# Patient Record
Sex: Male | Born: 1940 | Race: White | Hispanic: No | Marital: Single | Smoking: Former smoker
Health system: Southern US, Community
[De-identification: ages and names within clinical notes are randomized; demographics above are authoritative.]

## PROBLEM LIST (undated history)

## (undated) DIAGNOSIS — I6529 Occlusion and stenosis of unspecified carotid artery: Secondary | ICD-10-CM

## (undated) DIAGNOSIS — I252 Old myocardial infarction: Secondary | ICD-10-CM

## (undated) DIAGNOSIS — F32A Depression, unspecified: Secondary | ICD-10-CM

## (undated) DIAGNOSIS — I739 Peripheral vascular disease, unspecified: Secondary | ICD-10-CM

## (undated) DIAGNOSIS — I4891 Unspecified atrial fibrillation: Secondary | ICD-10-CM

## (undated) DIAGNOSIS — N183 Chronic kidney disease, stage 3 unspecified: Secondary | ICD-10-CM

## (undated) DIAGNOSIS — I2510 Atherosclerotic heart disease of native coronary artery without angina pectoris: Secondary | ICD-10-CM

## (undated) DIAGNOSIS — I82409 Acute embolism and thrombosis of unspecified deep veins of unspecified lower extremity: Secondary | ICD-10-CM

## (undated) DIAGNOSIS — G629 Polyneuropathy, unspecified: Secondary | ICD-10-CM

## (undated) DIAGNOSIS — M79673 Pain in unspecified foot: Secondary | ICD-10-CM

## (undated) DIAGNOSIS — Z720 Tobacco use: Secondary | ICD-10-CM

## (undated) DIAGNOSIS — Z862 Personal history of diseases of the blood and blood-forming organs and certain disorders involving the immune mechanism: Secondary | ICD-10-CM

## (undated) DIAGNOSIS — K635 Polyp of colon: Secondary | ICD-10-CM

## (undated) DIAGNOSIS — F329 Major depressive disorder, single episode, unspecified: Secondary | ICD-10-CM

## (undated) DIAGNOSIS — I10 Essential (primary) hypertension: Secondary | ICD-10-CM

## (undated) DIAGNOSIS — K8050 Calculus of bile duct without cholangitis or cholecystitis without obstruction: Secondary | ICD-10-CM

## (undated) DIAGNOSIS — J449 Chronic obstructive pulmonary disease, unspecified: Secondary | ICD-10-CM

## (undated) DIAGNOSIS — R001 Bradycardia, unspecified: Secondary | ICD-10-CM

## (undated) DIAGNOSIS — E785 Hyperlipidemia, unspecified: Secondary | ICD-10-CM

## (undated) DIAGNOSIS — N189 Chronic kidney disease, unspecified: Secondary | ICD-10-CM

## (undated) DIAGNOSIS — H269 Unspecified cataract: Secondary | ICD-10-CM

## (undated) DIAGNOSIS — K219 Gastro-esophageal reflux disease without esophagitis: Secondary | ICD-10-CM

## (undated) DIAGNOSIS — G709 Myoneural disorder, unspecified: Secondary | ICD-10-CM

## (undated) DIAGNOSIS — I639 Cerebral infarction, unspecified: Secondary | ICD-10-CM

## (undated) DIAGNOSIS — F419 Anxiety disorder, unspecified: Secondary | ICD-10-CM

## (undated) DIAGNOSIS — Z87438 Personal history of other diseases of male genital organs: Secondary | ICD-10-CM

## (undated) HISTORY — DX: Personal history of other diseases of male genital organs: Z87.438

## (undated) HISTORY — DX: Tobacco use: Z72.0

## (undated) HISTORY — DX: Cerebral infarction, unspecified: I63.9

## (undated) HISTORY — PX: OTHER SURGICAL HISTORY: SHX169

## (undated) HISTORY — DX: Essential (primary) hypertension: I10

## (undated) HISTORY — DX: Bradycardia, unspecified: R00.1

## (undated) HISTORY — DX: Calculus of bile duct without cholangitis or cholecystitis without obstruction: K80.50

## (undated) HISTORY — DX: Polyneuropathy, unspecified: G62.9

## (undated) HISTORY — DX: Polyp of colon: K63.5

## (undated) HISTORY — DX: Acute embolism and thrombosis of unspecified deep veins of unspecified lower extremity: I82.409

## (undated) HISTORY — DX: Major depressive disorder, single episode, unspecified: F32.9

## (undated) HISTORY — DX: Atherosclerotic heart disease of native coronary artery without angina pectoris: I25.10

## (undated) HISTORY — PX: CHOLECYSTECTOMY: SHX55

## (undated) HISTORY — DX: Unspecified atrial fibrillation: I48.91

## (undated) HISTORY — PX: COLONOSCOPY: SHX174

## (undated) HISTORY — DX: Myoneural disorder, unspecified: G70.9

## (undated) HISTORY — PX: BYPASS GRAFT: SHX909

## (undated) HISTORY — DX: Occlusion and stenosis of unspecified carotid artery: I65.29

## (undated) HISTORY — DX: Unspecified cataract: H26.9

## (undated) HISTORY — DX: Gastro-esophageal reflux disease without esophagitis: K21.9

## (undated) HISTORY — DX: Depression, unspecified: F32.A

## (undated) HISTORY — DX: Hyperlipidemia, unspecified: E78.5

## (undated) HISTORY — DX: Personal history of diseases of the blood and blood-forming organs and certain disorders involving the immune mechanism: Z86.2

## (undated) HISTORY — PX: ANKLE SURGERY: SHX546

## (undated) HISTORY — DX: Anxiety disorder, unspecified: F41.9

---

## 1997-06-05 ENCOUNTER — Encounter: Admission: RE | Admit: 1997-06-05 | Discharge: 1997-06-05 | Payer: Self-pay | Admitting: Family Medicine

## 1997-06-19 ENCOUNTER — Encounter: Admission: RE | Admit: 1997-06-19 | Discharge: 1997-06-19 | Payer: Self-pay | Admitting: Family Medicine

## 1997-07-03 ENCOUNTER — Encounter: Admission: RE | Admit: 1997-07-03 | Discharge: 1997-07-03 | Payer: Self-pay | Admitting: Sports Medicine

## 1997-07-11 ENCOUNTER — Encounter: Admission: RE | Admit: 1997-07-11 | Discharge: 1997-07-11 | Payer: Self-pay | Admitting: Family Medicine

## 1997-07-25 ENCOUNTER — Encounter: Admission: RE | Admit: 1997-07-25 | Discharge: 1997-07-25 | Payer: Self-pay | Admitting: Family Medicine

## 1997-08-08 ENCOUNTER — Encounter: Admission: RE | Admit: 1997-08-08 | Discharge: 1997-08-08 | Payer: Self-pay | Admitting: Family Medicine

## 1997-08-22 ENCOUNTER — Encounter: Admission: RE | Admit: 1997-08-22 | Discharge: 1997-08-22 | Payer: Self-pay | Admitting: Family Medicine

## 1997-08-31 ENCOUNTER — Encounter: Admission: RE | Admit: 1997-08-31 | Discharge: 1997-08-31 | Payer: Self-pay | Admitting: Family Medicine

## 1997-09-05 ENCOUNTER — Encounter: Admission: RE | Admit: 1997-09-05 | Discharge: 1997-09-05 | Payer: Self-pay | Admitting: Family Medicine

## 1997-09-12 ENCOUNTER — Encounter: Admission: RE | Admit: 1997-09-12 | Discharge: 1997-09-12 | Payer: Self-pay | Admitting: Family Medicine

## 1997-09-19 ENCOUNTER — Encounter: Admission: RE | Admit: 1997-09-19 | Discharge: 1997-09-19 | Payer: Self-pay | Admitting: Family Medicine

## 1997-10-03 ENCOUNTER — Encounter: Admission: RE | Admit: 1997-10-03 | Discharge: 1997-10-03 | Payer: Self-pay | Admitting: Family Medicine

## 1997-10-10 ENCOUNTER — Encounter: Admission: RE | Admit: 1997-10-10 | Discharge: 1997-10-10 | Payer: Self-pay | Admitting: Family Medicine

## 1997-10-17 ENCOUNTER — Encounter: Admission: RE | Admit: 1997-10-17 | Discharge: 1997-10-17 | Payer: Self-pay | Admitting: Family Medicine

## 1997-10-24 ENCOUNTER — Encounter: Admission: RE | Admit: 1997-10-24 | Discharge: 1997-10-24 | Payer: Self-pay | Admitting: Family Medicine

## 1997-10-31 ENCOUNTER — Encounter: Admission: RE | Admit: 1997-10-31 | Discharge: 1997-10-31 | Payer: Self-pay | Admitting: Family Medicine

## 1997-11-14 ENCOUNTER — Encounter: Admission: RE | Admit: 1997-11-14 | Discharge: 1997-11-14 | Payer: Self-pay | Admitting: Family Medicine

## 1997-11-28 ENCOUNTER — Encounter: Admission: RE | Admit: 1997-11-28 | Discharge: 1997-11-28 | Payer: Self-pay | Admitting: Family Medicine

## 1997-12-12 ENCOUNTER — Encounter: Admission: RE | Admit: 1997-12-12 | Discharge: 1997-12-12 | Payer: Self-pay | Admitting: Family Medicine

## 1997-12-19 ENCOUNTER — Encounter: Admission: RE | Admit: 1997-12-19 | Discharge: 1997-12-19 | Payer: Self-pay | Admitting: Family Medicine

## 1997-12-26 ENCOUNTER — Encounter: Admission: RE | Admit: 1997-12-26 | Discharge: 1997-12-26 | Payer: Self-pay | Admitting: Family Medicine

## 1998-01-01 ENCOUNTER — Encounter: Admission: RE | Admit: 1998-01-01 | Discharge: 1998-01-01 | Payer: Self-pay | Admitting: Family Medicine

## 1998-01-08 ENCOUNTER — Encounter: Admission: RE | Admit: 1998-01-08 | Discharge: 1998-01-08 | Payer: Self-pay | Admitting: Sports Medicine

## 1998-01-15 ENCOUNTER — Encounter: Admission: RE | Admit: 1998-01-15 | Discharge: 1998-01-15 | Payer: Self-pay | Admitting: Sports Medicine

## 1998-01-23 ENCOUNTER — Encounter: Admission: RE | Admit: 1998-01-23 | Discharge: 1998-01-23 | Payer: Self-pay | Admitting: Family Medicine

## 1998-01-30 ENCOUNTER — Encounter: Admission: RE | Admit: 1998-01-30 | Discharge: 1998-01-30 | Payer: Self-pay | Admitting: Family Medicine

## 1998-02-04 ENCOUNTER — Encounter: Admission: RE | Admit: 1998-02-04 | Discharge: 1998-02-04 | Payer: Self-pay | Admitting: Family Medicine

## 1998-02-11 ENCOUNTER — Encounter: Admission: RE | Admit: 1998-02-11 | Discharge: 1998-02-11 | Payer: Self-pay | Admitting: Sports Medicine

## 1998-03-04 ENCOUNTER — Encounter: Admission: RE | Admit: 1998-03-04 | Discharge: 1998-03-04 | Payer: Self-pay | Admitting: Family Medicine

## 1998-03-11 ENCOUNTER — Encounter: Admission: RE | Admit: 1998-03-11 | Discharge: 1998-03-11 | Payer: Self-pay | Admitting: Family Medicine

## 1998-03-26 ENCOUNTER — Encounter: Admission: RE | Admit: 1998-03-26 | Discharge: 1998-03-26 | Payer: Self-pay | Admitting: Family Medicine

## 1998-04-03 ENCOUNTER — Encounter: Admission: RE | Admit: 1998-04-03 | Discharge: 1998-04-03 | Payer: Self-pay | Admitting: Family Medicine

## 1998-04-17 ENCOUNTER — Encounter: Admission: RE | Admit: 1998-04-17 | Discharge: 1998-04-17 | Payer: Self-pay | Admitting: Family Medicine

## 1998-05-09 ENCOUNTER — Encounter: Admission: RE | Admit: 1998-05-09 | Discharge: 1998-05-09 | Payer: Self-pay | Admitting: Family Medicine

## 1998-05-29 ENCOUNTER — Encounter: Admission: RE | Admit: 1998-05-29 | Discharge: 1998-05-29 | Payer: Self-pay | Admitting: Family Medicine

## 1998-06-05 ENCOUNTER — Encounter: Admission: RE | Admit: 1998-06-05 | Discharge: 1998-06-05 | Payer: Self-pay | Admitting: Family Medicine

## 1998-06-24 ENCOUNTER — Encounter: Admission: RE | Admit: 1998-06-24 | Discharge: 1998-06-24 | Payer: Self-pay | Admitting: Family Medicine

## 1998-07-08 ENCOUNTER — Encounter: Admission: RE | Admit: 1998-07-08 | Discharge: 1998-07-08 | Payer: Self-pay | Admitting: Family Medicine

## 1998-07-29 ENCOUNTER — Encounter: Admission: RE | Admit: 1998-07-29 | Discharge: 1998-07-29 | Payer: Self-pay | Admitting: Family Medicine

## 1998-08-19 ENCOUNTER — Encounter: Admission: RE | Admit: 1998-08-19 | Discharge: 1998-08-19 | Payer: Self-pay | Admitting: Family Medicine

## 1998-09-02 ENCOUNTER — Encounter: Admission: RE | Admit: 1998-09-02 | Discharge: 1998-09-02 | Payer: Self-pay | Admitting: Family Medicine

## 1998-09-09 ENCOUNTER — Encounter: Admission: RE | Admit: 1998-09-09 | Discharge: 1998-09-09 | Payer: Self-pay | Admitting: Family Medicine

## 1998-09-12 ENCOUNTER — Ambulatory Visit (HOSPITAL_COMMUNITY): Admission: RE | Admit: 1998-09-12 | Discharge: 1998-09-12 | Payer: Self-pay | Admitting: *Deleted

## 1998-09-23 ENCOUNTER — Encounter: Admission: RE | Admit: 1998-09-23 | Discharge: 1998-09-23 | Payer: Self-pay | Admitting: Family Medicine

## 1998-10-08 ENCOUNTER — Encounter: Admission: RE | Admit: 1998-10-08 | Discharge: 1998-10-08 | Payer: Self-pay | Admitting: Sports Medicine

## 1998-10-14 ENCOUNTER — Encounter: Admission: RE | Admit: 1998-10-14 | Discharge: 1998-10-14 | Payer: Self-pay | Admitting: Family Medicine

## 1998-10-29 ENCOUNTER — Encounter: Admission: RE | Admit: 1998-10-29 | Discharge: 1998-10-29 | Payer: Self-pay | Admitting: Sports Medicine

## 1998-11-04 ENCOUNTER — Encounter: Admission: RE | Admit: 1998-11-04 | Discharge: 1998-11-04 | Payer: Self-pay | Admitting: Family Medicine

## 1998-11-12 ENCOUNTER — Encounter: Admission: RE | Admit: 1998-11-12 | Discharge: 1998-11-12 | Payer: Self-pay | Admitting: Sports Medicine

## 1998-11-15 ENCOUNTER — Encounter: Admission: RE | Admit: 1998-11-15 | Discharge: 1998-11-15 | Payer: Self-pay | Admitting: Family Medicine

## 1998-11-18 ENCOUNTER — Encounter: Admission: RE | Admit: 1998-11-18 | Discharge: 1998-11-18 | Payer: Self-pay | Admitting: Family Medicine

## 1998-11-22 ENCOUNTER — Encounter: Admission: RE | Admit: 1998-11-22 | Discharge: 1998-11-22 | Payer: Self-pay | Admitting: Family Medicine

## 1998-11-29 ENCOUNTER — Encounter: Admission: RE | Admit: 1998-11-29 | Discharge: 1998-11-29 | Payer: Self-pay | Admitting: Family Medicine

## 1998-12-20 ENCOUNTER — Encounter: Admission: RE | Admit: 1998-12-20 | Discharge: 1998-12-20 | Payer: Self-pay | Admitting: Sports Medicine

## 1998-12-23 ENCOUNTER — Encounter: Admission: RE | Admit: 1998-12-23 | Discharge: 1998-12-23 | Payer: Self-pay | Admitting: Family Medicine

## 1999-01-13 ENCOUNTER — Encounter: Admission: RE | Admit: 1999-01-13 | Discharge: 1999-01-13 | Payer: Self-pay | Admitting: Family Medicine

## 1999-01-20 ENCOUNTER — Encounter: Admission: RE | Admit: 1999-01-20 | Discharge: 1999-01-20 | Payer: Self-pay | Admitting: Family Medicine

## 1999-01-27 ENCOUNTER — Encounter: Admission: RE | Admit: 1999-01-27 | Discharge: 1999-01-27 | Payer: Self-pay | Admitting: Family Medicine

## 1999-02-11 ENCOUNTER — Encounter: Admission: RE | Admit: 1999-02-11 | Discharge: 1999-02-11 | Payer: Self-pay | Admitting: Sports Medicine

## 1999-03-03 ENCOUNTER — Encounter: Admission: RE | Admit: 1999-03-03 | Discharge: 1999-03-03 | Payer: Self-pay | Admitting: Family Medicine

## 1999-03-04 ENCOUNTER — Encounter: Admission: RE | Admit: 1999-03-04 | Discharge: 1999-03-04 | Payer: Self-pay | Admitting: Sports Medicine

## 1999-03-31 ENCOUNTER — Encounter: Admission: RE | Admit: 1999-03-31 | Discharge: 1999-03-31 | Payer: Self-pay | Admitting: Family Medicine

## 1999-04-28 ENCOUNTER — Encounter: Admission: RE | Admit: 1999-04-28 | Discharge: 1999-04-28 | Payer: Self-pay | Admitting: Family Medicine

## 1999-05-26 ENCOUNTER — Encounter: Admission: RE | Admit: 1999-05-26 | Discharge: 1999-05-26 | Payer: Self-pay | Admitting: Family Medicine

## 1999-06-02 ENCOUNTER — Encounter: Admission: RE | Admit: 1999-06-02 | Discharge: 1999-06-02 | Payer: Self-pay | Admitting: Family Medicine

## 1999-06-16 ENCOUNTER — Encounter: Admission: RE | Admit: 1999-06-16 | Discharge: 1999-06-16 | Payer: Self-pay | Admitting: Family Medicine

## 1999-06-18 ENCOUNTER — Encounter: Admission: RE | Admit: 1999-06-18 | Discharge: 1999-06-18 | Payer: Self-pay | Admitting: Family Medicine

## 1999-07-14 ENCOUNTER — Encounter: Admission: RE | Admit: 1999-07-14 | Discharge: 1999-07-14 | Payer: Self-pay | Admitting: Family Medicine

## 1999-07-23 ENCOUNTER — Ambulatory Visit: Admission: RE | Admit: 1999-07-23 | Discharge: 1999-07-23 | Payer: Self-pay | Admitting: Family Medicine

## 1999-07-23 ENCOUNTER — Encounter: Admission: RE | Admit: 1999-07-23 | Discharge: 1999-07-23 | Payer: Self-pay | Admitting: Family Medicine

## 1999-08-06 ENCOUNTER — Encounter: Admission: RE | Admit: 1999-08-06 | Discharge: 1999-08-06 | Payer: Self-pay | Admitting: Family Medicine

## 1999-08-22 ENCOUNTER — Encounter: Admission: RE | Admit: 1999-08-22 | Discharge: 1999-08-22 | Payer: Self-pay | Admitting: Family Medicine

## 1999-09-05 ENCOUNTER — Encounter: Admission: RE | Admit: 1999-09-05 | Discharge: 1999-09-05 | Payer: Self-pay | Admitting: Family Medicine

## 1999-09-23 ENCOUNTER — Encounter: Payer: Self-pay | Admitting: Family Medicine

## 1999-09-23 ENCOUNTER — Ambulatory Visit (HOSPITAL_COMMUNITY): Admission: RE | Admit: 1999-09-23 | Discharge: 1999-09-23 | Payer: Self-pay | Admitting: Family Medicine

## 1999-09-30 ENCOUNTER — Encounter: Admission: RE | Admit: 1999-09-30 | Discharge: 1999-09-30 | Payer: Self-pay | Admitting: Family Medicine

## 1999-10-29 ENCOUNTER — Encounter: Admission: RE | Admit: 1999-10-29 | Discharge: 1999-10-29 | Payer: Self-pay | Admitting: Family Medicine

## 1999-11-06 ENCOUNTER — Ambulatory Visit (HOSPITAL_COMMUNITY): Admission: RE | Admit: 1999-11-06 | Discharge: 1999-11-06 | Payer: Self-pay | Admitting: Orthopedic Surgery

## 1999-11-27 ENCOUNTER — Encounter: Admission: RE | Admit: 1999-11-27 | Discharge: 1999-11-27 | Payer: Self-pay | Admitting: Family Medicine

## 1999-12-04 ENCOUNTER — Encounter: Admission: RE | Admit: 1999-12-04 | Discharge: 1999-12-04 | Payer: Self-pay | Admitting: Family Medicine

## 2000-01-06 ENCOUNTER — Encounter: Admission: RE | Admit: 2000-01-06 | Discharge: 2000-01-06 | Payer: Self-pay | Admitting: Sports Medicine

## 2000-01-13 ENCOUNTER — Encounter: Admission: RE | Admit: 2000-01-13 | Discharge: 2000-01-13 | Payer: Self-pay | Admitting: Sports Medicine

## 2000-02-05 ENCOUNTER — Encounter: Admission: RE | Admit: 2000-02-05 | Discharge: 2000-02-05 | Payer: Self-pay | Admitting: Family Medicine

## 2000-02-19 ENCOUNTER — Encounter: Admission: RE | Admit: 2000-02-19 | Discharge: 2000-02-19 | Payer: Self-pay | Admitting: Family Medicine

## 2000-03-04 ENCOUNTER — Encounter: Admission: RE | Admit: 2000-03-04 | Discharge: 2000-03-04 | Payer: Self-pay | Admitting: Family Medicine

## 2000-03-18 ENCOUNTER — Encounter: Admission: RE | Admit: 2000-03-18 | Discharge: 2000-03-18 | Payer: Self-pay | Admitting: Family Medicine

## 2000-04-01 ENCOUNTER — Encounter: Admission: RE | Admit: 2000-04-01 | Discharge: 2000-04-01 | Payer: Self-pay | Admitting: Family Medicine

## 2000-04-13 ENCOUNTER — Encounter: Admission: RE | Admit: 2000-04-13 | Discharge: 2000-04-13 | Payer: Self-pay | Admitting: Family Medicine

## 2000-04-21 ENCOUNTER — Encounter: Admission: RE | Admit: 2000-04-21 | Discharge: 2000-04-21 | Payer: Self-pay | Admitting: Family Medicine

## 2000-04-26 ENCOUNTER — Encounter: Admission: RE | Admit: 2000-04-26 | Discharge: 2000-04-26 | Payer: Self-pay | Admitting: Family Medicine

## 2000-05-04 ENCOUNTER — Encounter: Admission: RE | Admit: 2000-05-04 | Discharge: 2000-05-04 | Payer: Self-pay | Admitting: Family Medicine

## 2000-05-12 ENCOUNTER — Encounter: Admission: RE | Admit: 2000-05-12 | Discharge: 2000-05-12 | Payer: Self-pay | Admitting: Family Medicine

## 2000-05-20 ENCOUNTER — Encounter: Admission: RE | Admit: 2000-05-20 | Discharge: 2000-05-20 | Payer: Self-pay | Admitting: Family Medicine

## 2000-05-27 ENCOUNTER — Encounter: Admission: RE | Admit: 2000-05-27 | Discharge: 2000-05-27 | Payer: Self-pay | Admitting: Family Medicine

## 2000-06-02 ENCOUNTER — Encounter: Admission: RE | Admit: 2000-06-02 | Discharge: 2000-06-02 | Payer: Self-pay | Admitting: Family Medicine

## 2000-06-08 ENCOUNTER — Encounter: Admission: RE | Admit: 2000-06-08 | Discharge: 2000-06-08 | Payer: Self-pay | Admitting: Sports Medicine

## 2000-06-09 ENCOUNTER — Encounter: Payer: Self-pay | Admitting: Emergency Medicine

## 2000-06-09 ENCOUNTER — Emergency Department (HOSPITAL_COMMUNITY): Admission: EM | Admit: 2000-06-09 | Discharge: 2000-06-09 | Payer: Self-pay | Admitting: Emergency Medicine

## 2000-06-14 ENCOUNTER — Encounter: Admission: RE | Admit: 2000-06-14 | Discharge: 2000-06-14 | Payer: Self-pay | Admitting: Family Medicine

## 2000-06-14 ENCOUNTER — Encounter: Payer: Self-pay | Admitting: Sports Medicine

## 2000-06-22 ENCOUNTER — Encounter: Admission: RE | Admit: 2000-06-22 | Discharge: 2000-06-22 | Payer: Self-pay | Admitting: Sports Medicine

## 2000-07-02 ENCOUNTER — Encounter: Admission: RE | Admit: 2000-07-02 | Discharge: 2000-07-02 | Payer: Self-pay | Admitting: Family Medicine

## 2000-07-14 ENCOUNTER — Encounter: Admission: RE | Admit: 2000-07-14 | Discharge: 2000-07-14 | Payer: Self-pay | Admitting: Family Medicine

## 2000-08-02 ENCOUNTER — Encounter: Admission: RE | Admit: 2000-08-02 | Discharge: 2000-08-02 | Payer: Self-pay | Admitting: Sports Medicine

## 2000-08-16 ENCOUNTER — Encounter: Admission: RE | Admit: 2000-08-16 | Discharge: 2000-08-16 | Payer: Self-pay | Admitting: Family Medicine

## 2000-08-23 ENCOUNTER — Encounter: Admission: RE | Admit: 2000-08-23 | Discharge: 2000-08-23 | Payer: Self-pay | Admitting: Family Medicine

## 2000-08-31 ENCOUNTER — Encounter: Admission: RE | Admit: 2000-08-31 | Discharge: 2000-08-31 | Payer: Self-pay | Admitting: Family Medicine

## 2000-10-04 ENCOUNTER — Encounter: Admission: RE | Admit: 2000-10-04 | Discharge: 2000-10-04 | Payer: Self-pay | Admitting: Family Medicine

## 2000-10-18 ENCOUNTER — Encounter: Admission: RE | Admit: 2000-10-18 | Discharge: 2000-10-18 | Payer: Self-pay | Admitting: Family Medicine

## 2000-11-01 ENCOUNTER — Encounter: Admission: RE | Admit: 2000-11-01 | Discharge: 2000-11-01 | Payer: Self-pay | Admitting: Family Medicine

## 2000-11-17 ENCOUNTER — Encounter: Admission: RE | Admit: 2000-11-17 | Discharge: 2000-11-17 | Payer: Self-pay | Admitting: Family Medicine

## 2000-12-01 ENCOUNTER — Encounter: Admission: RE | Admit: 2000-12-01 | Discharge: 2000-12-01 | Payer: Self-pay | Admitting: Family Medicine

## 2001-01-04 ENCOUNTER — Encounter: Admission: RE | Admit: 2001-01-04 | Discharge: 2001-01-04 | Payer: Self-pay | Admitting: Family Medicine

## 2001-03-09 ENCOUNTER — Encounter: Admission: RE | Admit: 2001-03-09 | Discharge: 2001-03-09 | Payer: Self-pay | Admitting: Family Medicine

## 2001-04-27 ENCOUNTER — Encounter: Admission: RE | Admit: 2001-04-27 | Discharge: 2001-04-27 | Payer: Self-pay | Admitting: Family Medicine

## 2001-04-28 ENCOUNTER — Encounter: Payer: Self-pay | Admitting: Sports Medicine

## 2001-04-28 ENCOUNTER — Encounter: Admission: RE | Admit: 2001-04-28 | Discharge: 2001-04-28 | Payer: Self-pay | Admitting: Sports Medicine

## 2001-05-05 ENCOUNTER — Encounter: Admission: RE | Admit: 2001-05-05 | Discharge: 2001-05-05 | Payer: Self-pay | Admitting: Family Medicine

## 2001-05-18 ENCOUNTER — Encounter: Admission: RE | Admit: 2001-05-18 | Discharge: 2001-05-18 | Payer: Self-pay | Admitting: Sports Medicine

## 2001-05-18 ENCOUNTER — Encounter: Payer: Self-pay | Admitting: Sports Medicine

## 2001-05-18 ENCOUNTER — Encounter: Admission: RE | Admit: 2001-05-18 | Discharge: 2001-05-18 | Payer: Self-pay | Admitting: Family Medicine

## 2001-06-01 ENCOUNTER — Encounter: Admission: RE | Admit: 2001-06-01 | Discharge: 2001-06-01 | Payer: Self-pay | Admitting: Family Medicine

## 2001-07-05 ENCOUNTER — Encounter: Admission: RE | Admit: 2001-07-05 | Discharge: 2001-07-05 | Payer: Self-pay | Admitting: Family Medicine

## 2001-07-13 ENCOUNTER — Encounter (INDEPENDENT_AMBULATORY_CARE_PROVIDER_SITE_OTHER): Payer: Self-pay | Admitting: *Deleted

## 2001-07-13 ENCOUNTER — Ambulatory Visit (HOSPITAL_COMMUNITY): Admission: RE | Admit: 2001-07-13 | Discharge: 2001-07-13 | Payer: Self-pay | Admitting: Gastroenterology

## 2001-07-28 ENCOUNTER — Encounter: Admission: RE | Admit: 2001-07-28 | Discharge: 2001-07-28 | Payer: Self-pay | Admitting: Family Medicine

## 2001-08-29 ENCOUNTER — Encounter: Admission: RE | Admit: 2001-08-29 | Discharge: 2001-08-29 | Payer: Self-pay | Admitting: Pediatrics

## 2001-09-28 ENCOUNTER — Encounter: Admission: RE | Admit: 2001-09-28 | Discharge: 2001-09-28 | Payer: Self-pay | Admitting: Family Medicine

## 2001-11-04 ENCOUNTER — Encounter: Admission: RE | Admit: 2001-11-04 | Discharge: 2001-11-04 | Payer: Self-pay | Admitting: Family Medicine

## 2001-12-16 ENCOUNTER — Encounter: Admission: RE | Admit: 2001-12-16 | Discharge: 2001-12-16 | Payer: Self-pay | Admitting: Family Medicine

## 2002-02-27 ENCOUNTER — Encounter: Admission: RE | Admit: 2002-02-27 | Discharge: 2002-02-27 | Payer: Self-pay | Admitting: Family Medicine

## 2002-03-08 ENCOUNTER — Encounter: Payer: Self-pay | Admitting: Emergency Medicine

## 2002-03-08 ENCOUNTER — Encounter: Payer: Self-pay | Admitting: Family Medicine

## 2002-03-08 ENCOUNTER — Inpatient Hospital Stay (HOSPITAL_COMMUNITY): Admission: EM | Admit: 2002-03-08 | Discharge: 2002-03-09 | Payer: Self-pay | Admitting: Emergency Medicine

## 2002-03-10 ENCOUNTER — Emergency Department (HOSPITAL_COMMUNITY): Admission: EM | Admit: 2002-03-10 | Discharge: 2002-03-10 | Payer: Self-pay | Admitting: Emergency Medicine

## 2002-03-17 ENCOUNTER — Encounter: Admission: RE | Admit: 2002-03-17 | Discharge: 2002-03-17 | Payer: Self-pay | Admitting: Family Medicine

## 2002-03-27 ENCOUNTER — Encounter: Payer: Self-pay | Admitting: Family Medicine

## 2002-03-27 ENCOUNTER — Ambulatory Visit (HOSPITAL_COMMUNITY): Admission: RE | Admit: 2002-03-27 | Discharge: 2002-03-27 | Payer: Self-pay | Admitting: Family Medicine

## 2002-04-03 ENCOUNTER — Encounter: Admission: RE | Admit: 2002-04-03 | Discharge: 2002-04-03 | Payer: Self-pay | Admitting: Family Medicine

## 2002-05-03 ENCOUNTER — Encounter: Admission: RE | Admit: 2002-05-03 | Discharge: 2002-05-03 | Payer: Self-pay | Admitting: Family Medicine

## 2002-05-10 ENCOUNTER — Encounter: Admission: RE | Admit: 2002-05-10 | Discharge: 2002-05-10 | Payer: Self-pay | Admitting: Family Medicine

## 2002-06-19 ENCOUNTER — Encounter: Admission: RE | Admit: 2002-06-19 | Discharge: 2002-06-19 | Payer: Self-pay | Admitting: Family Medicine

## 2002-06-19 ENCOUNTER — Ambulatory Visit (HOSPITAL_COMMUNITY): Admission: RE | Admit: 2002-06-19 | Discharge: 2002-06-19 | Payer: Self-pay | Admitting: Family Medicine

## 2002-07-20 ENCOUNTER — Encounter: Admission: RE | Admit: 2002-07-20 | Discharge: 2002-07-20 | Payer: Self-pay | Admitting: Family Medicine

## 2002-08-14 ENCOUNTER — Encounter: Admission: RE | Admit: 2002-08-14 | Discharge: 2002-08-14 | Payer: Self-pay | Admitting: Family Medicine

## 2002-09-22 ENCOUNTER — Encounter: Admission: RE | Admit: 2002-09-22 | Discharge: 2002-09-22 | Payer: Self-pay | Admitting: Family Medicine

## 2002-11-06 ENCOUNTER — Encounter: Admission: RE | Admit: 2002-11-06 | Discharge: 2002-11-06 | Payer: Self-pay | Admitting: Family Medicine

## 2002-12-28 ENCOUNTER — Encounter: Admission: RE | Admit: 2002-12-28 | Discharge: 2002-12-28 | Payer: Self-pay | Admitting: Sports Medicine

## 2003-10-25 ENCOUNTER — Ambulatory Visit (HOSPITAL_COMMUNITY): Admission: RE | Admit: 2003-10-25 | Discharge: 2003-10-25 | Payer: Self-pay | Admitting: Family Medicine

## 2003-11-01 ENCOUNTER — Ambulatory Visit (HOSPITAL_COMMUNITY): Admission: RE | Admit: 2003-11-01 | Discharge: 2003-11-01 | Payer: Self-pay | Admitting: Family Medicine

## 2004-01-04 ENCOUNTER — Ambulatory Visit (HOSPITAL_COMMUNITY): Admission: RE | Admit: 2004-01-04 | Discharge: 2004-01-04 | Payer: Self-pay | Admitting: Gastroenterology

## 2004-02-19 ENCOUNTER — Encounter: Admission: RE | Admit: 2004-02-19 | Discharge: 2004-05-19 | Payer: Self-pay | Admitting: Internal Medicine

## 2004-09-05 ENCOUNTER — Ambulatory Visit (HOSPITAL_COMMUNITY): Admission: RE | Admit: 2004-09-05 | Discharge: 2004-09-05 | Payer: Self-pay | Admitting: Internal Medicine

## 2005-08-18 ENCOUNTER — Encounter: Admission: RE | Admit: 2005-08-18 | Discharge: 2005-08-18 | Payer: Self-pay | Admitting: Otolaryngology

## 2005-11-28 ENCOUNTER — Emergency Department (HOSPITAL_COMMUNITY): Admission: EM | Admit: 2005-11-28 | Discharge: 2005-11-29 | Payer: Self-pay | Admitting: Emergency Medicine

## 2005-12-09 ENCOUNTER — Emergency Department (HOSPITAL_COMMUNITY): Admission: EM | Admit: 2005-12-09 | Discharge: 2005-12-09 | Payer: Self-pay | Admitting: *Deleted

## 2005-12-09 ENCOUNTER — Emergency Department (HOSPITAL_COMMUNITY): Admission: EM | Admit: 2005-12-09 | Discharge: 2005-12-09 | Payer: Self-pay | Admitting: Emergency Medicine

## 2005-12-29 ENCOUNTER — Ambulatory Visit (HOSPITAL_COMMUNITY): Admission: RE | Admit: 2005-12-29 | Discharge: 2005-12-29 | Payer: Self-pay | Admitting: Internal Medicine

## 2006-01-15 ENCOUNTER — Emergency Department (HOSPITAL_COMMUNITY): Admission: EM | Admit: 2006-01-15 | Discharge: 2006-01-16 | Payer: Self-pay | Admitting: Emergency Medicine

## 2006-01-17 ENCOUNTER — Emergency Department (HOSPITAL_COMMUNITY): Admission: EM | Admit: 2006-01-17 | Discharge: 2006-01-17 | Payer: Self-pay | Admitting: Emergency Medicine

## 2006-01-29 ENCOUNTER — Emergency Department (HOSPITAL_COMMUNITY): Admission: EM | Admit: 2006-01-29 | Discharge: 2006-01-30 | Payer: Self-pay | Admitting: Emergency Medicine

## 2006-02-06 ENCOUNTER — Ambulatory Visit: Payer: Self-pay | Admitting: Oncology

## 2006-03-02 ENCOUNTER — Other Ambulatory Visit: Admission: RE | Admit: 2006-03-02 | Discharge: 2006-03-02 | Payer: Self-pay | Admitting: Oncology

## 2006-03-02 ENCOUNTER — Encounter: Payer: Self-pay | Admitting: Oncology

## 2006-03-02 LAB — CBC WITH DIFFERENTIAL/PLATELET
BASO%: 0.4 % (ref 0.0–2.0)
Basophils Absolute: 0.1 10*3/uL (ref 0.0–0.1)
EOS%: 2.9 % (ref 0.0–7.0)
Eosinophils Absolute: 0.4 10*3/uL (ref 0.0–0.5)
HCT: 42.3 % (ref 38.7–49.9)
HGB: 14.1 g/dL (ref 13.0–17.1)
LYMPH%: 15.4 % (ref 14.0–48.0)
MCH: 30 pg (ref 28.0–33.4)
MCHC: 33.4 g/dL (ref 32.0–35.9)
MCV: 89.8 fL (ref 81.6–98.0)
MONO#: 0.9 10*3/uL (ref 0.1–0.9)
MONO%: 6.8 % (ref 0.0–13.0)
NEUT#: 9.4 10*3/uL — ABNORMAL HIGH (ref 1.5–6.5)
NEUT%: 74.5 % (ref 40.0–75.0)
Platelets: 279 10*3/uL (ref 145–400)
RBC: 4.71 10*6/uL (ref 4.20–5.71)
RDW: 14 % (ref 11.2–14.6)
WBC: 12.6 10*3/uL — ABNORMAL HIGH (ref 4.0–10.0)
lymph#: 1.9 10*3/uL (ref 0.9–3.3)

## 2006-03-02 LAB — CHCC SMEAR

## 2006-03-05 LAB — COMPREHENSIVE METABOLIC PANEL
ALT: 19 U/L (ref 0–53)
AST: 13 U/L (ref 0–37)
Albumin: 3.9 g/dL (ref 3.5–5.2)
Alkaline Phosphatase: 64 U/L (ref 39–117)
BUN: 25 mg/dL — ABNORMAL HIGH (ref 6–23)
CO2: 28 mEq/L (ref 19–32)
Calcium: 10 mg/dL (ref 8.4–10.5)
Chloride: 104 mEq/L (ref 96–112)
Creatinine, Ser: 1.27 mg/dL (ref 0.40–1.50)
Glucose, Bld: 93 mg/dL (ref 70–99)
Potassium: 4.1 mEq/L (ref 3.5–5.3)
Sodium: 141 mEq/L (ref 135–145)
Total Bilirubin: 0.4 mg/dL (ref 0.3–1.2)
Total Protein: 6.7 g/dL (ref 6.0–8.3)

## 2006-03-05 LAB — JAK2 GENOTYPR

## 2006-03-05 LAB — LACTATE DEHYDROGENASE: LDH: 62 U/L — ABNORMAL LOW (ref 94–250)

## 2006-03-05 LAB — BCR/ABL GENE REARRANGEMENT QNT, PCR

## 2006-03-05 LAB — BCR/ABL QUALITATIVE: BCR/ABL t(9,22): NEGATIVE

## 2006-03-10 LAB — FLOW CYTOMETRY

## 2006-03-31 ENCOUNTER — Ambulatory Visit: Payer: Self-pay | Admitting: Oncology

## 2006-04-23 ENCOUNTER — Ambulatory Visit: Payer: Self-pay | Admitting: Cardiology

## 2006-04-23 ENCOUNTER — Inpatient Hospital Stay (HOSPITAL_COMMUNITY): Admission: EM | Admit: 2006-04-23 | Discharge: 2006-04-25 | Payer: Self-pay | Admitting: Emergency Medicine

## 2006-04-23 ENCOUNTER — Encounter: Payer: Self-pay | Admitting: Cardiology

## 2006-06-14 ENCOUNTER — Ambulatory Visit (HOSPITAL_COMMUNITY): Admission: RE | Admit: 2006-06-14 | Discharge: 2006-06-15 | Payer: Self-pay | Admitting: Surgery

## 2006-06-14 ENCOUNTER — Encounter (INDEPENDENT_AMBULATORY_CARE_PROVIDER_SITE_OTHER): Payer: Self-pay | Admitting: Specialist

## 2006-08-01 ENCOUNTER — Ambulatory Visit: Payer: Self-pay | Admitting: Oncology

## 2006-08-04 LAB — COMPREHENSIVE METABOLIC PANEL
ALT: 14 U/L (ref 0–53)
AST: 15 U/L (ref 0–37)
Albumin: 4.1 g/dL (ref 3.5–5.2)
Alkaline Phosphatase: 61 U/L (ref 39–117)
BUN: 23 mg/dL (ref 6–23)
CO2: 25 mEq/L (ref 19–32)
Calcium: 9.5 mg/dL (ref 8.4–10.5)
Chloride: 105 mEq/L (ref 96–112)
Creatinine, Ser: 1.28 mg/dL (ref 0.40–1.50)
Glucose, Bld: 102 mg/dL — ABNORMAL HIGH (ref 70–99)
Potassium: 4.1 mEq/L (ref 3.5–5.3)
Sodium: 140 mEq/L (ref 135–145)
Total Bilirubin: 0.5 mg/dL (ref 0.3–1.2)
Total Protein: 7 g/dL (ref 6.0–8.3)

## 2006-08-04 LAB — CBC WITH DIFFERENTIAL/PLATELET
BASO%: 1 % (ref 0.0–2.0)
Basophils Absolute: 0.1 10*3/uL (ref 0.0–0.1)
EOS%: 3.1 % (ref 0.0–7.0)
Eosinophils Absolute: 0.3 10*3/uL (ref 0.0–0.5)
HCT: 41.4 % (ref 38.7–49.9)
HGB: 14.2 g/dL (ref 13.0–17.1)
LYMPH%: 19.7 % (ref 14.0–48.0)
MCH: 29.9 pg (ref 28.0–33.4)
MCHC: 34.2 g/dL (ref 32.0–35.9)
MCV: 87.5 fL (ref 81.6–98.0)
MONO#: 0.8 10*3/uL (ref 0.1–0.9)
MONO%: 7.2 % (ref 0.0–13.0)
NEUT#: 7.3 10*3/uL — ABNORMAL HIGH (ref 1.5–6.5)
NEUT%: 69 % (ref 40.0–75.0)
Platelets: 224 10*3/uL (ref 145–400)
RBC: 4.74 10*6/uL (ref 4.20–5.71)
RDW: 14.1 % (ref 11.2–14.6)
WBC: 10.6 10*3/uL — ABNORMAL HIGH (ref 4.0–10.0)
lymph#: 2.1 10*3/uL (ref 0.9–3.3)

## 2006-08-04 LAB — LACTATE DEHYDROGENASE: LDH: 116 U/L (ref 94–250)

## 2006-10-07 ENCOUNTER — Encounter: Admission: RE | Admit: 2006-10-07 | Discharge: 2006-10-07 | Payer: Self-pay | Admitting: Internal Medicine

## 2007-01-31 ENCOUNTER — Ambulatory Visit: Payer: Self-pay | Admitting: Oncology

## 2007-08-17 ENCOUNTER — Ambulatory Visit: Payer: Self-pay | Admitting: Vascular Surgery

## 2008-08-15 ENCOUNTER — Ambulatory Visit: Payer: Self-pay | Admitting: Cardiovascular Disease

## 2008-08-15 ENCOUNTER — Ambulatory Visit: Payer: Self-pay | Admitting: Cardiology

## 2008-08-15 ENCOUNTER — Inpatient Hospital Stay (HOSPITAL_COMMUNITY): Admission: EM | Admit: 2008-08-15 | Discharge: 2008-08-18 | Payer: Self-pay | Admitting: Emergency Medicine

## 2008-08-16 ENCOUNTER — Encounter: Payer: Self-pay | Admitting: Cardiovascular Disease

## 2008-08-17 ENCOUNTER — Encounter: Payer: Self-pay | Admitting: Cardiovascular Disease

## 2008-09-03 DIAGNOSIS — I6350 Cerebral infarction due to unspecified occlusion or stenosis of unspecified cerebral artery: Secondary | ICD-10-CM | POA: Insufficient documentation

## 2008-09-03 DIAGNOSIS — G589 Mononeuropathy, unspecified: Secondary | ICD-10-CM | POA: Insufficient documentation

## 2008-09-03 DIAGNOSIS — F17200 Nicotine dependence, unspecified, uncomplicated: Secondary | ICD-10-CM | POA: Insufficient documentation

## 2008-09-03 DIAGNOSIS — F411 Generalized anxiety disorder: Secondary | ICD-10-CM | POA: Insufficient documentation

## 2008-09-03 DIAGNOSIS — I498 Other specified cardiac arrhythmias: Secondary | ICD-10-CM | POA: Insufficient documentation

## 2008-09-03 DIAGNOSIS — K219 Gastro-esophageal reflux disease without esophagitis: Secondary | ICD-10-CM | POA: Insufficient documentation

## 2008-09-03 DIAGNOSIS — E785 Hyperlipidemia, unspecified: Secondary | ICD-10-CM | POA: Insufficient documentation

## 2008-09-03 DIAGNOSIS — N400 Enlarged prostate without lower urinary tract symptoms: Secondary | ICD-10-CM | POA: Insufficient documentation

## 2008-09-03 DIAGNOSIS — D72829 Elevated white blood cell count, unspecified: Secondary | ICD-10-CM | POA: Insufficient documentation

## 2008-09-03 DIAGNOSIS — I2510 Atherosclerotic heart disease of native coronary artery without angina pectoris: Secondary | ICD-10-CM | POA: Insufficient documentation

## 2008-09-03 DIAGNOSIS — I10 Essential (primary) hypertension: Secondary | ICD-10-CM | POA: Insufficient documentation

## 2008-09-05 ENCOUNTER — Ambulatory Visit: Payer: Self-pay | Admitting: Internal Medicine

## 2008-09-05 ENCOUNTER — Encounter: Payer: Self-pay | Admitting: Physician Assistant

## 2008-11-19 ENCOUNTER — Ambulatory Visit: Payer: Self-pay | Admitting: Cardiovascular Disease

## 2008-11-26 ENCOUNTER — Telehealth (INDEPENDENT_AMBULATORY_CARE_PROVIDER_SITE_OTHER): Payer: Self-pay

## 2008-11-27 ENCOUNTER — Ambulatory Visit: Payer: Self-pay | Admitting: Cardiology

## 2008-11-27 ENCOUNTER — Ambulatory Visit: Payer: Self-pay

## 2008-11-27 ENCOUNTER — Encounter (HOSPITAL_COMMUNITY): Admission: RE | Admit: 2008-11-27 | Discharge: 2009-01-25 | Payer: Self-pay | Admitting: Cardiology

## 2008-12-14 ENCOUNTER — Encounter: Payer: Self-pay | Admitting: Cardiovascular Disease

## 2009-01-30 ENCOUNTER — Ambulatory Visit: Payer: Self-pay | Admitting: Vascular Surgery

## 2009-02-21 ENCOUNTER — Ambulatory Visit: Payer: Self-pay | Admitting: Vascular Surgery

## 2009-02-21 ENCOUNTER — Encounter: Admission: RE | Admit: 2009-02-21 | Discharge: 2009-02-21 | Payer: Self-pay | Admitting: Vascular Surgery

## 2009-03-21 ENCOUNTER — Encounter: Payer: Self-pay | Admitting: Emergency Medicine

## 2009-03-21 ENCOUNTER — Ambulatory Visit: Payer: Self-pay | Admitting: Diagnostic Radiology

## 2009-03-22 ENCOUNTER — Inpatient Hospital Stay (HOSPITAL_COMMUNITY): Admission: EM | Admit: 2009-03-22 | Discharge: 2009-03-23 | Payer: Self-pay | Admitting: Emergency Medicine

## 2009-03-23 ENCOUNTER — Ambulatory Visit: Payer: Self-pay | Admitting: Gastroenterology

## 2009-04-01 ENCOUNTER — Telehealth: Payer: Self-pay | Admitting: Internal Medicine

## 2009-04-22 ENCOUNTER — Inpatient Hospital Stay (HOSPITAL_COMMUNITY): Admission: EM | Admit: 2009-04-22 | Discharge: 2009-04-23 | Payer: Self-pay | Admitting: Emergency Medicine

## 2009-04-22 ENCOUNTER — Ambulatory Visit: Payer: Self-pay | Admitting: Internal Medicine

## 2009-04-22 ENCOUNTER — Telehealth: Payer: Self-pay | Admitting: Cardiovascular Disease

## 2009-04-23 ENCOUNTER — Ambulatory Visit: Payer: Self-pay | Admitting: Surgery

## 2009-05-15 ENCOUNTER — Ambulatory Visit: Payer: Self-pay | Admitting: Vascular Surgery

## 2009-05-30 ENCOUNTER — Ambulatory Visit: Payer: Self-pay | Admitting: Cardiovascular Disease

## 2009-05-31 ENCOUNTER — Ambulatory Visit: Payer: Self-pay | Admitting: Vascular Surgery

## 2009-05-31 ENCOUNTER — Ambulatory Visit (HOSPITAL_COMMUNITY): Admission: RE | Admit: 2009-05-31 | Discharge: 2009-05-31 | Payer: Self-pay | Admitting: Vascular Surgery

## 2009-12-20 ENCOUNTER — Ambulatory Visit: Payer: Self-pay | Admitting: Cardiovascular Disease

## 2009-12-20 ENCOUNTER — Encounter: Payer: Self-pay | Admitting: Internal Medicine

## 2009-12-26 ENCOUNTER — Telehealth: Payer: Self-pay | Admitting: Internal Medicine

## 2009-12-27 ENCOUNTER — Ambulatory Visit: Payer: Self-pay | Admitting: Vascular Surgery

## 2010-03-25 ENCOUNTER — Inpatient Hospital Stay (HOSPITAL_COMMUNITY)
Admission: EM | Admit: 2010-03-25 | Discharge: 2010-03-26 | DRG: 313 | Disposition: A | Discharge status: Home / Self Care | Payer: Medicare Other | Attending: Cardiology | Admitting: Cardiology

## 2010-03-25 ENCOUNTER — Telehealth: Payer: Self-pay | Admitting: Cardiovascular Disease

## 2010-03-25 DIAGNOSIS — I2510 Atherosclerotic heart disease of native coronary artery without angina pectoris: Secondary | ICD-10-CM | POA: Diagnosis present

## 2010-03-25 DIAGNOSIS — I739 Peripheral vascular disease, unspecified: Secondary | ICD-10-CM | POA: Diagnosis present

## 2010-03-25 DIAGNOSIS — N400 Enlarged prostate without lower urinary tract symptoms: Secondary | ICD-10-CM | POA: Diagnosis present

## 2010-03-25 DIAGNOSIS — I129 Hypertensive chronic kidney disease with stage 1 through stage 4 chronic kidney disease, or unspecified chronic kidney disease: Secondary | ICD-10-CM | POA: Diagnosis present

## 2010-03-25 DIAGNOSIS — G609 Hereditary and idiopathic neuropathy, unspecified: Secondary | ICD-10-CM | POA: Diagnosis present

## 2010-03-25 DIAGNOSIS — E785 Hyperlipidemia, unspecified: Secondary | ICD-10-CM | POA: Diagnosis present

## 2010-03-25 DIAGNOSIS — I252 Old myocardial infarction: Secondary | ICD-10-CM

## 2010-03-25 DIAGNOSIS — R0789 Other chest pain: Principal | ICD-10-CM | POA: Diagnosis present

## 2010-03-25 DIAGNOSIS — F17200 Nicotine dependence, unspecified, uncomplicated: Secondary | ICD-10-CM | POA: Diagnosis present

## 2010-03-25 DIAGNOSIS — N183 Chronic kidney disease, stage 3 unspecified: Secondary | ICD-10-CM | POA: Diagnosis present

## 2010-03-25 DIAGNOSIS — Z7982 Long term (current) use of aspirin: Secondary | ICD-10-CM

## 2010-03-25 DIAGNOSIS — K219 Gastro-esophageal reflux disease without esophagitis: Secondary | ICD-10-CM | POA: Diagnosis present

## 2010-03-25 DIAGNOSIS — R072 Precordial pain: Secondary | ICD-10-CM

## 2010-03-25 DIAGNOSIS — Z7902 Long term (current) use of antithrombotics/antiplatelets: Secondary | ICD-10-CM

## 2010-03-25 DIAGNOSIS — F411 Generalized anxiety disorder: Secondary | ICD-10-CM | POA: Diagnosis present

## 2010-03-25 DIAGNOSIS — Z8673 Personal history of transient ischemic attack (TIA), and cerebral infarction without residual deficits: Secondary | ICD-10-CM

## 2010-03-25 LAB — MAGNESIUM: Magnesium: 2.1 mg/dL (ref 1.5–2.5)

## 2010-03-25 LAB — CBC
HCT: 41.2 % (ref 39.0–52.0)
Hemoglobin: 14.1 g/dL (ref 13.0–17.0)
MCH: 29.2 pg (ref 26.0–34.0)
MCHC: 34.2 g/dL (ref 30.0–36.0)
MCV: 85.3 fL (ref 78.0–100.0)
Platelets: 198 10*3/uL (ref 150–400)
RBC: 4.83 MIL/uL (ref 4.22–5.81)
RDW: 14.3 % (ref 11.5–15.5)
WBC: 10.9 10*3/uL — ABNORMAL HIGH (ref 4.0–10.5)

## 2010-03-25 LAB — DIFFERENTIAL
Basophils Absolute: 0.1 10*3/uL (ref 0.0–0.1)
Basophils Relative: 1 % (ref 0–1)
Eosinophils Absolute: 0.2 10*3/uL (ref 0.0–0.7)
Eosinophils Relative: 2 % (ref 0–5)
Lymphocytes Relative: 14 % (ref 12–46)
Lymphs Abs: 1.6 10*3/uL (ref 0.7–4.0)
Monocytes Absolute: 1 10*3/uL (ref 0.1–1.0)
Monocytes Relative: 9 % (ref 3–12)
Neutro Abs: 8.1 10*3/uL — ABNORMAL HIGH (ref 1.7–7.7)
Neutrophils Relative %: 74 % (ref 43–77)

## 2010-03-25 LAB — COMPREHENSIVE METABOLIC PANEL
ALT: 18 U/L (ref 0–53)
AST: 19 U/L (ref 0–37)
Albumin: 3.3 g/dL — ABNORMAL LOW (ref 3.5–5.2)
Alkaline Phosphatase: 58 U/L (ref 39–117)
BUN: 10 mg/dL (ref 6–23)
CO2: 27 mEq/L (ref 19–32)
Calcium: 9.2 mg/dL (ref 8.4–10.5)
Chloride: 103 mEq/L (ref 96–112)
Creatinine, Ser: 1.61 mg/dL — ABNORMAL HIGH (ref 0.4–1.5)
GFR calc Af Amer: 51 mL/min — ABNORMAL LOW (ref >60)
GFR calc non Af Amer: 43 mL/min — ABNORMAL LOW (ref >60)
Glucose, Bld: 148 mg/dL — ABNORMAL HIGH (ref 70–99)
Potassium: 3.9 mEq/L (ref 3.5–5.1)
Sodium: 137 mEq/L (ref 135–145)
Total Bilirubin: 0.5 mg/dL (ref 0.3–1.2)
Total Protein: 6.4 g/dL (ref 6.0–8.3)

## 2010-03-25 LAB — POCT I-STAT, CHEM 8
BUN: 16 mg/dL (ref 6–23)
Calcium, Ion: 1.12 mmol/L (ref 1.12–1.32)
Chloride: 107 mEq/L (ref 96–112)
Creatinine, Ser: 1.6 mg/dL — ABNORMAL HIGH (ref 0.4–1.5)
Glucose, Bld: 92 mg/dL (ref 70–99)
HCT: 42 % (ref 39.0–52.0)
Hemoglobin: 14.3 g/dL (ref 13.0–17.0)
Potassium: 5.2 mEq/L — ABNORMAL HIGH (ref 3.5–5.1)
Sodium: 139 mEq/L (ref 135–145)
TCO2: 25 mmol/L (ref 0–100)

## 2010-03-25 LAB — POCT CARDIAC MARKERS
CKMB, poc: <1 ng/mL — ABNORMAL LOW (ref 1.0–8.0)
Myoglobin, poc: 139 ng/mL (ref 12–200)
Troponin i, poc: <0.05 ng/mL (ref 0.00–0.09)

## 2010-03-25 LAB — CK TOTAL AND CKMB (NOT AT ARMC)
CK, MB: 1.1 ng/mL (ref 0.3–4.0)
Relative Index: INVALID (ref 0.0–2.5)
Total CK: 54 U/L (ref 7–232)

## 2010-03-25 LAB — TROPONIN I: Troponin I: 0.01 ng/mL (ref 0.00–0.06)

## 2010-03-25 LAB — PROTIME-INR
INR: 1.15 (ref 0.00–1.49)
Prothrombin Time: 14.9 seconds (ref 11.6–15.2)

## 2010-03-25 NOTE — Progress Notes (Signed)
Summary: refill  Phone Note Refill Request Message from:  Patient on April 01, 2009 12:31 PM  Refills Requested: Medication #1:  SIMVASTATIN 40 MG TABS Take one tablet at bedtime Send to [id number] [place]  Initial call taken by: Delsa Sale,  April 01, 2009 12:31 PM  Follow-up for Phone Call        just refilled on the 3rd with 7 refills...tried to call pt, number is busy. Mignon Pine, RMA  April 01, 2009 4:06 PM  Spoke with pt today he said he will call the pharmacy and if there is an issue he will let me know. Mignon Pine, [place]  April 02, 2009 1:50 PM

## 2010-03-25 NOTE — Assessment & Plan Note (Signed)
Summary: f55m   Visit Type:  6 months follow up Primary Provider:  Dr Brigitte Pulse  CC:  sob chest pain.  History of Present Illness: 70 year-old gentleman with CAD s/p inferior MI in June 2010 presents for follow-up today.  He was hospitalized in February 2011 with recurrent chest pain. He underwent cardiac catheterization at that time. The summary of his catheterization is below:  CONCLUSIONS: 1. Nonobstructive coronary artery disease with 40% mid and 30% distal stenosis in the LAD, 50% stenosis in the distal circumflex artery, 50% narrowing in the proximal right coronary at the edge of the     proximal stent and inferobasal wall hypo-to-akinesis. 2. 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  He continues to have episodic chest pains and shortness of breath. States chest pain is in left side of chest and 'it just hurts.' Not related to exertion. No edema or PND. He has chronic 3 pillow orthopnea.  The patient also complains of low back pain over the past 2-3 days...denies an injury. Attributes his elevated BP to his current pain level.    Current Medications (verified): 1)  Aspir-Trin 325 Mg Tbec (Aspirin) .... Take One Daily 2)  Lisinopril 5 Mg Tabs (Lisinopril) .... Take One Daily 3)  Plavix 75 Mg Tabs (Clopidogrel Bisulfate) .... Take One Daily 4)  Simvastatin 40 Mg Tabs (Simvastatin) .... Take One Tablet At Bedtime 5)  Diazepam 5 Mg Tabs (Diazepam) .... Take 1 and Half Daily 6)  Sertraline Hcl 100 Mg Tabs (Sertraline Hcl) .... Take One Daily 7)  Nitroglycerin 0.4 Mg Subl (Nitroglycerin) .... Take One As Needed 8)  Risperdal 0.5 Mg Tabs (Risperidone) .... Take One Daily 9)  Vitamin D 1000 Unit  Tabs (Cholecalciferol) .... Take 1 Tablet By Mouth Once A Day 10)  Calcium Carbonate-Vitamin D 600-400 Mg-Unit  Tabs (Calcium Carbonate-Vitamin D) .... Take 1 Tablet By Mouth Once A Day 11)  Omeprazole 20 Mg Tbec (Omeprazole) .Marland Kitchen.. 1x A  Week  Allergies: 1)  ! Paxil 2)  ! Neurontin  Past History:  Past medical history reviewed for relevance to current acute and chronic problems.  Past Medical History: Reviewed history from 11/19/2008 and no changes required. Current Problems:  CAD (ICD-414.00)status post acute inferoposterior  myocardial infarction secondary to right coronary artery occlusion.  HYPERTENSION (ICD-401.9) BRADYCARDIA (ICD-427.89) DYSLIPIDEMIA (ICD-272.4) TOBACCO ABUSE (ICD-305.1) CVA (ICD-434.91) BENIGN PROSTATIC HYPERTROPHY, HX OF (ICD-V13.8) GASTROESOPHAGEAL REFLUX DISEASE (ICD-530.81) ANXIETY (ICD-300.00) LEUKOCYTOSIS (ICD-288.60) HX NEUROPATHY (ICD-355.9)      Review of Systems       Positive for urinary hesitancy, otherwise negative except as per HPI  Vital Signs:  Patient profile:   70 year old male Height:      73 inches Weight:      210 pounds BMI:     27.81 Pulse rate:   71 / minute Pulse rhythm:   regular Resp:     18 per minute BP sitting:   170 / 84  (left arm) Cuff size:   large  Vitals Entered By: Sidney Ace (December 20, 2009 11:03 AM)  Physical Exam  General:  Pt is alert and oriented, in no acute distress. HEENT: normal Neck: normal carotid upstrokes with bilateral bruits, JVP normal Lungs: CTA CV: RRR without murmur or gallop Abd: soft, NT, positive BS, no bruit, no organomegaly Ext: one plus right leg edema.  Skin: warm and dry without rash    EKG  Procedure date:  12/20/2009  Findings:      NSR 71 bpm within normal limits.  Impression & Recommendations:  Problem # 1:  CAD (ICD-414.00) Pt stable. His chest pain pattern has not changed and cardiac cath showed nonobstructive disease earlier this year. Continue current medical program.  His updated medication list for this problem includes:    Aspirin 81 Mg Tbec (Aspirin) .Marland Kitchen... Take one tablet by mouth daily    Lisinopril 5 Mg Tabs (Lisinopril) .Marland Kitchen... Take one daily    Plavix 75 Mg Tabs  (Clopidogrel bisulfate) .Marland Kitchen... Take one daily    Nitroglycerin 0.4 Mg Subl (Nitroglycerin) .Marland Kitchen... Take one as needed  Orders: EKG w/ Interpretation (93000)  Problem # 2:  HYPERTENSION (ICD-401.9) BP elevated but pt didn't take lisinopril today and also is having pain. Will not adjust - I asked him to follow.  His updated medication list for this problem includes:    Aspirin 81 Mg Tbec (Aspirin) .Marland Kitchen... Take one tablet by mouth daily    Lisinopril 5 Mg Tabs (Lisinopril) .Marland Kitchen... Take one daily  Orders: EKG w/ Interpretation (93000)  Problem # 3:  DYSLIPIDEMIA (ICD-272.4) Per Dr Brigitte Pulse.  His updated medication list for this problem includes:    Simvastatin 40 Mg Tabs (Simvastatin) .Marland Kitchen... Take one tablet at bedtime  Problem # 4:  TOBACCO ABUSE (ICD-305.1) Tobacco cessation couseling but not interested in quitting.  Patient Instructions: 1)  Your physician has recommended you make the following change in your medication: DECREASE Aspirin to 81mg  once a day 2)  Your physician wants you to follow-up in: 6 MONTHS.  You will receive a reminder letter in the mail two months in advance. If you don't receive a letter, please call our office to schedule the follow-up appointment.

## 2010-03-25 NOTE — Assessment & Plan Note (Signed)
Summary: 2 MONTH ROV    Visit Type:  2 months follow up Primary Provider:  Dr Brigitte Pulse  CC:  Chest pains- Left arm pain.  History of Present Illness: 70 year-old gentleman with CAD s/p inferior MI in June 2010 presents for follow-up today. He complains of episodic left-sided chest pain, feels like an 'ache,' nonradiating. C/o shortness of breath associated with the chest pain. Took sublingual NTG for pain 2 days ago but it did not alleviate his symptoms. He is sedentary because of limitation related to foot problems.   States 'I feel fine' for the past few days. Reports chest pains ever since his infarct several months ago.  No other complaints at present.  Denies orthopnea, PND, or syncope.  Problems Prior to Update: 1)  Chest Pain Unspecified  (ICD-786.50) 2)  Cad  (ICD-414.00) 3)  Hypertension  (ICD-401.9) 4)  Bradycardia  (ICD-427.89) 5)  Dyslipidemia  (ICD-272.4) 6)  Tobacco Abuse  (ICD-305.1) 7)  Cva  (ICD-434.91) 8)  Benign Prostatic Hypertrophy, Hx of  (ICD-V13.8) 9)  Gastroesophageal Reflux Disease  (ICD-530.81) 10)  Anxiety  (ICD-300.00) 11)  Leukocytosis  (ICD-288.60) 12)  Neuropathy  (ICD-355.9)  Current Medications (verified): 1)  Aspir-Trin 325 Mg Tbec (Aspirin) .... Take One Daily 2)  Lisinopril 5 Mg Tabs (Lisinopril) .... Take One Daily 3)  Plavix 75 Mg Tabs (Clopidogrel Bisulfate) .... Take One Daily 4)  Simvastatin 40 Mg Tabs (Simvastatin) .... Take One Tablet At Bedtime 5)  Diazepam 5 Mg Tabs (Diazepam) .... Take 1 and Half Daily 6)  Sertraline Hcl 100 Mg Tabs (Sertraline Hcl) .... Take One Daily 7)  Nitroglycerin 0.4 Mg Subl (Nitroglycerin) .... Take One As Needed 8)  Hydrocodone-Acetaminophen 7.5-500 Mg Tabs (Hydrocodone-Acetaminophen) .... Take One 1/2 Daily 9)  Risperdal 0.5 Mg Tabs (Risperidone) .... Take One Daily 10)  Vitamin D 1000 Unit  Tabs (Cholecalciferol) .... Take 1 Tablet By Mouth Once A Day 11)  Calcium Carbonate-Vitamin D 600-400 Mg-Unit  Tabs  (Calcium Carbonate-Vitamin D) .... Take 1 Tablet By Mouth Once A Day  Allergies: 1)  ! Paxil 2)  ! Neurontin  Past History:  Past medical history reviewed for relevance to current acute and chronic problems.  Past Medical History: Current Problems:  CAD (ICD-414.00)status post acute inferoposterior  myocardial infarction secondary to right coronary artery occlusion.  HYPERTENSION (ICD-401.9) BRADYCARDIA (ICD-427.89) DYSLIPIDEMIA (ICD-272.4) TOBACCO ABUSE (ICD-305.1) CVA (ICD-434.91) BENIGN PROSTATIC HYPERTROPHY, HX OF (ICD-V13.8) GASTROESOPHAGEAL REFLUX DISEASE (ICD-530.81) ANXIETY (ICD-300.00) LEUKOCYTOSIS (ICD-288.60) HX NEUROPATHY (ICD-355.9)      Review of Systems       left-sided weakness - residual from stroke, feet swelling. Otherwise negative except per HPI.  Vital Signs:  Patient profile:   70 year old male Height:      72 inches Weight:      186 pounds BMI:     25.32 Pulse rate:   58 / minute Pulse rhythm:   regular Resp:     18 per minute BP sitting:   138 / 70  (left arm)  Vitals Entered By: Sidney Ace (November 19, 2008 10:23 AM)  Physical Exam  General:  Pt is alert and oriented, in no acute distress. HEENT: normal Neck: normal carotid upstrokes with a left carotid bruit, JVP normal Lungs: CTA CV: RRR without murmur or gallop Abd: soft, NT, positive BS, no bruit, no organomegaly Ext: 1+ bilateral pedal edema. Skin: warm and dry without rash Neuro - CN II-XII intact, left leg 4/5 stregth right leg 5/5 strength  EKG  Procedure date:  11/19/2008  Findings:      Sinus bradycardia with age-indeterminate inferior MI.  Impression & Recommendations:  Problem # 1:  CAD (ICD-414.00) The patient has developed recurrent chest discomfort. This has both typical and atypical features. Recommend an adenosine Myoview stress scan to rule out inducible ischemia. Otherwise we'll continue current medical therapy, which includes aspirin Plavix, as well  as lisinopril.  His updated medication list for this problem includes:    Aspir-trin 325 Mg Tbec (Aspirin) .Marland Kitchen... Take one daily    Lisinopril 5 Mg Tabs (Lisinopril) .Marland Kitchen... Take one daily    Plavix 75 Mg Tabs (Clopidogrel bisulfate) .Marland Kitchen... Take one daily    Nitroglycerin 0.4 Mg Subl (Nitroglycerin) .Marland Kitchen... Take one as needed  Orders: EKG w/ Interpretation (93000) Nuclear Stress Test (Nuc Stress Test)  Problem # 2:  HYPERTENSION (ICD-401.9) Blood pressure well controlled at present. His updated medication list for this problem includes:    Aspir-trin 325 Mg Tbec (Aspirin) .Marland Kitchen... Take one daily    Lisinopril 5 Mg Tabs (Lisinopril) .Marland Kitchen... Take one daily  Orders: EKG w/ Interpretation (93000) Nuclear Stress Test (Nuc Stress Test)  BP today: 138/70 Prior BP: 124/60 (09/05/2008)  Problem # 3:  DYSLIPIDEMIA (ICD-272.4) Lipids are followed by Dr. Brigitte Pulse.  His updated medication list for this problem includes:    Simvastatin 40 Mg Tabs (Simvastatin) .Marland Kitchen... Take one tablet at bedtime  Patient Instructions: 1)  Your physician recommends that you continue on your current medications as directed. Please refer to the Current Medication list given to you today. 2)  Your physician wants you to follow-up in:   6 MONTHS. You will receive a reminder letter in the mail two months in advance. If you don't receive a letter, please call our office to schedule the follow-up appointment. 3)  Your physician has requested that you have an adenosine myoview.  For further information please visit [email or web address].  Please follow instruction sheet, as given.

## 2010-03-25 NOTE — Progress Notes (Signed)
Summary: HAVING CHEST PAINS  SINCE FRIDAY HAVE TAKEN NITRO THIS MORNING.  Phone Note Call from Patient Call back at [facility] Phone [phone]   Caller: Patient Summary of Call: PT [place]. Initial call taken by: Delsa Sale,  April 22, 2009 8:16 AM  Follow-up for Phone Call        Has had CP since Friday with SOB, radiation to the jaw, diaphoresis and relief with Nitro. I ask him to call 911 and go to the emergency room for further evaluation. I advised him I would let them know he is coming. He understands and agree with plan.  Follow-up by: Barnett Abu, RN, BSN,  April 22, 2009 8:24 AM     Appended Document: HAVING CHEST PAINS  SINCE FRIDAY HAVE TAKEN NITRO THIS MORNING. agree with plan

## 2010-03-25 NOTE — Progress Notes (Signed)
Summary: question on meds  Phone Note Call from Patient Call back at Home Phone [phone]   Caller: Patient Reason for Call: Talk to Nurse Summary of Call: pt has question on meds Initial call taken by: Regan Lemming,  December 26, 2009 1:56 PM  Follow-up for Phone Call        pt concerned about taking Naproxen prescribed by his PCP. I explained to him that this should be okay as long  he takes as directed. Whitney Jannett Celestine RN  December 26, 2009 2:39 PM  Follow-up by: Whitney Jannett Celestine RN,  December 26, 2009 2:39 PM

## 2010-03-25 NOTE — Assessment & Plan Note (Signed)
Summary: eph/post cath/lg   Visit Type:  Follow-up  CC:  no complaints.  History of Present Illness: This is a 70 year old white male patient, who was recently hospitalized with an acute inferior posterior MI treated with bare-metal stent to the RCA. He also has severe 80% mid circumflex artery that is a small vessel, nonobstructive LAD stenosis, mild LV dysfunction, with overall preserved LV function, EF 55% with severe hypokinesis of the basal and mid portions of the inferior wall. Plavix was recommended ideally for 12 months if possible. 2-D echo estimated his EF to be 45% with a posterior wall hypokinetic basal to midinferior wall atraumaticwith grade 1 diastolic dysfunction.  Since the patient's been home he has had some soreness in his chest but he denies any chest tightness, heaviness, pressure, or dyspnea, dyspnea on exertion, dizziness, or presyncope. He continues to smoke cigarettes.  Preventive Screening-Counseling & Management  Alcohol-Tobacco     Smoking Status: current     Smoking Cessation Counseling: yes     Smoke Cessation Stage: contemplative     Packs/Day: 0.75  Current Medications (verified): 1)  Aspir-Trin 325 Mg Tbec (Aspirin) .... Take One Daily 2)  Lisinopril 5 Mg Tabs (Lisinopril) .... Take One Daily 3)  Plavix 75 Mg Tabs (Clopidogrel Bisulfate) .... Take One Daily 4)  Simvastatin 40 Mg Tabs (Simvastatin) .... Take One Tablet At Bedtime 5)  Diazepam 5 Mg Tabs (Diazepam) .... Take 1 and Half Daily 6)  Sertraline Hcl 100 Mg Tabs (Sertraline Hcl) .... Take One Daily 7)  Nitroglycerin 0.4 Mg Subl (Nitroglycerin) .... Take One As Needed 8)  Hydrocodone-Acetaminophen 7.5-500 Mg Tabs (Hydrocodone-Acetaminophen) .... Take One 1/2 Daily 9)  Vitamin D 10)  Calcium and Vit D .... Take One Daily 11)  Risperdal 0.5 Mg Tabs (Risperidone) .... Take One Daily  Allergies (verified): 1)  ! Paxil 2)  ! Neurontin  Past History:  Past Medical History: Last updated:  09/03/2008 Current Problems:  CAD (ICD-414.00)s post acute inferoposterio  myocardial infarction secondary to right coronary artery occlusion.  HYPERTENSION (ICD-401.9) BRADYCARDIA (ICD-427.89) DYSLIPIDEMIA (ICD-272.4) TOBACCO ABUSE (ICD-305.1) CVA (ICD-434.91) BENIGN PROSTATIC HYPERTROPHY, HX OF (ICD-V13.8) GASTROESOPHAGEAL REFLUX DISEASE (ICD-530.81) ANXIETY (ICD-300.00) LEUKOCYTOSIS (ICD-288.60) HX NEUROPATHY (ICD-355.9)      Social History: Reviewed history from 09/03/2008 and no changes required.  He lives in [place] alone.  His daughter lives   nearby.  He is retired from [employer].  He has a greater   than 50-pack year history of ongoing tobacco use but denies alcohol or   drug abuse.  Smoking Status:  current Packs/Day:  0.75  Review of Systems       see history of present illness. He has multiple neurological deficit secondary to his 2 prior strokes. He has constant elevation, where he has to spit into a cup on a regular basis. He also has problems difficulty walking  Physical Exam  General:  Elderly,well-nournished, in no acute distress. Neck: No JVD, HJR, Bruit, or thyroid enlargement Lungs: No tachypnea, clear without wheezing, rales, or rhonchi Cardiovascular: RRR, PMI not displaced, heart sounds normal, no murmurs, gallops, bruit, thrill, or heave. Abdomen: BS normal. Soft without organomegaly, masses, lesions or tenderness. Extremities: right groin without hematoma or hemorrhage, no cyanosis, clubbing or edema. Good distal pulses bilateral SKin: Warm, no lesions or rashes  Musculoskeletal: No deformities Neuro: no focal signs    Vital Signs:  Patient profile:   70 year old male Height:      72 inches Weight:  186 pounds BMI:     25.32 Pulse rate:   68 / minute Pulse rhythm:   irregular BP sitting:   124 / 60  (left arm)  Vitals Entered By: Talbert Nan, CMA (September 05, 2008 10:29 AM)  EKG  Procedure date:   09/05/2008  Findings:      normal sinus rhythm with inferior Q waves and T-wave inversion inferolaterally  Impression & Recommendations:  Problem # 1:  CAD (ICD-414.00) patient suffered an inferior posterior MI, August 15, 2008, treated with bare-metal stent to the RCA. He has residual severe 80% mid circumflex stenosis, nonobstructive LAD stenosis a mid segmental LV dysfunction. His updated medication list for this problem includes:    Aspir-trin 325 Mg Tbec (Aspirin) .Marland Kitchen... Take one daily    Lisinopril 5 Mg Tabs (Lisinopril) .Marland Kitchen... Take one daily    Plavix 75 Mg Tabs (Clopidogrel bisulfate) .Marland Kitchen... Take one daily    Nitroglycerin 0.4 Mg Subl (Nitroglycerin) .Marland Kitchen... Take one as needed  Orders: EKG w/ Interpretation (93000)  Problem # 2:  BRADYCARDIA (ICD-427.89) the patient had bradycardia in the hospital was not placed on a beta blocker. His heart rate still only in the 60s. I will not start a beta blocker today. His updated medication list for this problem includes:    Aspir-trin 325 Mg Tbec (Aspirin) .Marland Kitchen... Take one daily    Lisinopril 5 Mg Tabs (Lisinopril) .Marland Kitchen... Take one daily    Plavix 75 Mg Tabs (Clopidogrel bisulfate) .Marland Kitchen... Take one daily    Nitroglycerin 0.4 Mg Subl (Nitroglycerin) .Marland Kitchen... Take one as needed  Problem # 3:  HYPERTENSION (ICD-401.9) blood pressure is stable today. I will not make any changes His updated medication list for this problem includes:    Aspir-trin 325 Mg Tbec (Aspirin) .Marland Kitchen... Take one daily    Lisinopril 5 Mg Tabs (Lisinopril) .Marland Kitchen... Take one daily  Problem # 4:  TOBACCO ABUSE (ICD-305.1) I discussed in detail the importance of smoking cessation.  Patient Instructions: 1)  followup with Dr. Burt Knack in 2 months 2)  Your physician discussed the hazards of tobacco use.  Tobacco use cessation is recommended and techniques and options to help you quit were discussed. Prescriptions: SIMVASTATIN 40 MG TABS (SIMVASTATIN) Take one tablet at bedtime  #90 x 3    Entered by:   Talbert Nan, CMA   Authorized by:   Marland Kitchen LB [facility]URCHDEFAULT   Signed by:   Talbert Nan, CMA on 09/05/2008   Method used:   Historical   [id number]

## 2010-03-25 NOTE — Assessment & Plan Note (Signed)
Summary: Cardiology Nuclear Study  Nuclear Med Background Indications for Stress Test: Evaluation for Ischemia, Stent Patency   History: Echo, Heart Catheterization, Myocardial Infarction, Stents  History Comments: 6/10 IPWMI>Stent- RCA, EF=55%; 6/10 Echo: EF=45%.  Symptoms: Chest Tightness, Diaphoresis, DOE, SOB  Symptoms Comments: (L) arm pain   Nuclear Pre-Procedure Cardiac Risk Factors: CVA, Family History - CAD, Hypertension, Lipids, Smoker Caffeine/Decaff Intake: None NPO After: 9:30 PM Lungs: Clear IV 0.9% NS with Angio Cath: 20g     IV Site: (R) Hand IV Started by: Eliezer Lofts EMT-P Chest Size (in) 44-46     Height (in): 73 Weight (lb): 193 BMI: 25.56  Nuclear Med Study 1 or 2 day study:  1 day     Stress Test Type:  Adenosine Reading MD:  Kirk Ruths, MD     Referring MD:  Sherren Mocha, MD Resting Radionuclide:  Technetium 10m Tetrofosmin     Resting Radionuclide Dose:  10 mCi  Stress Radionuclide:  Technetium 24m Tetrofosmin     Stress Radionuclide Dose:  33 mCi   Stress Protocol      Max HR:  72 bpm Max Systolic BP: Q000111Q mm HgRate Pressure Product:  10800 Dose of Adenosine:  49.1 mg    Stress Test Technologist:  Valetta Fuller CMA-N     Nuclear Technologist:  Charlton Amor CNMT  Rest Procedure  Myocardial perfusion imaging was performed at rest 45 minutes following the intraveneous administration of Myoview Technetium 41m Tetrofosmin.  Stress Procedure  The patient received IV adenosine at 140 mcg/kg/min for 4 minutes. There were episodes of 2nd degree AVB and occasional PVC's with infusion.  He did c/o chest tightness with infusion.  Myoview was injected at the 2 minute mark and quantitative spect images were obtained after a 45 minute delay.  QPS Raw Data Images:  There is no interference from other nuclear activity. Stress Images:  There is decreased uptake in the inferior wall. Rest Images:  There is decreased uptake in the inferior  wall. Subtraction (SDS):  There is a fixed defect that is most consistent with a previous infarction. Transient Ischemic Dilatation:  1.17  (Normal <1.22)  Lung/Heart Ratio:  .39  (Normal <0.45)  Quantitative Gated Spect Images QGS EDV:  95 ml QGS ESV:  51 ml QGS EF:  46 % QGS cine images:  Inferior akinesis.   Overall Impression  Exercise Capacity: Adenosine study with no exercise. ECG Impression: No significant ST segment change suggestive of ischemia. Overall Impression: Abnormal adenosine nuclear study with prior inferior infarct but no ischemia.  Appended Document: Cardiology Nuclear Study no ischemia. cont med Rx for CAD.  Appended Document: Cardiology Nuclear Study Pt aware of results by phone.

## 2010-03-25 NOTE — Assessment & Plan Note (Signed)
Summary: eph   Visit Type:  Post-hospital Primary Provider:  Dr Brigitte Pulse  CC:  Sob.  History of Present Illness: 70 year-old gentleman with CAD s/p inferior MI in June 2010 presents for follow-up today.  He was hospitalized in February 2011 with recurrent chest pain. He underwent cardiac catheterization at that time. The summary of his catheterization is below:  CONCLUSIONS: 1. Nonobstructive coronary artery disease with 40% mid and 30% distal stenosis in the LAD, 50% stenosis in the distal circumflex artery, 50% narrowing in the proximal right coronary at the edge of the     proximal stent and inferobasal wall hypo-to-akinesis. 2. 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  He complains of episodic dyspnea, unrelated to exertion. He denies chest pains. He has seen Dr. Oneida Alar in followup and is going to undergo a lower extremity angiogram tomorrow for further evaluation of his iliac disease.  His activity level is limited secondary to residual deficit from a prior stroke.  He continues to smoke one half pack of cigarettes daily.   Current Medications (verified): 1)  Aspir-Trin 325 Mg Tbec (Aspirin) .... Take One Daily 2)  Lisinopril 5 Mg Tabs (Lisinopril) .... Take One Daily 3)  Plavix 75 Mg Tabs (Clopidogrel Bisulfate) .... Take One Daily 4)  Simvastatin 40 Mg Tabs (Simvastatin) .... Take One Tablet At Bedtime 5)  Diazepam 5 Mg Tabs (Diazepam) .... Take 1 and Half Daily 6)  Sertraline Hcl 100 Mg Tabs (Sertraline Hcl) .... Take One Daily 7)  Nitroglycerin 0.4 Mg Subl (Nitroglycerin) .... Take One As Needed 8)  Hydrocodone-Acetaminophen 7.5-500 Mg Tabs (Hydrocodone-Acetaminophen) .... Take One 1/2 Daily 9)  Risperdal 0.5 Mg Tabs (Risperidone) .... Take One Daily 10)  Vitamin D 1000 Unit  Tabs (Cholecalciferol) .... Take 1 Tablet By Mouth Once A Day 11)  Calcium Carbonate-Vitamin D 600-400 Mg-Unit  Tabs (Calcium Carbonate-Vitamin D) ....  Take 1 Tablet By Mouth Once A Day 12)  Omeprazole 20 Mg Tbec (Omeprazole) .... 40mg  Daily  Allergies: 1)  ! Paxil 2)  ! Neurontin  Past History:  Past medical history reviewed for relevance to current acute and chronic problems.  Past Medical History: Reviewed history from 11/19/2008 and no changes required. Current Problems:  CAD (ICD-414.00)status post acute inferoposterior  myocardial infarction secondary to right coronary artery occlusion.  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 HEENT: normal Neck: normal carotid upstrokes with bilateral bruits, JVP normal Lungs: CTA CV: RRR without murmur or gallop Abd: soft, NT, positive BS, no bruit, no organomegaly Ext: one plus right leg edema.  Skin: warm and dry without rash    EKG  Procedure date:  05/30/2009  Findings:      Normal sinus rhythm, heart rate 64 beats per minute, within normal limits.  Impression & Recommendations:  Problem # 1:  CAD (ICD-414.00) Stable coronary anatomy by recent catheterization. Catheter results were reviewed in the office today. Recommend continue current medical therapy as outlined below with aspirin, Plavix, and lisinopril.  He is not on a beta blocker because of bradycardia.  His updated medication  list for this problem includes:    Aspir-trin 325 Mg Tbec (Aspirin) .Marland Kitchen... Take one daily    Lisinopril 5 Mg Tabs (Lisinopril) .Marland Kitchen... Take one daily    Plavix 75 Mg Tabs (Clopidogrel bisulfate) .Marland Kitchen... Take one daily    Nitroglycerin 0.4 Mg Subl (Nitroglycerin) .Marland Kitchen... Take one as needed  Problem # 2:  HYPERTENSION (ICD-401.9) Currently well-controlled.  His updated medication list for this problem includes:    Aspir-trin 325 Mg Tbec (Aspirin) .Marland Kitchen... Take one daily    Lisinopril 5 Mg Tabs (Lisinopril) .Marland Kitchen... Take one daily  BP today: 128/74 Prior BP: 138/70 (11/19/2008)  Problem # 3:  TOBACCO ABUSE (ICD-305.1) Reviewed importance of cessation, patient not inclined to stop.  Patient Instructions: 1)  Your physician recommends that you schedule a follow-up appointment in: 6 MONTHS

## 2010-03-25 NOTE — Progress Notes (Signed)
Summary: Nuc. Pre-Procedure  Phone Note Outgoing Call Call back at [facility] Phone [phone]   Call placed by: Irven Baltimore, RN,  November 26, 2008 1:53 PM Summary of Call: Reviewed information on Myoview Information Sheet (see scanned document for further details).  Spoke with patient.     Nuclear Med Background Indications for Stress Test: Evaluation for Ischemia, Stent Patency   History: Echo, Heart Catheterization, Myocardial Infarction, Stents  History Comments: 6/10 Inferior-posterior MI>Cath:occlusion RCA> stent RCA, severe CFX stenosis (small vessel), N/O LAD>,EF=55% 6/10 Echo: EF=45%.  Symptoms: Chest Pain, SOB  Symptoms Comments: (L) arm pain   Nuclear Pre-Procedure Cardiac Risk Factors: CVA, Family History - CAD, Hypertension, Lipids, Smoker Height (in): 83 Tech Comments: Strong Family hx CAD; Hx. CVA with left sided residual.

## 2010-03-26 DIAGNOSIS — R079 Chest pain, unspecified: Secondary | ICD-10-CM

## 2010-03-26 LAB — COMPREHENSIVE METABOLIC PANEL
ALT: 20 U/L (ref 0–53)
AST: 21 U/L (ref 0–37)
Albumin: 4 g/dL (ref 3.5–5.2)
Alkaline Phosphatase: 66 U/L (ref 39–117)
BUN: 11 mg/dL (ref 6–23)
CO2: 25 mEq/L (ref 19–32)
Calcium: 9.6 mg/dL (ref 8.4–10.5)
Chloride: 104 mEq/L (ref 96–112)
Creatinine, Ser: 1.68 mg/dL — ABNORMAL HIGH (ref 0.4–1.5)
GFR calc Af Amer: 49 mL/min — ABNORMAL LOW (ref >60)
GFR calc non Af Amer: 40 mL/min — ABNORMAL LOW (ref >60)
Glucose, Bld: 105 mg/dL — ABNORMAL HIGH (ref 70–99)
Potassium: 4.3 mEq/L (ref 3.5–5.1)
Sodium: 140 mEq/L (ref 135–145)
Total Bilirubin: 0.5 mg/dL (ref 0.3–1.2)
Total Protein: 7.6 g/dL (ref 6.0–8.3)

## 2010-03-26 LAB — CBC
HCT: 39.9 % (ref 39.0–52.0)
Hemoglobin: 12.3 g/dL — ABNORMAL LOW (ref 13.0–17.0)
MCH: 27 pg (ref 26.0–34.0)
MCHC: 30.8 g/dL (ref 30.0–36.0)
MCV: 87.7 fL (ref 78.0–100.0)
Platelets: 170 10*3/uL (ref 150–400)
RBC: 4.55 MIL/uL (ref 4.22–5.81)
RDW: 14.4 % (ref 11.5–15.5)
WBC: 7.3 10*3/uL (ref 4.0–10.5)

## 2010-03-26 LAB — CARDIAC PANEL(CRET KIN+CKTOT+MB+TROPI)
CK, MB: 1.1 ng/mL (ref 0.3–4.0)
CK, MB: 1.1 ng/mL (ref 0.3–4.0)
Relative Index: INVALID (ref 0.0–2.5)
Relative Index: INVALID (ref 0.0–2.5)
Total CK: 70 U/L (ref 7–232)
Total CK: 81 U/L (ref 7–232)
Troponin I: 0.01 ng/mL (ref 0.00–0.06)
Troponin I: <0.01 ng/mL (ref 0.00–0.06)

## 2010-03-26 LAB — LIPID PANEL
Cholesterol: 96 mg/dL (ref 0–200)
HDL: 27 mg/dL — ABNORMAL LOW (ref >39)
LDL Cholesterol: 45 mg/dL (ref 0–99)
Total CHOL/HDL Ratio: 3.6 RATIO
Triglycerides: 121 mg/dL (ref <150)
VLDL: 24 mg/dL (ref 0–40)

## 2010-03-31 NOTE — H&P (Signed)
[name], [name] NO.:  [id number]  MEDICAL RECORD NO.:  [id number]          PATIENT TYPE:  INP  LOCATION:  6527                         FACILITY:  [place]  PHYSICIAN:  James Bors. Stanford Breed, MD, FACCDATE OF BIRTH:  25-Apr-1940  DATE OF ADMISSION:  03/25/2010 DATE OF DISCHARGE:                             HISTORY & PHYSICAL   PRIMARY CARDIOLOGIST:  James Bond. Burt Knack, MD  PRIMARY CARE PROVIDER:  Janalyn Rouse, MD  PATIENT PROFILE:  This is a 70 year old male with history of CVA and inferior MI status post RCA bare-metal stenting in June 2010 presents with a 3-day history of chest pain.  PROBLEMS: 1. Chest pain/coronary artery disease.     a.     Status post inferior ST-segment elevation myocardial      infarction in June 2010 with catheterization revealing a totally      occluded right coronary artery and otherwise nonobstructive      disease and ejection fraction of 55%.  The right coronary artery      was stented with a 3.0 x 20-mm Liberte bare-metal stent.     b.     Cardiac catheterization on April 23, 2009, left main normal,      left anterior descending 40% mid, 30% distal.  Left circumflex 50%      distal.  Right coronary artery 50% proximal.  Stent was patent.      Ejection fraction was 50-55%.  The patient was noted to have an      80% left common iliac artery stenosis and a functionally occluded      right common iliac artery. 2. Peripheral vascular disease.     a.     Status post right external iliac artery stenting and left      common iliac artery stenting in April 2011.     b.     Normal ankle-brachial index in November 2011. 3. Hypertension. 4. Hyperlipidemia. 5. Ongoing tobacco abuse with greater than 50-pack-year history,     currently smoking less than pack a day. 6. History of cerebrovascular accident x2 in [place]. 7. History bradycardia. 8. Benign prostatic hypertrophy 9. Gastroesophageal reflux  disease. 10.Anxiety. 11.Peripheral neuropathy. 12.Stage III chronic kidney disease, baseline creatinine of     approximately 1.5. 13.History of food impaction with mid esophageal tear in January 2011,     seen by Dr. Carol [name]. 14.History of colon polyps. 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 16.History of right foot surgery. 17.Status post cholecystectomy. 18.Heavy caffeine use, drinking 1-2 two cups of coffee a day as well     as 2 liters of [place] per day.  ALLERGIES:  PAXIL and NEURONTIN.  HISTORY OF PRESENT ILLNESS:  This is a 70 year old male with history of CVA x2 in the 1990s and subsequently coronary artery disease status post inferior ST-elevation MI in June 2010 with bare-metal stenting of the right coronary artery.  Secondary to recurrent chest pain, the patient underwent cardiac catheterization in March 2011 showing nonobstructive CAD though he did have significant bilateral iliac disease and was subsequently stented by Vascular Surgery as above.  The patient has  had occasional chest pain about once a month, almost exclusively at rest lasting a few hours and resolving spontaneously. Over the past three mornings while drinking coffee, he has had substernal chest pain with mild dyspnea.  Symptoms usually last 2-3 hours and resolve spontaneously.  Today, he had recurrent symptoms and he took 2 sublingual nitroglycerin over a period of 3 hours without significant relief.  He then called his neighbor who took him into the [facility] ED.  Here, he is pain free.  ECG shows T-wave inversion in lead III with subtle inferior ST elevation which is not new.  We have been asked to admit.  HOME MEDICATIONS: 1. Plavix 75 mg daily. 2. Simvastatin 40 mg nightly. 3. Zoloft 100 mg daily. 4. Lisinopril 5 mg daily. 5. Risperdal 0.5 mg daily. 6. Prilosec 20 mg daily. 7. Aspirin 81 mg daily. 8. Valium 7.5 mg daily. 9. Nitroglycerin p.r.n. 10.Vitamin D 1000 units  daily. 11.Calcium plus D 600/400 mg daily.  FAMILY HISTORY:  Mother died of stroke.  Father died of stroke at 59. He has brothers with history of MI and coronary disease.  SOCIAL HISTORY:  The patient lives in [place] by himself.  He has a neighbor who helps him out and also a daughter that lives in [place]. He had a greater than 50-pack-year history of tobacco abuse, now smoking less than a pack a day.  He denies alcohol or drug use.  He is retired from the [employer].  He drinks 1-2 cups of coffee a day and 2 liters of [employer] daily.  REVIEW OF SYSTEMS:  Positive for chest pain, urinary straining, and bilateral foot pain which limits mobility.  He has chronic constipation. He is a full code.  Otherwise, all systems reviewed and negative.  PHYSICAL EXAMINATION:  VITAL SIGNS:  Temperature 97.2, heart rate 78, respirations 20, blood pressure 134/65, pulse ox 98% on 2 liters. GENERAL:  A pleasant white male, in no acute distress, awake and alert x3.  He has a normal affect. HEENT:  Normal. NEUROLOGIC:  Grossly intact.  Nonfocal. SKIN:  Warm and dry without lesions or masses. NECK:  Supple without bruits or JVD. LUNGS:  Respirations are unlabored with diminished breath sounds bilaterally. CARDIAC:  Regular S1 and S2.  No S3, S4, murmurs. ABDOMEN:  Round, soft, nontender, nondistended.  Bowel sounds present x4.  No abdominal bruits noted. EXTREMITIES:  Warm, dry, pink.  No clubbing, cyanosis, or edema. Dorsalis pedis pulses are diminished with 1+ posterior tibial pulses. No femoral bruits noted.  ECG shows sinus rhythm, rate of 75, normal axis.  He has a small inferior Qs with less than 1-mm inferior elevation which is old.  T-wave inversion in lead III.  Hemoglobin 14.3, hematocrit 42.0.  Sodium 139, potassium 5.2, chloride 107, CO2 is 25, BUN 16, creatinine 1.6, glucose 92.  CK-MB less than 1.0, troponin I less than 0.05.  ASSESSMENT AND PLAN: 1. Chest  pain.  Over the past 3 days, the patient has had chest pain     while drinking coffee, lasting hours at a time and resolving     spontaneously.  Nitro did not help this morning.  Atypical for     angina.  Plan to admit and cycle enzymes.  Continue home meds.  If     enzymes are negative, plan discharge in the a.m. with outpatient     Myoview.  If enzymes are positive, plan cath.  Of note, the patient  does have history of esophageal tear and food impaction.  Continue     proton pump inhibitor. 2. Hypertension.  Continue home meds.  Follow. 3. Hyperlipidemia.  Continue statin therapy. 4. Tobacco abuse.  Smoking cessation advised.     Murray Hodgkins, ANP   ______________________________ James Bors. Stanford Breed, MD, [facility]    CB/MEDQ  D:  03/25/2010  T:  03/26/2010  Job:  [id number]  Electronically Signed by Murray Hodgkins ANP on 03/28/2010 04:23:15 PM Electronically Signed by Kirk Ruths MD [place] on 03/31/2010 08:10:49 AM

## 2010-04-02 NOTE — Progress Notes (Signed)
Summary: chest pains with sob  Phone Note Call from Patient Call back at Home Phone [phone]   Caller: [name] Summary of Call: Chest pains with sob pt have taken Nitro Initial call taken by: Delsa Sale,  March 25, 2010 11:28 AM  Follow-up for Phone Call        03/25/10--1130am--pt's wife calling stating pt having CP and SOB--has had CP for three mornings in a row--pain is 5 on scale 1--10--also c/o SOB--advised to go to nearest ED [facility]) --pt's wife agrees--nt Follow-up by: Leodis Sias, RN,  March 25, 2010 12:32 PM

## 2010-04-07 ENCOUNTER — Telehealth (INDEPENDENT_AMBULATORY_CARE_PROVIDER_SITE_OTHER): Payer: Self-pay | Admitting: *Deleted

## 2010-04-08 ENCOUNTER — Encounter: Payer: Self-pay | Admitting: Cardiology

## 2010-04-08 ENCOUNTER — Ambulatory Visit (HOSPITAL_COMMUNITY): Payer: Medicare Other | Attending: Cardiovascular Disease

## 2010-04-08 DIAGNOSIS — R0602 Shortness of breath: Secondary | ICD-10-CM

## 2010-04-08 DIAGNOSIS — I2510 Atherosclerotic heart disease of native coronary artery without angina pectoris: Secondary | ICD-10-CM

## 2010-04-08 DIAGNOSIS — R079 Chest pain, unspecified: Secondary | ICD-10-CM

## 2010-04-15 ENCOUNTER — Ambulatory Visit (INDEPENDENT_AMBULATORY_CARE_PROVIDER_SITE_OTHER): Payer: Medicare Other | Admitting: Cardiovascular Disease

## 2010-04-15 ENCOUNTER — Encounter: Payer: Self-pay | Admitting: Cardiovascular Disease

## 2010-04-15 DIAGNOSIS — I2510 Atherosclerotic heart disease of native coronary artery without angina pectoris: Secondary | ICD-10-CM

## 2010-04-16 NOTE — Progress Notes (Signed)
Summary: nuc pre procedure  Phone Note Outgoing Call Call back at Home Phone [phone]   Call placed by: Matilde Haymaker RN,  April 07, 2010 11:06 AM Call placed to: Patient Reason for Call: Confirm/change Appt Summary of Call: Reviewed information on Myoview Information Sheet (see scanned document for further details).  Spoke with patient.      Nuclear Med Background Indications for Stress Test: Evaluation for Ischemia, Stent Patency, Post Hospital  Indications Comments: 03/26/10 [facility] at [facility] with CP and neg enzymes.  History: Echo, Heart Catheterization, Myocardial Infarction, Stents  History Comments: 6/10 IPWMI>Stent- RCA, EF=55%; 6/10 Echo: EF=45%. 2/11 Cath-nonobstructive CAD and EF=50-55% Hx of nonsustained VT  Symptoms: Chest Pain, SOB  Symptoms Comments: (L) arm pain   Nuclear Pre-Procedure Cardiac Risk Factors: CVA, Family History - CAD, Hypertension, Lipids, PVD, Smoker Height (in): 76  Nuclear Med Study Referring MD:  Sherren Mocha, MD

## 2010-04-16 NOTE — Assessment & Plan Note (Addendum)
Summary: Cardiology Nuclear Testing  Nuclear Med Background Indications for Stress Test: Evaluation for Ischemia, Stent Patency, Post Hospital  Indications Comments: 03/26/10 CP, (-) enzymes.  History: Echo, Heart Catheterization, Myocardial Infarction, Myocardial Perfusion Study, Stents  History Comments: '10 IWMI>Stent-RCA, EF=55%; '10 Echo: EF=45%; 2/11 Cath:n/o CAD, EF=50-55%; h/o NSVT  Symptoms: Chest Pain, Palpitations, SOB  Symptoms Comments: Last episode of [id number] since d/c.   Nuclear Pre-Procedure Cardiac Risk Factors: CVA, Family History - CAD, Hypertension, Lipids, PVD, Smoker Caffeine/Decaff Intake: none NPO After: 7:00 PM Lungs: Clear.  O2 Sat 98% on RA. IV 0.9% NS with Angio Cath: 18g     IV Site: R Antecubital IV Started by: Eliezer Lofts, EMT-P Chest Size (in) 44     Height (in): 72 Weight (lb): 213 BMI: 28.99  Nuclear Med Study 1 or 2 day study:  1 day     Stress Test Type:  Carlton Adam Reading MD:  Kirk Ruths, MD     Referring MD:  Sherren Mocha, MD Resting Radionuclide:  Technetium 65m Tetrofosmin     Resting Radionuclide Dose:  11 mCi  Stress Radionuclide:  Technetium 56m Tetrofosmin     Stress Radionuclide Dose:  33 mCi   Stress Protocol   Lexiscan: 0.4 mg   Stress Test Technologist:  Valetta Fuller, CMA-N     Nuclear Technologist:  Charlton Amor, CNMT  Rest Procedure  Myocardial perfusion imaging was performed at rest 45 minutes following the intravenous administration of Technetium 78m Tetrofosmin.  Stress Procedure  The patient received IV Lexiscan 0.4 mg over 15-seconds.  Technetium 54m Tetrofosmin injected at 30-seconds.  There were no significant changes with infusion.  Quantitative spect images were obtained after a 45 minute delay.  QPS Raw Data Images:  Acquisition technically good; normal left ventricular size. Stress Images:  There is decreased uptake in the inferolateral wall Rest Images:  There is decreased uptake in the inferolateral  wall, less prominent compared to the stress images. Subtraction (SDS):  These findings are consistent with prior inferolateral infarct and mild peri-infarct ischemia. Transient Ischemic Dilatation:  1.01  (Normal <1.22)  Lung/Heart Ratio:  .28  (Normal <0.45)  Quantitative Gated Spect Images QGS EDV:  123 ml QGS ESV:  42 ml QGS EF:  65 % QGS cine images:  Akinesis of the inferolateral wall.   Overall Impression  Exercise Capacity: Lexiscan with no exercise. BP Response: Normal blood pressure response. Clinical Symptoms: No chest pain ECG Impression: No significant ST segment change suggestive of ischemia. Overall Impression: Abnormal lexiscan nuclear study with prior inferolateral infarct and mild peri-infarct ischemia.  Appended Document: Cardiology Nuclear Testing consistent with prior MI. Mild peri-infarct ischemia noted - overall low-risk and would recommend continued medical therapy.  Appended Document: Cardiology Nuclear Testing PT AWARE./CY

## 2010-04-22 NOTE — Assessment & Plan Note (Signed)
Summary: eph   Visit Type:  Follow-up Primary Provider:  Dr Brigitte Pulse  CC:  Pt. quit taking Metoprolol tartrate due to side effects- tiredness.  History of Present Illness: 70 year-old gentleman with CAD s/p inferior MI in June 2010 presents for follow-up today.  He was hospitalized in February 2011 with recurrent chest pain and underwent cardiac catheterization showing a patent stent and diffuse nonobstructive disease. He was noted to have 50% stenosis at the proximal edge of his RCA stent.  He was hospitalized earlier this year with chest pain, but ruled out for MI and had an atypical pain pattern. He was discharged home the following day with an outpatient stress test arranged (see results below).  The patient reports no further chest pain. He is inactive and limited by residual deficit from an old stroke. He denies edema, orthopnea, PND, or palpitations.   Current Medications (verified): 1)  Aspirin 81 Mg Tbec (Aspirin) .... Take One Tablet By Mouth Daily 2)  Lisinopril 5 Mg Tabs (Lisinopril) .... Take One Daily 3)  Plavix 75 Mg Tabs (Clopidogrel Bisulfate) .... Take One Daily 4)  Simvastatin 40 Mg Tabs (Simvastatin) .... Take One Tablet At Bedtime 5)  Diazepam 5 Mg Tabs (Diazepam) .... Take 1 and Half Daily 6)  Sertraline Hcl 100 Mg Tabs (Sertraline Hcl) .... Take One Daily 7)  Nitroglycerin 0.4 Mg Subl (Nitroglycerin) .... Take One As Needed 8)  Risperdal 0.5 Mg Tabs (Risperidone) .... Take One Daily 9)  Vitamin D 1000 Unit  Tabs (Cholecalciferol) .... Take 1 Tablet By Mouth Once A Day 10)  Calcium Carbonate-Vitamin D 600-400 Mg-Unit  Tabs (Calcium Carbonate-Vitamin D) .... Take 1 Tablet By Mouth Once A Day 11)  Omeprazole 20 Mg Tbec (Omeprazole) .Marland Kitchen.. 1x A Week  Allergies: 1)  ! Paxil 2)  ! Neurontin  Past History:  Past medical history reviewed for relevance to current acute and chronic problems.  Past Medical History: Reviewed history from 11/19/2008 and no changes  required. Current Problems:  CAD (ICD-414.00)status post acute inferoposterior  myocardial infarction secondary to right coronary artery occlusion.  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 HEENT: normal Neck: normal carotid upstrokes with bilateral bruits, JVP normal Lungs: CTA CV: RRR without murmur or gallop Abd: soft, NT, positive BS, no bruit, no organomegaly Ext: one plus right leg edema.  Skin: warm and dry without rash    Nuclear Study  Procedure date:  04/08/2010  Findings:      QPS  Raw Data Images:  Acquisition technically good; normal left ventricular size. Stress Images:  There is decreased uptake in the inferolateral wall Rest Images:  There is decreased uptake in the inferolateral wall, less prominent compared to the  stress images. Subtraction (SDS):  These findings are consistent with prior inferolateral infarct and mild peri-infarct ischemia. Transient Ischemic Dilatation:  1.01  (Normal <1.22)  Lung/Heart Ratio:  .28  (Normal <0.45)  Quantitative Gated Spect Images  QGS EDV:  123 ml QGS ESV:  42 ml QGS EF:  65 % QGS cine images:  Akinesis of the inferolateral wall.   Overall Impression   Exercise Capacity: Lexiscan with no exercise. BP Response: Normal blood  pressure response. Clinical Symptoms: No chest pain ECG Impression: No significant ST segment change suggestive of ischemia. Overall Impression: Abnormal lexiscan nuclear study with prior inferolateral infarct and mild peri-infarct ischemia.   Impression & Recommendations:  Problem # 1:  CAD (ICD-414.00) Pt with prior MI and repeat catheterization showing stent patency. Myoview results reviewed and showed inferior infarction with mild peri-infarction ischemia. Overall this appears low-risk and I would favor ongoing medical therapy, especially since he is having no more chest pain.   His updated medication list for this problem includes:    Aspirin 81 Mg Tbec (Aspirin) .Marland Kitchen... Take one tablet by mouth daily    Lisinopril 10 Mg Tabs (Lisinopril) .Marland Kitchen... Take one tablet by mouth daily    Plavix 75 Mg Tabs (Clopidogrel bisulfate) .Marland Kitchen... Take one daily    Nitroglycerin 0.4 Mg Subl (Nitroglycerin) .Marland Kitchen... Take one as needed  Problem # 2:  HYPERTENSION (ICD-401.9) BP control suboptimal - increase lisinopril to 10 mg.  His updated medication list for this problem includes:    Aspirin 81 Mg Tbec (Aspirin) .Marland Kitchen... Take one tablet by mouth daily    Lisinopril 10 Mg Tabs (Lisinopril) .Marland Kitchen... Take one tablet by mouth daily  BP today: 150/84 Prior BP: 170/84 (12/20/2009)  Problem # 3:  DYSLIPIDEMIA (ICD-272.4) Lipids followed by Dr Brigitte Pulse. Goal LDL less than 70 mg/dL with his history of MI.  His updated medication list for this problem includes:    Simvastatin 40 Mg Tabs (Simvastatin) .Marland Kitchen... Take one tablet at bedtime  Patient Instructions: 1)  Your physician recommends that you schedule a follow-up appointment in: 6 months 2)  Your physician has recommended you make the following change in your medication: INCREASE LISINOPRIL to 10mg  daily.

## 2010-05-01 NOTE — Discharge Summary (Signed)
NAME:  [name], [name] NO.:  [id number]  MEDICAL RECORD NO.:  [id number]           PATIENT TYPE:  I  LOCATION:  6527                         FACILITY:  [facility]  PHYSICIAN:  Juanda Bond. Burt Knack, MD  DATE OF BIRTH:  1940/06/04  DATE OF ADMISSION:  03/25/2010 DATE OF DISCHARGE:  03/26/2010                              DISCHARGE SUMMARY   PRIMARY CARDIOLOGIST:  Juanda Bond. Burt Knack, MD  PRIMARY CARE PROVIDER:  Janalyn Rouse, MD  DISCHARGE DIAGNOSES: 1. Chest pain ruled out for myocardial infarction. 2. Hypertension. 3. Nonsustained ventricular tachycardia. 4. Continued tobacco abuse.  SECONDARY DIAGNOSES: 1. Coronary artery disease.     a.     Status post inferior ST-segment elevation myocardial      infarction in 2010 status post right coronary artery bare-metal      stent.     b.     Cardiac catheterization on April 23, 2009, demonstrating      nonobstructive coronary artery disease with a patent stent in the      right coronary artery. 2. Peripheral vascular disease.     a.     Status post right external iliac artery stenting and left      common iliac artery stenting in April 2011. 3. Hyperlipidemia. 4. History of cerebrovascular accident x2. 5. Gastroesophageal reflux disease. 6. Anxiety. 7. Peripheral neuropathy. 8. Chronic kidney disease, stage III.  PROCEDURE/DIAGNOSTICS PERFORMED DURING HOSPITALIZATION:  Chest x-ray showing stable chest with no acute cardiopulmonary disease.  ALLERGIES:  PAXIL and NEURONTIN.  REASON FOR ADMISSION:  This is a 70 year old gentleman who presented to the emergency department with complaints of substernal chest pain with mild dyspnea over the last three mornings while drinking his coffee. Pain lasted 2-3 hours and resolved spontaneously.  The patient had tried taking nitroglycerin sublingual without relief.  The patient's pain did resolve on its own by the time he went to the emergency department.  His EKG showed  T-wave inversions in lead III with septal inferior ST elevations that are unchanged from previous tracing.  The patient was admitted for chest pain.  HOSPITAL COURSE:  The patient ruled out for myocardial infarction.  He had no further complaints of chest pain.  He was able to ambulate without difficulty.  With the patient's cardiac catheterizations within the last year and ruling out for myocardial infarction, Dr. Burt Knack decided further ischemic workup with outpatient Myoview.  Throughout the evening, the patient did have a run of nonsustained ventricular tachycardia.  Low-dose Lopressor was added to the patient's medical regimen.  The patient was also noted to be mildly hypertensive and hopefully addition of the Lopressor will aid in this.  The patient's tobacco cessation was again encouraged.  He also has increased caffeine intake and this was also discussed with the patient to decrease if possible.  On day of discharge, Dr. Burt Knack evaluated the patient and noted him stable for home with close outpatient follow up.  Addition of metoprolol was discussed with the patient and followup plans were also discussed and he voiced understanding.  DISCHARGE LABORATORY DATA:  Sodium 137, potassium 3.9, chloride 103, bicarb  27, BUN 10, creatinine 1.61.  WBC 7.3, hemoglobin 12.3, hematocrit 39.9, platelet 170,000.  Cardiac enzymes negative x2.  Point- of-care negative x1.  Cholesterol 96, triglycerides 121, HDL 27, LDL 45.  FOLLOWUP PLANS AND INSTRUCTIONS: 1. The patient will follow up with an outpatient stress test on     April 08, 2010, at 11:30.  He is not to eat or 12 hours prior to     the exam and no caffeine 24 hours before the test. 2. The patient will follow up with Dr. Burt Knack on April 15, 2010, at     2:15. 3. The patient is to follow up with Dr. Brigitte Pulse as previously scheduled. 4. The patient is to increase activity slowly and avoid any activity     that causes chest pain or  shortness of breath. 5. The patient is to continue a low-sodium, heart-healthy diet.  DISCHARGE MEDICATIONS: 1. Metoprolol tartrate 25 mg half tablet twice daily. 2. Aspirin 325 mg 1 tablet daily. 3. Diazepam 5 mg 1 to 1-1/2 tablets daily at bedtime as needed for     pain. 4. Lisinopril 5 mg 1 tablet daily. 5. Nitroglycerin sublingual 0.4 mg 1 tablet under tongue every 5     minutes as needed up to 3 doses for chest pain. 6. Plavix 75 mg daily. 7. Risperdal 0.5 mg 1 tablet daily. 8. Zoloft 100 mg 1 tablet daily. 9. Zocor 40 mg 1 tablet daily.  DURATION OF DISCHARGE:  Greater than 30 minutes with physician and physician extender time.     Cecille Amsterdam, PA-C   ______________________________ Juanda Bond. Burt Knack, MD   NB/MEDQ  D:  03/26/2010  T:  03/26/2010  Job:  [id number]  cc:   Janalyn Rouse, M.D.  Electronically Signed by Pennie Rushing P.A. on 04/01/2010 06:27:03 AM Electronically Signed by Sherren Mocha MD on 04/29/2010 08:53:57 PM

## 2010-05-11 LAB — CBC
HCT: 30 % — ABNORMAL LOW (ref 39.0–52.0)
HCT: 38.3 % — ABNORMAL LOW (ref 39.0–52.0)
Hemoglobin: 10.1 g/dL — ABNORMAL LOW (ref 13.0–17.0)
Hemoglobin: 12.8 g/dL — ABNORMAL LOW (ref 13.0–17.0)
MCHC: 33.4 g/dL (ref 30.0–36.0)
MCHC: 33.6 g/dL (ref 30.0–36.0)
MCV: 89.1 fL (ref 78.0–100.0)
MCV: 89.1 fL (ref 78.0–100.0)
Platelets: 167 10*3/uL (ref 150–400)
Platelets: 210 10*3/uL (ref 150–400)
RBC: 3.37 MIL/uL — ABNORMAL LOW (ref 4.22–5.81)
RBC: 4.3 MIL/uL (ref 4.22–5.81)
RDW: 14 % (ref 11.5–15.5)
RDW: 14.3 % (ref 11.5–15.5)
WBC: 17 10*3/uL — ABNORMAL HIGH (ref 4.0–10.5)
WBC: 8.1 10*3/uL (ref 4.0–10.5)

## 2010-05-11 LAB — BASIC METABOLIC PANEL
BUN: 18 mg/dL (ref 6–23)
BUN: 21 mg/dL (ref 6–23)
CO2: 24 mEq/L (ref 19–32)
CO2: 27 mEq/L (ref 19–32)
Calcium: 8.4 mg/dL (ref 8.4–10.5)
Calcium: 9 mg/dL (ref 8.4–10.5)
Chloride: 106 mEq/L (ref 96–112)
Chloride: 112 mEq/L (ref 96–112)
Creatinine, Ser: 1.23 mg/dL (ref 0.4–1.5)
Creatinine, Ser: 1.54 mg/dL — ABNORMAL HIGH (ref 0.4–1.5)
GFR calc Af Amer: 54 mL/min — ABNORMAL LOW (ref >60)
GFR calc Af Amer: >60 mL/min (ref >60)
GFR calc non Af Amer: 45 mL/min — ABNORMAL LOW (ref >60)
GFR calc non Af Amer: 58 mL/min — ABNORMAL LOW (ref >60)
Glucose, Bld: 101 mg/dL — ABNORMAL HIGH (ref 70–99)
Glucose, Bld: 127 mg/dL — ABNORMAL HIGH (ref 70–99)
Potassium: 4.1 mEq/L (ref 3.5–5.1)
Potassium: 6.1 mEq/L — ABNORMAL HIGH (ref 3.5–5.1)
Sodium: 138 mEq/L (ref 135–145)
Sodium: 140 mEq/L (ref 135–145)

## 2010-05-12 LAB — CBC
HCT: 44.3 % (ref 39.0–52.0)
Hemoglobin: 15.3 g/dL (ref 13.0–17.0)
MCHC: 34.5 g/dL (ref 30.0–36.0)
MCV: 88.6 fL (ref 78.0–100.0)
Platelets: 239 10*3/uL (ref 150–400)
RBC: 5 MIL/uL (ref 4.22–5.81)
RDW: 13.6 % (ref 11.5–15.5)
WBC: 16.5 10*3/uL — ABNORMAL HIGH (ref 4.0–10.5)

## 2010-05-12 LAB — DIFFERENTIAL
Basophils Absolute: 0.4 10*3/uL — ABNORMAL HIGH (ref 0.0–0.1)
Basophils Relative: 3 % — ABNORMAL HIGH (ref 0–1)
Eosinophils Absolute: 0.3 10*3/uL (ref 0.0–0.7)
Eosinophils Relative: 2 % (ref 0–5)
Lymphocytes Relative: 8 % — ABNORMAL LOW (ref 12–46)
Lymphs Abs: 1.3 10*3/uL (ref 0.7–4.0)
Monocytes Absolute: 1.2 10*3/uL — ABNORMAL HIGH (ref 0.1–1.0)
Monocytes Relative: 7 % (ref 3–12)
Neutro Abs: 13.3 10*3/uL — ABNORMAL HIGH (ref 1.7–7.7)
Neutrophils Relative %: 81 % — ABNORMAL HIGH (ref 43–77)

## 2010-05-12 LAB — BASIC METABOLIC PANEL
BUN: 17 mg/dL (ref 6–23)
CO2: 27 mEq/L (ref 19–32)
Calcium: 9.6 mg/dL (ref 8.4–10.5)
Chloride: 106 mEq/L (ref 96–112)
Creatinine, Ser: 1.6 mg/dL — ABNORMAL HIGH (ref 0.4–1.5)
GFR calc Af Amer: 52 mL/min — ABNORMAL LOW (ref >60)
GFR calc non Af Amer: 43 mL/min — ABNORMAL LOW (ref >60)
Glucose, Bld: 123 mg/dL — ABNORMAL HIGH (ref 70–99)
Potassium: 4.4 mEq/L (ref 3.5–5.1)
Sodium: 143 mEq/L (ref 135–145)

## 2010-05-14 ENCOUNTER — Ambulatory Visit (HOSPITAL_COMMUNITY)
Admission: RE | Admit: 2010-05-14 | Discharge: 2010-05-14 | Disposition: A | Discharge status: Home / Self Care | Payer: Medicare Other | Source: Ambulatory Visit | Attending: Internal Medicine | Admitting: Internal Medicine

## 2010-05-14 DIAGNOSIS — R0609 Other forms of dyspnea: Secondary | ICD-10-CM | POA: Insufficient documentation

## 2010-05-14 DIAGNOSIS — R0989 Other specified symptoms and signs involving the circulatory and respiratory systems: Secondary | ICD-10-CM | POA: Insufficient documentation

## 2010-05-14 LAB — POCT I-STAT, CHEM 8
BUN: 18 mg/dL (ref 6–23)
Calcium, Ion: 1.24 mmol/L (ref 1.12–1.32)
Chloride: 106 mEq/L (ref 96–112)
Creatinine, Ser: 1.5 mg/dL (ref 0.4–1.5)
Glucose, Bld: 101 mg/dL — ABNORMAL HIGH (ref 70–99)
HCT: 40 % (ref 39.0–52.0)
Hemoglobin: 13.6 g/dL (ref 13.0–17.0)
Potassium: 4.3 mEq/L (ref 3.5–5.1)
Sodium: 141 mEq/L (ref 135–145)
TCO2: 27 mmol/L (ref 0–100)

## 2010-05-14 LAB — CBC
HCT: 35.4 % — ABNORMAL LOW (ref 39.0–52.0)
Hemoglobin: 12.1 g/dL — ABNORMAL LOW (ref 13.0–17.0)
MCHC: 34.3 g/dL (ref 30.0–36.0)
MCV: 89.5 fL (ref 78.0–100.0)
Platelets: 205 10*3/uL (ref 150–400)
RBC: 3.96 MIL/uL — ABNORMAL LOW (ref 4.22–5.81)
RDW: 14.1 % (ref 11.5–15.5)
WBC: 10.2 10*3/uL (ref 4.0–10.5)

## 2010-05-14 LAB — COMPREHENSIVE METABOLIC PANEL
ALT: 11 U/L (ref 0–53)
AST: 18 U/L (ref 0–37)
Albumin: 3.4 g/dL — ABNORMAL LOW (ref 3.5–5.2)
Alkaline Phosphatase: 56 U/L (ref 39–117)
BUN: 14 mg/dL (ref 6–23)
CO2: 24 mEq/L (ref 19–32)
Calcium: 9.1 mg/dL (ref 8.4–10.5)
Chloride: 109 mEq/L (ref 96–112)
Creatinine, Ser: 1.45 mg/dL (ref 0.4–1.5)
GFR calc Af Amer: 58 mL/min — ABNORMAL LOW (ref >60)
GFR calc non Af Amer: 48 mL/min — ABNORMAL LOW (ref >60)
Glucose, Bld: 93 mg/dL (ref 70–99)
Potassium: 4.3 mEq/L (ref 3.5–5.1)
Sodium: 138 mEq/L (ref 135–145)
Total Bilirubin: 0.4 mg/dL (ref 0.3–1.2)
Total Protein: 6.7 g/dL (ref 6.0–8.3)

## 2010-05-14 LAB — CK TOTAL AND CKMB (NOT AT ARMC)
CK, MB: 0.8 ng/mL (ref 0.3–4.0)
Relative Index: INVALID (ref 0.0–2.5)
Total CK: 86 U/L (ref 7–232)

## 2010-05-14 LAB — TROPONIN I: Troponin I: 0.02 ng/mL (ref 0.00–0.06)

## 2010-05-14 LAB — PROTIME-INR
INR: 1.17 (ref 0.00–1.49)
Prothrombin Time: 14.8 seconds (ref 11.6–15.2)

## 2010-05-14 LAB — TSH: TSH: 3.394 u[IU]/mL (ref 0.350–4.500)

## 2010-05-14 LAB — APTT: aPTT: 37 seconds (ref 24–37)

## 2010-05-16 LAB — BASIC METABOLIC PANEL
BUN: 13 mg/dL (ref 6–23)
CO2: 24 mEq/L (ref 19–32)
Calcium: 8.8 mg/dL (ref 8.4–10.5)
Chloride: 108 mEq/L (ref 96–112)
Creatinine, Ser: 1.53 mg/dL — ABNORMAL HIGH (ref 0.4–1.5)
GFR calc Af Amer: 55 mL/min — ABNORMAL LOW (ref >60)
GFR calc non Af Amer: 45 mL/min — ABNORMAL LOW (ref >60)
Glucose, Bld: 91 mg/dL (ref 70–99)
Potassium: 4.3 mEq/L (ref 3.5–5.1)
Sodium: 137 mEq/L (ref 135–145)

## 2010-05-16 LAB — CBC
HCT: 31.9 % — ABNORMAL LOW (ref 39.0–52.0)
Hemoglobin: 10.9 g/dL — ABNORMAL LOW (ref 13.0–17.0)
MCHC: 34.3 g/dL (ref 30.0–36.0)
MCV: 89.1 fL (ref 78.0–100.0)
Platelets: 175 10*3/uL (ref 150–400)
RBC: 3.58 MIL/uL — ABNORMAL LOW (ref 4.22–5.81)
RDW: 13.8 % (ref 11.5–15.5)
WBC: 9.3 10*3/uL (ref 4.0–10.5)

## 2010-05-16 LAB — CARDIAC PANEL(CRET KIN+CKTOT+MB+TROPI)
CK, MB: 0.8 ng/mL (ref 0.3–4.0)
CK, MB: 0.9 ng/mL (ref 0.3–4.0)
Relative Index: INVALID (ref 0.0–2.5)
Relative Index: INVALID (ref 0.0–2.5)
Total CK: 73 U/L (ref 7–232)
Total CK: 83 U/L (ref 7–232)
Troponin I: 0.03 ng/mL (ref 0.00–0.06)
Troponin I: 0.04 ng/mL (ref 0.00–0.06)

## 2010-05-16 LAB — LIPID PANEL
Cholesterol: 80 mg/dL (ref 0–200)
HDL: 27 mg/dL — ABNORMAL LOW (ref >39)
LDL Cholesterol: 35 mg/dL (ref 0–99)
Total CHOL/HDL Ratio: 3 RATIO
Triglycerides: 89 mg/dL (ref <150)
VLDL: 18 mg/dL (ref 0–40)

## 2010-06-02 LAB — CARDIAC PANEL(CRET KIN+CKTOT+MB+TROPI)
CK, MB: 134.9 ng/mL — ABNORMAL HIGH (ref 0.3–4.0)
CK, MB: 268.2 ng/mL — ABNORMAL HIGH (ref 0.3–4.0)
CK, MB: 66.7 ng/mL — ABNORMAL HIGH (ref 0.3–4.0)
Relative Index: 7.5 — ABNORMAL HIGH (ref 0.0–2.5)
Relative Index: 9 — ABNORMAL HIGH (ref 0.0–2.5)
Relative Index: 9.7 — ABNORMAL HIGH (ref 0.0–2.5)
Total CK: 1491 U/L — ABNORMAL HIGH (ref 7–232)
Total CK: 2769 U/L — ABNORMAL HIGH (ref 7–232)
Total CK: 888 U/L — ABNORMAL HIGH (ref 7–232)
Troponin I: 85.51 ng/mL (ref 0.00–0.06)
Troponin I: >100 ng/mL (ref 0.00–0.06)
Troponin I: >96 ng/mL (ref 0.00–0.06)

## 2010-06-02 LAB — BASIC METABOLIC PANEL
BUN: 11 mg/dL (ref 6–23)
BUN: 12 mg/dL (ref 6–23)
CO2: 24 mEq/L (ref 19–32)
CO2: 26 mEq/L (ref 19–32)
Calcium: 8.8 mg/dL (ref 8.4–10.5)
Calcium: 9.1 mg/dL (ref 8.4–10.5)
Chloride: 109 mEq/L (ref 96–112)
Chloride: 110 mEq/L (ref 96–112)
Creatinine, Ser: 1.15 mg/dL (ref 0.4–1.5)
Creatinine, Ser: 1.23 mg/dL (ref 0.4–1.5)
GFR calc Af Amer: >60 mL/min (ref >60)
GFR calc Af Amer: >60 mL/min (ref >60)
GFR calc non Af Amer: 58 mL/min — ABNORMAL LOW (ref >60)
GFR calc non Af Amer: >60 mL/min (ref >60)
Glucose, Bld: 103 mg/dL — ABNORMAL HIGH (ref 70–99)
Glucose, Bld: 95 mg/dL (ref 70–99)
Potassium: 3.5 mEq/L (ref 3.5–5.1)
Potassium: 4 mEq/L (ref 3.5–5.1)
Sodium: 137 mEq/L (ref 135–145)
Sodium: 141 mEq/L (ref 135–145)

## 2010-06-02 LAB — CBC
HCT: 33 % — ABNORMAL LOW (ref 39.0–52.0)
HCT: 33.6 % — ABNORMAL LOW (ref 39.0–52.0)
Hemoglobin: 11.2 g/dL — ABNORMAL LOW (ref 13.0–17.0)
Hemoglobin: 11.4 g/dL — ABNORMAL LOW (ref 13.0–17.0)
MCHC: 33.9 g/dL (ref 30.0–36.0)
MCHC: 34 g/dL (ref 30.0–36.0)
MCV: 88.2 fL (ref 78.0–100.0)
MCV: 88.6 fL (ref 78.0–100.0)
Platelets: 168 10*3/uL (ref 150–400)
Platelets: 179 10*3/uL (ref 150–400)
RBC: 3.73 MIL/uL — ABNORMAL LOW (ref 4.22–5.81)
RBC: 3.81 MIL/uL — ABNORMAL LOW (ref 4.22–5.81)
RDW: 14.2 % (ref 11.5–15.5)
RDW: 14.7 % (ref 11.5–15.5)
WBC: 11.6 10*3/uL — ABNORMAL HIGH (ref 4.0–10.5)
WBC: 9.8 10*3/uL (ref 4.0–10.5)

## 2010-06-02 LAB — POCT I-STAT, CHEM 8
BUN: 15 mg/dL (ref 6–23)
Calcium, Ion: 1.23 mmol/L (ref 1.12–1.32)
Chloride: 108 mEq/L (ref 96–112)
Creatinine, Ser: 1.4 mg/dL (ref 0.4–1.5)
Glucose, Bld: 147 mg/dL — ABNORMAL HIGH (ref 70–99)
HCT: 41 % (ref 39.0–52.0)
Hemoglobin: 13.9 g/dL (ref 13.0–17.0)
Potassium: 3.6 mEq/L (ref 3.5–5.1)
Sodium: 141 mEq/L (ref 135–145)
TCO2: 21 mmol/L (ref 0–100)

## 2010-06-02 LAB — TSH: TSH: 0.774 u[IU]/mL (ref 0.350–4.500)

## 2010-06-02 LAB — LIPID PANEL
Cholesterol: 118 mg/dL (ref 0–200)
HDL: 21 mg/dL — ABNORMAL LOW (ref >39)
LDL Cholesterol: 73 mg/dL (ref 0–99)
Total CHOL/HDL Ratio: 5.6 RATIO
Triglycerides: 119 mg/dL (ref <150)
VLDL: 24 mg/dL (ref 0–40)

## 2010-06-02 LAB — HEMOGLOBIN A1C
Hgb A1c MFr Bld: 5.2 % (ref 4.6–6.1)
Mean Plasma Glucose: 103 mg/dL

## 2010-06-02 LAB — BRAIN NATRIURETIC PEPTIDE: Pro B Natriuretic peptide (BNP): 180 pg/mL — ABNORMAL HIGH (ref 0.0–100.0)

## 2010-07-08 NOTE — Discharge Summary (Signed)
[name], [name] NO.:  [id number]   MEDICAL RECORD NO.:  [id number]          PATIENT TYPE:  INP   LOCATION:  2029                         FACILITY:  [facility]   PHYSICIAN:  Thompson Grayer, MD       DATE OF BIRTH:  May 11, 1940   DATE OF ADMISSION:  08/15/2008  DATE OF DISCHARGE:  08/18/2008                               DISCHARGE SUMMARY   PRIMARY CARDIOLOGIST:  Juanda Bond. Burt Knack, MD   PRIMARY CARE DOCTOR:  Monico Blitz, MD   DISCHARGE DIAGNOSES:  1. Coronary artery disease, status post acute inferoposterior      myocardial infarction secondary to right coronary artery occlusion.      This admission, the patient underwent cardiac catheterization and      successful percutaneous intervention of the right coronary artery      using a single bare metal stent.  Otherwise, he was found to have      severe mid left circumflex stenosis, nonobstructive LAD stenosis,      mid segmental left ventricular dysfunction with overall preserved      left ventricular function.  The patient will need dual antiplatelet      therapy with aspirin and Plavix ideally for 12 months if possible.  2. Bradycardia.  No beta-blocker advised initially.  The patient to      follow up outpatient for further recommendations.  3. Soft blood pressure.  Lisinopril resumed at a lower dose.  4. Tobacco abuse.  Smoking cessation education provided.   PAST MEDICAL HISTORY:  1. Cerebrovascular accident x2 in [place].  2. Hypertension.  3. Anxiety disorder.  4. Neuropathy.  5. Gastroesophageal reflux disease.  6. History of colon polyps.  7. Cholelithiasis with elevated LFTs.  8. Renal cyst diagnosed by ultrasound.  9. History of benign prostatic hypertrophy.  10.History of leukocytosis, status post hematology evaluation felt to      be reactive  11.Dyslipidemia.  12.Status post colonoscopy.  13.Status post cholecystectomy.  14.Ongoing tobacco abuse.   Mr. Schlieper is a 70 year old male with  no previous history of coronary  artery disease.  Day of admission, he woke with complaints of chest  discomfort associated with shortness of breath, nausea.  Symptoms did  not resolve.  EMS was notified.  The patient was treated with  nitroglycerin, aspirin.  EKG consistent with acute inferior MI.  He was  transported directly to the [facility] Cardiac C[facility].  Cardiac catheterization and results as stated above.  The patient  tolerated procedure without complications, noted to have marked sinus  brady; however, beta-blocker not initiated.  Soft blood pressure  requiring adjustments in ACE inhibitor.  A 2-D echocardiogram obtained  post intervention, EF 45%.  Posterior wall appeared hypokinetic, basal  to mid inferior wall akinetic, grade 1 diastolic dysfunction.  Care  coordinator noted the patient lives at home alone with very attentive  daughter.  Recommend the patient ambulate with Weissberg versus cane.  The  patient also was seen by Cardiac Rehab and Physical Therapy with same  recommendations as stated above.  The patient being discharged home  after evaluated by Dr. Rayann Heman on day of discharge.  Cath site stable.   Medications at time of discharge include Plavix 75 daily, aspirin 325,  Zoloft 200, Risperdal 0.5 b.i.d., Valium 5 mg, he takes one-half tablets  at bedtime, Zocor 40 mg at bedtime.  Lisinopril has been decreased to 5  mg daily.  Hydrocodone as previously prescribed, nitroglycerin as  previously prescribed.  The patient has been given the post cardiac  catheterization.   DISCHARGE INSTRUCTIONS:  We will follow up with Dr. Burt Knack.  Our office  will call him with appointment date and time.  The patient to call Dr.  Manuella Ghazi for followup appointment.  Duration of discharge encountered well  over 30 minutes.      Rosanne Sack, ACNP      Thompson Grayer, MD  Electronically Signed    MB/MEDQ  D:  08/18/2008  T:  08/18/2008  Job:  [id number]   cc:   Monico Blitz, MD

## 2010-07-08 NOTE — Assessment & Plan Note (Signed)
OFFICE VISIT   [name], [name]  DOB:  Aug 19, 1940                                       01/30/2009  [id number]   The patient is a 70 year old male that I have been following for carotid  stenosis as well as lower extremity pain.  He was last seen in June of  2009.  At that time he complained of bilateral foot pain but no  claudication symptoms.  This had been present for 7-8 years.  Unfortunately he continues to smoke about a half a pack of cigarettes  per day.  He has smoked for greater than 50 years.  We have also been  following him for moderate carotid stenosis.  He denies any new symptoms  of TIA, amaurosis or stroke.  His primary complaint is of his lower  extremities.  He states that both feet hurt all of the time.  He had an  operation on his right foot 5 years ago by [name].  He stated it  did not really improve his pain.  He states the right foot is slightly  worse than the left.  He has pain in both feet after walking  approximately 20 yards.  He is minimally ambulatory because of this.  He  also has some mild depression and does not really have much activity  around the house.  He lives alone.  He denies any numbness in the feet.  He has no ulcerations on the feet.   CHRONIC MEDICAL PROBLEMS:  Include hypertension, coronary artery disease  both of which are followed by Dr. Brigitte [name] and are stable in character.   FAMILY HISTORY:  Unremarkable.   SOCIAL HISTORY:  He is single.  Smoking history as listed above.  He  does not consume alcohol regularly.   REVIEW OF SYSTEMS:  Full 12 point review of systems was performed with  the patient.  Please see intake referral form for details regarding  this.  Of note, the patient does have chronic dysphagia from a previous  stroke which requires him to spit in a cup frequently to control his  secretions.   MEDICATIONS:  Were reviewed with the patient.  He continues to take  aspirin and Plavix  for antiplatelet therapy.   ALLERGIES:  He has no known drug allergies.   PHYSICAL EXAM:  Vital signs:  Blood pressure is 145/76 in the right arm,  temperature is 98.2, heart rate 67 and regular.  HEENT:  Unremarkable.  Neck:  Has 2+ carotid pulses without bruit.  Chest:  Clear to  auscultation.  Cardiac:  Exam is regular rate and rhythm without murmur.  Abdomen:  Is soft, nontender, nondistended.  No masses.  Extremities:  He has 2+ radial pulses bilaterally.  He has absent femoral, popliteal  and pedal pulses bilaterally.  He has no significant edema in the lower  extremities.  Skin:  Has no ulcers or rashes.  Neurologic:  Exam shows  slightly decreased strength in the left leg and left upper extremity  compared to the right.   He had bilateral ABIs performed today which were 0.45 on the right, 1.20  on the left.  He had monophasic waveforms in the lower extremities.  He  did have some evidence of vessel calcification.   He had a duplex ultrasound of his carotid arteries performed today which  showed a less than 60% left internal carotid artery stenosis and a less  than 40% right internal carotid artery stenosis.   Records from Dr. Raul [name] office were reviewed as well as his previous  medical records from office visits here.   As far as his carotid disease is concerned, the patient continues to  remain asymptomatic and has a mild to moderate carotid stenosis.  I  believe the best option for this is continued carotid surveillance with  every 6 month ultrasound.  As far as the lower extremities are  concerned, I still believe that his foot pain is multifactorial in  nature probably some neuropathy and some arthritis.  Although I did have  difficulty palpating his femoral pulses today I imagine this is more  likely secondary to calcification than actual aortoiliac occlusive  disease.  I believe the best option would be to obtain a CT angiogram so  we can identify his entire vascular  tree and see the extent of his  underlying atherosclerotic occlusive disease.  The patient is not very  active overall and I would not be inclined to do a revascularization  procedure unless he has an aortoiliac segment that would be easily  approachable with a percutaneous option or if he develops limb  threatening ischemia which he currently does not have.  He will follow  up with me after his CT angiogram in a few weeks.     James Oto. Fields, MD  Electronically Signed   CEF/MEDQ  D:  01/30/2009  T:  01/31/2009  Job:  [id number]   cc:   [name], M.D.

## 2010-07-08 NOTE — H&P (Signed)
NAME:  [name], [name]              ACCOUNT NO.:  [id number]   MEDICAL RECORD NO.:  [id number]          PATIENT TYPE:  AMB   LOCATION:  CATH                         FACILITY:  [facility]   PHYSICIAN:  Juanda Bond. Burt Knack, Hilda OF BIRTH:  1940/04/28   DATE OF ADMISSION:  08/15/2008  DATE OF DISCHARGE:                              HISTORY & PHYSICAL   PRIMARY CARE PHYSICIAN:  Janalyn Rouse, MD   PRIMARY CARDIOLOGIST:  Juanda Bond. Burt Knack, MD   CHIEF COMPLAINT:  Chest pain.   HISTORY OF PRESENT ILLNESS:  [name] is a 70 year old male with no  previous history of coronary artery disease.  He woke this a.m. with  chest pain at about 6:30.  It reached to 10/10.  It was associated with  shortness of breath, diaphoresis, a little nausea but no vomiting.  When  his symptoms did not resolve, EMS was called.  EMS gave him sublingual  nitroglycerin x3 and aspirin 81 mg x4 with no resolution to his pain.  His EKG was consistent with an acute inferior MI.  He was transported  directly to [facility] cath lab.  On arrival to cath lab, his chest pain  was 5/10.  He has never had chest pain like this before.  Up until this  morning he was in his usual state of health.   PAST MEDICAL HISTORY:  1. Status post CVA x2, 1994 and 1995.  2. Hypertension.  3. History of anxiety disorder.  4. History of neuropathy.  5. Gastroesophageal reflux disease.  6. History of colon polyps.  7. Cholelithiasis with elevated liver function tests.  8. Renal cysts, seen on ultrasound.  9. History of BPH.  10.History of leukocytosis status post hematology evaluation and felt      reactive.  11.Hyperlipidemia.   PAST SURGICAL HISTORY:  He is status post colonoscopy and  cholecystectomy.   ALLERGIES:  None.   CURRENT MEDICATIONS:  1. Hydrocodone 2 tablets b.i.d. p.r.n.  2. Valium 5 mg q.i.d.  3. Risperdal 0.5 mg b.i.d.  4. Sertraline 100 mg daily.  5. Zocor 20 mg a day.  6. Lisinopril 20 mg daily, not clear if  he is taking this.   SOCIAL HISTORY:  He lives in [place] alone.  His daughter lives  nearby.  He is retired from [employer].  He has a greater  than 50-pack year history of ongoing tobacco use but denies alcohol or  drug abuse.   FAMILY HISTORY:  His mother has a history of CVA and is in a nursing  home.  His father died of a CVA at age 24.  He has got one brother that  has had an MI and other brothers with heart disease.   REVIEW OF SYSTEMS:  He has chronic dyspnea on exertion and cough but  denies wheezing.  He has some chronic oral drainage which he spits out  from his CVA and may have some problems with dysphagia as well.  He does  not have chronic reflux symptoms and denies melena.  He has had some  weakness and gait problems  as well as some dysarthria since the CVA.  Review of systems is otherwise negative.   PHYSICAL EXAMINATION:  VITAL SIGNS:  He is afebrile.  Blood pressure  157/84, pulse 69, respiratory rate 14, and O2 saturation 94% on 2 L.  GENERAL:  He is a well-developed elderly white male in acute distress.  HEENT:  Essentially normal.  NECK:  There is no lymphadenopathy, thyromegaly, or JVD noted.  CV:  His heart is regular in rate and rhythm with an S1-S2 and no  significant murmur, rub, or gallop is noted.  Distal pulses are slightly  decreased in the lower extremities but palpable and he has a right  femoral bruit noted.  LUNGS:  He has some rales in the bases but they are generally clear.  SKIN:  No rashes or lesions are noted.  ABDOMEN:  Soft and nontender with active bowel sounds.  EXTREMITIES:  There is no cyanosis, clubbing, or edema noted.  MUSCULOSKELETAL:  There is no joint deformity or effusions noted.  NEUROLOGIC:  He is alert and oriented.  Cranial nerves II-XII grossly  intact.   EKG is sinus rhythm, rate 75 with inferior ST elevation and reciprocal  lateral changes.  Chest x-ray and labs are pending.   IMPRESSION:  Acute inferior  myocardial infarction:  The patient was seen  and evaluated by Dr. Burt Knack.  He is in the cath lab and currently  undergoing cardiac catheterization.  Further evaluation and treatment  will depend on the results and we will evaluate his cardiac risk factor  profile as well.  He will be continued on his home medications.      Rosaria Ferries, PA-C      Juanda Bond. Burt Knack, MD  Electronically Signed    RB/MEDQ  D:  08/15/2008  T:  08/16/2008  Job:  [id number]   cc:   Janalyn Rouse, M.D.

## 2010-07-08 NOTE — Procedures (Signed)
CAROTID DUPLEX EXAM   INDICATION:  Followup carotid artery disease.   HISTORY:  Diabetes:  No.  Cardiac:  No.  Hypertension:  No.  Smoking:  Yes.  Previous Surgery:  No.  CV History:  History of CVA in 1994 and 1995 with multiple TIAs.  Amaurosis Fugax No, Paresthesias No, Hemiparesis No                                       RIGHT             LEFT  Brachial systolic pressure:         138               139  Brachial Doppler waveforms:         Normal            Normal  Vertebral direction of flow:        Not visualized    Antegrade  DUPLEX VELOCITIES (cm/sec)  CCA peak systolic                   58                58  ECA peak systolic                   80                91  ICA peak systolic                   55                [id number]  ICA end diastolic                   18                46  PLAQUE MORPHOLOGY:                  Heterogenous      Heterogenous  PLAQUE AMOUNT:                      Mild              Moderate  PLAQUE LOCATION:                    ICA/ECA/CCA       ICA/ECA/CCA   IMPRESSION:  1. 1-39% stenosis of the right internal carotid artery.  2. 40-59% stenosis of the left internal carotid artery.  3. The right vertebral artery was not visualized.  4. Significant increase in Doppler velocities of the left ICA noted      since previous exam on 06/05/2006 with no change noted on the right      side.   ___________________________________________  Jessy Oto Fields, MD   CH/MEDQ  D:  08/17/2007  T:  08/17/2007  Job:  [id number]

## 2010-07-08 NOTE — Assessment & Plan Note (Signed)
OFFICE VISIT   FINNLY, BRIETZKE  DOB:  October 13, 1940                                       02/21/2009  [id number]   The patient returns today for further followup.  He was last seen on  January 30, 2009.  He has had chronic complaints of bilateral foot pain  with no claudication symptoms.  These have been present and constant for  approximately 7-8 years.  He continues to smoke half a pack of  cigarettes per day.  He returns today for further evaluation and review  of his CT angiogram.   On physical exam today, blood pressure is 140/84 in the left arm, heart  rate is 71 and regular, respiration 16.  He has absent femoral pulses  bilaterally.  Feet are pink, warm and reasonably well-perfused.  Abdomen  is nontender, nondistended.  No masses.   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  Below this he has patent  superficial femoral, popliteal and tibial vessels.  I discussed with the  patient today the possibility of an arteriogram for potential  angioplasty and stenting of his iliac arteries.  However, I do not  believe that these stenoses are the cause of his foot pain.  However, I  told him that I would be willing to treat these lesions as it would be a  fairly small procedure with fairly low risk.  However, the patient has  decided he does not want to undergo the arteriogram right now and wishes  for continued observation at this point.  He will follow up for repeat  ABIs in 3 months' time.     Jessy Oto. Fields, MD  Electronically Signed   CEF/MEDQ  D:  02/22/2009  T:  02/26/2009  Job:  [id number]   cc:   Janalyn Rouse, M.D.

## 2010-07-08 NOTE — Procedures (Signed)
LOWER EXTREMITY ARTERIAL DUPLEX   INDICATION:  Followup stent placement.   HISTORY:  Diabetes:  No.  Cardiac:  No.  Hypertension:  Yes.  Smoking:  Yes.  Previous Surgery:  Right external iliac artery stent, left common iliac  artery stent placed 05/31/2009.   SINGLE LEVEL ARTERIAL EXAM                          RIGHT                LEFT  Brachial:               157                  151  Anterior tibial:        90                   >255  Posterior tibial:       95                   >255  Peroneal:  Ankle/Brachial Index:   0.61                 Noncompressible   LOWER EXTREMITY ARTERIAL DUPLEX EXAM   DUPLEX:  Bilateral lower extremity stents appear patent with velocities  described on the following worksheet.   IMPRESSION:  1. Patent right external iliac artery stent and patent left common      iliac artery stent, with biphasic waveforms.  2. Dampened toe brachial waveforms bilaterally.   ___________________________________________  Jessy Oto Fields, MD   EM/MEDQ  D:  12/27/2009  T:  12/27/2009  Job:  [id number]

## 2010-07-08 NOTE — Assessment & Plan Note (Signed)
OFFICE VISIT   TROY, SOLDO  DOB:  13-Dec-1940                                       08/17/2007  CHART#:[id number]   A 70 year old male with complaints of bilateral foot pain.  He denies  any calf pain with ambulation.  The foot pain is constant in nature.  It  has been present for 7 to 8 years.  He denies any history of diabetes or  neuropathy.  The pain has not significantly changed any over the last 7  to 8 years and he states it has been fairly chronic and constant.  He  states the pain is usually relieved with elevation and pain medicine.  He has had several injections of his feet, which did not help with pain  relief.  His atherosclerotic risk factors include hypertension and  smoking.  He currently smokes 1 pack of cigarettes per day.  He has  smoked for greater than 50 years.   PAST SURGICAL HISTORY:  He had a right ankle fracture and a left ankle  fracture, both of which required pins.  He has also had a  cholecystectomy.   MEDICATIONS:  Include sertraline 100 mg 1 tablet nightly.  Diazepam 5 mg  1 to 2 daily.  Risperidone 0.5 mg nightly.  Hydrocodone 5/500 two  tablets every day.  Aspirin 81 mg once a day.   He has no known drug allergies.   PAST MEDICAL HISTORY:  Significant for 2 prior strokes in 1993 and 1994.  This left him with residual balance issues and he staggers with  ambulation.   FAMILY HISTORY:  Unremarkable.   SOCIAL HISTORY:  He is single.  Has 2 children.  Smoking history is as  above.  He does not consume alcohol regularly.   REVIEW OF SYSTEMS:  He is 6 feet tall, 170 pounds.  He has had some recent weight loss and loss of appetite.  CARDIAC:  He has some shortness of breath with exertion.  PULMONARY:  Negative.  GI:  He has a history of reflux, some mild dysphagia since his stroke,  and constipation.  RENAL:  He has some urinary frequency.  VASCULAR:  As mentioned above.  NEUROLOGIC:  He has some dizziness and  balance issues from his stroke.  ORTHOPEDIC:  He has multiple joint arthritis and muscle pain.  PSYCHIATRIC:  He has some anxiety and depression.  ENT and hematologic review of systems are negative.   PHYSICAL EXAM:  Blood pressure is 152/74 in the left arm, heart rate is  66 and regular.  HEENT is unremarkable.  He has bilateral soft carotid  bruits.  Chest is clear to auscultation.  Cardiac exam is regular rate  and rhythm with no murmur.  Abdomen is soft, nontender, and nondistended  with no masses.  Extremities, he has a 1+ right femoral and a 2+ left  femoral pulse.  He has absent 2+ popliteal and pedal pulses bilaterally.  He has trace edema in the lower extremities bilaterally.  This is  primarily in the feet.  He has no ulcerations in the feet.  The feet are  pink, warm, and well-perfused.   He had bilateral ABIs performed today, which are 0.58 on the right and  0.87 on the left.  He also had evidence of non-compressibility and  calcification of these vessels.  In addition,  he had a carotid duplex  exam, since he had not had one of these since 2006.  This showed no  significant stenosis in the right internal carotid artery and less than  60% stenosis of the left internal carotid artery.  Left vertebral artery  flow is antegrade.  Right vertebral artery was not visualized.   In summary, the patient has chronic bilateral foot pain.  He has ABIs  which would be consistent with claudication.  However, he does not  really have claudication symptoms.  I do not believe that his foot pain  can be explained totally by arterial occlusive disease alone.  Although  he does have evidence on physical exam of probably some iliac and  superficial femoral artery occlusive disease, I do not believe repairing  these is going to relieve his foot pain.  He does not describe classic  claudication symptoms, so I do not believe he would benefit from an  arterial intervention at this point.  He has  mild bilateral carotid  occlusive disease.  I believe the best management for him is risk factor  modification, including smoking cessation, continued management of his  hypertension.  He will follow up with me in 6 months' time for repeat  ABIs to further evaluate his lower extremities.  He will also follow up  in 1 year's time for repeat carotid duplex exam.  He will continue to  take his aspirin 81 mg once a day.   Jessy Oto. Fields, MD  Electronically Signed   CEF/MEDQ  D:  08/17/2007  T:  08/18/2007  Job:  [id number]   cc:   Weber Cooks, M.D.

## 2010-07-08 NOTE — Cardiovascular Report (Signed)
[name], APO NO.:  [id number]   MEDICAL RECORD NO.:  [id number]          PATIENT TYPE:  INP   LOCATION:  2912                         FACILITY:  [facility]   PHYSICIAN:  Juanda Bond. Burt Knack, MD  DATE OF BIRTH:  09/01/1940   DATE OF PROCEDURE:  08/15/2008  DATE OF DISCHARGE:                            CARDIAC CATHETERIZATION   PROCEDURE:  Left heart catheterization, selective coronary angiography,  percutaneous transluminal coronary angioplasty and stenting of the right  coronary artery, and left ventriculography.   INDICATION:  The patient is a 70 year old gentleman presenting with an  acute inferior wall MI.  He developed chest pain this morning and called  EMS.  On their arrival, an EKG showed acute inferoposterior myocardial  infarction.  The patient was brought directly to the cardiac cath lab  after a code STEMI was called.  On arrival, he was having ongoing chest  pain and persistent ST elevation on telemetry.  Therefore, emergency  cardiac catheterization was performed.  The patient was given 600 mg of  Plavix just as he was getting onto the cath table.  He received 325 mg  of aspirin en route.   Risks and indications of the procedure were reviewed with the patient.  Both groins were prepped and draped in a normal sterile conditions.  I  accessed the left femoral artery because the pulse was better on the  left side.  Using a modified Seldinger technique, a 6-French sheath was  placed in the left femoral artery.  A 6-French JL4 catheter were used  for diagnostic angiography of the left coronary artery.  A 6-French JR4  guide catheter was inserted to the right coronary artery.  This  demonstrated total occlusion of the proximal RCA.  Angiomax was started  for anticoagulation.  A Cougar guidewire was passed across the lesion  with a moderate amount of difficulty.  There was heavy calcification at  the area of occlusion.  After crossing the lesion and  advancing the wire  into the distal RCA, I initially attempted to cross the lesion with a  2.5 x 15-mm Apex balloon.  The balloon would not cross.  A 1.5 x 15-mm  balloon was then advanced and with a good bit of difficulty, the balloon  finally crossed the lesion.  Multiple dilatations were performed in an  area of heavy calcification.  Reperfusion was achieved after balloon  dilatation.  The 2.5 x 15-mm balloon was then advanced back into the  vessel and a single inflation was done up to 8 atmospheres.  Of note,  the 1.5 balloon was inflated up to 14 atmospheres on multiple  inflations.  When reperfusion occurred, the patient developed severe  hypotension and bradycardia.  He was treated with 1 mg of atropine and  aggressive IV fluid resuscitation.  Once he stabilized, a 3.0 x 24-mm  Liberty bare-metal stent was advanced.  The stent would not cross the  lesion in the proximal RCA.  A [place] wire was then used as a Nature conservation officer.  The stent was then passed over the [place] and again  with  a good bit of difficulty, the stent finally was able to be advanced  across the lesion to cover the entire area of severe stenosis.  The  Cougar wire was removed and the stent was deployed at 14 atmospheres.  It appeared well expanded.  Angiography demonstrated mild residual  stenosis at the proximal edge of the stent, but this did not appear flow  obstructive.  The stent was then postdilated with a 3.25 x 20-mm Quantum  Apex balloon, which was inflated to 16 atmospheres.  After completion of  the procedure, there was TIMI III flow in the vessel and there was good  lesion coverage, and the stent appeared well expanded.  There was 0%  residual stenosis.  The patient tolerated the procedure well.  A femoral  angiogram was performed and this demonstrated calcification and plaque  throughout the femoral artery.  Therefore, a closure device was not  used.  The patient was transferred to the holding  area in guarded  condition where his sheath will be removed in 2 hours following  discontinuation of Angiomax.   FINDINGS:  There was no pressure gradient across the aortic valve.   Right Coronary Artery:  The right coronary artery is calcified.  The  vessel was totally occluded in the proximal segment just beyond the  first RV marginal branch.   Left Coronary Artery:  The left main stem is calcified.  There is no  significant stenosis present in the left main.  The left main bifurcates  into the LAD and left circumflex.   LAD:  The LAD has diffuse plaque throughout.  There are no areas of high-  grade stenosis.  There is moderate calcification throughout the proximal  and mid LAD.  There are 2 diagonal branches, the first of which is  fairly large.  The diagonals have no significant stenoses.  The LAD just  beyond the first septal perforator has a 30-40% stenosis.   Left Circumflex:  The left circumflex is diffusely diseased.  There is a  small and a medium branch present.  Circumflex courses down and supplies  a large first OM that has diffuse plaque with 50% scattered stenoses.  The AV groove circumflex that arises at the bifurcation of first OM has  a severe 80% proximal stenosis right at the bifurcation point.  The  vessel was relatively small and courses down to supply a second and  third OM branch.   Left Ventriculography:  There is severe hypokinesis at the basal and mid  portions of the inferior wall.  The remainder of LV segments are  vigorous.  The LVEF is estimated at 55%.   ASSESSMENT:  1. 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  2. Severe mid left circumflex stenosis.  3. Nonobstructive left anterior descending stenosis.  4. Successful percutaneous intervention of the right coronary artery      using a single bare-metal stent.  5. Mild segmental left ventricular dysfunction with overall preserved      left  ventricular function.   RECOMMENDATIONS:  I recommend dual antiplatelet therapy with aspirin and  Plavix, ideally for 12 months if possible.  The patient will be treated  with routine post myocardial infarction care and will be transferred to  the CCU.  His residual coronary disease can be treated medically.      Juanda Bond. Burt Knack, MD  Electronically Signed     MDC/MEDQ  D:  08/15/2008  T:  08/16/2008  Job:  [id number]   cc:   Janalyn Rouse, M.D.

## 2010-07-08 NOTE — Assessment & Plan Note (Signed)
OFFICE VISIT   [name], [name]  DOB:  Jan 26, 1941                                       05/15/2009  [id number]   CHIEF COMPLAINT:  Leg pain.   HISTORY OF PRESENT ILLNESS:  The patient is a 70 year old male that I  had previously seen for bilateral foot pain.  He was last seen in  December 2010.  At that time he had complaints of chronic bilateral foot  pain but did not describe really any claudication symptoms.  Today he  returns for further followup stating that he develops lower extremity  weakness after walking approximately 25-30 yards.  He states that this  begins in the thigh and works down into the lower legs.  He previously  had a CT angiogram which showed a moderate to high-grade bilateral iliac  stenoses.  He was previously offered an arteriogram to further evaluate  this but refused at that time.  He returns today to reconsider whether  or not an arteriogram might be possible.   His primary atherosclerotic risk factor includes smoking.  He currently  smokes a half-pack of cigarettes per day.  Greater than 3 minutes were  spent in smoking cessation counseling today.  Other chronic medical  problems include hypertension and coronary artery disease.  He had a  myocardial infarction in June 2010.  He has also had 2 prior strokes.  All these medical problems are currently controlled.   PAST SURGICAL HISTORY:  He had a cholecystectomy.   FAMILY HISTORY:  Remarkable for his mother, sisters and brothers having  vascular disease at age less than 86.   SOCIAL HISTORY:  He is single, lives alone.  He has 2 children.  Smoking  history is as listed above.  Alcohol history:  He does not drink.   A full 12-point review of systems was performed with the patient today.  Please see intake referral form for details regarding that.   PHYSICAL EXAMINATION:  Blood pressure is 157/88 in the left arm, heart  rate 66 and regular, respirations 16.  HEENT:   Negative.  Neck:  No  carotid bruit.  No JVD.  Chest:  Clear to auscultation bilaterally.  Cardiac exam is regular rate and rhythm without murmur.  Abdomen:  Soft,  nontender, nondistended.  No masses.  Extremities:  He has absent  femoral, popliteal and pedal pulses bilaterally.  Musculoskeletal exam  shows no major joint deformities.  Neurologic exam shows symmetric upper  extremity and lower extremity motor strength which is 5/5.  He does have  some difficulty swallowing and carries a cup with him to handle his oral  secretions.  He denies any difficulty, however, eating solid foods.  Skin has no ulcers or rashes.   He had bilateral ABIs performed today, which were 0.56 on the right,  1.38 but calcified.  I reviewed his medical records and discharge  summary from his recent admission in late February.  He had a cardiac  cath at that time which showed nonobstructive disease between 30% and  50% in the LAD, circumflex and RCA with no critical in-stent restenosis.  His ejection fraction was 50%-55%.  This also showed an 80% left iliac  stenosis and subtotal occlusion of the right iliac.   In summary, the patient has developed progressive symptoms over time  related to lower extremity arterial  occlusive disease.  At this point he  has decided he wishes to undergo arteriogram with possible angioplasty  and stenting of his iliac arteries.  Risks, benefits, possible  complications and procedure details were explained to him today.  These  included but were not limited to bleeding, infection, vessel injury,  contrast reaction.  This is scheduled for May 31, 2009.   Of note, his current medications include Risperdal, Vicodin,  nitroglycerin, sertraline, Valium, simvastatin, Plavix, lisinopril,  aspirin.   He is allergic or intolerant to Neurontin and Paxil.     James Oto. Fields, MD  Electronically Signed   CEF/MEDQ  [name]:  05/16/2009  T:  05/16/2009  Job:  [id number]   cc:   Juanda Bond.  Burt Knack, [place], M.[name].

## 2010-07-08 NOTE — Procedures (Signed)
CAROTID DUPLEX EXAM   INDICATION:  Follow-up evaluation of carotid disease.   HISTORY:  Diabetes:  No.  Cardiac:  MI on 08/15/08.  Hypertension:  Yes.  Smoking:  Yes.  Previous Surgery:  No.  CV History:  CVA in 1994 and 1995; multiple TIAs.  Amaurosis Fugax No, Paresthesias No, Hemiparesis No.                                       RIGHT             LEFT  Brachial systolic pressure:         130               135  Brachial Doppler waveforms:         WNL               WNL  Vertebral direction of flow:        Not visualized    Antegrade  DUPLEX VELOCITIES (cm/sec)  CCA peak systolic                   84                78  ECA peak systolic                   99                [id number]  ICA peak systolic                   94                [id number]  ICA end diastolic                   35                56  PLAQUE MORPHOLOGY:                  Heterogenous      Heterogenous  PLAQUE AMOUNT:                      Mild              Mild  PLAQUE LOCATION:                    ICA               ICA, ECA   IMPRESSION:  1. Right internal carotid artery suggests 0-39% stenosis.  2. Left internal carotid artery suggests 40-59% stenosis.  3. Right vertebral not visualized, however, left vertebral appears      antegrade.   ___________________________________________  [name]. Fields, MD   CB/MEDQ  D:  01/30/2009  T:  01/30/2009  Job:  [id number]

## 2010-07-11 NOTE — H&P (Signed)
NAME:  [name], [name] NO.:  [id number]   MEDICAL RECORD NO.:  [id number]          PATIENT TYPE:  INP   LOCATION:  0104                         FACILITY:  [facility]   PHYSICIAN:  Precious Reel, MD       DATE OF BIRTH:  [birth year]   DATE OF ADMISSION:  04/22/2006  DATE OF DISCHARGE:                              HISTORY & PHYSICAL   CHIEF COMPLAINT:  Abdominal and chest pain.   HISTORY OF PRESENT ILLNESS:  A 69 year old male with a history of stroke  with a right facial droop and slight dysarthria who presents today with  nondescript symptoms of either chest or abdominal pain depending on who  is talking to him.  The work up was otherwise negative except for  increased white blood cell count and positive D-dimer.  Enzymes were all  negative.  The pain was described as left chest, epigastrium, substernal  and periumbilical.  It started in the morning, was intermittent and woke  him up from sleep at around 3:00 a.m.  It radiated to the chest.  He  finally got to sleep about 5:00 a.m. after taking some Alka-Seltzer.  He  woke up again at 9:00 a.m. had symptoms on and off throughout day.  He  says it was so severe that he called EMS and was taken to the emergency  department.  Pain medications here in the emergency room have been  helpful.  His pain in the emergency room has been doing a lot better.  His blood pressures were very high at home when he was in home.  His  blood pressure currently is low after the morphine.  He reports that he  had a colonoscopy 1-2 years ago and it was fine and was told to follow  up in five years.  He denies any melena, hematochezia, bowel changes.  His last bowel movement was about 1-2 days ago.  He denies any nausea or  vomiting or diarrhea.  He denies any shortness of breath.  He denies any  other symptoms besides the current pain that he has been having  intermittently.  There has been some weakness and fatigue and he  describes one  episode of a fever.  He has been eating, although his  daughter is in the room with him and says that he only eats Boost and  Ensure and has lost over 99 pounds in the last few years and describes  him as relatively anorexic.  He was given Zofran, morphine, IV fluids  and aspirin in the emergency department.  When I took a more detailed  history, again he reports that the pain was periumbilical and severe and  denies any dyspepsia or back or shoulder pain.  Will admit for further  evaluation and treatment.  Of note, the patient has had a swallowing  study in November of 2007, results are unavailable to me.  He has had an  abdominal and pelvic CT scan in October of 2007 which showed bilateral  lower lobe pneumonia, left renal lesions most likely cysts, probable  gallbladder sludge and constipation was noted.  It also appears that he  had an upper GI series in June of 2007 revealing oropharyngeal motility  disorder, pooling, penetration and minimal solid aspiration.  There was  a small hiatal hernia and GE reflux noted.  After my evaluation and  treatment, I agree with admission at this point.   PAST MEDICAL HISTORY:  1. Anxiety.  2. Hypertension.  3. Stroke x2.  4. Smoker.  5. CVI.  6. Peripheral neuropathy.  7. GERD.  8. BPH.  9. History of alcohol use and history of broken ankle in the past.   MEDICATIONS:  1. Zoloft.  2. Valium.  3. Restoril.  4. Lotrel.  5. Aspirin.  6. Vicodin.   ALLERGIES:  NEURONTIN AND PAXIL.   SOCIAL HISTORY:  He smokes, does not drink at the current time.  He has  2 children, lives by himself.   FAMILY HISTORY:  Father died at the age of 54 of a brain aneurysm.  Mother died at an age of 64 of a stroke and hypertension.   REVIEW OF SYSTEMS:  Detailed review of systems obtained, please see HPI  for most details, all other organ systems reviewed and negative.   PHYSICAL EXAMINATION:  VITAL SIGNS:  Temperature 98.2, blood pressure  99/54, down to  88/53, heart rate 68, respiratory rate 98%.  Weight is  162 and is 5 foot 6.5 inches tall and he is down nearly 90-100 pounds  per his report.  GENERAL:  He is alert and oriented x3.  CARDIAC:  Regular.  PULMONARY:  Clear to auscultation bilaterally.  ABDOMEN:  Soft, nontender, nondistended.  Bowel sounds positive and  increased and no rebound or guarding.  EXTREMITIES:  No clubbing, no cyanosis, no edema.  Moving all fours.  HEENT:  Oropharynx is dry.  NECK:  No JVD, no bruit.  NEURO:  There is slight dysarthria and facial droop noted.   ANCILLARY DATA:  White count 11.8, hemoglobin 13.1, platelet count 213.  CK/MB negative and troponin I negative, all these were done x3.  Lipase  43, INR 1.2, D-dimer 1.09.  Chest x-ray no acute cardiopulmonary  disease.  Sodium 136, potassium 3.6, chloride 102, bicarb 28, BUN 22,  creatinine 1.3, glucose 170, albumin 3.0.  AST is 505, ALT 262.  Alkaline phosphatase is 154 and total bilirubin is 2.0.   ASSESSMENT/PLAN:  This is an elderly male with severe periumbilical  pain, abdominal pain and chest pain, all of this was intermittent and  nondescript.  He is already ruled out for myocardial infarction and his  liver tests just came back with a pretty significant transaminitis as  described above.   PLAN:  1. Admit.  2. Telemetry.  3. Check spiral chest CT to rule out pulmonary embolism due to the      nature of the chest pain and due to the elevated D-dimer.  Will      also check abdominal and pelvic CT especially with the history of      gallbladder sludge and not eating at this current time as well as      the weight loss.  He may need a HIDA scan if felt to be      gallbladder.  4. History of swallowing evaluation in November of 2007, I do not have      the results.  5. Differential diagnosis includes pancreatitis, cholecystitis,      diverticulitis, ischemia versus some sort of underlying cancer. 6. Check a CA-19-9 and CEA to rule  out  some of these cancers.  7. Will put him on empiric fluids.  8. Will check blood and urine cultures.  9. Will check blood sugars and an A1c to try rule out hyperglycemia.  10.Will check a lactic acid to rule out ischemia.  11.Will consider assisted living, he certainly is not thriving.  He      may need a PEG tube.  12.Please see orders for further orders at this current time.  We will      only feed him clear fluids at this point.      Precious Reel, MD  Electronically Signed     JMR/MEDQ  D:  04/23/2006  T:  04/23/2006  Job:  [id number]

## 2010-07-11 NOTE — Discharge Summary (Signed)
NAMEKEJUAN, GEISLER NO.:  [id number]   MEDICAL RECORD NO.:  [id number]          PATIENT TYPE:  INP   LOCATION:  [place]                         FACILITY:  [facility]   PHYSICIAN:  Janalyn Rouse, M.D.  DATE OF BIRTH:  1940-06-16   DATE OF ADMISSION:  04/22/2006  DATE OF DISCHARGE:  04/25/2006                               DISCHARGE SUMMARY   DISCHARGE DIAGNOSES:  1. Abdominal pain secondary to cholelithiasis/passed stone.  2. Elevated liver function tests/transaminitis secondary to passed      stone.  3. Hypotension secondary to #1, resolved.  4. Weight loss.  5. Anxiety  6. Hypertension.  7. History of cerebrovascular accident.  8. Peripheral neuropathy.  9. Gastroesophageal reflux disease.  10.Benign prostatic hypertrophy.  11.History of alcohol use.   DISCHARGE MEDICATIONS:  1. Aspirin 81 mg daily.  2. Zoloft 50 mg daily.  3. Lotrel 1/2 prior dose.  4. Valium as needed.   HOSPITAL PROCEDURES:  1. CT of the chest, abdomen and pelvis (2/29) no pulmonary embolism.      Endobronchial filling defects within the right lower lobe bronchi      could represent mucous plugging.  Cannot exclude endobronchial      lesions.  Small amount of atelectasis in the right lower lobe.      Enhancement of the gallbladder wall with small amount of straining      near the extrahepatic bile duct without biliary ductal dilatation.      Left kidney cyst which appears to have enlarged in the interim and      hyperdense.  Ultrasound evaluation may be useful.  Few dilated      loops of small bowel without high-grade obstructive process.  CT of      the pelvis normal.  2. Abdominal ultrasound (02/29) gallbladder sludge with tiny stones in      the gallbladder neck region with gallbladder wall thickening.      Sonographic Murphy's sign negative but cholecystitis cannot be      excluded.  Left renal lesions which appear to be simple cysts,      however, the inferior pole cystic lesion  cannot be confirmed as a      simple cyst.  Follow up sonogram recommended to confirm stability.      Atherosclerotic changes in the aorta.  3. MRCP (02/29) pericholecystic fluid and mild perihepatic ascites.      Normal caliber without visible filling defect.  Pancreatic duct      appears normal.  Left frontal renal cysts some of which appear      complex and hemorrhagic.   HISTORY OF PRESENT ILLNESS:  For full details please see dictated  history and physical.  Briefly, Mr. Duty is a 70 year old white male  with a history of stroke, dysarthria and severe anxiety who presented to  the emergency department on 02/29 with complaint of periumbilical  abdominal pain.  The patient states that he had epigastric, substernal  and periumbilical abdominal pain that awoke him on the morning of  presentation around 3:00 a.m.  He took some Alka-Seltzer with resolution  but the pain presented intermittently throughout the rest of the day.  He did not have any fevers, chills or sweats.  No nausea or vomiting.  He noticed that his blood pressure was elevated.  Ultimately, his pain  worsened and he presented to the emergency department where he was found  to have elevated D-dimer and elevated liver function tests.  Due to the  elevated D-dimer the emergency department ordered a CT of the chest.  CT  of the abdomen and pelvis was added by Dr. Virgina Jock after reviewing his  liver functions.   HOSPITAL COURSE:  The patient was admitted to a medical bed.  To further  evaluate his significant elevation in bilirubin which was 2.0 and  transaminitis with an AST of 505 and ALT 262 with an alkaline  phosphatase of 154, a CT was performed.  In addition, extensive  laboratory evaluation with lactic acid, amylase, lipase, CA19-9, CEA,  urine drug screen, serum alcohol, and acetaminophen levels were  obtained.  These were all normal.  Acute hepatitis panel was also  obtained but is pending at that time of discharge.   CT was performed  with results as above concerning for acute cholecystitis.  The patient  was empirically started on Zosyn on admission due to his hypotension and  elevated LFTs in order to cover gallbladder source.  After reviewing the  CT and ultrasound that was performed and showed tiny stones in the  gallbladder neck with gallbladder wall thickening.  There was no  dilation of the common bile duct.  Given these findings, I discussed the  case with Dr. Hedy Camara with general surgery.  She recommended  gastroenterology consult.  The patient was seen by Dr. Lizbeth Bark who  recommended an MRCP.  This was performed and showed pericholecystic  fluid without any definite signs of acute cholecystitis.  With time and  hydration the patient's transaminitis resolved rapidly.  His hypotension  resolved.  He remained afebrile.  Dr. Watt Climes felt that his abdominal pain  does not correlate with cholecystitis and was most likely related to  passing a stone with a possible mild ileus, his abdominal pain did  resolve.  Given resolution of his LFTs and evaluation consistent with a  passed common bile duct stone, gastroenterology recommended outpatient  follow-up.  He will need repeat liver function tests as an outpatient  and will need to arrange outpatient follow-up with general surgery to  consider cholecystectomy.  At the time of discharge the patient was  evaluated by Dr. Reynaldo Minium on rounds and felt to be stable for discharge.  He will be discharged with close outpatient follow-up.   DISCHARGE LABS:  CBC shows a white count of 7.1, hemoglobin 11.7,  platelets 195.  CMP shows sodium 142, potassium 3.3, chloride 110,  bicarb 26, BUN 12, creatinine 1.5, glucose 101.  Total bilirubin is down  to 1.1, alkaline phosphatase 169, AST down to 65, and ALT down to 159.  Coag's remained normal and 1.1 on the day of discharge.  Blood cultures were negative.  Cardiac enzymes negative x3.  TSH 0.516.  Random  cortisol  6.8, which should be sufficient.  Acetaminophen level less than  10.  Amylase and normal.  Serum alcohol level 5.  CEA 1.4.  CA19-9 20.1  which is normal.  Lactic acid 1.1.  Blood ammonia level 15.  Urinalysis  0 to 2 white cells, otherwise normal.   DISCHARGE INSTRUCTIONS:  The patient was instructed to call back if he  has increased abdominal pain, nausea, vomiting or fever.   HOSPITAL FOLLOW-UP:  He will follow up with Dr. Brigitte Pulse in less than 1 week  for repeat liver function tests.  Outpatient follow-up will need to be  arranged with general surgery and/or GI.   DISPOSITION:  Home.      Janalyn Rouse, M.D.  Electronically Signed     WS/MEDQ  D:  04/30/2006  T:  04/30/2006  Job:  [id number]

## 2010-07-11 NOTE — Discharge Summary (Signed)
NAME:  [name], [name] NO.:  [id number]   MEDICAL RECORD NO.:  [id number]                   PATIENT TYPE:  INP   LOCATION:  5705                                 FACILITY:  [facility]   PHYSICIAN:  Lina Sar, M.D.                 DATE OF BIRTH:  1940/11/19   DATE OF ADMISSION:  03/08/2002  DATE OF DISCHARGE:  03/09/2002                                 DISCHARGE SUMMARY   DISCHARGE DIAGNOSES:  1. Headache.  2. Dizziness not otherwise specified.  3. History of cerebrovascular accident.  4. Chronic venous insufficiency.  5. Peripheral neuropathy.  6. Gastroesophageal reflux disease.  7. Benign prostatic hypertrophy.   DISCHARGE MEDICATIONS:  1. Aspirin 325 mg daily.  2. Lasix 40 mg daily.  3. Lotrel 10/20 mg daily.  4. Prevacid 30 mg daily.  5. Xanax 1 mg p.o. t.i.d. p.r.n.  6. Wellbutrin 150 mg p.o. b.i.d.   DISPOSITION:  Patient was discharged home in stable condition.   FOLLOW UP:  [facility] will call [name] with his appointment  time within the next 24 hours.   HISTORY AND PHYSICAL:  [name] is a 70 year old male with history of  alcoholism, multiple CVA's, hypertension and peripheral neuropathy who  presented to the emergency department complaining of feeling off balance x1  day.  Denied room spinning, felt more dizzy when he put his head down.  He  developed a headache that was frontal in location which resolved  spontaneously at its peak.  The discomfort was a 7/10 and pressure.  The  headache resolved spontaneously prior to admission and was then located at  the back at the nape of his neck and was more sore than headache pain.  Denied changes in vision, no dysphagia, no nausea/vomiting, no recent  illness.  Has been compliant with his warfarin anticoagulation until six  months ago.  Patient reports that he is once again drinking alcohol since  the death of his son six months ago.   Vital signs on examination  included temperature 97.1, blood pressure 156/79,  decreased breath sounds throughout, no crackle, wheeze or rhonchi.  There  were ecchymoses scattered on patient's arms bilaterally.  Neurologic  examination revealed a right facial droop that is spared the forehead.  Left  upper and lower extremities had 4/5 strength.  Right upper and lower  extremities had 5/5 strength which is consistent with sequelae from previous  CVA.  No nystagmus, negative Romberg, negative Hallpike, no Babinski.  Finger to nose test was normal.  Heel to shin test was normal.  Patient had  a broad based gait.   LABORATORY DATA:  Pertinent laboratory findings included a white count 11.8,  hemoglobin 13.9, platelets 334, sodium 135, potassium 3.7, chloride 104,  bicarbonate 28, BUN 8, creatinine 1.2, glucose 92, total bilirubin 0.9,  alkaline phosphatase 73, AST  24, ALT 21, total protein 6.4, albumin  3.2.  Urinalysis was negative.  Head CT showed no acute intracranial abnormality.  EKG was normal sinus rhythm at 66 beats per minute.  No ischemic changes  noted.   HOSPITAL COURSE:  1. Headache.  Patient's frontal headache resolved spontaneously and was not     associated with any new neurologic signs or symptoms aside from     dizziness.  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  Family     discounted this story however, patient describes soreness there and     tenderness to palpation.  There is no ecchymosis or abrasion or other     sign of injury to the area.  Head CT was negative.  It was felt that the     headache was benign in nature.  2. Dizziness.  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  This sensation had climaxed     the day of admission to the point where he even felt dizzy when he  bent     over.  Hallpike maneuver on hospital day #1 did recreate some of the     dizziness but not any nystagmus.  Patient's gait had normalized although     stance remained widened.  At the time of discharge cerebellar function     remained intact.  Patient is to continue antithrombotic therapy with full     dose aspirin daily.  Review of the literature showed very little benefit     of combined Plavix/aspirin therapy.  Patient was further risk stratified     with fasting lipid profiles which were re-assuring.  Total cholesterol     was 98, triglycerides 61, HDL 43, slightly low, LDL 43.  TSH was also     obtained, was 0.985, within normal limits.  At the time of discharge B12,     RPR and folate were pending.  3. Alcoholism.  Patient began drinking a bottle of wine very night after his     son's death six months ago.  He says that he has felt more instability     since resuming drinking.  He feels that he will be able to abstain from     alcohol on his own as he did when he quit previously.  Social work was     consulted to provide the patient with resources for AA and other support     groups.  Primary care physician was also alerted to this fact and will     provide support on an outpatient basis.  It is felt the patient likely     has some alcohol induced neuropathy as he does have an essential tremor,     intention tremor and a wide based gait.  Family discounted much of his     history that was given leaving health care team to question confabulation     or patient having difficulty at home which he wished to hide.  4. Hypertension.  Patient's blood pressure was inadequately controlled     during this hospitalization.  Patient seemed to be compliant with his     medication and should be addressed as an outpatient.  5. Gastroesophageal reflux disease.  Protonix was provided during this  hospitalization.  Prevacid to be continued as an outpatient. 6. Anxiety.  Patient reported  being on Bumex 1 mg p.o. t.i.d. p.r.n.     Suggest that patient either be weaned from anxiolytics or switched to a     new medication less associated with rebound and falls.  7. Depression.  Wellbutrin was continued during the hospitalization.                                               Lina Sar, M.D.    SS/MEDQ  D:  03/09/2002  T:  03/09/2002  Job:  [id number]

## 2010-07-11 NOTE — Consult Note (Signed)
NAME:  [name], [name] NO.:  [id number]   MEDICAL RECORD NO.:  [id number]          PATIENT TYPE:  INP   LOCATION:  1410                         FACILITY:  [facility]   PHYSICIAN:  Jeryl Columbia, M.D.    DATE OF BIRTH:  12-11-1940   DATE OF CONSULTATION:  04/23/2006  DATE OF DISCHARGE:                                 CONSULTATION   REFERRING PHYSICIAN:  Janalyn Rouse, M.D.   REASON FOR CONSULTATION:  The patient seen at the request of Dr. Brigitte Pulse  for abdominal pain and elevated liver test.  He does have gallstones on  ultrasound and CT, but no dilated ducts.  He has pain in left lower  quadrant and seems to come and go.  He has lost 90 pounds in the last 2  years.  He says it is due to a saliva problem and decreased taste, but  he can swallow.  He has occasional constipation.  He has had  colonoscopies and endoscopies in the past by Dr. Amedeo Plenty.  He has not had  any previous liver or gallbladder problems, but it sounds like he has  been a heavy drinker in the past, although he has not been drinking  recently.   PAST MEDICAL HISTORY:  Pertinent for anxiety, hypertension, two strokes,  peripheral neuropathy, history of reflux, BPH, colon polyps and a broken  ankle.   CURRENT MEDICATIONS:  Zoloft, Valium, Restoril, Lotrel, aspirin,  hydrocodone and Vicodin.   ALLERGIES:  NEURONTIN AND PAXIL.   FAMILY HISTORY:  Negative for any obvious GI problem or liver problem.   SOCIAL HISTORY:  Does smoke and does not drink.   REVIEW OF SYSTEMS:  Negative except for as above.  He does spit  frequently.   PHYSICAL EXAMINATION:  GENERAL:  In no acute distress, lying comfortable  in the bed.  VITAL SIGNS:  Stable and febrile.  LUNGS:  Clear.  HEART:  Regular rate and rhythm.  ABDOMEN:  Soft, nontender, good bowel sounds.   LABORATORY DATA AND X-RAY FINDINGS:  Ultrasound and CT reports reviewed.  See computer for details.   Labs pertinent for a white count that was 11.8,  dropped to 16.8,  hemoglobin dropped from 13 to 12, MCV normal, platelet count 195, BUN  21, creatinine 1.4.  Bilirubin 2.6 increased from 2, Alk phos increased  as well from 154 to 195, SGOT 530, SGPT 375, albumin 2.9.  Hepatitis  panel pending.  Amylase and lipase normal.  TSH normal.  CA-19.9 and CEA  normal.   ASSESSMENT:  1. Multiple medical problems.  2. Increased liver test, questionable causes.  3. Gallstones without dilated common bile duct.  4. Weight loss questionably due to decreased oral intake with his      saliva gland problem, per the patient.  5. Left lower quadrant, periodic abdominal pain mild, comes and goes,      doubt gallstones are biliary tract in origin.   RECOMMENDATIONS:  Since the pain does not sound like the gallbladder  bile ducts and since his ducts are not dilated, will go ahead and get an  MRCP first and hold off on an ERCP.  Will follow with you and await  acute hepatitis panel, although I doubt that is going to be positive.  Agree with following liver test as you are doing.  Consider any new  medicines that were started that could be playing a role with further  workup and plans pending findings on MRCP and following liver tests.  Consult surgery if signs of acute cholecystitis.           ______________________________  Jeryl Columbia, M.D.     MEM/MEDQ  D:  04/23/2006  T:  04/23/2006  Job:  [id number]   cc:   Janalyn Rouse, M.D.  Fax: [place] Amedeo Plenty, M.D.  Fax: [phone]

## 2010-07-11 NOTE — Op Note (Signed)
NAME:  [name], [name]              ACCOUNT NO.:  [id number]   MEDICAL RECORD NO.:  [id number]          PATIENT TYPE:  AMB   LOCATION:  ENDO                         FACILITY:  [facility]   PHYSICIAN:  John C. Amedeo Plenty, M.D.    DATE OF BIRTH:  [birth year]   DATE OF PROCEDURE:  01/04/2004  DATE OF DISCHARGE:                                 OPERATIVE REPORT   PROCEDURE:  Esophagogastroduodenoscopy.   INDICATION FOR PROCEDURE:  The patient with difficult to sort out complaints  of sticking feeling in his throat, need to expectorate, and feeling of  drainage in his throat with __________.  This could possibly represent  gastroesophageal reflux.  He is on a proton pump inhibitor which has not  prevented or really improved his symptoms.   DESCRIPTION OF PROCEDURE:  The patient was placed in the left lateral  decubitus position and placed on the pulse monitor with continuous low-flow  oxygen delivered by nasal cannula.  He was sedated with 62.5 mcg IV fentanyl  and 6 mg IV Versed.  The Olympus video endoscope was advanced under direct  vision into the oropharynx and esophagus.  Inspection in the oropharynx  revealed no obvious abnormalities.  The vocal cords, arytenoid cartilages,  and folliculi showed no obvious abnormality.  The esophagus was intubated  and basically appeared normal throughout its length.  The squamocolumnar  line was at 40 cm.  There was no visible hiatal hernia, ring, stricture, or  other abnormality of the GE junction.  The stomach was entered, and a small  amount of liquid secretions were suctioned from the fundus.  Retroflexed  view of the cardia was unremarkable.  The fundus, body, antrum, and pylorus  all appeared normal.  The duodenum was entered, and both the bulb and second  portion were well-inspected and appeared to be within normal limits.  The  scope was then withdrawn, and the patient returned to the recovery room in  stable condition.  He tolerated the procedure  well, and there were no  immediate complications.   IMPRESSION:  Normal endoscopy.   PLAN:  I do not think he likely has gastroesophageal reflux as the cause of  his symptoms.  Refer back to Dr. Anitra Lauth for further consideration of  possible sinusitis, symptoms related to prior cerebrovascular accident, etc.  Would continue proton pump inhibitor pending further work-up.      JCH/MEDQ  D:  01/04/2004  T:  01/04/2004  Job:  [id number]   cc:   Tammi Sou, MD  [place]  [place] [address]  Fax: [phone]

## 2010-07-11 NOTE — Procedures (Signed)
[facility]  Patient:    [name], [name] Visit Number: [id number] MRN: [id number]          Service Type: END Location: ENDO Attending Physician:  Rafael Bihari Dictated by:   Elyse Jarvis Amedeo Plenty, M.D. Proc. Date: 07/13/01 Admit Date:  07/13/2001   CC:         Kelle Darting, M.D.   Procedure Report  PROCEDURE:  Colonoscopy with polypectomy.  INDICATION FOR PROCEDURE:  Heme positive stool.  DESCRIPTION OF PROCEDURE:  The patient was placed in the left lateral decubitus position then placed on the pulse monitor with continuous low flow oxygen delivered by nasal cannula. He was sedated with 60 mg IV Demerol and 5 mg IV Versed. The Olympus video colonoscope was inserted into the rectum and advanced to the cecum, confirmed by transillumination at McBurneys point and visualization of the ileocecal valve and appendiceal orifice. The prep was good. The cecum, ascending, and proximal transverse colon appeared normal with no masses, polyps, diverticula or other mucosal abnormalities. In the mid transverse colon, there was seen a 1 cm polyp which was removed by a combination of snare and hot biopsy. The remainder of the transverse, descending, and sigmoid colon appeared normal with no further polyps, massed, diverticula or other mucosal abnormalities. The rectum likewise appeared normal and a retroflexed view of the anus revealed small internal hemorrhoids. The colonoscope was then withdrawn and the patient returned to the recovery room in stable condition. The patient tolerated the procedure well and there were no immediate complications.  IMPRESSION:  1. Transverse colon polyp.  2. Internal hemorrhoids.  PLAN:  Await histology for determination of interval for next colonoscopy. Dictated by:   Elyse Jarvis Amedeo Plenty, M.D. Attending Physician:  Rafael Bihari DD:  07/13/01 TD:  07/14/01 Job: [id number]

## 2010-07-11 NOTE — Op Note (Signed)
NAME:  [name], MALEKI NO.:  [id number]   MEDICAL RECORD NO.:  [id number]          PATIENT TYPE:  OIB   LOCATION:  79                         FACILITY:  [facility]   PHYSICIAN:  Fenton Malling. Lucia Gaskins, M.D.  DATE OF BIRTH:  Jan 30, 1941   DATE OF PROCEDURE:  06/14/2006  DATE OF DISCHARGE:                               OPERATIVE REPORT   PREOPERATIVE DIAGNOSIS:  Chronic cholecystitis and cholelithiasis.   POSTOPERATIVE DIAGNOSIS:  Chronic cholecystitis and cholelithiasis.   OPERATION PERFORMED:  Laparoscopic cholecystectomy with intraoperative  cholangiogram.   SURGEON:  Fenton Malling. Lucia Gaskins, M.D.   ASSISTANT:  Jonne Ply, MD   ANESTHESIA:  General endotracheal.   ESTIMATED BLOOD LOSS:  Minimal.   INDICATIONS FOR PROCEDURE:  Mr. Reffner is a pleasant 70 year old white  male patient of Dr. Lang Snow who was hospitalized at [facility] from February 20 to April 25, 2006 for abdominal pain  felt to be secondary to possible common bile duct stone.   He was seen by Dr. Watt Climes in consultation.  MRCP showed a small common  bile duct with no stones.  He was discharged home and then returned for  evaluation of possible cholecystectomy.   The indications and potential complications of gallbladder were  explained to the patient, potential complications include but not  limited to bleeding, infection, bile duct injury and possibility of open  surgery.   DESCRIPTION OF PROCEDURE:  Patient placed in supine position, given  general endotracheal anesthetic.  We called a time out identifying the  patient and procedure.  He was given 1 g Ancef at initiation of  procedure.   His abdomen was prepped with Betadine solution and sterilely draped.  An  infraumbilical incision was made with sharp dissection and carried down  into the abdominal cavity.  A 0 degree 10 mm laparoscope was inserted  through a 12 mm Hasson trocar  and the Hasson trocar secured with 0  Vicryl suture.  Abdominal exploration revealed the right and left lobes  were normal.  However, the liver was sort of rotated a little to the  right side.  The stomach was normal.  The bowel that I could see was  otherwise unremarkable.  Three additional trocars were placed.   The three trocars placed.  There was a 10 mm subxiphoid trocar, a 5 mm  right midsubcostal and a 5 mm lateral subcostal trocar.  The gallbladder  was then grasped.  It was noted to be encased sort of in omentum  consistent with probably chronic gallbladder disease.  This was teased  down taken with both sharp scissors and blunt dissection.   I got down to the cystic duct gallbladder junction.  The patient  actually had a fairly long cystic duct, probably 3 to 4 cm in length, I  did shoot an intraoperative cholangiogram.   Intraoperative cholangiogram was shot using a cut off taut catheter  inserted through a 14 gauge Jelco into the side of the cystic duct.  This was then secured with an Endoclip, half strength Hypaque solution  was used approximately 20 mL  which showed free flow of contrast down the  cystic duct into the common bile duct up the hepatic radicals and down  into the duodenum.  There was no filling defect, no mass.  This was felt  to be a normal intraoperative cholangiogram.   I felt that I had adequate length of cystic duct.  The taut catheter was  removed.  The cystic duct triply endoclipped an divided.  The  gallbladder then sharply and bluntly dissected from the gallbladder bed.  Prior to complete division of the gallbladder from the gallbladder bed,  I revisualized the triangle of Calot.  I revisualized the gallbladder  bed.  There was no bleeding, no bile leak.  The gallbladder was then  divided, placed in an Endocatch bag and delivered through the umbilicus.   I irrigated the abdomen with about a liter of saline.  I reinspected  both again the liver bed and the omentum that I teased off the   gallbladder.  I thought I was well away from the duodenum during the  dissection.   The trocars were then removed in turn.  The umbilical port was closed  with 0 Vicryl suture.  The skin each port was closed with 5-0 Vicryl  suture, painted with tincture of benzoin and steri-stripped.  The  patient tolerated the procedure well, was transported to recovery room  in good condition.  Sponge and needle counts were correct at the end of  this case.      Fenton Malling. Lucia Gaskins, M.D.  Electronically Signed     DHN/MEDQ  D:  06/14/2006  T:  06/14/2006  Job:  [id number]   cc:   Janalyn Rouse, M.D.  Fax: [phone]   Jeryl Columbia, M.D.  Fax: [phone]

## 2010-08-25 ENCOUNTER — Other Ambulatory Visit: Payer: Self-pay | Admitting: Cardiovascular Disease

## 2010-08-26 ENCOUNTER — Other Ambulatory Visit: Payer: Self-pay | Admitting: *Deleted

## 2010-08-26 MED ORDER — CLOPIDOGREL BISULFATE 75 MG PO TABS: 75.0000 mg | ORAL_TABLET | Freq: Every day | ORAL | Status: DC

## 2010-09-05 ENCOUNTER — Encounter: Payer: Self-pay | Admitting: Cardiovascular Disease

## 2010-10-03 ENCOUNTER — Other Ambulatory Visit: Payer: Self-pay | Admitting: Gastroenterology

## 2010-10-07 ENCOUNTER — Encounter: Payer: Self-pay | Admitting: Cardiovascular Disease

## 2010-10-07 ENCOUNTER — Ambulatory Visit (INDEPENDENT_AMBULATORY_CARE_PROVIDER_SITE_OTHER): Payer: Medicare Other | Admitting: Cardiovascular Disease

## 2010-10-07 DIAGNOSIS — I10 Essential (primary) hypertension: Secondary | ICD-10-CM

## 2010-10-07 DIAGNOSIS — I2510 Atherosclerotic heart disease of native coronary artery without angina pectoris: Secondary | ICD-10-CM

## 2010-10-07 MED ORDER — ISOSORBIDE MONONITRATE ER 30 MG PO TB24: 30.0000 mg | ORAL_TABLET | Freq: Every day | ORAL | Status: DC

## 2010-10-07 NOTE — Progress Notes (Signed)
HPI:  This is a 70 year old gentleman presenting for followup evaluation. The patient has a history of coronary artery disease and previous stenting of the right coronary artery.  He initially presented in 2010 with an inferior infarct. The patient underwent relook catheterization in 2011 which demonstrated mild to moderate in stenosis near the stent in the right coronary artery there was no high-grade obstructive disease. Medical therapy was recommended. He had a followup nuclear stress test in February 2012 showing prior inferior infarct with mild. Infarct ischemia. The patient has continued with medical treatment.  He reports an episode of palpitations when he awoke approximately one week ago. The patient had associated chest pain with this. He took 2 nitroglycerin and his pain resolved. The pain was left-sided and felt like a pressure. It was nonradiating. He is taking one sublingual nitroglycerin about once per week on average. He denies exertional chest pain or tightness. However, the patient is extremely sedentary. He tells me that he stays in his house almost all day every day. He is a heavy smoker and really does not have interest in quitting. He has chronic dyspnea. He denies edema, lightheadedness, or syncope.  Outpatient Encounter Prescriptions as of 10/07/2010  Medication Sig Dispense Refill  . aspirin 81 MG tablet Take 81 mg by mouth daily.        . clopidogrel (PLAVIX) 75 MG tablet Take 1 tablet (75 mg total) by mouth daily.  30 tablet  7  . diazepam (VALIUM) 5 MG tablet Take 7.5 mg by mouth daily.        Marland Kitchen HYDROcodone-acetaminophen (NORCO) 5-325 MG per tablet Take 1 tablet by mouth every 6 (six) hours as needed.        Marland Kitchen lisinopril (PRINIVIL,ZESTRIL) 20 MG tablet Take 20 mg by mouth daily.        . nitroGLYCERIN (NITROSTAT) 0.4 MG SL tablet Place 0.4 mg under the tongue every 5 (five) minutes as needed.        Marland Kitchen omeprazole (PRILOSEC) 20 MG capsule Take 20 mg by mouth once a week.        .  risperiDONE (RISPERDAL) 0.5 MG tablet Take 0.5 mg by mouth daily.        . sertraline (ZOLOFT) 100 MG tablet Take 100 mg by mouth daily.        . simvastatin (ZOCOR) 40 MG tablet TAKE 1 TABLET BY MOUTH AT BEDTIME  30 tablet  5  . DISCONTD: Calcium Carbonate-Vitamin D 600-400 MG-UNIT per tablet Take 1 tablet by mouth daily.        Marland Kitchen DISCONTD: cholecalciferol (VITAMIN D) 1000 UNITS tablet Take 1,000 Units by mouth daily.        Marland Kitchen DISCONTD: lisinopril (PRINIVIL,ZESTRIL) 10 MG tablet Take 10 mg by mouth daily.          Allergies  Allergen Reactions  . Gabapentin   . Paroxetine     Past Medical History  Diagnosis Date  . 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 Hypertension   . Bradycardia   . Dyslipidemia   . Tobacco abuse   . CVA (cerebral vascular accident)   . History of benign prostatic hypertrophy   . GERD (gastroesophageal reflux disease)   . Anxiety   . History of leukocytosis   . Neuropathy     ROS: Negative except as per HPI  BP 132/68  Pulse 63  Ht 5' 10.5" (1.791 m)  Wt 214 lb  6.4 oz (97.251 kg)  BMI 30.33 kg/m2  PHYSICAL EXAM: Pt is alert and oriented, NAD. The patient smells strongly of cigarette smoke. HEENT: normal Neck: JVP - normal, carotids 2+= with soft bruits Lungs: CTA bilaterally CV: RRR without murmur or gallop Abd: soft, NT, Positive BS, no hepatomegaly Ext: no C/C/E, distal pulses intact and equal Skin: warm/dry no rash  EKG:  Normal sinus rhythm 63 beats per minute, within normal limits.  ASSESSMENT AND PLAN:

## 2010-10-07 NOTE — Assessment & Plan Note (Signed)
Blood pressure is controlled on lisinopril. Isosorbide is being added.

## 2010-10-07 NOTE — Patient Instructions (Signed)
Your physician wants you to follow-up in: 6 months. You will receive a reminder letter in the mail two months in advance. If you don't receive a letter, please call our office to schedule the follow-up appointment.  Your physician has recommended you make the following change in your medication: Start Imdur 30 mg by mouth daily.

## 2010-10-07 NOTE — Assessment & Plan Note (Signed)
The patient is stable with mild episodic angina. I think he would benefit from the addition of a long-acting nitrate. I have added isosorbide mononitrate 30 mg daily. I talked to him about starting a beta blocker but he has not been able to tolerate this in the past due to profound fatigue with even low doses of metoprolol. He will continue on dual antiplatelet therapy with aspirin and Plavix. If he has increasing severity or frequency of angina will consider repeat catheterization. How long discussion with the patient regarding tobacco cessation but he tells me that "I would go crazy sitting in my house without being able to smoke." I tried to encourage him to get out and do more but I don't think this is going to happen.

## 2010-12-06 ENCOUNTER — Emergency Department (HOSPITAL_BASED_OUTPATIENT_CLINIC_OR_DEPARTMENT_OTHER)
Admission: EM | Admit: 2010-12-06 | Discharge: 2010-12-06 | Disposition: A | Discharge status: Home / Self Care | Payer: Medicare Other | Attending: Emergency Medicine | Admitting: Emergency Medicine

## 2010-12-06 ENCOUNTER — Encounter (HOSPITAL_BASED_OUTPATIENT_CLINIC_OR_DEPARTMENT_OTHER): Payer: Self-pay | Admitting: *Deleted

## 2010-12-06 ENCOUNTER — Other Ambulatory Visit: Payer: Self-pay

## 2010-12-06 ENCOUNTER — Emergency Department (INDEPENDENT_AMBULATORY_CARE_PROVIDER_SITE_OTHER): Payer: Medicare Other

## 2010-12-06 DIAGNOSIS — F17200 Nicotine dependence, unspecified, uncomplicated: Secondary | ICD-10-CM

## 2010-12-06 DIAGNOSIS — I2510 Atherosclerotic heart disease of native coronary artery without angina pectoris: Secondary | ICD-10-CM | POA: Insufficient documentation

## 2010-12-06 DIAGNOSIS — I129 Hypertensive chronic kidney disease with stage 1 through stage 4 chronic kidney disease, or unspecified chronic kidney disease: Secondary | ICD-10-CM | POA: Insufficient documentation

## 2010-12-06 DIAGNOSIS — Z79899 Other long term (current) drug therapy: Secondary | ICD-10-CM | POA: Insufficient documentation

## 2010-12-06 DIAGNOSIS — N183 Chronic kidney disease, stage 3 unspecified: Secondary | ICD-10-CM | POA: Insufficient documentation

## 2010-12-06 DIAGNOSIS — I252 Old myocardial infarction: Secondary | ICD-10-CM | POA: Insufficient documentation

## 2010-12-06 DIAGNOSIS — R079 Chest pain, unspecified: Secondary | ICD-10-CM | POA: Insufficient documentation

## 2010-12-06 DIAGNOSIS — J449 Chronic obstructive pulmonary disease, unspecified: Secondary | ICD-10-CM

## 2010-12-06 DIAGNOSIS — E7800 Pure hypercholesterolemia, unspecified: Secondary | ICD-10-CM | POA: Insufficient documentation

## 2010-12-06 DIAGNOSIS — I10 Essential (primary) hypertension: Secondary | ICD-10-CM

## 2010-12-06 DIAGNOSIS — Z8679 Personal history of other diseases of the circulatory system: Secondary | ICD-10-CM | POA: Insufficient documentation

## 2010-12-06 HISTORY — DX: Pain in unspecified foot: M79.673

## 2010-12-06 HISTORY — DX: Chronic kidney disease, unspecified: N18.9

## 2010-12-06 HISTORY — DX: Peripheral vascular disease, unspecified: I73.9

## 2010-12-06 HISTORY — DX: Chronic obstructive pulmonary disease, unspecified: J44.9

## 2010-12-06 HISTORY — DX: Old myocardial infarction: I25.2

## 2010-12-06 LAB — CBC
HCT: 41.6 % (ref 39.0–52.0)
Hemoglobin: 14.2 g/dL (ref 13.0–17.0)
MCH: 29 pg (ref 26.0–34.0)
MCHC: 34.1 g/dL (ref 30.0–36.0)
MCV: 85.1 fL (ref 78.0–100.0)
Platelets: 182 10*3/uL (ref 150–400)
RBC: 4.89 MIL/uL (ref 4.22–5.81)
RDW: 14.8 % (ref 11.5–15.5)
WBC: 8.8 10*3/uL (ref 4.0–10.5)

## 2010-12-06 LAB — BASIC METABOLIC PANEL
BUN: 11 mg/dL (ref 6–23)
CO2: 26 mEq/L (ref 19–32)
Calcium: 9.6 mg/dL (ref 8.4–10.5)
Chloride: 105 mEq/L (ref 96–112)
Creatinine, Ser: 1.5 mg/dL — ABNORMAL HIGH (ref 0.50–1.35)
GFR calc Af Amer: 52 mL/min — ABNORMAL LOW (ref >90)
GFR calc non Af Amer: 45 mL/min — ABNORMAL LOW (ref >90)
Glucose, Bld: 79 mg/dL (ref 70–99)
Potassium: 4 mEq/L (ref 3.5–5.1)
Sodium: 140 mEq/L (ref 135–145)

## 2010-12-06 LAB — TROPONIN I: Troponin I: <0.3 ng/mL (ref <0.30)

## 2010-12-06 MED ORDER — ASPIRIN 81 MG PO TABS
243.0000 mg | ORAL_TABLET | Freq: Once | ORAL | Status: AC
  Administered 2010-12-06: 243 mg via ORAL

## 2010-12-06 MED ORDER — ASPIRIN 81 MG PO CHEW
CHEWABLE_TABLET | ORAL | Status: AC
  Filled 2010-12-06: qty 4

## 2010-12-06 NOTE — ED Notes (Signed)
Patient states he has had chest pain since his last heart attack in 2110.  States today he had chest pain and had to take  2 NTG.  States his neighbor called and he told her he had chest pain, and she insisted he come to the hospital.

## 2010-12-06 NOTE — ED Provider Notes (Signed)
History     CSN: [id number] Arrival date & time: 12/06/2010  3:23 PM  Chief Complaint  Patient presents with  . Chest Pain    (Consider location/radiation/quality/duration/timing/severity/associated sxs/prior treatment) HPI  Past Medical History  Diagnosis Date  . 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 Hypertension   . Bradycardia   . Dyslipidemia   . Tobacco abuse   . CVA (cerebral vascular accident)   . History of benign prostatic hypertrophy   . GERD (gastroesophageal reflux disease)   . Anxiety   . History of leukocytosis   . Neuropathy   . COPD (chronic obstructive pulmonary disease)   . Peripheral vascular disease   . 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 Neuropathy   . MI, old   . Foot pain   . Anxiety     Past Surgical History  Procedure Date  . Cholecystectomy   . Intervention of the rca     using a single BMS  . Ankle surgery     Family History  Problem Relation Age of Onset  . Stroke Mother   . Heart attack Brother   . Heart disease Brother     History  Substance Use Topics  . Smoking status: Current Everyday Smoker -- 1.0 packs/day    Types: Cigarettes  . Smokeless tobacco: Former Systems developer  . Alcohol Use: No      Review of Systems  Allergies  Amitriptyline; Gabapentin; and Paroxetine  Home Medications   Current Outpatient Rx  Name Route Sig Dispense Refill  . ASPIRIN 81 MG PO TABS Oral Take 81 mg by mouth daily.      Marland Kitchen CLOPIDOGREL BISULFATE 75 MG PO TABS Oral Take 1 tablet (75 mg total) by mouth daily. 30 tablet 7  . DIAZEPAM 5 MG PO TABS Oral Take 7.5 mg by mouth daily.      Marland Kitchen HYDROCODONE-ACETAMINOPHEN 5-325 MG PO TABS Oral Take 1 tablet by mouth every 6 (six) hours as needed.      . ISOSORBIDE MONONITRATE ER 30 MG PO TB24 Oral Take 1 tablet (30 mg total) by mouth daily. 30 tablet 11  . LISINOPRIL 20 MG PO TABS Oral Take 20 mg by mouth daily.      Marland Kitchen NITROGLYCERIN 0.4 MG SL SUBL  Sublingual Place 0.4 mg under the tongue every 5 (five) minutes as needed.      Marland Kitchen OMEPRAZOLE 20 MG PO CPDR Oral Take 20 mg by mouth once a week.      Marland Kitchen RISPERIDONE 0.5 MG PO TABS Oral Take 0.5 mg by mouth daily.      . SERTRALINE HCL 100 MG PO TABS Oral Take 100 mg by mouth daily.      Marland Kitchen SIMVASTATIN 40 MG PO TABS  TAKE 1 TABLET BY MOUTH AT BEDTIME 30 tablet 5    BP 145/59  Pulse 76  Temp(Src) 97.8 F (36.6 C) (Oral)  Resp 16  Ht 5\' 11"  (1.803 m)  Wt 208 lb (94.348 kg)  BMI 29.01 kg/m2  SpO2 95%  Physical Exam  ED Course  Procedures (including critical care time)   Labs Reviewed  CBC  BASIC METABOLIC PANEL  TROPONIN I   No results found.   No diagnosis found.   Date: 12/06/2010  Rate: 71  Rhythm: normal sinus rhythm  QRS Axis: normal  Intervals: normal  ST/T Wave abnormalities: normal  Conduction Disutrbances:none  Narrative Interpretation: One  PAC on ECG  Old EKG Reviewed: unchanged from 03/26/10    MDM  Please see completed note for today's visit.          Lezlie Octave, MD 12/06/10 [phone]

## 2010-12-06 NOTE — ED Provider Notes (Signed)
History    Scribed for Lezlie Octave, MD, the patient was seen in room MHT14/MHT14. This chart was scribed by Lyndee Hensen. This patient's care was started at 3:26 PM.    CSN: [id number] Arrival date & time: 12/06/2010  3:23 PM  Chief Complaint  Patient presents with  . Chest Pain    (Consider location/radiation/quality/duration/timing/severity/associated sxs/prior treatment) HPI  [name] is a 70 y.o. male who presents to the Emergency Department complaining of sudden onset of chest pain this AM.  Chest pain has resolved.  Denies current chest pain and SOB. Patient states he was sitting at the table drinking coffee and sudden onset of chest pain, SOB, and diaphoresis.  Patient took 2 NTG and pain went away.  Last NTG use was a month ago. Patient took baby ASA today.   Patient reports similar sx previously.  Per daughter patient has been coughing and complains of back pain.  Patient has hx of HTN, hypercholesterolemia, CAD, CVA, MI (2009 and 2010).  Patient last had stress test a couple years ago.  Denies daily chest pain.  Patient is a smoker and is not compliant with inhaler use.     Cardiologist Dr. Burt Knack and PCP Dr. Brigitte Pulse at [place]:  Past Medical History  Diagnosis Date  . 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 Hypertension   . Bradycardia   . Dyslipidemia   . Tobacco abuse   . CVA (cerebral vascular accident)   . History of benign prostatic hypertrophy   . GERD (gastroesophageal reflux disease)   . Anxiety   . History of leukocytosis   . Neuropathy   . COPD (chronic obstructive pulmonary disease)   . Peripheral vascular disease   . 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 Neuropathy   . MI, old   . Foot pain   . Anxiety     PAST SURGICAL HISTORY:  Past Surgical History  Procedure Date  . Cholecystectomy   . Intervention of the rca     using a single BMS  . Ankle surgery      FAMILY HISTORY:  Family History  Problem Relation Age of Onset  . Stroke Mother   . Heart attack Brother   . Heart disease Brother      SOCIAL HISTORY: History   Social History  . Marital Status: Single    Spouse Name: N/A    Number of Children: N/A  . Years of Education: N/A   Social History Main Topics  . Smoking status: Current Everyday Smoker -- 1.0 packs/day    Types: Cigarettes  . Smokeless tobacco: Former Systems developer  . Alcohol Use: No  . Drug Use: No  . Sexually Active: None   Other Topics Concern  . None   Social History Narrative  . None    Review of Systems 10 Systems reviewed and are negative for acute change except as noted in the HPI.  Allergies  Amitriptyline; Gabapentin; and Paroxetine  Home Medications   Current Outpatient Rx  Name Route Sig Dispense Refill  . ASPIRIN 81 MG PO TABS Oral Take 81 mg by mouth daily.      Marland Kitchen CLOPIDOGREL BISULFATE 75 MG PO TABS Oral Take 1 tablet (75 mg total) by mouth daily. 30 tablet 7  . DIAZEPAM 5 MG PO TABS Oral Take 7.5 mg by mouth daily.      Marland Kitchen  HYDROCODONE-ACETAMINOPHEN 5-325 MG PO TABS Oral Take 1 tablet by mouth every 6 (six) hours as needed. For pain    . ISOSORBIDE MONONITRATE ER 30 MG PO TB24 Oral Take 1 tablet (30 mg total) by mouth daily. 30 tablet 11  . LISINOPRIL 20 MG PO TABS Oral Take 20 mg by mouth daily.      Marland Kitchen NITROGLYCERIN 0.4 MG SL SUBL Sublingual Place 0.4 mg under the tongue every 5 (five) minutes as needed. For chest pain    . OMEPRAZOLE 20 MG PO CPDR Oral Take 20 mg by mouth once a week.      Marland Kitchen RISPERIDONE 0.5 MG PO TABS Oral Take 0.5 mg by mouth daily.      . SERTRALINE HCL 100 MG PO TABS Oral Take 100 mg by mouth daily.      Marland Kitchen SIMVASTATIN 40 MG PO TABS  TAKE 1 TABLET BY MOUTH AT BEDTIME 30 tablet 5    BP 136/60  Pulse 72  Temp(Src) 97.8 F (36.6 C) (Oral)  Resp 18  Ht 5\' 11"  (1.803 m)  Wt 208 lb (94.348 kg)  BMI 29.01 kg/m2  SpO2 96%  Physical Exam  Constitutional: He is  oriented to person, place, and time. He appears well-developed and well-nourished.  Non-toxic appearance. He does not have a sickly appearance. No distress.  HENT:  Head: Normocephalic and atraumatic.  Eyes: Conjunctivae, EOM and lids are normal. Pupils are equal, round, and reactive to light.  Neck: Trachea normal, normal range of motion and full passive range of motion without pain. Neck supple.  Cardiovascular: Normal rate, regular rhythm and normal heart sounds.  Exam reveals no gallop.   No murmur heard. Pulmonary/Chest: Effort normal and breath sounds normal. No respiratory distress. He has no wheezes.       Scattered rhonchi   Abdominal: Soft. Normal appearance. He exhibits no distension. There is no tenderness. There is no rebound and no CVA tenderness.  Musculoskeletal: Normal range of motion. He exhibits edema. He exhibits no tenderness.       1+ bilateral ankle edema   Neurological: He is alert and oriented to person, place, and time. He has normal strength.  Skin: Skin is warm, dry and intact. No rash noted.  Psychiatric: He has a normal mood and affect. His behavior is normal.    ED Course  Procedures (including critical care time)   OTHER DATA REVIEWED: Nursing notes, vital signs, and past medical records reviewed.    DIAGNOSTIC STUDIES: Oxygen Saturation is 95% on room air, normal by my interpretation.    EKG:  Date: 12/06/2010  Rate: 71  Rhythm: normal sinus rhythm  QRS Axis: normal  Intervals: normal  ST/T Wave abnormalities: normal  Conduction Disutrbances:none  Narrative Interpretation: One PAC on ECG  Old EKG Reviewed: unchanged from 03/26/10   LABS / RADIOLOGY:   Labs Reviewed  BASIC METABOLIC PANEL - Abnormal; Notable for the following:    Creatinine, Ser 1.50 (*)    GFR calc non Af Amer 45 (*)    GFR calc Af Amer 52 (*)    All other components within normal limits  CBC  TROPONIN I   Results for orders placed during the hospital encounter of  12/06/10  CBC      Component Value Range   WBC 8.8  4.0 - 10.5 (K/uL)   RBC 4.89  4.22 - 5.81 (MIL/uL)   Hemoglobin 14.2  13.0 - 17.0 (g/dL)   HCT 41.6  39.0 - 52.0 (%)  MCV 85.1  78.0 - 100.0 (fL)   MCH 29.0  26.0 - 34.0 (pg)   MCHC 34.1  30.0 - 36.0 (g/dL)   RDW 14.8  11.5 - 15.5 (%)   Platelets 182  150 - 400 (K/uL)  BASIC METABOLIC PANEL      Component Value Range   Sodium 140  135 - 145 (mEq/L)   Potassium 4.0  3.5 - 5.1 (mEq/L)   Chloride 105  96 - 112 (mEq/L)   CO2 26  19 - 32 (mEq/L)   Glucose, Bld 79  70 - 99 (mg/dL)   BUN 11  6 - 23 (mg/dL)   Creatinine, Ser 1.50 (*) 0.50 - 1.35 (mg/dL)   Calcium 9.6  8.4 - 10.5 (mg/dL)   GFR calc non Af Amer 45 (*) >90 (mL/min)   GFR calc Af Amer 52 (*) >90 (mL/min)  TROPONIN I      Component Value Range   Troponin I <0.30  <0.30 (ng/mL)        ED COURSE / COORDINATION OF CARE: 3:32 PM  Physical exam complete.  Will order EKG and ASA. Consult cardiologist on call for Dr. Burt Knack and review past medical records.   5:41 PM  Consult with cardiologist regarding patient course of treatment.  If patient is ambulatory  without chest pain will discharge patient home.   Patient will likely follow up with cardiologist for stress test in 2 days.     Orders Placed This Encounter  Procedures  . DG Chest 2 View  . CBC  . Basic metabolic panel  . Troponin I  . Consult to cardiology  . EKG test  . Saline lock IV      MEDICATIONS GIVEN IN THE E.D. Scheduled Meds:    . aspirin      . aspirin  243 mg Oral Once   Continuous Infusions:     Date: 12/06/2010  Rate: 71  Rhythm: normal sinus rhythm  QRS Axis: normal  Intervals: normal  ST/T Wave abnormalities: normal  Conduction Disutrbances:none  Narrative Interpretation: PAC noted  Old EKG Reviewed: unchanged from 03/26/2010    MDM  MDM: Patient was discussed with Dr. Wonda Cerise from cardiology is covering for [facility] cardiology today and we both agree this is unlikely to  be unstable angina. Patient had chest pain at rest and not with exertion. He feels well at this time and has not had any chest pain since earlier when he took his nitroglycerin at home. Had it not been for a neighbor's prompting the patient would not have even come into the ED at all for his pain. Patient has been able to ambulate around the emergency department here briskly and without any shortness of breath or chest pain. Patient has negative cardiac markers after his pain started over 6 hours ago this morning. He also has no changes on his EKG. At this point in time the plan is for patient to follow up on Monday with cardiology for likely stress test early this week. Patient is to return for any increasing or worsening chest pain or difficulty breathing and he understands this at time of discharge.      IMPRESSION: No diagnosis found.     I personally performed the services described in this documentation, which was scribed in my presence. The recorded information has been reviewed and considered.          Lezlie Octave, MD 12/06/10 [phone]

## 2010-12-06 NOTE — ED Notes (Signed)
Care plan and follow up reviewed will return for any chest pain

## 2010-12-08 ENCOUNTER — Telehealth: Payer: Self-pay | Admitting: Cardiovascular Disease

## 2010-12-08 NOTE — Telephone Encounter (Signed)
Pt needs office visit this week to follow-up on ER visit 12/06/10. I spoke with the pt and scheduled him to see Dr Burt Knack on 12/09/10 at 10:00.

## 2010-12-08 NOTE — Telephone Encounter (Signed)
Pt returning call to someone from our office. Please call pt back.

## 2010-12-08 NOTE — Telephone Encounter (Signed)
Pt was seen as out patient in the new center on [facility].  They were told he needed to have stress test.  There is no order in or no info on night call list.  Please check and advise.

## 2010-12-09 ENCOUNTER — Encounter: Payer: Self-pay | Admitting: Cardiovascular Disease

## 2010-12-09 ENCOUNTER — Ambulatory Visit (INDEPENDENT_AMBULATORY_CARE_PROVIDER_SITE_OTHER): Payer: Medicare Other | Admitting: Cardiovascular Disease

## 2010-12-09 DIAGNOSIS — I2510 Atherosclerotic heart disease of native coronary artery without angina pectoris: Secondary | ICD-10-CM

## 2010-12-09 DIAGNOSIS — R079 Chest pain, unspecified: Secondary | ICD-10-CM

## 2010-12-09 DIAGNOSIS — F17200 Nicotine dependence, unspecified, uncomplicated: Secondary | ICD-10-CM

## 2010-12-09 DIAGNOSIS — I10 Essential (primary) hypertension: Secondary | ICD-10-CM

## 2010-12-09 NOTE — Progress Notes (Signed)
HPI:  [name] is a 70 year old gentleman presenting for emergency department followup. He was evaluated a few days ago for chest pain. The patient has known coronary artery disease and has undergone stenting of the right coronary artery. His last Myoview stress scan was earlier this year and it demonstrated inferior infarct with mild peri-infarct ischemia. It was felt to be low risk and he was managed medically.  He recently developed chest pain after drinking coffee and [facility] in the morning. He took Prilosec and a nitroglycerin and his symptoms resolved. He felt like his symptoms were probably due to heartburn, but he tells me that his friend overreacted and called 911. He was taken emergency room for evaluation and he became quite frustrated with the whole thing. He ruled out for myocardial infarction and his EKG was normal. He was discharged home and close followup was recommended. He's had no chest pain with exertion, but he is very sedentary. He's had no other problems. He denies dyspnea, edema, or PND. At the time of his last visit he was prescribed isosorbide but he felt like he did not need it after taking it for a few days. He has discontinued this on his own. He reports compliance with his other medications.  Outpatient Encounter Prescriptions as of 12/09/2010  Medication Sig Dispense Refill  . aspirin 81 MG tablet Take 81 mg by mouth daily.        . clopidogrel (PLAVIX) 75 MG tablet Take 1 tablet (75 mg total) by mouth daily.  30 tablet  7  . diazepam (VALIUM) 5 MG tablet Take 7.5 mg by mouth daily.        Marland Kitchen HYDROcodone-acetaminophen (NORCO) 5-325 MG per tablet Take 1 tablet by mouth every 6 (six) hours as needed. For pain      . lisinopril (PRINIVIL,ZESTRIL) 20 MG tablet Take 20 mg by mouth daily.        . nitroGLYCERIN (NITROSTAT) 0.4 MG SL tablet Place 0.4 mg under the tongue every 5 (five) minutes as needed. For chest pain      . omeprazole (PRILOSEC) 20 MG capsule Take 20 mg by  mouth once a week.        . risperiDONE (RISPERDAL) 0.5 MG tablet Take 0.5 mg by mouth daily.        . sertraline (ZOLOFT) 100 MG tablet Take 100 mg by mouth daily.        . simvastatin (ZOCOR) 40 MG tablet TAKE 1 TABLET BY MOUTH AT BEDTIME  30 tablet  5  . DISCONTD: isosorbide mononitrate (IMDUR) 30 MG 24 hr tablet Take 1 tablet (30 mg total) by mouth daily.  30 tablet  11    Allergies  Allergen Reactions  . Amitriptyline     Made me go crazy  . Gabapentin     Made me go crazy  . Paroxetine     Made me go crazy    Past Medical History  Diagnosis Date  . 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 Hypertension   . Bradycardia   . Dyslipidemia   . Tobacco abuse   . CVA (cerebral vascular accident)   . History of benign prostatic hypertrophy   . GERD (gastroesophageal reflux disease)   . Anxiety   . History of leukocytosis   . Neuropathy   . COPD (chronic obstructive pulmonary disease)   . Peripheral vascular disease   . Renal failure, chronic  stage 111  . Neuropathy   . MI, old   . Foot pain   . Anxiety     ROS: Negative except as per HPI  BP 114/60  Pulse 66  Resp 18  Ht 5\' 10"  (1.778 m)  Wt 210 lb 1.9 oz (95.31 kg)  BMI 30.15 kg/m2  PHYSICAL EXAM: Pt is alert and oriented, NAD HEENT: normal Neck: JVP - normal, carotids 2+= with a right carotid bruits Lungs: CTA bilaterally CV: RRR without murmur or gallop Abd: soft, NT, Positive BS, no hepatomegaly Ext: no C/C/E, distal pulses intact and equal Skin: warm/dry no rash  EKG: sinus rhythm 66 beats per minute, within normal limits.  ASSESSMENT AND PLAN:

## 2010-12-09 NOTE — Assessment & Plan Note (Signed)
Continues to smoke despite counseling.

## 2010-12-09 NOTE — Assessment & Plan Note (Signed)
The patient appears stable. He does not have typical angina. Nuclear stress test earlier this year with low risk. Continue current medical management and consider cardiac catheterization only for progressive symptoms. Recent episode sounds noncardiac.

## 2010-12-09 NOTE — Assessment & Plan Note (Signed)
Blood pressure is controlled on lisinopril.

## 2010-12-09 NOTE — Patient Instructions (Signed)
Your physician recommends that you continue on your current medications as directed. Please refer to the Current Medication list given to you today.  Your physician wants you to follow-up in: 6 MONTHS. You will receive a reminder letter in the mail two months in advance. If you don't receive a letter, please call our office to schedule the follow-up appointment.  

## 2011-02-03 ENCOUNTER — Other Ambulatory Visit: Payer: Self-pay | Admitting: Internal Medicine

## 2011-02-19 ENCOUNTER — Other Ambulatory Visit: Payer: Self-pay | Admitting: Cardiovascular Disease

## 2011-05-21 ENCOUNTER — Other Ambulatory Visit: Payer: Self-pay | Admitting: *Deleted

## 2011-05-21 MED ORDER — CLOPIDOGREL BISULFATE 75 MG PO TABS: 75.0000 mg | ORAL_TABLET | Freq: Every day | ORAL | Status: DC

## 2011-06-05 ENCOUNTER — Ambulatory Visit: Payer: Medicare Other | Admitting: Cardiovascular Disease

## 2011-07-06 DIAGNOSIS — R3911 Hesitancy of micturition: Secondary | ICD-10-CM | POA: Diagnosis not present

## 2011-07-06 DIAGNOSIS — N138 Other obstructive and reflux uropathy: Secondary | ICD-10-CM | POA: Diagnosis not present

## 2011-07-06 DIAGNOSIS — I2510 Atherosclerotic heart disease of native coronary artery without angina pectoris: Secondary | ICD-10-CM | POA: Diagnosis not present

## 2011-07-06 DIAGNOSIS — N183 Chronic kidney disease, stage 3 unspecified: Secondary | ICD-10-CM | POA: Diagnosis not present

## 2011-07-06 DIAGNOSIS — N401 Enlarged prostate with lower urinary tract symptoms: Secondary | ICD-10-CM | POA: Diagnosis not present

## 2011-07-22 ENCOUNTER — Ambulatory Visit (INDEPENDENT_AMBULATORY_CARE_PROVIDER_SITE_OTHER): Payer: Medicare Other | Admitting: Cardiovascular Disease

## 2011-07-22 ENCOUNTER — Encounter: Payer: Self-pay | Admitting: Cardiovascular Disease

## 2011-07-22 VITALS — BP 136/76 | HR 66 | Ht 70.0 in | Wt 211.0 lb

## 2011-07-22 DIAGNOSIS — I2510 Atherosclerotic heart disease of native coronary artery without angina pectoris: Secondary | ICD-10-CM

## 2011-07-22 NOTE — Patient Instructions (Signed)
Your physician wants you to follow-up in:  12 months.  You will receive a reminder letter in the mail two months in advance. If you don't receive a letter, please call our office to schedule the follow-up appointment.   

## 2011-07-22 NOTE — Progress Notes (Signed)
HPI:  [name] is a 71 year old gentleman presenting for followup evaluation. He has coronary artery disease and had an inferior MI in 2010. He was treated with a bare-metal stent. Cardiac catheterization was performed in 2011 showed no high-grade obstructive disease. His most significant lesion was a 50% proximal edge stenosis in the right coronary artery. He has been managed medically with no recurrent ischemic events. His left ventricular ejection fraction has been preserved with an LVEF of 50-55% by ventriculography. He does have hypokinesis of the inferior wall.  The patient has had no recent chest pain. He had a stress test one year ago that showed no large areas of ischemia. He has chronic dyspnea, unchanged over time. He continues to smoke one pack of cigarettes daily and is not interested in quitting. He denies leg swelling, palpitations, lightheadedness, or syncope. The patient is very inactive. He states "I stay home 24/7 except to get haircuts and go to the doctor. "  He's been having worsening problems with urinary retention and hesitancy. He is going for urologic evaluation tomorrow.  Outpatient Encounter Prescriptions as of 07/22/2011  Medication Sig Dispense Refill  . aspirin 81 MG tablet Take 81 mg by mouth daily.        . clopidogrel (PLAVIX) 75 MG tablet Take 1 tablet (75 mg total) by mouth daily.  30 tablet  7  . diazepam (VALIUM) 5 MG tablet Take 7.5 mg by mouth daily.        Marland Kitchen HYDROcodone-acetaminophen (NORCO) 5-325 MG per tablet Take 1 tablet by mouth every 6 (six) hours as needed. For pain      . isosorbide mononitrate (IMDUR) 30 MG 24 hr tablet Take 30 mg by mouth daily.      Marland Kitchen lisinopril (PRINIVIL,ZESTRIL) 20 MG tablet Take 20 mg by mouth daily.        Marland Kitchen NITROSTAT 0.4 MG SL tablet PLACE ONE TABLET UNDER TONGUE AS NEEDED  25 tablet  3  . omeprazole (PRILOSEC) 20 MG capsule Take 20 mg by mouth once a week.        . risperiDONE (RISPERDAL) 0.5 MG tablet Take 0.5 mg by  mouth daily.        . sertraline (ZOLOFT) 100 MG tablet Take 100 mg by mouth daily.        . simvastatin (ZOCOR) 40 MG tablet TAKE 1 TABLET BY MOUTH AT BEDTIME  30 tablet  5    Allergies  Allergen Reactions  . Amitriptyline     Made me go crazy  . Gabapentin     Made me go crazy  . Paroxetine     Made me go crazy    Past Medical History  Diagnosis Date  . 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 Hypertension   . Bradycardia   . Dyslipidemia   . Tobacco abuse   . CVA (cerebral vascular accident)   . History of benign prostatic hypertrophy   . GERD (gastroesophageal reflux disease)   . Anxiety   . History of leukocytosis   . Neuropathy   . COPD (chronic obstructive pulmonary disease)   . Peripheral vascular disease   . 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 Neuropathy   . MI, old   . Foot pain   . Anxiety     ROS: Negative except as per HPI  BP 136/76  Pulse 66  Ht 5\' 10"  (1.778 m)  Wt 95.709 kg (211 lb)  BMI 30.28 kg/m2  PHYSICAL EXAM: Pt is alert and oriented, NAD HEENT: normal Neck: JVP - normal, carotids 2+= without bruits Lungs: CTA bilaterally CV: RRR without murmur or gallop Abd: soft, NT, Positive BS Ext: no C/C/E Skin: warm/dry no rash  EKG:  Normal sinus rhythm 64 beats per minute, cannot exclude age indeterminate inferior infarct, otherwise within normal limits there  ASSESSMENT AND PLAN: 1. Coronary artery disease status post inferior MI. Continue current medical management. He is on a combination of aspirin, Plavix, Imdur, and lisinopril. If he requires urologic surgery, he can temporarily hold Plavix as he is at low risk from a cardiac perspective now a few years out from his MI.  2. Hypertension. Well controlled on a combination of lisinopril and isosorbide  3. Chronic tobacco use. He has received extensive cessation counseling and is not interested in quitting.  4. Hyperlipidemia. Patient is  followed by Dr. Brigitte Pulse. He is on simvastatin 40 mg daily.  5. Followup. I will see the patient back in 12 months unless problems arise in the interim.  Sherren Mocha 07/22/2011 11:08 AM

## 2011-07-23 DIAGNOSIS — R3911 Hesitancy of micturition: Secondary | ICD-10-CM | POA: Diagnosis not present

## 2011-07-23 DIAGNOSIS — R3915 Urgency of urination: Secondary | ICD-10-CM | POA: Diagnosis not present

## 2011-07-27 DIAGNOSIS — F331 Major depressive disorder, recurrent, moderate: Secondary | ICD-10-CM | POA: Diagnosis not present

## 2011-08-25 ENCOUNTER — Other Ambulatory Visit: Payer: Self-pay | Admitting: Cardiovascular Disease

## 2011-08-25 NOTE — Telephone Encounter (Signed)
Fax Received. Refill Completed. [name] (R.M.A)   

## 2011-09-01 DIAGNOSIS — R3911 Hesitancy of micturition: Secondary | ICD-10-CM | POA: Diagnosis not present

## 2011-09-01 DIAGNOSIS — R3915 Urgency of urination: Secondary | ICD-10-CM | POA: Diagnosis not present

## 2011-09-30 DIAGNOSIS — N401 Enlarged prostate with lower urinary tract symptoms: Secondary | ICD-10-CM | POA: Diagnosis not present

## 2011-09-30 DIAGNOSIS — R3915 Urgency of urination: Secondary | ICD-10-CM | POA: Diagnosis not present

## 2011-09-30 DIAGNOSIS — N138 Other obstructive and reflux uropathy: Secondary | ICD-10-CM | POA: Diagnosis not present

## 2011-09-30 DIAGNOSIS — R3911 Hesitancy of micturition: Secondary | ICD-10-CM | POA: Diagnosis not present

## 2011-10-09 ENCOUNTER — Encounter: Payer: Self-pay | Admitting: Cardiovascular Disease

## 2011-10-09 DIAGNOSIS — R7301 Impaired fasting glucose: Secondary | ICD-10-CM | POA: Diagnosis not present

## 2011-10-09 DIAGNOSIS — Z125 Encounter for screening for malignant neoplasm of prostate: Secondary | ICD-10-CM | POA: Diagnosis not present

## 2011-10-09 DIAGNOSIS — E785 Hyperlipidemia, unspecified: Secondary | ICD-10-CM | POA: Diagnosis not present

## 2011-10-09 DIAGNOSIS — I10 Essential (primary) hypertension: Secondary | ICD-10-CM | POA: Diagnosis not present

## 2011-10-16 ENCOUNTER — Encounter: Payer: Self-pay | Admitting: Cardiovascular Disease

## 2011-10-16 DIAGNOSIS — R82998 Other abnormal findings in urine: Secondary | ICD-10-CM | POA: Diagnosis not present

## 2011-10-16 DIAGNOSIS — I10 Essential (primary) hypertension: Secondary | ICD-10-CM | POA: Diagnosis not present

## 2011-10-16 DIAGNOSIS — I739 Peripheral vascular disease, unspecified: Secondary | ICD-10-CM | POA: Diagnosis not present

## 2011-10-16 DIAGNOSIS — Z23 Encounter for immunization: Secondary | ICD-10-CM | POA: Diagnosis not present

## 2011-10-16 DIAGNOSIS — Z0000 Encounter for general adult medical examination without abnormal findings: Secondary | ICD-10-CM | POA: Diagnosis not present

## 2011-10-16 DIAGNOSIS — J449 Chronic obstructive pulmonary disease, unspecified: Secondary | ICD-10-CM | POA: Diagnosis not present

## 2011-10-16 DIAGNOSIS — N183 Chronic kidney disease, stage 3 unspecified: Secondary | ICD-10-CM | POA: Diagnosis not present

## 2011-10-16 DIAGNOSIS — F17200 Nicotine dependence, unspecified, uncomplicated: Secondary | ICD-10-CM | POA: Diagnosis not present

## 2011-10-20 DIAGNOSIS — Z1212 Encounter for screening for malignant neoplasm of rectum: Secondary | ICD-10-CM | POA: Diagnosis not present

## 2011-12-08 DIAGNOSIS — R3915 Urgency of urination: Secondary | ICD-10-CM | POA: Diagnosis not present

## 2011-12-08 DIAGNOSIS — R3911 Hesitancy of micturition: Secondary | ICD-10-CM | POA: Diagnosis not present

## 2011-12-17 DIAGNOSIS — N138 Other obstructive and reflux uropathy: Secondary | ICD-10-CM | POA: Diagnosis not present

## 2011-12-17 DIAGNOSIS — R3911 Hesitancy of micturition: Secondary | ICD-10-CM | POA: Diagnosis not present

## 2011-12-17 DIAGNOSIS — N401 Enlarged prostate with lower urinary tract symptoms: Secondary | ICD-10-CM | POA: Diagnosis not present

## 2012-01-25 DIAGNOSIS — F331 Major depressive disorder, recurrent, moderate: Secondary | ICD-10-CM | POA: Diagnosis not present

## 2012-02-19 ENCOUNTER — Other Ambulatory Visit: Payer: Self-pay | Admitting: Cardiovascular Disease

## 2012-02-29 ENCOUNTER — Encounter: Payer: Self-pay | Admitting: Vascular Surgery

## 2012-03-02 ENCOUNTER — Other Ambulatory Visit: Payer: Self-pay | Admitting: Cardiovascular Disease

## 2012-03-07 DIAGNOSIS — Z23 Encounter for immunization: Secondary | ICD-10-CM | POA: Diagnosis not present

## 2012-03-21 ENCOUNTER — Other Ambulatory Visit: Payer: Self-pay | Admitting: Cardiovascular Disease

## 2012-04-12 DIAGNOSIS — I10 Essential (primary) hypertension: Secondary | ICD-10-CM | POA: Diagnosis not present

## 2012-04-12 DIAGNOSIS — E785 Hyperlipidemia, unspecified: Secondary | ICD-10-CM | POA: Diagnosis not present

## 2012-04-12 DIAGNOSIS — J449 Chronic obstructive pulmonary disease, unspecified: Secondary | ICD-10-CM | POA: Diagnosis not present

## 2012-04-12 DIAGNOSIS — R7301 Impaired fasting glucose: Secondary | ICD-10-CM | POA: Diagnosis not present

## 2012-04-20 ENCOUNTER — Other Ambulatory Visit: Payer: Self-pay | Admitting: Cardiovascular Disease

## 2012-05-18 ENCOUNTER — Other Ambulatory Visit: Payer: Self-pay | Admitting: Cardiovascular Disease

## 2012-07-15 ENCOUNTER — Encounter: Payer: Self-pay | Admitting: Cardiovascular Disease

## 2012-07-15 ENCOUNTER — Ambulatory Visit (INDEPENDENT_AMBULATORY_CARE_PROVIDER_SITE_OTHER): Payer: Medicare Other | Admitting: Cardiovascular Disease

## 2012-07-15 VITALS — BP 139/70 | HR 77 | Ht 70.0 in | Wt 187.0 lb

## 2012-07-15 DIAGNOSIS — I2510 Atherosclerotic heart disease of native coronary artery without angina pectoris: Secondary | ICD-10-CM

## 2012-07-15 NOTE — Patient Instructions (Addendum)
Your physician wants you to follow-up in: 1 YEAR with Dr Cooper.  You will receive a reminder letter in the mail two months in advance. If you don't receive a letter, please call our office to schedule the follow-up appointment.  Your physician recommends that you continue on your current medications as directed. Please refer to the Current Medication list given to you today.  

## 2012-07-15 NOTE — Progress Notes (Signed)
HPI:  72 year old gentleman presenting for followup evaluation. He was last seen one year ago. The patient is followed for coronary artery disease. He initially presented in 2010 with an inferior wall MI. He was treated with a bare-metal stent platform. He underwent repeat cardiac catheterization in 2011 that showed a 50% stenosis at the proximal edge of his right coronary artery stent. He otherwise had no obstructive disease. His left ventricular ejection fraction has been in the range of 50-55%.  The patient has no cardiac related complaints. He specifically denies chest pain, chest pressure, shortness of breath, or leg swelling. He is very sedentary. He is limited from neuropathy and residual weakness related to stroke. He smokes over one pack of cigarettes daily. He doesn't weight is calcified.  Outpatient Encounter Prescriptions as of 07/15/2012  Medication Sig Dispense Refill  . aspirin 81 MG tablet Take 81 mg by mouth daily.        . clopidogrel (PLAVIX) 75 MG tablet TAKE 1 TABLET BY MOUTH DAILY  30 tablet  7  . diazepam (VALIUM) 5 MG tablet Take 7.5 mg by mouth daily.        Marland Kitchen HYDROcodone-acetaminophen (NORCO) 5-325 MG per tablet Take 1 tablet by mouth every 6 (six) hours as needed. For pain      . isosorbide mononitrate (IMDUR) 30 MG 24 hr tablet Take 30 mg by mouth daily.      Marland Kitchen NITROSTAT 0.4 MG SL tablet PLACE ONE TABLET UNDER TONGUE AS NEEDED  25 tablet  3  . omeprazole (PRILOSEC) 20 MG capsule Take 20 mg by mouth once a week.        . risperiDONE (RISPERDAL) 0.5 MG tablet Take 0.5 mg by mouth daily.        . sertraline (ZOLOFT) 100 MG tablet Take 100 mg by mouth daily.        . simvastatin (ZOCOR) 40 MG tablet TAKE 1 TABLET BY MOUTH AT BEDTIME  30 tablet  5  . [DISCONTINUED] isosorbide mononitrate (IMDUR) 30 MG 24 hr tablet TAKE 1 TABLET EVERY DAY  30 tablet  4  . [DISCONTINUED] lisinopril (PRINIVIL,ZESTRIL) 20 MG tablet Take 20 mg by mouth daily.        . [DISCONTINUED] simvastatin  (ZOCOR) 40 MG tablet TAKE 1 TABLET BY MOUTH AT BEDTIME  30 tablet  5   No facility-administered encounter medications on file as of 07/15/2012.    Allergies  Allergen Reactions  . Amitriptyline     Made me go crazy  . Gabapentin     Made me go crazy  . Paroxetine     Made me go crazy    Past Medical History  Diagnosis Date  . 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 Hypertension   . Bradycardia   . Dyslipidemia   . Tobacco abuse   . CVA (cerebral vascular accident)   . History of benign prostatic hypertrophy   . GERD (gastroesophageal reflux disease)   . Anxiety   . History of leukocytosis   . Neuropathy   . COPD (chronic obstructive pulmonary disease)   . Peripheral vascular disease   . 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 Neuropathy   . MI, old   . Foot pain   . Anxiety     ROS: Negative except as per HPI  BP 139/70  Pulse 77  Ht 5\' 10"  (1.778 m)  Wt 84.823 kg (  187 lb)  BMI 26.83 kg/m2  PHYSICAL EXAM: Pt is alert and oriented, NAD HEENT: normal Neck: JVP - normal, carotids 2+= with a right carotid bruit Lungs: CTA bilaterally CV: RRR without murmur or gallop Abd: soft, NT, Positive BS, no hepatomegaly Ext: no C/C/E, distal pulses intact and equal Skin: warm/dry no rash  EKG:  Normal sinus rhythm, possible old inferior-lateral infarct, otherwise within normal limits  ASSESSMENT AND PLAN: 1. Coronary artery disease, native vessel. The patient has a history of myocardial infarction. He's having no recurrence of angina.hould remain on a combination of aspirin, Plavix, isosorbide, and simvastatin. I believe he is also on Plavix for peripheral arterial disease. He understands that tobacco cessation is the primary way that he could reduce his risk of recurrent myocardial infarction.  2. Hyperlipidemia. The patient is on simvastatin 40 mg. His lipids are followed by Dr. Brigitte Pulse.  3. Hypertension. Blood pressure is  within limits on isosorbide. He is no longer on lisinopril.  4. Right carotid bruit. This needs followup. He has been followed by [facility] in the past and will call them for an ultrasound.  For followup I will see him back in one year.  Sherren Mocha 07/15/2012 9:27 AM

## 2012-07-19 ENCOUNTER — Other Ambulatory Visit: Payer: Self-pay | Admitting: *Deleted

## 2012-07-25 DIAGNOSIS — F331 Major depressive disorder, recurrent, moderate: Secondary | ICD-10-CM | POA: Diagnosis not present

## 2012-08-29 ENCOUNTER — Other Ambulatory Visit: Payer: Self-pay | Admitting: Cardiovascular Disease

## 2012-09-07 ENCOUNTER — Encounter: Payer: Self-pay | Admitting: Vascular Surgery

## 2012-09-08 ENCOUNTER — Ambulatory Visit (INDEPENDENT_AMBULATORY_CARE_PROVIDER_SITE_OTHER): Payer: Medicare Other | Admitting: Vascular Surgery

## 2012-09-08 ENCOUNTER — Other Ambulatory Visit: Payer: Self-pay | Admitting: *Deleted

## 2012-09-08 ENCOUNTER — Other Ambulatory Visit (INDEPENDENT_AMBULATORY_CARE_PROVIDER_SITE_OTHER): Payer: Medicare Other | Admitting: Vascular Surgery

## 2012-09-08 ENCOUNTER — Encounter: Payer: Self-pay | Admitting: Vascular Surgery

## 2012-09-08 DIAGNOSIS — I6529 Occlusion and stenosis of unspecified carotid artery: Secondary | ICD-10-CM

## 2012-09-08 DIAGNOSIS — R0989 Other specified symptoms and signs involving the circulatory and respiratory systems: Secondary | ICD-10-CM | POA: Diagnosis not present

## 2012-09-08 NOTE — Progress Notes (Signed)
[facility] [place] HISTORY AND PHYSICAL   History of Present Illness:  Patient is a 72 y.o. year old male who presents for follow-up evaluation for carotid stenosis.  He is on Aspirin for antiplatelet therapy.  His atherosclerotic risk factors remain elevated cholesterol, hypertension, smoking, and coronary artery disease.  These are all currently stable and followed by his primary care physician.  He is trying to quit smoking and is currently on electronic cigarettes which he has weaned down to 12 mcg. He denies any new neurologic events including amaurosis, numbness, or weakness. He has also previously had a right external iliac stent and left common iliac stent in 2011. He denies claudication symptoms. 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  Past Medical History  Diagnosis Date  . 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 Hypertension   . Bradycardia   . Dyslipidemia   . Tobacco abuse   . CVA (cerebral vascular accident)   . History of benign prostatic hypertrophy   . GERD (gastroesophageal reflux disease)   . Anxiety   . History of leukocytosis   . Neuropathy   . COPD (chronic obstructive pulmonary disease)   . Peripheral vascular disease   . 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 Neuropathy   . MI, old   . Foot pain   . Anxiety   . DVT (deep venous thrombosis)     Past Surgical History  Procedure Laterality Date  . Intervention of the rca      using a single BMS  . Ankle surgery    . Bypass graft  2009 or  2010    stent  . Cholecystectomy      Gall Bladder    Review of Systems:  Neurologic: as above Cardiac:denies shortness of breath or chest pain Pulmonary: denies cough or wheeze  Social History History  Substance Use Topics  . Smoking status: Current Every Day Smoker -- 1.00 packs/day    Types: Cigarettes  . Smokeless tobacco: Former Systems developer  . Alcohol Use: No     Allergies  Allergies  Allergen Reactions  . Amitriptyline     Made me go crazy  . Gabapentin     Made me go crazy  . Paroxetine     Made me go crazy     Current Outpatient Prescriptions  Medication Sig Dispense Refill  . aspirin 81 MG tablet Take 81 mg by mouth daily.        . clopidogrel (PLAVIX) 75 MG tablet TAKE 1 TABLET BY MOUTH DAILY  30 tablet  7  . diazepam (VALIUM) 5 MG tablet Take 7.5 mg by mouth daily.        Marland Kitchen HYDROcodone-acetaminophen (NORCO) 5-325 MG per tablet Take 1 tablet by mouth every 6 (six) hours as needed. For pain      . isosorbide mononitrate (IMDUR) 30 MG 24 hr tablet Take 30 mg by mouth daily.      Marland Kitchen lisinopril (PRINIVIL,ZESTRIL) 10 MG tablet Take 10 mg by mouth daily.      Marland Kitchen NITROSTAT 0.4 MG SL tablet PLACE ONE TABLET UNDER TONGUE AS NEEDED  25 tablet  3  . omeprazole (PRILOSEC) 20 MG capsule Take 20 mg by mouth once a week.        . risperiDONE (RISPERDAL) 0.5 MG tablet Take 0.5 mg by mouth daily.        Marland Kitchen  sertraline (ZOLOFT) 100 MG tablet Take 100 mg by mouth daily.        . Silodosin (RAPAFLO PO) Take 8 mg by mouth daily.      . simvastatin (ZOCOR) 40 MG tablet TAKE 1 TABLET BY MOUTH AT BEDTIME  30 tablet  5  . finasteride (PROSCAR) 5 MG tablet       . simvastatin (ZOCOR) 40 MG tablet TAKE 1 TABLET BY MOUTH AT BEDTIME  30 tablet  5   No current facility-administered medications for this visit.    Physical Examination  Filed Vitals:   09/08/12 1215 09/08/12 1218  BP: 159/81 163/68  Pulse: 55 54  Temp:  94.5 F (34.7 C)  TempSrc:  Oral  Resp: 16   Height: 5\' 10"  (1.778 m)   Weight: 190 lb (86.183 kg)   SpO2: 99%     Body mass index is 27.26 kg/(m^2).  General:  Alert and oriented, no acute distress HEENT: Normal Neck: No bruit or JVD Pulmonary: Clear to auscultation bilaterally Cardiac: Regular Rate and Rhythm without murmur Neurologic: Upper and lower extremity motor 5/5 and symmetric Vascular: 2+ femoral pulses  bilaterally  DATA: Patient had bilateral carotid duplex exam today which I reviewed and interpreted. This showed a right internal carotid artery less than 40% left 40-60% right vertebral flow is blunted but antegrade left vertebral artery is patent and antegrade   ASSESSMENT: Stable bilateral moderate carotid stenosis patent bilateral iliac stents clinically   PLAN:  Followup carotid duplex scan in 6 months. The patient will try to continue to quit smoking. Greater than 3 minutes they spent regarding smoking cessation counseling. Continue aspirin.  Ruta Hinds, MD [facility] Office: [phone] Pager: [phone]

## 2012-10-11 ENCOUNTER — Encounter: Payer: Self-pay | Admitting: Cardiovascular Disease

## 2012-10-11 DIAGNOSIS — E785 Hyperlipidemia, unspecified: Secondary | ICD-10-CM | POA: Diagnosis not present

## 2012-10-11 DIAGNOSIS — Z125 Encounter for screening for malignant neoplasm of prostate: Secondary | ICD-10-CM | POA: Diagnosis not present

## 2012-10-11 DIAGNOSIS — I10 Essential (primary) hypertension: Secondary | ICD-10-CM | POA: Diagnosis not present

## 2012-10-11 DIAGNOSIS — R7301 Impaired fasting glucose: Secondary | ICD-10-CM | POA: Diagnosis not present

## 2012-10-17 ENCOUNTER — Other Ambulatory Visit: Payer: Self-pay | Admitting: Cardiovascular Disease

## 2012-10-18 ENCOUNTER — Encounter: Payer: Self-pay | Admitting: Cardiovascular Disease

## 2012-10-18 DIAGNOSIS — Z6827 Body mass index (BMI) 27.0-27.9, adult: Secondary | ICD-10-CM | POA: Diagnosis not present

## 2012-10-18 DIAGNOSIS — Z23 Encounter for immunization: Secondary | ICD-10-CM | POA: Diagnosis not present

## 2012-10-18 DIAGNOSIS — Z0000 Encounter for general adult medical examination without abnormal findings: Secondary | ICD-10-CM | POA: Diagnosis not present

## 2012-10-18 DIAGNOSIS — J449 Chronic obstructive pulmonary disease, unspecified: Secondary | ICD-10-CM | POA: Diagnosis not present

## 2012-10-18 DIAGNOSIS — I2510 Atherosclerotic heart disease of native coronary artery without angina pectoris: Secondary | ICD-10-CM | POA: Diagnosis not present

## 2012-10-18 DIAGNOSIS — E785 Hyperlipidemia, unspecified: Secondary | ICD-10-CM | POA: Diagnosis not present

## 2012-10-18 DIAGNOSIS — I10 Essential (primary) hypertension: Secondary | ICD-10-CM | POA: Diagnosis not present

## 2012-10-18 DIAGNOSIS — R7301 Impaired fasting glucose: Secondary | ICD-10-CM | POA: Diagnosis not present

## 2012-10-18 DIAGNOSIS — F17200 Nicotine dependence, unspecified, uncomplicated: Secondary | ICD-10-CM | POA: Diagnosis not present

## 2012-10-18 DIAGNOSIS — I739 Peripheral vascular disease, unspecified: Secondary | ICD-10-CM | POA: Diagnosis not present

## 2012-10-25 DIAGNOSIS — Z1212 Encounter for screening for malignant neoplasm of rectum: Secondary | ICD-10-CM | POA: Diagnosis not present

## 2012-12-13 ENCOUNTER — Other Ambulatory Visit: Payer: Self-pay | Admitting: Cardiovascular Disease

## 2012-12-21 DIAGNOSIS — N400 Enlarged prostate without lower urinary tract symptoms: Secondary | ICD-10-CM | POA: Diagnosis not present

## 2013-01-23 DIAGNOSIS — F331 Major depressive disorder, recurrent, moderate: Secondary | ICD-10-CM | POA: Diagnosis not present

## 2013-03-01 ENCOUNTER — Other Ambulatory Visit: Payer: Self-pay | Admitting: Cardiovascular Disease

## 2013-03-13 ENCOUNTER — Other Ambulatory Visit: Payer: Self-pay | Admitting: Cardiovascular Disease

## 2013-03-15 ENCOUNTER — Encounter: Payer: Self-pay | Admitting: Vascular Surgery

## 2013-03-16 ENCOUNTER — Ambulatory Visit (INDEPENDENT_AMBULATORY_CARE_PROVIDER_SITE_OTHER): Payer: Medicare Other | Admitting: Vascular Surgery

## 2013-03-16 ENCOUNTER — Ambulatory Visit (HOSPITAL_COMMUNITY)
Admission: RE | Admit: 2013-03-16 | Discharge: 2013-03-16 | Disposition: A | Discharge status: Home / Self Care | Payer: Medicare Other | Source: Ambulatory Visit | Attending: Vascular Surgery | Admitting: Vascular Surgery

## 2013-03-16 ENCOUNTER — Encounter: Payer: Self-pay | Admitting: Vascular Surgery

## 2013-03-16 VITALS — BP 133/75 | HR 88 | Ht 70.0 in | Wt 190.6 lb

## 2013-03-16 DIAGNOSIS — I6529 Occlusion and stenosis of unspecified carotid artery: Secondary | ICD-10-CM

## 2013-03-16 DIAGNOSIS — Z48812 Encounter for surgical aftercare following surgery on the circulatory system: Secondary | ICD-10-CM | POA: Diagnosis not present

## 2013-03-16 DIAGNOSIS — I658 Occlusion and stenosis of other precerebral arteries: Secondary | ICD-10-CM | POA: Diagnosis not present

## 2013-03-16 NOTE — Progress Notes (Signed)
[facility] [place] HISTORY AND PHYSICAL   History of Present Illness:  Patient is a 73 y.o. year old male who presents for follow-up evaluation for carotid stenosis.  He is on Aspirin and Plavix for antiplatelet therapy.  His atherosclerotic risk factors remain elevated cholesterol, hypertension, smoking, and coronary artery disease.  These are all currently stable and followed by his primary care physician.  He is trying to quit smoking and is currently on electronic cigarettes which he has weaned down to 12 mcg. He denies any new neurologic events including amaurosis, numbness, or weakness. He has also previously had a right external iliac stent and left common iliac stent in 2011. He denies claudication symptoms. 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    Past Medical History   Diagnosis  Date   .  Coronary artery disease         s/p acute inferoposterior MI secondary to RCA occlusion   .  Hypertension     .  Bradycardia     .  Dyslipidemia     .  Tobacco abuse     .  CVA (cerebral vascular accident)     .  History of benign prostatic hypertrophy     .  GERD (gastroesophageal reflux disease)     .  Anxiety     .  History of leukocytosis     .  Neuropathy     .  COPD (chronic obstructive pulmonary disease)     .  Peripheral vascular disease     .  Renal failure, chronic         stage 111   .  Neuropathy     .  MI, old     .  Foot pain     .  Anxiety     .  DVT (deep venous thrombosis)         Past Surgical History   Procedure  Laterality  Date   .  Intervention of the rca           using a single BMS   .  Ankle surgery       .  Bypass graft    2009 or  2010       stent   .  Cholecystectomy           Gall Bladder     Review of Systems:  Neurologic: as above Cardiac:denies shortness of breath or chest pain Pulmonary: denies cough or wheeze  Social History History   Substance Use Topics   .  Smoking status:  Current Every Day Smoker  -- 1.00 packs/day       Types:  Cigarettes   .  Smokeless tobacco:  Former Systems developer   .  Alcohol Use:  No     Allergies    Allergies   Allergen  Reactions   .  Amitriptyline         Made me go crazy   .  Gabapentin         Made me go crazy   .  Paroxetine         Made me go crazy    Current Outpatient Prescriptions on File Prior to Visit  Medication Sig Dispense Refill  . aspirin 81 MG tablet Take 81 mg by mouth daily.        . clopidogrel (PLAVIX) 75 MG tablet TAKE 1 TABLET BY MOUTH DAILY  30 tablet  5  .  diazepam (VALIUM) 5 MG tablet Take 7.5 mg by mouth daily.        . finasteride (PROSCAR) 5 MG tablet       . HYDROcodone-acetaminophen (NORCO) 5-325 MG per tablet Take 1 tablet by mouth every 6 (six) hours as needed. For pain      . isosorbide mononitrate (IMDUR) 30 MG 24 hr tablet Take 30 mg by mouth daily.      . isosorbide mononitrate (IMDUR) 30 MG 24 hr tablet TAKE 1 TABLET EVERY DAY  30 tablet  2  . lisinopril (PRINIVIL,ZESTRIL) 10 MG tablet Take 10 mg by mouth daily.      Marland Kitchen NITROSTAT 0.4 MG SL tablet PLACE ONE TABLET UNDER TONGUE AS NEEDED  25 tablet  1  . omeprazole (PRILOSEC) 20 MG capsule Take 20 mg by mouth once a week.        . risperiDONE (RISPERDAL) 0.5 MG tablet Take 0.5 mg by mouth daily.        . sertraline (ZOLOFT) 100 MG tablet Take 100 mg by mouth daily.        . Silodosin (RAPAFLO PO) Take 8 mg by mouth daily.      . simvastatin (ZOCOR) 40 MG tablet TAKE 1 TABLET BY MOUTH AT BEDTIME  30 tablet  5  . simvastatin (ZOCOR) 40 MG tablet TAKE 1 TABLET BY MOUTH AT BEDTIME  30 tablet  5   No current facility-administered medications on file prior to visit.    Physical Examination    Filed Vitals:   03/16/13 1117 03/16/13 1120  BP: 153/78 133/75  Pulse: 88   Height: 5\' 10"  (1.778 m)   Weight: 190 lb 9.6 oz (86.456 kg)   SpO2: 100%     General:  Alert and oriented, no acute distress HEENT: Normal Neck: No bruit or JVD Pulmonary: Clear to auscultation  bilaterally Cardiac: Regular Rate and Rhythm without murmur Neurologic: Upper and lower extremity motor 5/5 and symmetric Vascular: 2+ femoral pulses bilaterally  DATA: Patient had bilateral carotid duplex exam today which I reviewed and interpreted. This showed a right internal carotid artery less than 40% left 60-80% antegrade vertebral flow  ASSESSMENT: Stable bilateral moderate carotid stenosis patent bilateral iliac stents clinically   PLAN:  Followup carotid duplex scan in 6 months.  Continue aspirin. The patient will try to continue to wean the nicotine out of his electronic cigarettes.  Ruta Hinds, MD [facility] [place] Office: [phone] Pager: [phone]

## 2013-04-17 DIAGNOSIS — E785 Hyperlipidemia, unspecified: Secondary | ICD-10-CM | POA: Diagnosis not present

## 2013-04-17 DIAGNOSIS — J449 Chronic obstructive pulmonary disease, unspecified: Secondary | ICD-10-CM | POA: Diagnosis not present

## 2013-04-17 DIAGNOSIS — I2510 Atherosclerotic heart disease of native coronary artery without angina pectoris: Secondary | ICD-10-CM | POA: Diagnosis not present

## 2013-04-17 DIAGNOSIS — I10 Essential (primary) hypertension: Secondary | ICD-10-CM | POA: Diagnosis not present

## 2013-04-17 DIAGNOSIS — Z1331 Encounter for screening for depression: Secondary | ICD-10-CM | POA: Diagnosis not present

## 2013-04-17 DIAGNOSIS — I739 Peripheral vascular disease, unspecified: Secondary | ICD-10-CM | POA: Diagnosis not present

## 2013-04-17 DIAGNOSIS — I252 Old myocardial infarction: Secondary | ICD-10-CM | POA: Diagnosis not present

## 2013-04-17 DIAGNOSIS — Z23 Encounter for immunization: Secondary | ICD-10-CM | POA: Diagnosis not present

## 2013-04-17 DIAGNOSIS — R7301 Impaired fasting glucose: Secondary | ICD-10-CM | POA: Diagnosis not present

## 2013-04-17 DIAGNOSIS — N183 Chronic kidney disease, stage 3 unspecified: Secondary | ICD-10-CM | POA: Diagnosis not present

## 2013-06-02 DIAGNOSIS — I10 Essential (primary) hypertension: Secondary | ICD-10-CM | POA: Diagnosis not present

## 2013-06-02 DIAGNOSIS — R609 Edema, unspecified: Secondary | ICD-10-CM | POA: Diagnosis not present

## 2013-06-02 DIAGNOSIS — I739 Peripheral vascular disease, unspecified: Secondary | ICD-10-CM | POA: Diagnosis not present

## 2013-06-02 DIAGNOSIS — I2510 Atherosclerotic heart disease of native coronary artery without angina pectoris: Secondary | ICD-10-CM | POA: Diagnosis not present

## 2013-06-07 DIAGNOSIS — I10 Essential (primary) hypertension: Secondary | ICD-10-CM | POA: Diagnosis not present

## 2013-06-07 DIAGNOSIS — R609 Edema, unspecified: Secondary | ICD-10-CM | POA: Diagnosis not present

## 2013-06-07 DIAGNOSIS — Z6827 Body mass index (BMI) 27.0-27.9, adult: Secondary | ICD-10-CM | POA: Diagnosis not present

## 2013-06-14 ENCOUNTER — Other Ambulatory Visit: Payer: Self-pay | Admitting: Cardiovascular Disease

## 2013-07-11 DIAGNOSIS — N401 Enlarged prostate with lower urinary tract symptoms: Secondary | ICD-10-CM | POA: Diagnosis not present

## 2013-07-11 DIAGNOSIS — N138 Other obstructive and reflux uropathy: Secondary | ICD-10-CM | POA: Diagnosis not present

## 2013-07-11 DIAGNOSIS — J449 Chronic obstructive pulmonary disease, unspecified: Secondary | ICD-10-CM | POA: Diagnosis not present

## 2013-07-11 DIAGNOSIS — R609 Edema, unspecified: Secondary | ICD-10-CM | POA: Diagnosis not present

## 2013-07-11 DIAGNOSIS — I10 Essential (primary) hypertension: Secondary | ICD-10-CM | POA: Diagnosis not present

## 2013-07-11 DIAGNOSIS — N183 Chronic kidney disease, stage 3 unspecified: Secondary | ICD-10-CM | POA: Diagnosis not present

## 2013-07-11 DIAGNOSIS — F17200 Nicotine dependence, unspecified, uncomplicated: Secondary | ICD-10-CM | POA: Diagnosis not present

## 2013-07-11 DIAGNOSIS — Z6829 Body mass index (BMI) 29.0-29.9, adult: Secondary | ICD-10-CM | POA: Diagnosis not present

## 2013-07-12 DIAGNOSIS — R39198 Other difficulties with micturition: Secondary | ICD-10-CM | POA: Diagnosis not present

## 2013-07-12 DIAGNOSIS — R3911 Hesitancy of micturition: Secondary | ICD-10-CM | POA: Diagnosis not present

## 2013-07-14 ENCOUNTER — Other Ambulatory Visit: Payer: Self-pay | Admitting: Cardiovascular Disease

## 2013-07-18 ENCOUNTER — Other Ambulatory Visit: Payer: Self-pay | Admitting: Cardiovascular Disease

## 2013-07-19 ENCOUNTER — Encounter: Payer: Self-pay | Admitting: Cardiovascular Disease

## 2013-07-19 ENCOUNTER — Ambulatory Visit (INDEPENDENT_AMBULATORY_CARE_PROVIDER_SITE_OTHER): Payer: Medicare Other | Admitting: Cardiovascular Disease

## 2013-07-19 VITALS — BP 130/78 | HR 86 | Ht 70.0 in | Wt 201.0 lb

## 2013-07-19 DIAGNOSIS — R609 Edema, unspecified: Secondary | ICD-10-CM | POA: Diagnosis not present

## 2013-07-19 DIAGNOSIS — M79606 Pain in leg, unspecified: Secondary | ICD-10-CM

## 2013-07-19 DIAGNOSIS — M79609 Pain in unspecified limb: Secondary | ICD-10-CM | POA: Diagnosis not present

## 2013-07-19 DIAGNOSIS — I2510 Atherosclerotic heart disease of native coronary artery without angina pectoris: Secondary | ICD-10-CM | POA: Diagnosis not present

## 2013-07-19 DIAGNOSIS — I4891 Unspecified atrial fibrillation: Secondary | ICD-10-CM | POA: Diagnosis not present

## 2013-07-19 LAB — BASIC METABOLIC PANEL
BUN: 10 mg/dL (ref 6–23)
CO2: 30 mEq/L (ref 19–32)
Calcium: 9.1 mg/dL (ref 8.4–10.5)
Chloride: 102 mEq/L (ref 96–112)
Creatinine, Ser: 1.6 mg/dL — ABNORMAL HIGH (ref 0.4–1.5)
GFR: 45.7 mL/min — ABNORMAL LOW (ref >60.00)
Glucose, Bld: 89 mg/dL (ref 70–99)
Potassium: 3.6 mEq/L (ref 3.5–5.1)
Sodium: 140 mEq/L (ref 135–145)

## 2013-07-19 LAB — CBC
HCT: 37.1 % — ABNORMAL LOW (ref 39.0–52.0)
Hemoglobin: 12.5 g/dL — ABNORMAL LOW (ref 13.0–17.0)
MCHC: 33.7 g/dL (ref 30.0–36.0)
MCV: 91.5 fl (ref 78.0–100.0)
Platelets: 153 10*3/uL (ref 150.0–400.0)
RBC: 4.05 Mil/uL — ABNORMAL LOW (ref 4.22–5.81)
RDW: 13.6 % (ref 11.5–15.5)
WBC: 6.7 10*3/uL (ref 4.0–10.5)

## 2013-07-19 MED ORDER — APIXABAN 5 MG PO TABS: 5.0000 mg | ORAL_TABLET | Freq: Two times a day (BID) | ORAL | Status: DC

## 2013-07-19 NOTE — Progress Notes (Signed)
HPI:  73 year old gentleman presenting for followup evaluation. He was last seen one year ago. The patient is followed for coronary artery disease. He initially presented in 2010 with an inferior wall MI  treated with a bare-metal stent platform. He underwent repeat cardiac catheterization in 2011 that showed a 50% stenosis at the proximal edge of his right coronary artery stent. He otherwise had no obstructive disease. His left ventricular ejection fraction has been in the range of 50-55%.  The patient remains very sedentary. He does not leave his house and loss of his for a doctor's appointment or haircut. He denies bleeding problems. He denies orthopnea or PND. He does complain of bilateral leg swelling over the past several months. He is started taking Lasix but it hasn't helped much. He also complains of pain in his feet that is constant. He has some left-sided chest pain that he describes as an "ache." This is unchanged in frequency or intensity over time. The patient complains of chronic exertional dyspnea, also unchanged. He denies heart palpitations. He has cut way back on smoking. He is using electronic cigarettes.  Outpatient Encounter Prescriptions as of 07/19/2013  Medication Sig  . aspirin 81 MG tablet Take 81 mg by mouth daily.    . clopidogrel (PLAVIX) 75 MG tablet TAKE 1 TABLET BY MOUTH DAILY  . diazepam (VALIUM) 5 MG tablet Take 7.5 mg by mouth daily.    . finasteride (PROSCAR) 5 MG tablet Take 5 mg by mouth daily.   . furosemide (LASIX) 20 MG tablet Take 20 mg by mouth 2 (two) times daily.   Marland Kitchen HYDROcodone-acetaminophen (NORCO) 5-325 MG per tablet Take 1 tablet by mouth every 6 (six) hours as needed. For pain  . isosorbide mononitrate (IMDUR) 30 MG 24 hr tablet TAKE 1 TABLET BY MOUTH EVERY DAY  . lisinopril (PRINIVIL,ZESTRIL) 10 MG tablet Take 10 mg by mouth daily.  Marland Kitchen NITROSTAT 0.4 MG SL tablet PLACE ONE TABLET UNDER TONGUE AS NEEDED  . omeprazole (PRILOSEC) 20 MG capsule Take 20  mg by mouth once a week.    . risperiDONE (RISPERDAL) 0.5 MG tablet Take 0.5 mg by mouth daily.    . sertraline (ZOLOFT) 100 MG tablet Take 100 mg by mouth daily.    . Silodosin (RAPAFLO PO) Take 8 mg by mouth daily.  . simvastatin (ZOCOR) 40 MG tablet TAKE 1 TABLET BY MOUTH AT BEDTIME  . [DISCONTINUED] simvastatin (ZOCOR) 40 MG tablet TAKE 1 TABLET BY MOUTH AT BEDTIME    Allergies  Allergen Reactions  . Amitriptyline     Made me go crazy  . Gabapentin     Made me go crazy  . Paroxetine     Made me go crazy    Past Medical History  Diagnosis Date  . 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 Hypertension   . Bradycardia   . Dyslipidemia   . Tobacco abuse   . CVA (cerebral vascular accident)   . History of benign prostatic hypertrophy   . GERD (gastroesophageal reflux disease)   . Anxiety   . History of leukocytosis   . Neuropathy   . COPD (chronic obstructive pulmonary disease)   . Peripheral vascular disease   . 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 Neuropathy   . MI, old   . Foot pain   . Anxiety   . DVT (deep venous thrombosis)   . Carotid artery  occlusion     ROS: Negative except as per HPI  BP 130/78  Pulse 86  Ht 5\' 10"  (1.778 m)  Wt 91.173 kg (201 lb)  BMI 28.84 kg/m2  PHYSICAL EXAM: Pt is alert and oriented, NAD HEENT: normal Neck: JVP - normal, carotids 2+= with a right carotid bruit Lungs: CTA bilaterally CV: Irregularly irregular without murmur or gallop Abd: soft, NT, Positive BS, no hepatomegaly Ext: 2+ ankle edema and pedal edema bilaterally, pedal pulses diminished bilaterally Skin: warm/dry no rash  EKG:  Atrial fibrillation 86 beats per minute, aberrantly conducted complexes noted.  ASSESSMENT AND PLAN: 1. Atrial fibrillation, newly diagnosed. CHADS-Vasc = 5 (CAD/vascular disease, Age 27-75, HTN, and stroke). I have reviewed the pros and cons of anticoagulation. I think it is clear that his  stroke risk by far outweighs his bleeding risk and I have recommended that he start oral anticoagulation. We reviewed treatment options, including warfarin versus novel anticoagulant drugs. I have elected to start him on Eliquis 5 mg twice daily. He has very limited transportation and frequent blood monitoring with warfarin is not feasible for this gentleman. Will check a metabolic panel and CBC for baseline lab evaluation since we're starting him on an anticoagulant drug. Will also check a 2-D echocardiogram. I will arrange followup in 3 months.  2. Coronary artery disease, native vessel. The patient has a history of myocardial infarction. No anginal symptoms at present. Will discontinue antiplatelet therapy since he is starting oral anticoagulation.  3. Hyperlipidemia. The patient is on simvastatin 40 mg. His lipids are followed by Dr. Brigitte Pulse.  3. Hypertension. Blood pressure is within limits on isosorbide and lisinopril.  4. Right carotid bruit. History of remote CVA. His carotid disease is followed by vascular surgery and he has a duplex an office visit arranged for July.  5. Leg swelling/pain. Abnormal pulse exam noted. Recommend ABIs and arterial Doppler studies. Also we'll check an echocardiogram to evaluate for congestive heart failure.  Sherren Mocha 07/19/2013 9:41 AM

## 2013-07-19 NOTE — Patient Instructions (Signed)
Your physician has recommended you make the following change in your medication:  1. STOP ASPIRIN 2. STOP PLAVIX 3. START Eliquis 5mg  take one by mouth twice a day  Your physician recommends that you have lab work today: BMP and CBC  Your physician has requested that you have an echocardiogram. Echocardiography is a painless test that uses sound waves to create images of your heart. It provides your doctor with information about the size and shape of your heart and how well your heart's chambers and valves are working. This procedure takes approximately one hour. There are no restrictions for this procedure.  Your physician has requested that you have a lower extremity arterial duplex. This test is an ultrasound of the arteries in the legs. It looks at arterial blood flow in the legs. Allow one hour for Lower Arterial scans. There are no restrictions or special instructions  Your physician has requested that you have an ankle brachial index (ABI). During this test an ultrasound and blood pressure cuff are used to evaluate the arteries that supply the arms and legs with blood. Allow thirty minutes for this exam. There are no restrictions or special instructions.  Your physician recommends that you schedule a follow-up appointment in: 3 MONTHS with PA/NP  Atrial Fibrillation Atrial fibrillation is a type of irregular heart rhythm (arrhythmia). During atrial fibrillation, the upper chambers of the heart (atria) quiver continuously in a chaotic pattern. This causes an irregular and often rapid heart rate.  Atrial fibrillation is the result of the heart becoming overloaded with disorganized signals that tell it to beat. These signals are normally released one at a time by a part of the right atrium called the sinoatrial node. They then travel from the atria to the lower chambers of the heart (ventricles), causing the atria and ventricles to contract and pump blood as they pass. In atrial fibrillation,  parts of the atria outside of the sinoatrial node also release these signals. This results in two problems. First, the atria receive so many signals that they do not have time to fully contract. Second, the ventricles, which can only receive one signal at a time, beat irregularly and out of rhythm with the atria.  There are three types of atrial fibrillation:   Paroxysmal Paroxysmal atrial fibrillation starts suddenly and stops on its own within a week.   Persistent Persistent atrial fibrillation lasts for more than a week. It may stop on its own or with treatment.   Permanent Permanent atrial fibrillation does not go away. Episodes of atrial fibrillation may lead to permanent atrial fibrillation.  Atrial fibrillation can prevent your heart from pumping blood normally. It increases your risk of stroke and can lead to heart failure.  CAUSES   Heart conditions, including a heart attack, heart failure, coronary artery disease, and heart valve conditions.   Inflammation of the sac that surrounds the heart (pericarditis).   Blockage of an artery in the lungs (pulmonary embolism).   Pneumonia or other infections.   Chronic lung disease.   Thyroid problems, especially if the thyroid is overactive (hyperthyroidism).   Caffeine, excessive alcohol use, and use of some illegal drugs.   Use of some medications, including certain decongestants and diet pills.   Heart surgery.   Birth defects.  Sometimes, no cause can be found. When this happens, the atrial fibrillation is called lone atrial fibrillation. The risk of complications from atrial fibrillation increases if you have lone atrial fibrillation and you are age 60 years  or older. RISK FACTORS  Heart failure.  Coronary artery disease  Diabetes mellitus.   High blood pressure (hypertension).   Obesity.   Other arrhythmias.   Increased age. SYMPTOMS   A feeling that your heart is beating rapidly or irregularly.    A feeling of discomfort or pain in your chest.   Shortness of breath.   Sudden lightheadedness or weakness.   Getting tired easily when exercising.   Urinating more often than normal (mainly when atrial fibrillation first begins).  In paroxysmal atrial fibrillation, symptoms may start and suddenly stop. DIAGNOSIS  Your caregiver may be able to detect atrial fibrillation when taking your pulse. Usually, testing is needed to diagnosis atrial fibrillation. Tests may include:   Electrocardiography. During this test, the electrical impulses of your heart are recorded while you are lying down.   Echocardiography. During echocardiography, sound waves are used to evaluate how blood flows through your heart.   Stress test. There is more than one type of stress test. If a stress test is needed, ask your caregiver about which type is best for you.   Chest X-ray exam.   Blood tests.   Computed tomography (CT).  TREATMENT   Treating any underlying conditions. For example, if you have an overactive thyroid, treating the condition may correct atrial fibrillation.   Medication. Medications may be given to control a rapid heart rate or to prevent blood clots, heart failure, or a stroke.   Procedure to correct the rhythm of the heart:  Electrical cardioversion. During electrical cardioversion, a controlled, low-energy shock is delivered to the heart through your skin. If you have chest pain, very low pressure blood pressure, or sudden heart failure, this procedure may need to be done as an emergency.  Catheter ablation. During this procedure, heart tissues that send the signals that cause atrial fibrillation are destroyed.  Maze or minimaze procedure. During this surgery, thin lines of heart tissue that carry the abnormal signals are destroyed. The maze procedure is an open-heart surgery. The minimaze procedure is a minimally invasive surgery. This means that small cuts are made  to access the heart instead of a large opening.  Pulmonary venous isolation. During this surgery, tissue around the veins that carry blood from the lungs (pulmonary veins) is destroyed. This tissue is thought to carry the abnormal signals. HOME CARE INSTRUCTIONS   Take medications as directed by your caregiver.  Only take medications that your caregiver approves. Some medications can make atrial fibrillation worse or recur.  If blood thinners were prescribed by your caregiver, take them exactly as directed. Too much can cause bleeding. Too little and you will not have the needed protection against stroke and other problems.  Perform blood tests at home if directed by your caregiver.  Perform blood tests exactly as directed.   Quit smoking if you smoke.   Do not drink alcohol.   Do not drink caffeinated beverages such as coffee, soda, and some teas. You may drink decaffeinated coffee, soda, or tea.   Maintain a healthy weight. Do not use diet pills unless your caregiver approves. They may make heart problems worse.   Follow diet instructions as directed by your caregiver.   Exercise regularly as directed by your caregiver.   Keep all follow-up appointments. PREVENTION  The following substances can cause atrial fibrillation to recur:   Caffeinated beverages.   Alcohol.   Certain medications, especially those used for breathing problems.   Certain herbs and herbal medications, such as  those containing ephedra or ginseng.  Illegal drugs such as cocaine and amphetamines. Sometimes medications are given to prevent atrial fibrillation from recurring. Proper treatment of any underlying condition is also important in helping prevent recurrence.  SEEK MEDICAL CARE IF:  You notice a change in the rate, rhythm, or strength of your heartbeat.   You suddenly begin urinating more frequently.   You tire more easily when exerting yourself or exercising.  SEEK IMMEDIATE  MEDICAL CARE IF:   You develop chest pain, abdominal pain, sweating, or weakness.  You feel sick to your stomach (nauseous).  You develop shortness of breath.  You suddenly develop swollen feet and ankles.  You feel dizzy.  You face or limbs feel numb or weak.  There is a change in your vision or speech. MAKE SURE YOU:   Understand these instructions.  Will watch your condition.  Will get help right away if you are not doing well or get worse. Document Released: 02/09/2005 Document Revised: 06/06/2012 Document Reviewed: 03/22/2012 [facility] Patient Information 2014 [place].

## 2013-07-20 ENCOUNTER — Ambulatory Visit: Payer: Medicare Other | Admitting: Cardiovascular Disease

## 2013-07-21 ENCOUNTER — Telehealth: Payer: Self-pay | Admitting: Cardiovascular Disease

## 2013-07-21 NOTE — Telephone Encounter (Signed)
New message    The cost of eliquis $ 260.00 . Looking for alternative / samples .   Asking for xarelto from [employer] for a free 6 months supplies.

## 2013-07-21 NOTE — Telephone Encounter (Signed)
I spoke with the pt and made him aware that he can take the 28 day supply of Eliquis that was given to him at his office visit.  I will forward this message to Dr Burt Knack to review and give the order to switch medication.

## 2013-07-23 NOTE — Telephone Encounter (Signed)
Xarelto would be fine. thx

## 2013-07-24 DIAGNOSIS — F331 Major depressive disorder, recurrent, moderate: Secondary | ICD-10-CM | POA: Diagnosis not present

## 2013-07-26 MED ORDER — RIVAROXABAN 20 MG PO TABS: 20.0000 mg | ORAL_TABLET | Freq: Every day | ORAL | Status: DC

## 2013-07-26 NOTE — Telephone Encounter (Signed)
I spoke with the pt and he is taking the supply of Eliquis samples that we provided. I made the pt aware that when he finishes this supply then he can start Xarelto 20mg  daily with evening meal.  Rx sent to pharmacy. The pt will check with the pharmacy to determine the cost of medication.  If cost remains high the pt will contact our office for further patient assistance recommendations.

## 2013-07-26 NOTE — Telephone Encounter (Signed)
Follow up     Patient calling back to speak with nurse regarding increase in medication cost

## 2013-07-27 ENCOUNTER — Telehealth: Payer: Self-pay | Admitting: Cardiovascular Disease

## 2013-07-27 NOTE — Telephone Encounter (Signed)
New message    Patient calling back to speak with the nurse Lauren.

## 2013-07-27 NOTE — Telephone Encounter (Signed)
I spoke with the pt and Xarelto is also going to cost $260/month.  The pt needs assistance with being able to afford either Eliquis or Xarelto depending on which one we can get for the pt.  I will forward this message to Lovett Sox RN to follow-up with the pt about assistance programs. At this time the pt has a 3 week supply of Eliquis samples remaining.

## 2013-07-27 NOTE — Telephone Encounter (Signed)
I will mail patient patient assist application for eliquis through [facility], for patients with Medicare Part D this is the guideline; "Patients with Medicare Part D must have spent at least 3% of yearly household income on out-of-pocket costs for prescriptions this year." (this has been quoted to me from [facility] on average of $400.00-500.00)    For xarelto the guideline for patients with Medicare Part D are; "Some Medicare Part D patients who cannot afford their medicines, and who meet certain financial criteria, may also be eligible for assistance"

## 2013-07-31 ENCOUNTER — Other Ambulatory Visit (HOSPITAL_COMMUNITY): Payer: Self-pay | Admitting: Cardiology

## 2013-07-31 DIAGNOSIS — M79606 Pain in leg, unspecified: Secondary | ICD-10-CM

## 2013-08-02 ENCOUNTER — Encounter (HOSPITAL_COMMUNITY): Payer: Medicare Other

## 2013-08-02 ENCOUNTER — Ambulatory Visit (HOSPITAL_COMMUNITY): Payer: Medicare Other | Attending: Cardiology | Admitting: Cardiology

## 2013-08-02 ENCOUNTER — Other Ambulatory Visit (HOSPITAL_COMMUNITY): Payer: Self-pay | Admitting: Cardiology

## 2013-08-02 DIAGNOSIS — I739 Peripheral vascular disease, unspecified: Secondary | ICD-10-CM

## 2013-08-02 DIAGNOSIS — R609 Edema, unspecified: Secondary | ICD-10-CM

## 2013-08-02 DIAGNOSIS — M79606 Pain in leg, unspecified: Secondary | ICD-10-CM

## 2013-08-02 NOTE — Progress Notes (Signed)
Lower arterial doppler and duplex bilateral completed

## 2013-08-03 NOTE — Telephone Encounter (Signed)
Waiting for patient application for ELIQUIS, I have the provider portion with script completed by Dr Burt Knack, will wait for patient application before faxing this to [place].

## 2013-08-09 ENCOUNTER — Ambulatory Visit (HOSPITAL_COMMUNITY): Payer: Medicare Other | Attending: Cardiovascular Disease | Admitting: Cardiology

## 2013-08-09 DIAGNOSIS — J4489 Other specified chronic obstructive pulmonary disease: Secondary | ICD-10-CM | POA: Insufficient documentation

## 2013-08-09 DIAGNOSIS — Z8673 Personal history of transient ischemic attack (TIA), and cerebral infarction without residual deficits: Secondary | ICD-10-CM | POA: Diagnosis not present

## 2013-08-09 DIAGNOSIS — I2510 Atherosclerotic heart disease of native coronary artery without angina pectoris: Secondary | ICD-10-CM

## 2013-08-09 DIAGNOSIS — J449 Chronic obstructive pulmonary disease, unspecified: Secondary | ICD-10-CM | POA: Insufficient documentation

## 2013-08-09 DIAGNOSIS — I359 Nonrheumatic aortic valve disorder, unspecified: Secondary | ICD-10-CM | POA: Diagnosis not present

## 2013-08-09 DIAGNOSIS — E785 Hyperlipidemia, unspecified: Secondary | ICD-10-CM | POA: Diagnosis not present

## 2013-08-09 DIAGNOSIS — I4891 Unspecified atrial fibrillation: Secondary | ICD-10-CM | POA: Diagnosis not present

## 2013-08-09 DIAGNOSIS — I252 Old myocardial infarction: Secondary | ICD-10-CM | POA: Insufficient documentation

## 2013-08-09 DIAGNOSIS — I059 Rheumatic mitral valve disease, unspecified: Secondary | ICD-10-CM | POA: Insufficient documentation

## 2013-08-09 DIAGNOSIS — R079 Chest pain, unspecified: Secondary | ICD-10-CM

## 2013-08-09 NOTE — Progress Notes (Signed)
Echo performed. 

## 2013-08-15 ENCOUNTER — Other Ambulatory Visit: Payer: Self-pay | Admitting: Cardiovascular Disease

## 2013-08-15 ENCOUNTER — Telehealth: Payer: Self-pay | Admitting: Cardiovascular Disease

## 2013-08-15 MED ORDER — APIXABAN 5 MG PO TABS: 5.0000 mg | ORAL_TABLET | Freq: Two times a day (BID) | ORAL | Status: DC

## 2013-08-15 NOTE — Telephone Encounter (Signed)
New message     Pt request to talk to Lovett Sox

## 2013-08-15 NOTE — Telephone Encounter (Signed)
Patient states he mailed his patient assistance application on Saturday (eliquis), he has enough eliquis to last until Thursday hopefully we will have samples, patient states cost for eliquis is $261.00 and he is not able to afford this.

## 2013-08-16 ENCOUNTER — Telehealth: Payer: Self-pay | Admitting: Cardiovascular Disease

## 2013-08-16 NOTE — Telephone Encounter (Signed)
New message       Will pt be able to get help with cost of medication---you mailed them forms and he signed the forms and mailed them back to you.  She cannot remember the name of the medication

## 2013-08-17 ENCOUNTER — Telehealth: Payer: Self-pay | Admitting: *Deleted

## 2013-08-17 NOTE — Telephone Encounter (Signed)
Informed daughter I have received and faxed patient assistance program to [facility] for eliquis assistance, we will be getting samples of eliquis tomorrow per drug rep.

## 2013-08-17 NOTE — Telephone Encounter (Signed)
I just received the completed forms today and faxed to [employer] (eliquis), I have talked to patient on Tuesday [id number] regarding application so its too early as of yet to tell him anything, it take 4-6 weeks for a denial or approval.

## 2013-08-17 NOTE — Telephone Encounter (Signed)
Message copied by Fernande Boyden on Thu Aug 17, 2013  1:15 PM ------      Message from: Pauline Good      Created: Thu Aug 17, 2013 11:02 AM      Regarding: please call pt       Pt's daughter need to speak to you concerning some forms he sent you. Please call daughter [name] [phone]. ------

## 2013-08-18 ENCOUNTER — Telehealth: Payer: Self-pay

## 2013-08-18 NOTE — Telephone Encounter (Signed)
called patient to pick up samples of eliquis, placed samples up front 

## 2013-08-22 ENCOUNTER — Telehealth: Payer: Self-pay | Admitting: Cardiovascular Disease

## 2013-08-22 NOTE — Telephone Encounter (Signed)
New message          Pt calling about test results

## 2013-08-22 NOTE — Telephone Encounter (Signed)
Will forward this message to Dr Burt Knack to review the pt's vascular studies that were done in June and determine follow-up if needed.

## 2013-08-24 ENCOUNTER — Telehealth: Payer: Self-pay | Admitting: *Deleted

## 2013-08-24 NOTE — Telephone Encounter (Signed)
Follow up    Patient calling stating someone from the office called him today.

## 2013-08-24 NOTE — Telephone Encounter (Signed)
Returned call to patient he stated he was returning call,someone called him earlier. Message sent to Lovett Sox RN.

## 2013-08-24 NOTE — Telephone Encounter (Signed)
Spoke with his daughter earlier regarding needing further information for the patient assistance program, see my prior note.

## 2013-08-24 NOTE — Telephone Encounter (Signed)
Received a letter from [employer] regarding patient assistance with eliquis, they need proof of income, I called patients daughter [name] and requested this, she will fax to me hopefully on Monday 08/28/2013.

## 2013-08-31 DIAGNOSIS — R39198 Other difficulties with micturition: Secondary | ICD-10-CM | POA: Diagnosis not present

## 2013-08-31 DIAGNOSIS — R3911 Hesitancy of micturition: Secondary | ICD-10-CM | POA: Diagnosis not present

## 2013-09-06 ENCOUNTER — Other Ambulatory Visit: Payer: Self-pay | Admitting: Cardiovascular Disease

## 2013-09-07 NOTE — Telephone Encounter (Signed)
That is fine to send to Dr Brigitte Pulse.  The pt does not have lipids on file in EP[facility] since 2012. Dr Brigitte Pulse should be checking these labs.

## 2013-09-07 NOTE — Telephone Encounter (Signed)
Should this be sent to Dr Brigitte Pulse since he manages the patients lipids? Please advise. Thanks, MI

## 2013-09-11 ENCOUNTER — Telehealth: Payer: Self-pay

## 2013-09-11 NOTE — Telephone Encounter (Signed)
Patient's daughter called to get samples of eliquis placed them up front

## 2013-09-12 ENCOUNTER — Encounter: Payer: Self-pay | Admitting: Vascular Surgery

## 2013-09-13 ENCOUNTER — Ambulatory Visit (INDEPENDENT_AMBULATORY_CARE_PROVIDER_SITE_OTHER): Payer: Medicare Other | Admitting: Family

## 2013-09-13 ENCOUNTER — Ambulatory Visit (HOSPITAL_COMMUNITY)
Admission: RE | Admit: 2013-09-13 | Discharge: 2013-09-13 | Disposition: A | Discharge status: Home / Self Care | Payer: Medicare Other | Source: Ambulatory Visit | Attending: Family | Admitting: Family

## 2013-09-13 ENCOUNTER — Encounter: Payer: Self-pay | Admitting: Family

## 2013-09-13 VITALS — BP 142/81 | HR 75 | Resp 14 | Ht 70.0 in | Wt 200.0 lb

## 2013-09-13 DIAGNOSIS — I6529 Occlusion and stenosis of unspecified carotid artery: Secondary | ICD-10-CM | POA: Diagnosis not present

## 2013-09-13 DIAGNOSIS — M7989 Other specified soft tissue disorders: Secondary | ICD-10-CM | POA: Diagnosis not present

## 2013-09-13 DIAGNOSIS — Z48812 Encounter for surgical aftercare following surgery on the circulatory system: Secondary | ICD-10-CM

## 2013-09-13 NOTE — Progress Notes (Signed)
[facility] [place] HISTORY AND PHYSICAL   MRN : [id number]  History of Present Illness:   [name] is a 73 y.o. male patient of Dr. Oneida Alar who returns today for carotid artery surveillance. 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 He also has residual dysphagia, right facial droop, denies known monocular vision loss, denies much difficulty with expressive aphasia.  He stopped smoking in 2014. He is on Aspirin and Plavix for antiplatelet therapy. His atherosclerotic risk factors remain elevated cholesterol, hypertension, smoking, and coronary artery disease. These are all currently stable and followed by his primary care physician and his cardiolgist. He is trying to quit smoking and is currently on electronic cigarettes which he has weaned down to 12 mcg. He denies any new neurologic events including amaurosis, numbness, or weakness. He has also previously had a right external iliac stent and left common iliac stent in 2011. He denies claudication symptoms. 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  He had ABI's and dopplers at Dr. Honor Junes office on 08/02/13 which are abnormal. Dr. Burt Knack indicated that he would be inclined to manage him medically as he has very limited functional capacity.  Pt states he does not walk much due to pain in feet, states he has swelling in ankles and lower legs, denies non healing wounds. He was started on coumadin after his stroke, then to Plavix and ASA, the started Eliquis last month and stopped the Plavix, per pt, managed by his cardiologist. He takes a daily statin.  Pt Diabetic: No Pt smoker: former smoker, quit in 2014  Current Outpatient Prescriptions  Medication Sig Dispense Refill  . apixaban (ELIQUIS) 5 MG TABS tablet Take 1 tablet (5 mg total) by mouth 2 (two) times daily.  60 tablet  0  . diazepam (VALIUM) 5 MG tablet Take 7.5  mg by mouth daily.        . finasteride (PROSCAR) 5 MG tablet Take 5 mg by mouth daily.       . furosemide (LASIX) 20 MG tablet Take 20 mg by mouth 2 (two) times daily.       Marland Kitchen HYDROcodone-acetaminophen (NORCO) 5-325 MG per tablet Take 1 tablet by mouth every 6 (six) hours as needed. For pain      . isosorbide mononitrate (IMDUR) 30 MG 24 hr tablet TAKE 1 TABLET BY MOUTH EVERY DAY  30 tablet  0  . lisinopril (PRINIVIL,ZESTRIL) 10 MG tablet Take 10 mg by mouth daily.      Marland Kitchen NITROSTAT 0.4 MG SL tablet PLACE ONE TABLET UNDER TONGUE AS NEEDED  25 tablet  1  . omeprazole (PRILOSEC) 20 MG capsule Take 20 mg by mouth once a week.        . risperiDONE (RISPERDAL) 0.5 MG tablet Take 0.5 mg by mouth daily.        . rivaroxaban (XARELTO) 20 MG TABS tablet Take 1 tablet (20 mg total) by mouth daily with supper.  30 tablet  6  . sertraline (ZOLOFT) 100 MG tablet Take 100 mg by mouth daily.        . Silodosin (RAPAFLO PO) Take 8 mg by mouth daily.      . simvastatin (ZOCOR) 40 MG tablet TAKE 1 TABLET BY MOUTH AT BEDTIME  30 tablet  5   No current facility-administered medications for this visit.   Past Medical History  Diagnosis Date  .  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 Hypertension   . Bradycardia   . Dyslipidemia   . Tobacco abuse   . CVA (cerebral vascular accident)   . History of benign prostatic hypertrophy   . GERD (gastroesophageal reflux disease)   . Anxiety   . History of leukocytosis   . Neuropathy   . COPD (chronic obstructive pulmonary disease)   . Peripheral vascular disease   . 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 Neuropathy   . MI, old   . Foot pain   . Anxiety   . DVT (deep venous thrombosis)   . Carotid artery occlusion     Past Surgical History  Procedure Laterality Date  . Intervention of the rca      using a single BMS  . Ankle surgery    . Bypass graft  2009 or  2010    stent  . Cholecystectomy      Gall  Bladder    Social History History  Substance Use Topics  . Smoking status: Former Smoker -- 1.00 packs/day    Types: Cigarettes    Start date: 07/20/2012  . Smokeless tobacco: Former Systems developer  . Alcohol Use: No    Family History Family History  Problem Relation Age of Onset  . Stroke Mother   . Hypertension Mother   . Heart attack Brother   . Cancer Brother   . Heart disease Brother     AAA  . Hypertension Brother   . Heart disease Brother    Allergies  Allergen Reactions  . Amitriptyline     Made me go crazy  . Gabapentin     Made me go crazy  . Paroxetine     Made me go crazy     REVIEW OF SYSTEMS: See HPI for pertinent positives and negatives.  Physical Examination Filed Vitals:   09/13/13 1049 09/13/13 1053  BP: 132/82 142/81  Pulse: 55 75  Resp:  14  Height:  5\' 10"  (1.778 m)  Weight:  200 lb (90.719 kg)  SpO2:  98%   Body mass index is 28.7 kg/(m^2).  General:  WDWN in NAD Gait: Normal HENT: WNL Eyes: Pupils equal Pulmonary: normal non-labored breathing , without Rales, rhonchi,  wheezing Cardiac: RRR, no murmurs detected  Abdomen: soft, NT, no masses Skin: no rashes, ulcers noted;  no Gangrene , no cellulitis; no open wounds;   VASCULAR EXAM  Carotid Bruits Left Right   Negative Negative                             VASCULAR EXAM: Extremities without ischemic changes  without Gangrene; without open wounds; 2-3+ pitting edema in both ankles and lower legs with mild venous insufficiancy.                                                                                                          LE Pulses LEFT RIGHT  POPLITEAL  not palpable   not palpable       POSTERIOR TIBIAL  2+ palpable   not palpable        DORSALIS PEDIS      ANTERIOR TIBIAL not palpable  not palpable      Musculoskeletal: no muscle wasting or atrophy; no edema  Neurologic: A&O X 3; somewhat flat Affect ;  SENSATION: normal; MOTOR FUNCTION: 4/5 Symmetric, CN  2-12 intact except for right facial droop Speech is fluent/normal   Significant Diagnostic Studies (08/02/13 from Dr. Honor Junes office): ABI's: Right: 0.77, biphasic waveforms, TBI: 0.45 Left: 0.86, monophasic waveforms,TBI: 0.46   Non-Invasive Vascular Imaging (09/13/2013): CEREBROVASCULAR DUPLEX EVALUATION    INDICATION: Carotid disease    PREVIOUS INTERVENTION(S):     DUPLEX EXAM:     RIGHT  LEFT  Peak Systolic Velocities (cm/s) End Diastolic Velocities (cm/s) Plaque LOCATION Peak Systolic Velocities (cm/s) End Diastolic Velocities (cm/s) Plaque  59 5  CCA PROXIMAL 74 15   49 6 HT CCA MID 79 13 HT  40 7 HT CCA DISTAL 72 22 HT  103 5 HT ECA 119 9 HT  59 22 HT ICA PROXIMAL 177 56 HT  60 24  ICA MID 116 27   52 15  ICA DISTAL 53 19     1.48 ICA / CCA Ratio (PSV) 2.46  Antegrade Vertebral Flow Antegrade  [id number] Brachial Systolic Pressure (mmHg) [id number]  Multiphasic (subclavian artery) Brachial Artery Waveforms Multiphasic (subclavian artery)    Plaque Morphology:  HM = Homogeneous, HT = Heterogeneous, CP = Calcific Plaque, SP = Smooth Plaque, IP = Irregular Plaque     ADDITIONAL FINDINGS: No significant stenosis of the bilateral external or common carotid arteries.    IMPRESSION: Doppler velocities suggest a less than 40% stenosis of the right proximal internal carotid artery and a 40-59% stenosis of the left proximal internal carotid artery.    Compared to the previous exam:  No significant change in the bilateral internal carotid artery velocities noted when compared to the previous exam on 03/16/13.         ASSESSMENT:  ESSIAH PASSARIELLO is a 73 y.o. 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 He also has residual dysphagia, right facial droop, denies known monocular vision loss, denies much difficulty with expressive aphasia.  He is also s/p right external iliac stent and left common iliac  stent in 2011. He denies claudication symptoms. 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 He stopped smoking in 2014. Today's Doppler velocities suggest a less than 40% stenosis of the right proximal internal carotid artery and a 40-59% stenosis of the left proximal internal carotid artery. ABI's last month indicate moderate arterial occlusive disease in the right leg and mild in the left. He has no ischemic changes in his LE's, but does have 2-3+ pitting edema in both ankles and lower legs with mild venous insufficiency. Conservative management of mild and moderate PAD.  PLAN:   Based on today's exam and non-invasive vascular lab results, the patient will follow up in 1 year with the following tests: carotid Duplex and ABI's . Since he does not walk much due to pain in his feet, advised and demonstrated leg and arm exercises while seated several times/day, since it appears that he will not be able to participate in a graduated walking program. For venous insufficiency and LE edema: knee high compression  hose during the day, leg elevation when not walking or exercising.   I discussed in depth with the patient the nature of atherosclerosis, and emphasized the importance of maximal medical management including strict control of blood pressure, blood glucose, and lipid levels, obtaining regular exercise, and cessation of smoking.  The patient is aware that without maximal medical management the underlying atherosclerotic disease process will progress, limiting the benefit of any interventions.  The patient was given information about stroke prevention and what symptoms should prompt the patient to seek immediate medical care.  The patient was given information about PAD including signs, symptoms, treatment, what symptoms should prompt the patient to seek immediate medical care, and risk reduction measures to take. Thank you for allowing [place] to participate in this patient's care.  Clemon Chambers, RN, MSN, FNP-C [facility] Office: [phone]  Clinic MD: Scot Dock 09/13/2013 9:36 AM

## 2013-09-13 NOTE — Patient Instructions (Signed)
Stroke Prevention Some medical conditions and behaviors are associated with an increased chance of having a stroke. You may prevent a stroke by making healthy choices and managing medical conditions. HOW CAN I REDUCE MY RISK OF HAVING A STROKE?   Stay physically active. Get at least 30 minutes of activity on most or all days.  Do not smoke. It may also be helpful to avoid exposure to secondhand smoke.  Limit alcohol use. Moderate alcohol use is considered to be:  No more than 2 drinks per day for men.  No more than 1 drink per day for nonpregnant women.  Eat healthy foods. This involves  Eating 5 or more servings of fruits and vegetables a day.  Following a diet that addresses high blood pressure (hypertension), high cholesterol, diabetes, or obesity.  Manage your cholesterol levels.  A diet low in saturated fat, trans fat, and cholesterol and high in fiber may control cholesterol levels.  Take any prescribed medicines to control cholesterol as directed by your health care provider.  Manage your diabetes.  A controlled-carbohydrate, controlled-sugar diet is recommended to manage diabetes.  Take any prescribed medicines to control diabetes as directed by your health care provider.  Control your hypertension.  A low-salt (sodium), low-saturated fat, low-trans fat, and low-cholesterol diet is recommended to manage hypertension.  Take any prescribed medicines to control hypertension as directed by your health care provider.  Maintain a healthy weight.  A reduced-calorie, low-sodium, low-saturated fat, low-trans fat, low-cholesterol diet is recommended to manage weight.  Stop drug abuse.  Avoid taking birth control pills.  Talk to your health care provider about the risks of taking birth control pills if you are over 35 years old, smoke, get migraines, or have ever had a blood clot.  Get evaluated for sleep disorders (sleep apnea).  Talk to your health care provider about  getting a sleep evaluation if you snore a lot or have excessive sleepiness.  Take medicines as directed by your health care provider.  For some people, aspirin or blood thinners (anticoagulants) are helpful in reducing the risk of forming abnormal blood clots that can lead to stroke. If you have the irregular heart rhythm of atrial fibrillation, you should be on a blood thinner unless there is a good reason you cannot take them.  Understand all your medicine instructions.  Make sure that other other conditions (such as anemia or atherosclerosis) are addressed. SEEK IMMEDIATE MEDICAL CARE IF:   You have sudden weakness or numbness of the face, arm, or leg, especially on one side of the body.  Your face or eyelid droops to one side.  You have sudden confusion.  You have trouble speaking (aphasia) or understanding.  You have sudden trouble seeing in one or both eyes.  You have sudden trouble walking.  You have dizziness.  You have a loss of balance or coordination.  You have a sudden, severe headache with no known cause.  You have new chest pain or an irregular heartbeat. Any of these symptoms may represent a serious problem that is an emergency. Do not wait to see if the symptoms will go away. Get medical help at once. Call your local emergency services  (911 in [place] Do not drive yourself to the hospital. Document Released: 03/19/2004 Document Revised: 11/30/2012 Document Reviewed: 08/12/2012 [employer] Patient Information 2015 [employer]. This information is not intended to replace advice given to you by your health care provider. Make sure you discuss any questions you have with your health   care provider.   Peripheral Vascular Disease Peripheral Vascular Disease (PVD), also called Peripheral Arterial Disease (PAD), is a circulation problem caused by cholesterol (atherosclerotic plaque) deposits in the arteries. PVD commonly occurs in the lower extremities (legs) but it can  occur in other areas of the body, such as your arms. The cholesterol buildup in the arteries reduces blood flow which can cause pain and other serious problems. The presence of PVD can place a person at risk for Coronary Artery Disease (CAD).  CAUSES  Causes of PVD can be many. It is usually associated with more than one risk factor such as:   High Cholesterol.  Smoking.  Diabetes.  Lack of exercise or inactivity.  High blood pressure (hypertension).  Obesity.  Family history. SYMPTOMS   When the lower extremities are affected, patients with PVD may experience:  Leg pain with exertion or physical activity. This is called INTERMITTENT CLAUDICATION. This may present as cramping or numbness with physical activity. The location of the pain is associated with the level of blockage. For example, blockage at the abdominal level (distal abdominal aorta) may result in buttock or hip pain. Lower leg arterial blockage may result in calf pain.  As PVD becomes more severe, pain can develop with less physical activity.  In people with severe PVD, leg pain may occur at rest.  Other PVD signs and symptoms:  Leg numbness or weakness.  Coldness in the affected leg or foot, especially when compared to the other leg.  A change in leg color.  Patients with significant PVD are more prone to ulcers or sores on toes, feet or legs. These may take longer to heal or may reoccur. The ulcers or sores can become infected.  If signs and symptoms of PVD are ignored, gangrene may occur. This can result in the loss of toes or loss of an entire limb.  Not all leg pain is related to PVD. Other medical conditions can cause leg pain such as:  Blood clots (embolism) or Deep Vein Thrombosis.  Inflammation of the blood vessels (vasculitis).  Spinal stenosis. DIAGNOSIS  Diagnosis of PVD can involve several different types of tests. These can include:  Pulse Volume Recording Method (PVR). This test is simple,  painless and does not involve the use of X-rays. PVR involves measuring and comparing the blood pressure in the arms and legs. An ABI (Ankle-Brachial Index) is calculated. The normal ratio of blood pressures is 1. As this number becomes smaller, it indicates more severe disease.  < 0.95 - indicates significant narrowing in one or more leg vessels.  <0.8 - there will usually be pain in the foot, leg or buttock with exercise.  <0.4 - will usually have pain in the legs at rest.  <0.25 - usually indicates limb threatening PVD.  Doppler detection of pulses in the legs. This test is painless and checks to see if you have a pulses in your legs/feet.  A dye or contrast material (a substance that highlights the blood vessels so they show up on x-ray) may be given to help your caregiver better see the arteries for the following tests. The dye is eliminated from your body by the kidney's. Your caregiver may order blood work to check your kidney function and other laboratory values before the following tests are performed:  Magnetic Resonance Angiography (MRA). An MRA is a picture study of the blood vessels and arteries. The MRA machine uses a large magnet to produce images of the blood vessels.  Computed   Tomography Angiography (CTA). A CTA is a specialized x-ray that looks at how the blood flows in your blood vessels. An IV may be inserted into your arm so contrast dye can be injected.  Angiogram. Is a procedure that uses x-rays to look at your blood vessels. This procedure is minimally invasive, meaning a small incision (cut) is made in your groin. A small tube (catheter) is then inserted into the artery of your groin. The catheter is guided to the blood vessel or artery your caregiver wants to examine. Contrast dye is injected into the catheter. X-rays are then taken of the blood vessel or artery. After the images are obtained, the catheter is taken out. TREATMENT  Treatment of PVD involves many  interventions which may include:  Lifestyle changes:  Quitting smoking.  Exercise.  Following a low fat, low cholesterol diet.  Control of diabetes.  Foot care is very important to the PVD patient. Good foot care can help prevent infection.  Medication:  Cholesterol-lowering medicine.  Blood pressure medicine.  Anti-platelet drugs.  Certain medicines may reduce symptoms of Intermittent Claudication.  Interventional/Surgical options:  Angioplasty. An Angioplasty is a procedure that inflates a balloon in the blocked artery. This opens the blocked artery to improve blood flow.  Stent Implant. A wire mesh tube (stent) is placed in the artery. The stent expands and stays in place, allowing the artery to remain open.  Peripheral Bypass Surgery. This is a surgical procedure that reroutes the blood around a blocked artery to help improve blood flow. This type of procedure may be performed if Angioplasty or stent implants are not an option. SEEK IMMEDIATE MEDICAL CARE IF:   You develop pain or numbness in your arms or legs.  Your arm or leg turns cold, becomes blue in color.  You develop redness, warmth, swelling and pain in your arms or legs. MAKE SURE YOU:   Understand these instructions.  Will watch your condition.  Will get help right away if you are not doing well or get worse. Document Released: 03/19/2004 Document Revised: 05/04/2011 Document Reviewed: 02/14/2008 [facility] Patient Information 2015 [place], [place]. This information is not intended to replace advice given to you by your health care provider. Make sure you discuss any questions you have with your health care provider.

## 2013-09-14 ENCOUNTER — Telehealth: Payer: Self-pay | Admitting: Cardiovascular Disease

## 2013-09-14 ENCOUNTER — Other Ambulatory Visit: Payer: Self-pay | Admitting: Cardiovascular Disease

## 2013-09-14 ENCOUNTER — Other Ambulatory Visit: Payer: Self-pay

## 2013-09-14 MED ORDER — SIMVASTATIN 40 MG PO TABS: ORAL_TABLET | ORAL | Status: DC

## 2013-09-14 MED ORDER — CLOPIDOGREL BISULFATE 75 MG PO TABS: 75.0000 mg | ORAL_TABLET | Freq: Every day | ORAL | Status: DC

## 2013-09-14 NOTE — Telephone Encounter (Signed)
New message          Pt daughter would like you to go back over her fathers last test results

## 2013-09-15 NOTE — Telephone Encounter (Signed)
**Note De-Identified  Obfuscation** [name], the pts daughter, is advised that the pt's Lower Extremity Arterial Duplex showed critical limb ischemia with decreased blood flow in his legs and that Dr Burt Knack plans to manage medically for now. She verbalized understanding and thanked me for returning her call.

## 2013-09-15 NOTE — Telephone Encounter (Signed)
Follow up     Daughter want test results---test that was preformed on his legs to test for circulation.  Pt does not understand what the nurse told him about his results.

## 2013-09-18 ENCOUNTER — Telehealth: Payer: Self-pay | Admitting: Cardiovascular Disease

## 2013-09-18 NOTE — Telephone Encounter (Signed)
New message          Pt filled out for prescription assistance and was denied / [name] is out of the office , can you help with this?

## 2013-10-03 NOTE — Telephone Encounter (Signed)
I spoke with the pt and due to a third party insurance with drug coverage he does not qualify for patient assistance program. The pt has one week of samples remaining and would like more samples of Eliquis if possible.  In the past Dr Burt Knack had said the pt could switch to Xarelto due to cost of Eliquis.  Per pt Xarelto is just as expensive as Eliquis. The pt said he could not do coumadin because he does not have transportation for frequent visits.  I will forward this message to Mindy/Ross in the refill room to check the sample closet for Eliquis samples and contact the pt.

## 2013-10-03 NOTE — Telephone Encounter (Signed)
Follow up   Patient calling stating someone call him today did not leave a message.

## 2013-10-03 NOTE — Telephone Encounter (Signed)
Called patient to let him know that i would leave samples up front and I also gave him a 30 days free card

## 2013-10-03 NOTE — Telephone Encounter (Signed)
Notes Recorded by Sherren Mocha, MD on 08/30/2013 at 3:50 PM Results reviewed with patient. He physical activity is very limited and he has no signs of CLI. Would manage medically for now. Will let [facility] know as he has an upcoming appt.   Please see Dr Antionette Char comments. The pt's study did not show signs of Critical Limb ischemia.

## 2013-10-03 NOTE — Telephone Encounter (Signed)
My name is Eliot Bencivenga LOL. I will leave him samples up front and I think you can appeal this denial if you want to. Or we give him a 30 day free card, if it has been over a year since he had one. Thanks [employer]

## 2013-10-16 NOTE — Telephone Encounter (Signed)
[name] contacted the [employer] to try and obtain a Tier exception for Eliquis to lower the cost of medication.  Per [name] the pt has a $246.00 drug deductible that he must meet before the cost of Eliquis will be $30. We have placed more samples for pick-up at his upcoming appointment.

## 2013-10-18 ENCOUNTER — Ambulatory Visit (INDEPENDENT_AMBULATORY_CARE_PROVIDER_SITE_OTHER): Payer: Medicare Other | Admitting: Nurse Practitioner

## 2013-10-18 ENCOUNTER — Encounter: Payer: Self-pay | Admitting: Nurse Practitioner

## 2013-10-18 ENCOUNTER — Telehealth: Payer: Self-pay | Admitting: Cardiology

## 2013-10-18 VITALS — BP 140/80 | HR 71 | Ht 70.0 in | Wt 184.4 lb

## 2013-10-18 DIAGNOSIS — E785 Hyperlipidemia, unspecified: Secondary | ICD-10-CM

## 2013-10-18 DIAGNOSIS — I259 Chronic ischemic heart disease, unspecified: Secondary | ICD-10-CM

## 2013-10-18 DIAGNOSIS — Z79899 Other long term (current) drug therapy: Secondary | ICD-10-CM

## 2013-10-18 DIAGNOSIS — I2510 Atherosclerotic heart disease of native coronary artery without angina pectoris: Secondary | ICD-10-CM

## 2013-10-18 LAB — BASIC METABOLIC PANEL
BUN: 12 mg/dL (ref 6–23)
CO2: 30 mEq/L (ref 19–32)
Calcium: 9 mg/dL (ref 8.4–10.5)
Chloride: 99 mEq/L (ref 96–112)
Creatinine, Ser: 1.5 mg/dL (ref 0.4–1.5)
GFR: 48.49 mL/min — ABNORMAL LOW (ref >60.00)
Glucose, Bld: 88 mg/dL (ref 70–99)
Potassium: 2.9 mEq/L — ABNORMAL LOW (ref 3.5–5.1)
Sodium: 139 mEq/L (ref 135–145)

## 2013-10-18 LAB — LIPID PANEL
Cholesterol: 81 mg/dL (ref 0–200)
HDL: 32.1 mg/dL — ABNORMAL LOW (ref >39.00)
LDL Cholesterol: 33 mg/dL (ref 0–99)
NonHDL: 48.9
Total CHOL/HDL Ratio: 3
Triglycerides: 80 mg/dL (ref 0.0–149.0)
VLDL: 16 mg/dL (ref 0.0–40.0)

## 2013-10-18 LAB — HEPATIC FUNCTION PANEL
ALT: 20 U/L (ref 0–53)
AST: 23 U/L (ref 0–37)
Albumin: 3.8 g/dL (ref 3.5–5.2)
Alkaline Phosphatase: 50 U/L (ref 39–117)
Bilirubin, Direct: 0.1 mg/dL (ref 0.0–0.3)
Total Bilirubin: 1 mg/dL (ref 0.2–1.2)
Total Protein: 7 g/dL (ref 6.0–8.3)

## 2013-10-18 LAB — CBC
HCT: 39.8 % (ref 39.0–52.0)
Hemoglobin: 13.2 g/dL (ref 13.0–17.0)
MCHC: 33.2 g/dL (ref 30.0–36.0)
MCV: 88.8 fl (ref 78.0–100.0)
Platelets: 180 10*3/uL (ref 150.0–400.0)
RBC: 4.49 Mil/uL (ref 4.22–5.81)
RDW: 14.9 % (ref 11.5–15.5)
WBC: 7.2 10*3/uL (ref 4.0–10.5)

## 2013-10-18 MED ORDER — POTASSIUM CHLORIDE CRYS ER 20 MEQ PO TBCR: EXTENDED_RELEASE_TABLET | ORAL | Status: DC

## 2013-10-18 NOTE — Patient Instructions (Signed)
We will check labs today  Stay on your current medicines  See Dr. Burt Knack in 4 months  Call the [place] office at [phone] if you have any questions, problems or concerns.

## 2013-10-18 NOTE — Telephone Encounter (Signed)
Pts daughter notified, rx sent in and labs ordered.

## 2013-10-18 NOTE — Telephone Encounter (Signed)
Message copied by Alcario Drought on Wed Oct 18, 2013  4:46 PM ------      Message from: Burtis Junes      Created: Wed Oct 18, 2013  4:17 PM       Ok to report. Labs are satisfactory but his potassium is low - start Kdur 20 meq take one twice a day for 3 days, then one daily. Recheck BMET in one week (or as soon as he thinks he can get back here - he has transportation issues). ------

## 2013-10-18 NOTE — Progress Notes (Signed)
[name] Date of Birth: 1940-07-06 Medical Record [id number]  History of Present Illness: [name] is seen back today for a 3 month check. Seen for Dr. Burt Knack. He has CAD and had past inferior MI in 2010 treated with BMS. Cath in 2011 showed 50% stenosis at proximal edge of his RCA and otherwise no obstructive disease. EF 50 to 55%.  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 Oneida Alar.   Last seen here in May - was in atrial fib - Eliquis started for [facility] of 5. Echo updated and EF ok. Has seen [facility] this summer - tells me he is not going back - going to "too many doctors" - wants as much stuff done here as possible. Pretty up to date with his tests. No palpitations. No chest pain. Breathing ok. Swelling has improved but his feet still hurt - especially if he wears shoes. Remains very sedentary. Transportation an issue. Now in the doughnut hole as well. Says he is taking his medicines. No recent labs. To see PCP next month but he is not sure he will be able to go due to transportation.  Current Outpatient Prescriptions  Medication Sig Dispense Refill  . apixaban (ELIQUIS) 5 MG TABS tablet Take 1 tablet (5 mg total) by mouth 2 (two) times daily.  60 tablet  0  . diazepam (VALIUM) 5 MG tablet Take 7.5 mg by mouth daily.        . finasteride (PROSCAR) 5 MG tablet Take 5 mg by mouth daily.       . furosemide (LASIX) 20 MG tablet Take 20 mg by mouth daily.       Marland Kitchen HYDROcodone-acetaminophen (NORCO) 5-325 MG per tablet Take 1 tablet by mouth every 6 (six) hours as needed. For pain      . isosorbide mononitrate (IMDUR) 30 MG 24 hr tablet TAKE 1 TABLET BY MOUTH EVERY DAY  30 tablet  1  . lisinopril (PRINIVIL,ZESTRIL) 10 MG tablet Take 20 mg by mouth daily.       Marland Kitchen NITROSTAT 0.4 MG SL tablet PLACE ONE TABLET UNDER TONGUE AS NEEDED  25 tablet  1  .  omeprazole (PRILOSEC) 20 MG capsule Take 20 mg by mouth once a week.        . risperiDONE (RISPERDAL) 0.5 MG tablet Take 0.5 mg by mouth daily.        . sertraline (ZOLOFT) 100 MG tablet Take 100 mg by mouth daily.        . simvastatin (ZOCOR) 40 MG tablet TAKE 1 TABLET BY MOUTH AT BEDTIME  90 tablet  0   No current facility-administered medications for this visit.    Allergies  Allergen Reactions  . Amitriptyline Other (See Comments)    Made me go crazy  . Gabapentin Other (See Comments)    Made me go crazy  . Paroxetine Other (See Comments)    Made me go crazy    Past Medical History  Diagnosis Date  . 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 Hypertension   . Bradycardia   . Dyslipidemia   . Tobacco abuse   . CVA (cerebral vascular accident)   . History of benign prostatic hypertrophy   . GERD (gastroesophageal reflux disease)   . Anxiety   .  History of leukocytosis   . Neuropathy   . COPD (chronic obstructive pulmonary disease)   . Peripheral vascular disease   . 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 Neuropathy   . MI, old   . Foot pain   . Anxiety   . DVT (deep venous thrombosis)   . Carotid artery occlusion   . Atrial fibrillation     Past Surgical History  Procedure Laterality Date  . Intervention of the rca      using a single BMS  . Ankle surgery    . Bypass graft  2009 or  2010    stent  . Cholecystectomy      Gall Bladder    History  Smoking status  . Former Smoker -- 1.00 packs/day  . Types: Cigarettes  . Start date: 07/20/2012  . Quit date: 09/13/2012  Smokeless tobacco  . Former Systems developer  . Quit date: 09/13/2012    History  Alcohol Use No    Family History  Problem Relation Age of Onset  . Stroke Mother   . Hypertension Mother   . Heart attack Mother   . Heart attack Brother   . Cancer Brother   . Heart disease Brother     AAA  . Hypertension Brother   . Heart disease Brother   .  Hypertension Father   . Heart attack Father     Review of Systems: The review of systems is per the HPI.  All other systems were reviewed and are negative.  Physical Exam: BP 140/80  Pulse 71  Ht 5\' 10"  (1.778 m)  Wt 184 lb 6.4 oz (83.643 kg)  BMI 26.46 kg/m2  SpO2 97% Patient is very pleasant and in no acute distress. Skin is warm and dry. Color is normal.  HEENT is unremarkable. Normocephalic/atraumatic. PERRL. Sclera are nonicteric. Neck is supple. No masses. No JVD. Lungs are clear. Cardiac exam shows an irregular rhythm. Rate ok. Abdomen is soft. Extremities are without edema. Gait and ROM are intact. No gross neurologic deficits noted.  Wt Readings from Last 3 Encounters:  10/18/13 184 lb 6.4 oz (83.643 kg)  09/13/13 200 lb (90.719 kg)  07/19/13 201 lb (91.173 kg)    LABORATORY DATA/PROCEDURES:  Lab Results  Component Value Date   WBC 6.7 07/19/2013   HGB 12.5* 07/19/2013   HCT 37.1* 07/19/2013   PLT 153.0 07/19/2013   GLUCOSE 89 07/19/2013   CHOL  Value: 96        ATP III CLASSIFICATION:  <200     mg/dL   Desirable  200-239  mg/dL   Borderline High  >=240    mg/dL   High        03/26/2010   TRIG 121 03/26/2010   HDL 27* 03/26/2010   LDLCALC  Value: 45        Total Cholesterol/HDL:CHD Risk Coronary Heart Disease Risk Table                     Men   Women  1/2 Average Risk   3.4   3.3  Average Risk       5.0   4.4  2 X Average Risk   9.6   7.1  3 X Average Risk  23.4   11.0        Use the calculated Patient Ratio above and the CHD Risk Table to determine the patient's CHD Risk.  ATP III CLASSIFICATION (LDL):  <100     mg/dL   Optimal  100-129  mg/dL   Near or Above                    Optimal  130-159  mg/dL   Borderline  160-189  mg/dL   High  >190     mg/dL   Very High 03/26/2010   ALT 20 03/26/2010   AST 21 03/26/2010   NA 140 07/19/2013   K 3.6 07/19/2013   CL 102 07/19/2013   CREATININE 1.6* 07/19/2013   BUN 10 07/19/2013   CO2 30 07/19/2013   TSH 3.394 Test methodology is 3rd  generation TSH 04/22/2009   INR 1.15 03/25/2010   HGBA1C  Value: 5.2 (NOTE) The ADA recommends the following therapeutic goal for glycemic control related to Hgb A1c measurement: Goal of therapy: <6.5 Hgb A1c  Reference: American Diabetes Association: Clinical Practice Recommendations 2010, Diabetes Care, 2010, 33: (Suppl  1). 08/15/2008    BNP (last 3 results) No results found for this basename: PROBNP,  in the last 8760 hours  Echo Study Conclusions from June of 2015  - Left ventricle: The cavity size was normal. There was mild focal basal hypertrophy of the septum. Systolic function was normal. The estimated ejection fraction was in the range of 50% to 55%. There is akinesis of the basalinferior myocardium. - Aortic valve: There was mild regurgitation. - Mitral valve: Calcified annulus. There was mild regurgitation.   Assessment / Plan: 1. Atrial fib - managed with rate control and anticoagulation -  Samples of Eliquis given today.   2. CAD - no active chest pain  3. HTN - BP ok  4. PVD - managed conservatively - he remains quite sedentary  5. HLD - check labs today. He says he is fasting.   Overall, he seems to be holding his own. No change with his current regimen. Seen back in 4 months.   Patient is agreeable to this plan and will call if any problems develop in the interim.   Burtis Junes, RN, [place] [address] [place], [place]  [phone]

## 2013-10-26 ENCOUNTER — Other Ambulatory Visit (INDEPENDENT_AMBULATORY_CARE_PROVIDER_SITE_OTHER): Payer: Medicare Other

## 2013-10-26 DIAGNOSIS — Z79899 Other long term (current) drug therapy: Secondary | ICD-10-CM

## 2013-10-26 LAB — BASIC METABOLIC PANEL
BUN: 12 mg/dL (ref 6–23)
CO2: 29 mEq/L (ref 19–32)
Calcium: 9.9 mg/dL (ref 8.4–10.5)
Chloride: 103 mEq/L (ref 96–112)
Creatinine, Ser: 1.7 mg/dL — ABNORMAL HIGH (ref 0.4–1.5)
GFR: 41.68 mL/min — ABNORMAL LOW (ref >60.00)
Glucose, Bld: 100 mg/dL — ABNORMAL HIGH (ref 70–99)
Potassium: 3.9 mEq/L (ref 3.5–5.1)
Sodium: 140 mEq/L (ref 135–145)

## 2013-10-27 ENCOUNTER — Telehealth: Payer: Self-pay | Admitting: *Deleted

## 2013-10-27 NOTE — Telephone Encounter (Signed)
pt notified about lab results with verbal understanding  

## 2013-11-07 DIAGNOSIS — R7301 Impaired fasting glucose: Secondary | ICD-10-CM | POA: Diagnosis not present

## 2013-11-07 DIAGNOSIS — I10 Essential (primary) hypertension: Secondary | ICD-10-CM | POA: Diagnosis not present

## 2013-11-07 DIAGNOSIS — E785 Hyperlipidemia, unspecified: Secondary | ICD-10-CM | POA: Diagnosis not present

## 2013-11-07 DIAGNOSIS — Z125 Encounter for screening for malignant neoplasm of prostate: Secondary | ICD-10-CM | POA: Diagnosis not present

## 2013-11-13 DIAGNOSIS — I2510 Atherosclerotic heart disease of native coronary artery without angina pectoris: Secondary | ICD-10-CM | POA: Diagnosis not present

## 2013-11-13 DIAGNOSIS — Z7901 Long term (current) use of anticoagulants: Secondary | ICD-10-CM | POA: Diagnosis not present

## 2013-11-13 DIAGNOSIS — Z1212 Encounter for screening for malignant neoplasm of rectum: Secondary | ICD-10-CM | POA: Diagnosis not present

## 2013-11-13 DIAGNOSIS — Z1331 Encounter for screening for depression: Secondary | ICD-10-CM | POA: Diagnosis not present

## 2013-11-13 DIAGNOSIS — J449 Chronic obstructive pulmonary disease, unspecified: Secondary | ICD-10-CM | POA: Diagnosis not present

## 2013-11-13 DIAGNOSIS — R7301 Impaired fasting glucose: Secondary | ICD-10-CM | POA: Diagnosis not present

## 2013-11-13 DIAGNOSIS — Z23 Encounter for immunization: Secondary | ICD-10-CM | POA: Diagnosis not present

## 2013-11-13 DIAGNOSIS — I10 Essential (primary) hypertension: Secondary | ICD-10-CM | POA: Diagnosis not present

## 2013-11-13 DIAGNOSIS — E785 Hyperlipidemia, unspecified: Secondary | ICD-10-CM | POA: Diagnosis not present

## 2013-12-01 ENCOUNTER — Encounter: Payer: Self-pay | Admitting: Cardiovascular Disease

## 2013-12-12 ENCOUNTER — Other Ambulatory Visit: Payer: Self-pay | Admitting: Cardiovascular Disease

## 2013-12-19 ENCOUNTER — Other Ambulatory Visit: Payer: Self-pay

## 2013-12-19 NOTE — Telephone Encounter (Signed)
Patient 's daughter call to get samples of eliquis 5 mg samples were placed upfront

## 2014-01-01 DIAGNOSIS — N400 Enlarged prostate without lower urinary tract symptoms: Secondary | ICD-10-CM | POA: Diagnosis not present

## 2014-02-02 ENCOUNTER — Encounter: Payer: Self-pay | Admitting: Physician Assistant

## 2014-02-05 ENCOUNTER — Encounter: Payer: Self-pay | Admitting: Gastroenterology

## 2014-02-07 ENCOUNTER — Ambulatory Visit: Payer: Medicare Other | Admitting: Physician Assistant

## 2014-02-07 ENCOUNTER — Telehealth: Payer: Self-pay | Admitting: Nurse Practitioner

## 2014-02-07 NOTE — Telephone Encounter (Signed)
New Msg    Pt states he was called today? Returning call.

## 2014-02-07 NOTE — Telephone Encounter (Signed)
No answer no vm

## 2014-02-08 ENCOUNTER — Ambulatory Visit (INDEPENDENT_AMBULATORY_CARE_PROVIDER_SITE_OTHER): Payer: Medicare Other | Admitting: Physician Assistant

## 2014-02-08 ENCOUNTER — Telehealth: Payer: Self-pay | Admitting: Gastroenterology

## 2014-02-08 ENCOUNTER — Other Ambulatory Visit (INDEPENDENT_AMBULATORY_CARE_PROVIDER_SITE_OTHER): Payer: Medicare Other

## 2014-02-08 ENCOUNTER — Encounter: Payer: Self-pay | Admitting: Physician Assistant

## 2014-02-08 VITALS — BP 124/78 | HR 72 | Ht 69.5 in | Wt 193.0 lb

## 2014-02-08 DIAGNOSIS — K5900 Constipation, unspecified: Secondary | ICD-10-CM

## 2014-02-08 DIAGNOSIS — I2510 Atherosclerotic heart disease of native coronary artery without angina pectoris: Secondary | ICD-10-CM

## 2014-02-08 DIAGNOSIS — K625 Hemorrhage of anus and rectum: Secondary | ICD-10-CM | POA: Diagnosis not present

## 2014-02-08 DIAGNOSIS — R195 Other fecal abnormalities: Secondary | ICD-10-CM

## 2014-02-08 DIAGNOSIS — R1314 Dysphagia, pharyngoesophageal phase: Secondary | ICD-10-CM

## 2014-02-08 LAB — CBC WITH DIFFERENTIAL/PLATELET
Basophils Absolute: 0 10*3/uL (ref 0.0–0.1)
Basophils Relative: 0.4 % (ref 0.0–3.0)
Eosinophils Absolute: 0.1 10*3/uL (ref 0.0–0.7)
Eosinophils Relative: 2.2 % (ref 0.0–5.0)
HCT: 39.8 % (ref 39.0–52.0)
Hemoglobin: 13.2 g/dL (ref 13.0–17.0)
Lymphocytes Relative: 26.6 % (ref 12.0–46.0)
Lymphs Abs: 1.7 10*3/uL (ref 0.7–4.0)
MCHC: 33 g/dL (ref 30.0–36.0)
MCV: 96.2 fl (ref 78.0–100.0)
Monocytes Absolute: 0.6 10*3/uL (ref 0.1–1.0)
Monocytes Relative: 9.6 % (ref 3.0–12.0)
Neutro Abs: 3.8 10*3/uL (ref 1.4–7.7)
Neutrophils Relative %: 61.2 % (ref 43.0–77.0)
Platelets: 159 10*3/uL (ref 150.0–400.0)
RBC: 4.14 Mil/uL — ABNORMAL LOW (ref 4.22–5.81)
RDW: 16 % — ABNORMAL HIGH (ref 11.5–15.5)
WBC: 6.2 10*3/uL (ref 4.0–10.5)

## 2014-02-08 LAB — FERRITIN: Ferritin: 60.4 ng/mL (ref 22.0–322.0)

## 2014-02-08 LAB — IBC PANEL
Iron: 74 ug/dL (ref 42–165)
Saturation Ratios: 23.9 % (ref 20.0–50.0)
Transferrin: 221.5 mg/dL (ref 212.0–360.0)

## 2014-02-08 NOTE — Patient Instructions (Signed)
You will be contaced by our office prior to your procedure for directions on holding your Eliquis.  If you do not hear from our office 1 week prior to your scheduled procedure, please call [phone] to discuss.  Your physician has requested that you go to the basement for  lab work before leaving today.  You have been scheduled for a Barium Esophogram at [facility] (1st floor of the hospital) on 02/13/14 at 2:00 pm. Please arrive 15 minutes prior to your appointment for registration. Make certain not to have anything to eat or drink 6 hours prior to your test. If you need to reschedule for any reason, please contact radiology at [phone] to do so. __________________________________________________________________ A barium swallow is an examination that concentrates on views of the esophagus. This tends to be a double contrast exam (barium and two liquids which, when combined, create a gas to distend the wall of the oesophagus) or single contrast (non-ionic iodine based). The study is usually tailored to your symptoms so a good history is essential. Attention is paid during the study to the form, structure and configuration of the esophagus, looking for functional disorders (such as aspiration, dysphagia, achalasia, motility and reflux) EXAMINATION You may be asked to change into a gown, depending on the type of swallow being performed. A radiologist and radiographer will perform the procedure. The radiologist will advise you of the type of contrast selected for your procedure and direct you during the exam. You will be asked to stand, sit or lie in several different positions and to hold a small amount of fluid in your mouth before being asked to swallow while the imaging is performed .In some instances you may be asked to swallow barium coated marshmallows to assess the motility of a solid food bolus. The exam can be recorded as a digital or video fluoroscopy procedure. POST  PROCEDURE It will take 1-2 days for the barium to pass through your system. To facilitate this, it is important, unless otherwise directed, to increase your fluids for the next 24-48hrs and to resume your normal diet.  This test typically takes about 30 minutes to perform. _________________________________________________________________________________    [name] have been scheduled for an endoscopy. Please follow written instructions given to you at your visit today. If you use inhalers (even only as needed), please bring them with you on the day of your procedure. Your physician has requested that you go to www.startemmi.com and enter the access code given to you at your visit today. This web site gives a general overview about your procedure. However, you should still follow specific instructions given to you by our office regarding your preparation for the procedure.  please purchase Miralax OTC and take 1 capfull in water or juice every day and titrate up or down to achieve a bowel movement

## 2014-02-08 NOTE — Telephone Encounter (Signed)
02/08/2014   RE: BARNETT PRIDEAUX DOB: December 03, 1940 MRN: [id number]   Dear Dr. Burt Knack,    We have scheduled the above patient for an endoscopic procedure. Our records show that he is on anticoagulation therapy.   Please advise as to how long the patient may come off his therapy of Eliquis 2 days prior to the Endoscopy procedure, which is scheduled for 03/13/14.  Please fax back/ or route the completed form to Sejal Cofield at [phone].   Sincerely,    Laquashia Mergenthaler A.

## 2014-02-08 NOTE — Progress Notes (Addendum)
Patient ID: [name], male   DOB: Apr 07, 1940, 73 y.o.   MRN: [id number]    HPI:   [name] is a 73 year old male referred for evaluation by Dr. Clelia [name] due to recent findings of heme positive stools.  [name] has a history of coronary artery disease and had a past inferior MI in 2010 treated with BMS. Catheterization in 2011 showed 50% stenosis at proximal edge of his RCA and otherwise no obstructive disease. EF 50-55%. 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 He has a history of atrial fibrillation and is on Eliquis. [name] was recently seen by Dr. Clelia [name] for a routine physical and was found to have heme-positive stools. He has had no bright red blood per rectum or melena. He has had no change in his bowel habits or stool caliber, however he states he goes 1-1-1/2 weeks between bowel movements and then strains to have a bowel movement he has no hard stools. He has no abdominal pain. He has no nausea or vomiting. He has lost 10 pounds over the past 6 months. He does report that he has had some dysphagia to solids. He reports that he had a food impaction for years ago and Dr. Elnoria [name] did an EGD at the hospital and removed a food bolus. He also says he had a colonoscopy by Dr. Ewing [name]  for years ago which was normal he does not know why he had a colonoscopy performed.   Past Medical History  Diagnosis Date  . 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 Hypertension   . Bradycardia   . Dyslipidemia   . Tobacco abuse   . CVA (cerebral vascular accident)   . History of benign prostatic hypertrophy   . GERD (gastroesophageal reflux disease)   . Anxiety   . History of leukocytosis   . Neuropathy   . COPD (chronic obstructive pulmonary disease)   . Peripheral vascular disease   . Renal  failure, chronic     stage 111  . MI, old   . Foot pain   . DVT (deep venous thrombosis)   . Carotid artery occlusion   . Atrial fibrillation   . Depression   . Colon polyps   . Common bile duct stone     Past Surgical History  Procedure Laterality Date  . Intervention of the rca      using a single BMS  . Ankle surgery    . Bypass graft  2009 or  2010    stent  . Cholecystectomy      Gall Bladder   Family History  Problem Relation Age of Onset  . Stroke Mother   . Hypertension Mother   . Heart attack Mother   . Heart attack Brother   . Cancer Brother   . Heart disease Brother     AAA  . Hypertension Brother   . Heart disease Brother   . Hypertension Father   . Heart attack Father    History  Substance Use Topics  . Smoking status: Former Smoker -- 1.00 packs/day    Types: Cigarettes    Start date: 07/20/2012    Quit date: 09/13/2012  . Smokeless tobacco: Former Neurosurgeon    Quit date: 09/13/2012  . Alcohol Use: No   Current Outpatient Prescriptions  Medication Sig  Dispense Refill  . apixaban (ELIQUIS) 5 MG TABS tablet Take 1 tablet (5 mg total) by mouth 2 (two) times daily. 60 tablet 0  . diazepam (VALIUM) 5 MG tablet Take 7.5 mg by mouth daily.      . finasteride (PROSCAR) 5 MG tablet Take 5 mg by mouth daily.     . furosemide (LASIX) 20 MG tablet Take 20 mg by mouth daily.     Marland Kitchen HYDROcodone-acetaminophen (NORCO) 5-325 MG per tablet Take 1 tablet by mouth every 6 (six) hours as needed. For pain    . isosorbide mononitrate (IMDUR) 30 MG 24 hr tablet TAKE 1 TABLET BY MOUTH EVERY DAY 30 tablet 3  . lisinopril (PRINIVIL,ZESTRIL) 10 MG tablet Take 20 mg by mouth daily.     Marland Kitchen NITROSTAT 0.4 MG SL tablet PLACE ONE TABLET UNDER TONGUE AS NEEDED 25 tablet 1  . omeprazole (PRILOSEC) 20 MG capsule Take 20 mg by mouth once a week.      . potassium chloride SA (KLOR-CON M20) 20 MEQ tablet 1 tablet twice a day for 3 days and then 1 tablet daily 30 tablet 4  . risperiDONE  (RISPERDAL) 0.5 MG tablet Take 0.5 mg by mouth daily.      . sertraline (ZOLOFT) 100 MG tablet Take 100 mg by mouth daily.      . simvastatin (ZOCOR) 40 MG tablet TAKE 1 TABLET BY MOUTH AT BEDTIME 90 tablet 0   No current facility-administered medications for this visit.   Allergies  Allergen Reactions  . Hctz [Hydrochlorothiazide] Anaphylaxis and Other (See Comments)    Throat Swelling  . Amitriptyline Other (See Comments)    Made me go crazy  . Gabapentin Other (See Comments)    Made me go crazy  . Metoprolol Tartrate Other (See Comments)    fatigue  . Paroxetine Other (See Comments)    Made me go crazy  . Spiriva Handihaler [Tiotropium Bromide Monohydrate] Other (See Comments)    Urinary retention     Review of Systems: Gen: Denies any fever, chills, sweats, anorexia, fatigue, weakness, malaise, weight loss, and sleep disorder CV: Denies chest pain, angina, palpitations, syncope, orthopnea, PND, peripheral edema, and claudication. Resp: Denies dyspnea at rest, dyspnea with exercise, cough, sputum, wheezing, coughing up blood, and pleurisy. GI: Denies vomiting blood, jaundice, and fecal incontinence.  Has dysphagia to solids x several months. GU : Denies urinary burning, blood in urine, urinary frequency, urinary hesitancy, nocturnal urination, and urinary incontinence. MS: Denies joint pain, limitation of movement, and swelling, stiffness, low back pain, extremity pain. Denies muscle weakness, cramps, atrophy.  Derm: Denies rash, itching, dry skin, hives, moles, warts, or unhealing ulcers.  Psych: Denies depression, anxiety, memory loss, suicidal ideation, hallucinations, paranoia, and confusion. Heme: Denies bruising, bleeding, and enlarged lymph nodes. Neuro:  Denies any headaches, dizziness, paresthesias. Endo:  Denies any problems with DM, thyroid, adrenal function   LAB RESULTS:  CBC on 10/18/2013 had white blood cell count 7.2, hemoglobin 13.2, hematocrit 39.8,  platelets 180,000. MCV 88.8.     Physical Exam: BP 124/78 mmHg  Pulse 72  Ht 5' 9.5" (1.765 m)  Wt 193 lb (87.544 kg)  BMI 28.10 kg/m2 Constitutional: Pleasant,well-developed male in no acute distress. HEENT: Normocephalic and atraumatic. Conjunctivae are normal. No scleral icterus. Neck supple.  Cardiovascular: irregular rhythm Pulmonary/chest: Effort normal and breath sounds normal. No wheezing, rales or rhonchi. Abdominal: Soft, nondistended, nontender. Bowel sounds active throughout. There are no masses palpable. No hepatomegaly. Rectal: no hemorrhoids  noted, DRE with brown stool heme negative Extremities: no edema Lymphadenopathy: No cervical adenopathy noted. Neurological: Alert and oriented to person place and time. Skin: Skin is warm and dry. No rashes noted. Psychiatric: Normal mood and affect. Behavior is normal.  ASSESSMENT AND PLAN:   #1 heme positive stool. Patient had a colonoscopy several years ago that he says was normal. Patient has signed a medical release to obtain a copy of that colonoscopy report. Based on the report, he may be scheduled for a repeat colonoscopy. A CBC and iron studies will be obtained today.  #2. Constipation. He has been advised to try Mira lax one capful in water or juice daily. He was instructed to titrate the dose up or down to achieve the desired effect.  #3 dysphagia. Patient has a history of strictures with food impactions. A barium swallow with pill will be obtained. He will be scheduled for an EGD with possible dilation. The risks, benefits, and alternatives to endoscopy with possible biopsy and possible dilation were discussed with the patient and they consent to proceed. The risk of holding anticoagulation therapy or antiplatelet medications was discussed including the increased risk for thromboembolic disease that may include DVT, pulmonary emboli and stroke. The patient understands this risk and is willing to proceed with temporally  holding the medication provided that this is approved by her PCP or cardiologist. The procedure will be scheduled with Dr. Rhea Belton. Further recommendations will be made pending the findings of his EGD.    Lauris Serviss, Tollie Pizza PA-C 02/08/2014, 11:48 AM   Addendum: Reviewed and agree with initial management. Would follow-up colonoscopy results and determine when last colonoscopy was. If there is any question with heme positive stool would likely repeat this examination Beverley Fiedler, MD

## 2014-02-09 NOTE — Telephone Encounter (Signed)
Faxed Dr. Antionette Char note to Jackolyn Confer regarding holding Eliquis 2 days prior to procedure.  Fax [phone]

## 2014-02-09 NOTE — Telephone Encounter (Signed)
Ok to hold eliquis 2 days prior to procedure. thx

## 2014-02-09 NOTE — Telephone Encounter (Signed)
Called the patient and informed him Per Dr Burt Knack ok to hold Eliquis 2 days prior to procedure. Ok to start holding on 03/11/14. Patient verbalized understanding.

## 2014-02-12 ENCOUNTER — Encounter: Payer: Self-pay | Admitting: Nurse Practitioner

## 2014-02-12 ENCOUNTER — Ambulatory Visit (INDEPENDENT_AMBULATORY_CARE_PROVIDER_SITE_OTHER): Payer: Medicare Other | Admitting: Nurse Practitioner

## 2014-02-12 VITALS — BP 120/60 | HR 70 | Ht 70.0 in | Wt 194.2 lb

## 2014-02-12 DIAGNOSIS — I482 Chronic atrial fibrillation, unspecified: Secondary | ICD-10-CM

## 2014-02-12 DIAGNOSIS — I2510 Atherosclerotic heart disease of native coronary artery without angina pectoris: Secondary | ICD-10-CM

## 2014-02-12 DIAGNOSIS — E785 Hyperlipidemia, unspecified: Secondary | ICD-10-CM

## 2014-02-12 DIAGNOSIS — Z79899 Other long term (current) drug therapy: Secondary | ICD-10-CM | POA: Diagnosis not present

## 2014-02-12 DIAGNOSIS — R0600 Dyspnea, unspecified: Secondary | ICD-10-CM

## 2014-02-12 LAB — BASIC METABOLIC PANEL
BUN: 17 mg/dL (ref 6–23)
CO2: 29 mEq/L (ref 19–32)
Calcium: 9.2 mg/dL (ref 8.4–10.5)
Chloride: 102 mEq/L (ref 96–112)
Creatinine, Ser: 1.7 mg/dL — ABNORMAL HIGH (ref 0.4–1.5)
GFR: 43.4 mL/min — ABNORMAL LOW (ref >60.00)
Glucose, Bld: 90 mg/dL (ref 70–99)
Potassium: 4.3 mEq/L (ref 3.5–5.1)
Sodium: 137 mEq/L (ref 135–145)

## 2014-02-12 LAB — CBC
HCT: 39.7 % (ref 39.0–52.0)
Hemoglobin: 12.8 g/dL — ABNORMAL LOW (ref 13.0–17.0)
MCHC: 32.2 g/dL (ref 30.0–36.0)
MCV: 96.8 fl (ref 78.0–100.0)
Platelets: 150 10*3/uL (ref 150.0–400.0)
RBC: 4.1 Mil/uL — ABNORMAL LOW (ref 4.22–5.81)
RDW: 15.9 % — ABNORMAL HIGH (ref 11.5–15.5)
WBC: 5.9 10*3/uL (ref 4.0–10.5)

## 2014-02-12 LAB — HEPATIC FUNCTION PANEL
ALT: 23 U/L (ref 0–53)
AST: 29 U/L (ref 0–37)
Albumin: 3.6 g/dL (ref 3.5–5.2)
Alkaline Phosphatase: 50 U/L (ref 39–117)
Bilirubin, Direct: 0.1 mg/dL (ref 0.0–0.3)
Total Bilirubin: 0.6 mg/dL (ref 0.2–1.2)
Total Protein: 6.5 g/dL (ref 6.0–8.3)

## 2014-02-12 LAB — LIPID PANEL
Cholesterol: 81 mg/dL (ref 0–200)
HDL: 25.5 mg/dL — ABNORMAL LOW (ref >39.00)
LDL Cholesterol: 42 mg/dL (ref 0–99)
NonHDL: 55.5
Total CHOL/HDL Ratio: 3
Triglycerides: 67 mg/dL (ref 0.0–149.0)
VLDL: 13.4 mg/dL (ref 0.0–40.0)

## 2014-02-12 MED ORDER — SIMVASTATIN 40 MG PO TABS: ORAL_TABLET | ORAL | Status: DC

## 2014-02-12 NOTE — Progress Notes (Signed)
[name] Date of Birth: January 15, 1941 Medical Record [id number]  History of Present Illness: [name] is seen back today for a 4 month check. Seen for Dr. Burt Knack. He has CAD and had past inferior MI in 2010 treated with BMS. Cath in 2011 showed 50% stenosis at proximal edge of his RCA and otherwise no obstructive disease. EF 50 to 55%.  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 Oneida Alar.   Seen here in May - was in atrial fib - Eliquis started for [facility] of [facility]. Echo updated and EF ok. Has seen [facility] this summer - tells me he is not going back - going to "too many doctors" - wanted as much stuff done here as possible.   Last seen by me back in August.  Comes back today. Here alone. He is in a wheelchair. Holds a cup to spit because he drools due to past CVA. His feet hurt due to his neuropathy. No chest pain. Not short of breath. His weight is up - admits he is pretty sedentary. No bleeding. Needs samples of Eliquis to get him thru the year. Did get a flu shot. He is fasting today. Really has no cardiac issues. Seems to be holding his own.   Current Outpatient Prescriptions  Medication Sig Dispense Refill  . apixaban (ELIQUIS) 5 MG TABS tablet Take 1 tablet (5 mg total) by mouth 2 (two) times daily. 60 tablet 0  . diazepam (VALIUM) 5 MG tablet Take 7.5 mg by mouth daily.      . finasteride (PROSCAR) 5 MG tablet Take 5 mg by mouth daily.     . furosemide (LASIX) 20 MG tablet Take 20 mg by mouth daily.     Marland Kitchen HYDROcodone-acetaminophen (NORCO) 7.5-325 MG per tablet     . isosorbide mononitrate (IMDUR) 30 MG 24 hr tablet TAKE 1 TABLET BY MOUTH EVERY DAY 30 tablet 3  . lisinopril (PRINIVIL,ZESTRIL) 10 MG tablet Take 20 mg by mouth daily.     Marland Kitchen NITROSTAT 0.4 MG SL tablet PLACE ONE TABLET UNDER TONGUE AS NEEDED 25 tablet 1  . omeprazole  (PRILOSEC) 20 MG capsule Take 20 mg by mouth once a week.      . potassium chloride SA (KLOR-CON M20) 20 MEQ tablet 1 tablet twice a day for 3 days and then 1 tablet daily 30 tablet 4  . risperiDONE (RISPERDAL) 0.5 MG tablet Take 0.5 mg by mouth daily.      . sertraline (ZOLOFT) 100 MG tablet Take 100 mg by mouth daily.      . simvastatin (ZOCOR) 40 MG tablet TAKE 1 TABLET BY MOUTH AT BEDTIME 90 tablet 0   No current facility-administered medications for this visit.    Allergies  Allergen Reactions  . Hctz [Hydrochlorothiazide] Anaphylaxis and Other (See Comments)    Throat Swelling  . Amitriptyline Other (See Comments)    Made me go crazy  . Gabapentin Other (See Comments)    Made me go crazy  . Metoprolol Tartrate Other (See Comments)    fatigue  . Paroxetine Other (See Comments)    Made me go crazy  . Spiriva Handihaler [Tiotropium Bromide Monohydrate] Other (See Comments)    Urinary retention    Past Medical History  Diagnosis Date  . Coronary artery disease     s/p acute inferoposterior MI secondary  to RCA occlusion  . Hypertension   . Bradycardia   . Dyslipidemia   . Tobacco abuse   . CVA (cerebral vascular accident)   . History of benign prostatic hypertrophy   . GERD (gastroesophageal reflux disease)   . Anxiety   . History of leukocytosis   . Neuropathy   . COPD (chronic obstructive pulmonary disease)   . Peripheral vascular disease   . 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 MI, old   . Foot pain   . DVT (deep venous thrombosis)   . Carotid artery occlusion   . Atrial fibrillation   . Depression   . Colon polyps   . Common bile duct stone     Past Surgical History  Procedure Laterality Date  . Intervention of the rca      using a single BMS  . Ankle surgery    . Bypass graft  2009 or  2010    stent  . Cholecystectomy      Gall Bladder    History  Smoking status  . Former Smoker -- 1.00 packs/day  . Types: Cigarettes  . Start date:  07/20/2012  . Quit date: 09/13/2012  Smokeless tobacco  . Former Systems developer  . Quit date: 09/13/2012    History  Alcohol Use No    Family History  Problem Relation Age of Onset  . Stroke Mother   . Hypertension Mother   . Heart attack Mother   . Heart attack Brother   . Cancer Brother   . Heart disease Brother     AAA  . Hypertension Brother   . Heart disease Brother   . Hypertension Father   . Heart attack Father     Review of Systems: The review of systems is per the HPI.  All other systems were reviewed and are negative.  Physical Exam: BP 120/60 mmHg  Pulse 70  Ht 5\' 10"  (1.778 m)  Wt 194 lb 3.2 oz (88.089 kg)  BMI 27.86 kg/m2  SpO2 96% Patient is very pleasant and in no acute distress. He is in a wheelchair. Weight is up. Skin is warm and dry. Color is normal.  HEENT is unremarkable. Normocephalic/atraumatic. PERRL. Sclera are nonicteric. Neck is supple. No masses. No JVD. Lungs are clear. Cardiac exam shows an irregular rhythm. His rate is ok. Abdomen is soft. Extremities are with trace edema. Brawny stasis changes. Gait not tested.  No gross neurologic deficits noted.  Wt Readings from Last 3 Encounters:  02/12/14 194 lb 3.2 oz (88.089 kg)  02/08/14 193 lb (87.544 kg)  10/18/13 184 lb 6.4 oz (83.643 kg)    LABORATORY DATA/PROCEDURES: PENDING  Lab Results  Component Value Date   WBC 6.2 02/08/2014   HGB 13.2 02/08/2014   HCT 39.8 02/08/2014   PLT 159.0 02/08/2014   GLUCOSE 100* 10/26/2013   CHOL 81 10/18/2013   TRIG 80.0 10/18/2013   HDL 32.10* 10/18/2013   LDLCALC 33 10/18/2013   ALT 20 10/18/2013   AST 23 10/18/2013   NA 140 10/26/2013   K 3.9 10/26/2013   CL 103 10/26/2013   CREATININE 1.7* 10/26/2013   BUN 12 10/26/2013   CO2 29 10/26/2013   TSH 3.394 Test methodology is 3rd generation TSH 04/22/2009   INR 1.15 03/25/2010   HGBA1C  08/15/2008    5.2 (NOTE) The ADA recommends the following therapeutic goal for glycemic control related to Hgb A1c  measurement: Goal of therapy: <6.5 Hgb A1c  Reference: American Diabetes Association: Clinical Practice Recommendations 2010, Diabetes Care, 2010, 33: (Suppl  1).    BNP (last 3 results) No results for input(s): PROBNP in the last 8760 hours.  Echo Study Conclusions from June of 2015  - Left ventricle: The cavity size was normal. There was mild focal basal hypertrophy of the septum. Systolic function was normal. The estimated ejection fraction was in the range of 50% to 55%. There is akinesis of the basalinferior myocardium. - Aortic valve: There was mild regurgitation. - Mitral valve: Calcified annulus. There was mild regurgitation.   Assessment / Plan: 1. Atrial fib - managed with rate control and anticoagulation - will try to give samples of Eliquis given today. Recheck surveillance labs.   2. CAD - no active chest pain  3. HTN - BP ok  4. PVD - managed conservatively - he remains quite sedentary  5. HLD - check labs today. He says he is fasting. Zocor refilled.   6. Neuropathy  Overall, he seems to be holding his own. No change with his current regimen. Seen back in 6 months.   Patient is agreeable to this plan and will call if any problems develop in the interim.   Burtis Junes, RN, [place] [address] [place], [place]  [address] [phone]

## 2014-02-12 NOTE — Patient Instructions (Signed)
We will be checking the following labs today BMET, CBC, BNP, TSH, HPF and Lipids  Stay on your current medicines  We will look for samples of Eliquis today  I refilled the Zocor today (simvastatin)  See Dr. Burt Knack in 6 months  Call the [place] office at [phone] if you have any questions, problems or concerns.

## 2014-02-13 ENCOUNTER — Ambulatory Visit (HOSPITAL_COMMUNITY): Payer: Medicare Other

## 2014-02-13 LAB — BRAIN NATRIURETIC PEPTIDE: Pro B Natriuretic peptide (BNP): 115 pg/mL — ABNORMAL HIGH (ref 0.0–100.0)

## 2014-02-14 LAB — TSH: TSH: 2.4 u[IU]/mL (ref 0.35–4.50)

## 2014-02-19 ENCOUNTER — Ambulatory Visit (HOSPITAL_COMMUNITY)
Admission: RE | Admit: 2014-02-19 | Discharge: 2014-02-19 | Disposition: A | Discharge status: Home / Self Care | Payer: Medicare Other | Source: Ambulatory Visit | Attending: Physician Assistant | Admitting: Physician Assistant

## 2014-02-19 DIAGNOSIS — R1314 Dysphagia, pharyngoesophageal phase: Secondary | ICD-10-CM | POA: Diagnosis not present

## 2014-02-19 DIAGNOSIS — K222 Esophageal obstruction: Secondary | ICD-10-CM | POA: Diagnosis not present

## 2014-02-21 ENCOUNTER — Telehealth: Payer: Self-pay | Admitting: Nurse Practitioner

## 2014-02-21 NOTE — Telephone Encounter (Signed)
New Msg        Pt daughter [name] calling, would like call back.   States father is waiting on results from a procedure completed on 02/19/14.

## 2014-02-22 NOTE — Telephone Encounter (Signed)
New Message      Pt calling stating the test he had done on 02/19/14 was ordered by Truitt Merle and he is wanting the results. Please call back and advise.

## 2014-02-22 NOTE — Telephone Encounter (Signed)
Pt had a barium swallow ordered by [facility]. Their PA also had the first name Cecille Rubin. Pt understands & I have given him their number to call for results  Horton Chin RN

## 2014-02-26 ENCOUNTER — Telehealth: Payer: Self-pay | Admitting: Internal Medicine

## 2014-02-26 ENCOUNTER — Other Ambulatory Visit: Payer: Self-pay

## 2014-02-26 DIAGNOSIS — R1314 Dysphagia, pharyngoesophageal phase: Secondary | ICD-10-CM

## 2014-02-26 NOTE — Telephone Encounter (Signed)
Spoke with pt, see result note.  

## 2014-02-27 ENCOUNTER — Other Ambulatory Visit (HOSPITAL_COMMUNITY): Payer: Self-pay | Admitting: Internal Medicine

## 2014-02-27 DIAGNOSIS — R195 Other fecal abnormalities: Secondary | ICD-10-CM | POA: Diagnosis not present

## 2014-02-27 DIAGNOSIS — Z6826 Body mass index (BMI) 26.0-26.9, adult: Secondary | ICD-10-CM | POA: Diagnosis not present

## 2014-02-27 DIAGNOSIS — F329 Major depressive disorder, single episode, unspecified: Secondary | ICD-10-CM | POA: Diagnosis not present

## 2014-02-27 DIAGNOSIS — I252 Old myocardial infarction: Secondary | ICD-10-CM | POA: Diagnosis not present

## 2014-02-27 DIAGNOSIS — R1310 Dysphagia, unspecified: Secondary | ICD-10-CM

## 2014-02-27 DIAGNOSIS — R7301 Impaired fasting glucose: Secondary | ICD-10-CM | POA: Diagnosis not present

## 2014-02-27 DIAGNOSIS — E785 Hyperlipidemia, unspecified: Secondary | ICD-10-CM | POA: Diagnosis not present

## 2014-02-27 DIAGNOSIS — I10 Essential (primary) hypertension: Secondary | ICD-10-CM | POA: Diagnosis not present

## 2014-02-27 DIAGNOSIS — I480 Paroxysmal atrial fibrillation: Secondary | ICD-10-CM | POA: Diagnosis not present

## 2014-02-27 DIAGNOSIS — J449 Chronic obstructive pulmonary disease, unspecified: Secondary | ICD-10-CM | POA: Diagnosis not present

## 2014-02-27 DIAGNOSIS — Z7901 Long term (current) use of anticoagulants: Secondary | ICD-10-CM | POA: Diagnosis not present

## 2014-02-27 DIAGNOSIS — I2510 Atherosclerotic heart disease of native coronary artery without angina pectoris: Secondary | ICD-10-CM | POA: Diagnosis not present

## 2014-03-01 ENCOUNTER — Ambulatory Visit (HOSPITAL_COMMUNITY)
Admission: RE | Admit: 2014-03-01 | Discharge: 2014-03-01 | Disposition: A | Discharge status: Home / Self Care | Payer: Medicare Other | Source: Ambulatory Visit | Attending: Internal Medicine | Admitting: Internal Medicine

## 2014-03-01 ENCOUNTER — Encounter: Payer: Self-pay | Admitting: Internal Medicine

## 2014-03-01 DIAGNOSIS — N189 Chronic kidney disease, unspecified: Secondary | ICD-10-CM | POA: Diagnosis not present

## 2014-03-01 DIAGNOSIS — I2510 Atherosclerotic heart disease of native coronary artery without angina pectoris: Secondary | ICD-10-CM | POA: Diagnosis not present

## 2014-03-01 DIAGNOSIS — I4891 Unspecified atrial fibrillation: Secondary | ICD-10-CM | POA: Diagnosis not present

## 2014-03-01 DIAGNOSIS — I129 Hypertensive chronic kidney disease with stage 1 through stage 4 chronic kidney disease, or unspecified chronic kidney disease: Secondary | ICD-10-CM | POA: Diagnosis not present

## 2014-03-01 DIAGNOSIS — J449 Chronic obstructive pulmonary disease, unspecified: Secondary | ICD-10-CM | POA: Insufficient documentation

## 2014-03-01 DIAGNOSIS — I252 Old myocardial infarction: Secondary | ICD-10-CM | POA: Diagnosis not present

## 2014-03-01 DIAGNOSIS — R1312 Dysphagia, oropharyngeal phase: Secondary | ICD-10-CM | POA: Insufficient documentation

## 2014-03-01 DIAGNOSIS — R1310 Dysphagia, unspecified: Secondary | ICD-10-CM

## 2014-03-01 DIAGNOSIS — I69392 Facial weakness following cerebral infarction: Secondary | ICD-10-CM | POA: Insufficient documentation

## 2014-03-01 DIAGNOSIS — R633 Feeding difficulties: Secondary | ICD-10-CM | POA: Diagnosis not present

## 2014-03-01 DIAGNOSIS — K219 Gastro-esophageal reflux disease without esophagitis: Secondary | ICD-10-CM | POA: Diagnosis not present

## 2014-03-01 DIAGNOSIS — D126 Benign neoplasm of colon, unspecified: Secondary | ICD-10-CM | POA: Insufficient documentation

## 2014-03-01 DIAGNOSIS — R1314 Dysphagia, pharyngoesophageal phase: Secondary | ICD-10-CM

## 2014-03-01 NOTE — Procedures (Signed)
Objective Swallowing Evaluation: Modified Barium Swallowing Study  Patient Details  Name: [name] MRN: [id number] Date of Birth: Dec 22, 1940  Today's Date: 03/01/2014 Time: 1120-1150 SLP Time Calculation (min) (ACUTE ONLY): 30 min  Past Medical History:  Past Medical History  Diagnosis Date  . 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 Hypertension   . Bradycardia   . Dyslipidemia   . Tobacco abuse   . CVA (cerebral vascular accident)   . History of benign prostatic hypertrophy   . GERD (gastroesophageal reflux disease)   . Anxiety   . History of leukocytosis   . Neuropathy   . COPD (chronic obstructive pulmonary disease)   . Peripheral vascular disease   . 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 MI, old   . Foot pain   . DVT (deep venous thrombosis)   . Carotid artery occlusion   . Atrial fibrillation   . Depression   . Colon polyps   . Common bile duct stone    Past Surgical History:  Past Surgical History  Procedure Laterality Date  . Intervention of the rca      using a single BMS  . Ankle surgery    . Bypass graft  2009 or  2010    stent  . Cholecystectomy      Gall Bladder   HPI:  Pt is a 74 year old male arriving for an outpatient MBS due to finding on esophagram of aspiration of thick barium. Pt also found to have esopahgeal stricture with lans for dilatation later this month. He also has a remote history of CVA in the 90s with residual right sided labial droop. He manages his secretions by spitting them into a cup. He lives alone, eats what he wants and has no recent history of pna.      Assessment / Plan / Recommendation Clinical Impression  Dysphagia Diagnosis: Mild pharyngeal phase dysphagia;Mild oral phase dysphagia Clinical impression: Pt demonstrates a mild oropharyngeal dysphagia with reduced airway closure during the swallow leading to trace frank penetration. With second swallow pt expels  penetrate though no cough is elicited. A chin tuck was found to be quite effective in eliminating penetration. Pt was able to complete with min verbal cues with a cup or a straw. Overall, impairment is mild and risk of aspiration pna likely is low. Pt has not had any negative consequence of this and eats and drinks what he likes at home. Advised pt and son of possibility of worsening of function with time/acute illness. Recommend pt continue a regular texture diet and thin liquids and utilize a chin tuck. SLP provided this instruction, though unlikely that pt will be compliant. Also provided esophageal precautions as pt was observed to have stasis to the proximal esophagus. No SLP f/u recommended or desired by pt.     Treatment Recommendation  No treatment recommended at this time    Diet Recommendation Regular;Thin liquid   Liquid Administration via: Cup;Straw Medication Administration: Whole meds with puree Supervision: Patient able to self feed Compensations: Slow rate;Small sips/bites;Follow solids with liquid Postural Changes and/or Swallow Maneuvers: Chin tuck;Seated upright 90 degrees;Upright 30-60 min after meal    Other  Recommendations Oral Care Recommendations: Patient independent with oral care   Follow Up Recommendations  None    Frequency and Duration        Pertinent Vitals/Pain NA    SLP Swallow Goals  General HPI: Pt is a 74 year old male arriving for an outpatient MBS due to finding on esophagram of aspiration of thick barium. Pt also found to have esopahgeal stricture with lans for dilatation later this month. He also has a remote history of CVA in the 90s with residual right sided labial droop. He manages his secretions by spitting them into a cup. He lives alone, eats what he wants and has no recent history of pna.  Type of Study: Modified Barium Swallowing Study Reason for Referral: Objectively evaluate swallowing function Diet Prior to this Study: Regular;Thin  liquids Temperature Spikes Noted: N/A Respiratory Status: Room air History of Recent Intubation: No Behavior/Cognition: Alert;Cooperative;Pleasant mood Oral Cavity - Dentition: Edentulous Oral Motor / Sensory Function: Impaired motor Oral impairment: Right labial Self-Feeding Abilities: Able to feed self Patient Positioning: Upright in chair Baseline Vocal Quality: Clear Volitional Cough: Strong Volitional Swallow: Able to elicit Anatomy: Within functional limits    Reason for Referral Objectively evaluate swallowing function   Oral Phase Oral Preparation/Oral Phase Oral Phase: Impaired Oral - Thin Oral - Thin Cup: Delayed oral transit;Lingual/palatal residue Oral - Thin Straw: Delayed oral transit;Lingual/palatal residue Oral - Solids Oral - Regular: Impaired mastication Oral - Pill: Delayed oral transit   Pharyngeal Phase Pharyngeal Phase Pharyngeal Phase: Impaired Pharyngeal - Thin Pharyngeal - Thin Cup: Reduced airway/laryngeal closure;Penetration/Aspiration during swallow;Pharyngeal residue - valleculae;Compensatory strategies attempted (Comment) (chin tuck) Penetration/Aspiration details (thin cup): Material enters airway, CONTACTS cords then ejected out;Material does not enter airway Pharyngeal - Thin Straw: Reduced airway/laryngeal closure;Penetration/Aspiration during swallow;Pharyngeal residue - valleculae Penetration/Aspiration details (thin straw): Material enters airway, CONTACTS cords then ejected out;Material does not enter airway Pharyngeal - Solids Pharyngeal - Puree: Within functional limits Pharyngeal - Regular: Within functional limits Pharyngeal - Pill: Within functional limits  Cervical Esophageal Phase    GO         Functional Assessment Tool Used:  (clnical judgment) Functional Limitations: Swallowing Swallow Current Status [id number]): At least 1 percent but less than 20 percent impaired, limited or restricted Swallow Goal Status [phone]): At  least 1 percent but less than 20 percent impaired, limited or restricted Swallow Discharge Status [phone]): At least 1 percent but less than 20 percent impaired, limited or restricted   [facility], [place] CCC-SLP [phone]  Lynann Beaver 03/01/2014, 3:42 PM

## 2014-03-13 ENCOUNTER — Ambulatory Visit (AMBULATORY_SURGERY_CENTER): Payer: Medicare Other | Admitting: Internal Medicine

## 2014-03-13 ENCOUNTER — Encounter: Payer: Self-pay | Admitting: Internal Medicine

## 2014-03-13 VITALS — BP 155/87 | HR 63 | Temp 96.9°F | Resp 10

## 2014-03-13 DIAGNOSIS — R1310 Dysphagia, unspecified: Secondary | ICD-10-CM | POA: Diagnosis not present

## 2014-03-13 DIAGNOSIS — Q394 Esophageal web: Secondary | ICD-10-CM

## 2014-03-13 DIAGNOSIS — Z8673 Personal history of transient ischemic attack (TIA), and cerebral infarction without residual deficits: Secondary | ICD-10-CM | POA: Diagnosis not present

## 2014-03-13 DIAGNOSIS — R933 Abnormal findings on diagnostic imaging of other parts of digestive tract: Secondary | ICD-10-CM | POA: Diagnosis not present

## 2014-03-13 DIAGNOSIS — K222 Esophageal obstruction: Secondary | ICD-10-CM

## 2014-03-13 DIAGNOSIS — I4891 Unspecified atrial fibrillation: Secondary | ICD-10-CM | POA: Diagnosis not present

## 2014-03-13 MED ORDER — SODIUM CHLORIDE 0.9 % IV SOLN: 500.0000 mL | INTRAVENOUS | Status: DC

## 2014-03-13 MED ORDER — OMEPRAZOLE 40 MG PO CPDR: 40.0000 mg | DELAYED_RELEASE_CAPSULE | Freq: Every day | ORAL | Status: DC

## 2014-03-13 NOTE — Progress Notes (Signed)
There was a 2x2i bruise on the patient's left hand when he arrived in recovery.   His left arm has some slight bruising noted from the blood pressure cuff.  The bruising was minor and unchanged at discharge.

## 2014-03-13 NOTE — Progress Notes (Signed)
A/ox3 pleased with MAC, report to Suzanne RN 

## 2014-03-13 NOTE — Patient Instructions (Addendum)
YOU HAD AN ENDOSCOPIC PROCEDURE TODAY AT [facility]: Refer to the procedure report that was given to you for any specific questions about what was found during the examination.  If the procedure report does not answer your questions, please call your gastroenterologist to clarify.  If you requested that your care partner not be given the details of your procedure findings, then the procedure report has been included in a sealed envelope for you to review at your convenience later.  YOU SHOULD EXPECT: Some feelings of bloating in the abdomen. Passage of more gas than usual.  Walking can help get rid of the air that was put into your GI tract during the procedure and reduce the bloating. If you had a lower endoscopy (such as a colonoscopy or flexible sigmoidoscopy) you may notice spotting of blood in your stool or on the toilet paper. If you underwent a bowel prep for your procedure, then you may not have a normal bowel movement for a few days.  DIET: Only clear liquids by mouth until 11:30am.   If those are tolerated well, he may have a soft diet for the rest of today.  Tomorrow he may have a regular diet. Drink plenty of fluids but you should avoid alcoholic beverages for 24 hours.  ACTIVITY: Your care partner should take you home directly after the procedure.  You should plan to take it easy, moving slowly for the rest of the day.  You can resume normal activity the day after the procedure however you should NOT DRIVE or use heavy machinery for 24 hours (because of the sedation medicines used during the test).    SYMPTOMS TO REPORT IMMEDIATELY: A gastroenterologist can be reached at any hour.  During normal business hours, 8:30 AM to 5:00 PM Monday through Friday, call [phone].  After hours and on weekends, please call the GI answering service at [phone] who will take a message and have the physician on call contact you.   Following upper endoscopy (EGD)  Vomiting of  blood or coffee ground material  New chest pain or pain under the shoulder blades  Painful or persistently difficult swallowing  New shortness of breath  Fever of 100F or higher  Black, tarry-looking stools  FOLLOW UP: If any biopsies were taken you will be contacted by phone or by letter within the next 1-3 weeks.  Call your gastroenterologist if you have not heard about the biopsies in 3 weeks.  Our staff will call the home number listed on your records the next business day following your procedure to check on you and address any questions or concerns that you may have at that time regarding the information given to you following your procedure. This is a courtesy call and so if there is no answer at the home number and we have not heard from you through the emergency physician on call, we will assume that you have returned to your regular daily activities without incident.  SIGNATURES/CONFIDENTIALITY: You and/or your care partner have signed paperwork which will be entered into your electronic medical record.  These signatures attest to the fact that that the information above on your After Visit Summary has been reviewed and is understood.  Full responsibility of the confidentiality of this discharge information lies with you and/or your care-partner.  Please, read all of the handouts given to you by your recovery room nurse.  RESUME YOUR ELIQUIS TOMORROW. TAKE YOUR OMEPRAZOLE EVERY DAY AND YOUR SUCRALAFATE AS ORDERED.  Speech pathology will strengthen the muscles in the throat.

## 2014-03-13 NOTE — Op Note (Signed)
[facility]  [address] [place], [id number]   ENDOSCOPY PROCEDURE REPORT  PATIENT: James, [name]  MR#: [id number] BIRTHDATE: 06-23-1940 , 57  yrs. old GENDER: male ENDOSCOPIST: Jerene Bears, MD REFERRED BY:  Janalyn Rouse, M.D. PROCEDURE DATE:  03/13/2014 PROCEDURE:  EGD w/ balloon dilation ASA CLASS:     Class III INDICATIONS:  dysphagia and abnormal barium esophagram showing distal esophageal stricture. MEDICATIONS: Monitored anesthesia care and Propofol 120 mg IV TOPICAL ANESTHETIC: none  DESCRIPTION OF PROCEDURE: After the risks benefits and alternatives of the procedure were thoroughly explained, informed consent was obtained.  The [id number] endoscope was introduced through the mouth and advanced to the second portion of the duodenum , Without limitations.  The instrument was slowly withdrawn as the mucosa was fully examined.   ESOPHAGUS: Reflux esophagitis was found in the mid esophagus and distal esophagus.   A mildly severe Schatzki ring was found 40 cm from the incisors.  Using a TTS-balloon the stricture was dilated up to 67mm.  Following this dilation, there was a small amount of heme.  STOMACH: The mucosa of the stomach appeared normal.  J-shaped stomach.  DUODENUM: The duodenal mucosa showed no abnormalities in the bulb and 2nd part of the duodenum.  Retroflexed views revealed no abnormalities.     The scope was then withdrawn from the patient and the procedure completed.  COMPLICATIONS: There were no immediate complications.     ENDOSCOPIC IMPRESSION: 1.   Reflux esophagitis in the mid esophagus and distal esophagus 2.   Schatzki ring was found 40 cm from the incisors; Using a TTS-balloon the stricture was dilated up to 24mm; Following this dilation, there was a small amount of heme 3.   The mucosa of the stomach appeared normal 4.   The duodenal mucosa showed no abnormalities in the bulb and 2nd part of the  duodenum  RECOMMENDATIONS: 1.  Omeprazole 40 mg daily given reflux esophagitis, liquid sucralfate if able to swallow with aspiration 2.  Anti-reflux regimen to be followed 3.  Okay to resume Eliquis tomorrow 4.  Follow-up records from previous colonoscopy as requested at last office visit with [facility], PA-C  eSigned:  Jerene Bears, MD 03/13/2014 10:39 AM    CC:The Patient and Janalyn Rouse, MD  PATIENT NAME:  James, [name] MR#: [id number]

## 2014-03-14 ENCOUNTER — Telehealth: Payer: Self-pay

## 2014-03-14 NOTE — Telephone Encounter (Signed)
  Follow up Call-  Call back number 03/13/2014  Post procedure Call Back phone  # [phone] hm  Permission to leave phone message Yes     Patient questions:  Do you have a fever, pain , or abdominal swelling? No. Pain Score  0 *  Have you tolerated food without any problems? Yes.    Have you been able to return to your normal activities? Yes.    Do you have any questions about your discharge instructions: Diet   No. Medications  No. Follow up visit  No.  Do you have questions or concerns about your Care? No.  Actions: * If pain score is 4 or above: No action needed, pain <4.

## 2014-03-19 ENCOUNTER — Telehealth: Payer: Self-pay | Admitting: Internal Medicine

## 2014-03-19 NOTE — Telephone Encounter (Signed)
Pts daughter states that the pt does not want to come in for the OV that is scheduled. States if he needs to have a colon done he will do that but he does not want to come for the OV. Please advise.

## 2014-03-20 NOTE — Telephone Encounter (Signed)
Will need again permission to hold anticoagulation per prescribing provider Okay to proceed via previsit assuming anticoagulation okay to be held per primary provider

## 2014-03-21 DIAGNOSIS — F332 Major depressive disorder, recurrent severe without psychotic features: Secondary | ICD-10-CM | POA: Diagnosis not present

## 2014-03-21 NOTE — Telephone Encounter (Signed)
Ok to hold 48 hours prior to colonoscopy. thx

## 2014-03-21 NOTE — Telephone Encounter (Signed)
Pt is scheduled for Colonoscopy with Dr. Hilarie Fredrickson 04/16/14. Pt is currently taking Eliquis. Dr. Burt Knack please advise how pt may hold Eliquis prior to colonoscopy.

## 2014-03-23 ENCOUNTER — Ambulatory Visit: Payer: Medicare Other | Admitting: Physician Assistant

## 2014-03-29 ENCOUNTER — Telehealth: Payer: Self-pay

## 2014-03-29 NOTE — Telephone Encounter (Signed)
Patient daughter call to get samples of eliquis placed samples up front

## 2014-04-08 ENCOUNTER — Other Ambulatory Visit: Payer: Self-pay | Admitting: Cardiovascular Disease

## 2014-04-16 ENCOUNTER — Encounter: Payer: Medicare Other | Admitting: Internal Medicine

## 2014-04-26 ENCOUNTER — Telehealth: Payer: Self-pay | Admitting: *Deleted

## 2014-04-26 ENCOUNTER — Other Ambulatory Visit: Payer: Self-pay | Admitting: *Deleted

## 2014-04-26 NOTE — Telephone Encounter (Signed)
Eliquis samples placed at the front desk for patient. 

## 2014-05-14 ENCOUNTER — Telehealth: Payer: Self-pay | Admitting: Internal Medicine

## 2014-05-14 ENCOUNTER — Ambulatory Visit (AMBULATORY_SURGERY_CENTER): Payer: Self-pay

## 2014-05-14 VITALS — Ht 70.0 in | Wt 192.0 lb

## 2014-05-14 DIAGNOSIS — Z860100 Personal history of colon polyps, unspecified: Secondary | ICD-10-CM

## 2014-05-14 MED ORDER — MOVIPREP 100 G PO SOLR: 1.0000 | Freq: Once | ORAL | Status: DC

## 2014-05-14 NOTE — Progress Notes (Signed)
No allergies to eggs or soy No diet/weight loss meds No home oxygen No past problems with anesthesia  No email 

## 2014-05-14 NOTE — Telephone Encounter (Signed)
Called Miralax instructions to [facility] (emergency contact) and mailed pt a copy.  Levada Dy PV

## 2014-05-29 ENCOUNTER — Encounter: Payer: Self-pay | Admitting: Internal Medicine

## 2014-05-29 ENCOUNTER — Ambulatory Visit (AMBULATORY_SURGERY_CENTER): Payer: Medicare Other | Admitting: Internal Medicine

## 2014-05-29 VITALS — BP 136/64 | HR 70 | Temp 96.1°F | Resp 41 | Ht 70.0 in | Wt 194.0 lb

## 2014-05-29 DIAGNOSIS — Z86010 Personal history of colonic polyps: Secondary | ICD-10-CM

## 2014-05-29 DIAGNOSIS — D124 Benign neoplasm of descending colon: Secondary | ICD-10-CM | POA: Diagnosis not present

## 2014-05-29 DIAGNOSIS — I739 Peripheral vascular disease, unspecified: Secondary | ICD-10-CM | POA: Diagnosis not present

## 2014-05-29 DIAGNOSIS — I10 Essential (primary) hypertension: Secondary | ICD-10-CM | POA: Diagnosis not present

## 2014-05-29 DIAGNOSIS — D122 Benign neoplasm of ascending colon: Secondary | ICD-10-CM

## 2014-05-29 DIAGNOSIS — J449 Chronic obstructive pulmonary disease, unspecified: Secondary | ICD-10-CM | POA: Diagnosis not present

## 2014-05-29 DIAGNOSIS — Z8673 Personal history of transient ischemic attack (TIA), and cerebral infarction without residual deficits: Secondary | ICD-10-CM | POA: Diagnosis not present

## 2014-05-29 DIAGNOSIS — I6529 Occlusion and stenosis of unspecified carotid artery: Secondary | ICD-10-CM | POA: Diagnosis not present

## 2014-05-29 DIAGNOSIS — R195 Other fecal abnormalities: Secondary | ICD-10-CM | POA: Diagnosis not present

## 2014-05-29 DIAGNOSIS — I4891 Unspecified atrial fibrillation: Secondary | ICD-10-CM | POA: Diagnosis not present

## 2014-05-29 DIAGNOSIS — I2510 Atherosclerotic heart disease of native coronary artery without angina pectoris: Secondary | ICD-10-CM | POA: Diagnosis not present

## 2014-05-29 DIAGNOSIS — I252 Old myocardial infarction: Secondary | ICD-10-CM | POA: Diagnosis not present

## 2014-05-29 MED ORDER — SODIUM CHLORIDE 0.9 % IV SOLN: 500.0000 mL | INTRAVENOUS | Status: DC

## 2014-05-29 NOTE — Patient Instructions (Addendum)
YOU HAD AN ENDOSCOPIC PROCEDURE TODAY AT [facility]:   Refer to the procedure report that was given to you for any specific questions about what was found during the examination.  If the procedure report does not answer your questions, please call your gastroenterologist to clarify.  If you requested that your care partner not be given the details of your procedure findings, then the procedure report has been included in a sealed envelope for you to review at your convenience later.  YOU SHOULD EXPECT: Some feelings of bloating in the abdomen. Passage of more gas than usual.  Walking can help get rid of the air that was put into your GI tract during the procedure and reduce the bloating. If you had a lower endoscopy (such as a colonoscopy or flexible sigmoidoscopy) you may notice spotting of blood in your stool or on the toilet paper. If you underwent a bowel prep for your procedure, you may not have a normal bowel movement for a few days.  Please Note:  You might notice some irritation and congestion in your nose or some drainage.  This is from the oxygen used during your procedure.  There is no need for concern and it should clear up in a day or so.  SYMPTOMS TO REPORT IMMEDIATELY:   Following lower endoscopy (colonoscopy or flexible sigmoidoscopy):  Excessive amounts of blood in the stool  Significant tenderness or worsening of abdominal pains  Swelling of the abdomen that is new, acute  Fever of 100F or higher   For urgent or emergent issues, a gastroenterologist can be reached at any hour by calling [phone].   DIET: Your first meal following the procedure should be a small meal and then it is ok to progress to your normal diet. Heavy or fried foods are harder to digest and may make you feel nauseous or bloated.  Likewise, meals heavy in dairy and vegetables can increase bloating.  Drink plenty of fluids but you should avoid alcoholic beverages for 24  hours.  ACTIVITY:  You should plan to take it easy for the rest of today and you should NOT DRIVE or use heavy machinery until tomorrow (because of the sedation medicines used during the test).    FOLLOW UP: Our staff will call the number listed on your records the next business day following your procedure to check on you and address any questions or concerns that you may have regarding the information given to you following your procedure. If we do not reach you, we will leave a message.  However, if you are feeling well and you are not experiencing any problems, there is no need to return our call.  We will assume that you have returned to your regular daily activities without incident.  If any biopsies were taken you will be contacted by phone or by letter within the next 1-3 weeks.  Please call us at [phone] if you have not heard about the biopsies in 3 weeks.    SIGNATURES/CONFIDENTIALITY: You and/or your care partner have signed paperwork which will be entered into your electronic medical record.  These signatures attest to the fact that that the information above on your After Visit Summary has been reviewed and is understood.  Full responsibility of the confidentiality of this discharge information lies with you and/or your care-partner.  Recommendations Next colonoscopy determined by pathology results. Discharge instructions given to patient and/or care partner. Polyp handout provided.

## 2014-05-29 NOTE — Progress Notes (Signed)
Procedure ends, to recovery, report to Doyle Askew, Therapist, sports, VSS

## 2014-05-29 NOTE — Progress Notes (Signed)
Called to room to assist during endoscopic procedure.  Patient ID and intended procedure confirmed with present staff. Received instructions for my participation in the procedure from the performing physician.  

## 2014-05-29 NOTE — Op Note (Signed)
[place]  [address] [place], [address]   COLONOSCOPY PROCEDURE REPORT  PATIENT: [name], [name]  MR#: [id number] BIRTHDATE: Aug 31, 1940 , 44  yrs. old GENDER: male ENDOSCOPIST: Jerene Bears, MD REFERRED BY:W.  Lutricia Feil, M.D. PROCEDURE DATE:  05/29/2014 PROCEDURE:   Colonoscopy with snare polypectomy and Colonoscopy, surveillance First Screening Colonoscopy - Avg.  risk and is 50 yrs.  old or older - No.  Prior Negative Screening - Now for repeat screening. N/A  History of Adenoma - Now for follow-up colonoscopy & has been > or = to 3 yrs.  Yes hx of adenoma.  Has been 3 or more years since last colonoscopy. ASA CLASS:   Class III INDICATIONS:Surveillance due to prior colonic neoplasia. MEDICATIONS: Monitored anesthesia care and Propofol 150 mg IV  DESCRIPTION OF PROCEDURE:   After the risks benefits and alternatives of the procedure were thoroughly explained, informed consent was obtained.  The digital rectal exam revealed no rectal mass.   The LB [id number]  endoscope was introduced through the anus and advanced to the cecum, which was identified by both the appendix and ileocecal valve. No adverse events experienced. The quality of the prep was (MoviPrep was used) good.  The instrument was then slowly withdrawn as the colon was fully examined.   COLON FINDINGS: Three sessile polyps ranging between 3-43mm in size were found in the ascending colon (2) and descending colon (1). Polypectomies were performed with a cold snare.  The resection was complete, the polyp tissue was completely retrieved and sent to histology.   The examination was otherwise normal.  Retroflexion was not performed due to a narrow rectal vault but inspection revealed no concerning distal rectal lesions. The time to cecum = 7.2 Withdrawal time = 15.2   The scope was withdrawn and the procedure completed.  COMPLICATIONS: There were no immediate  complications.  ENDOSCOPIC IMPRESSION: 1.   Three sessile polyps ranging between 3-19mm in size were found in the ascending colon and descending colon; polypectomies were performed with a cold snare 2.   The examination was otherwise normal  RECOMMENDATIONS: 1.  Await pathology results 2.  You will receive a letter within 1-2 weeks with the results of your biopsy as well as final recommendations.  Please call my office if you have not received a letter after 3 weeks. 3.  Okay to resume Eliquis tomorrow with standard dose  eSigned:  Jerene Bears, MD 05/29/2014 11:38 AM reviCC: Janalyn Rouse, MD and The Patient

## 2014-05-30 ENCOUNTER — Telehealth: Payer: Self-pay

## 2014-05-30 NOTE — Telephone Encounter (Signed)
  Follow up Call-  Call back number 05/29/2014 03/13/2014  Post procedure Call Back phone  # [phone]  Permission to leave phone message No Yes     Patient questions:  Do you have a fever, pain , or abdominal swelling? No. Pain Score  0 *  Have you tolerated food without any problems? Yes.    Have you been able to return to your normal activities? Yes.    Do you have any questions about your discharge instructions: Diet   No. Medications  No. Follow up visit  No.  Do you have questions or concerns about your Care? No.  Actions: * If pain score is 4 or above: No action needed, pain <4.

## 2014-06-01 ENCOUNTER — Telehealth: Payer: Self-pay | Admitting: Cardiovascular Disease

## 2014-06-01 NOTE — Telephone Encounter (Signed)
I spoke with [name] and made her aware that we cannot continue to provide the pt with samples of Eliquis (last 3 month supply has been samples). I advised her that I will give the pt a 28 day supply at this time but she needs to speak with the pt about switching to coumadin due to cost of Eliquis. The pt was unable to take Xarelto in the past due to cost. [name] will follow-up with our office after speaking with the pt in regards to changing medication.

## 2014-06-01 NOTE — Telephone Encounter (Signed)
Pt's dtr calling re samples of Eliquis--pt can't afford the 400 a month and only has a few pills left

## 2014-06-05 ENCOUNTER — Encounter: Payer: Self-pay | Admitting: Internal Medicine

## 2014-06-20 ENCOUNTER — Other Ambulatory Visit: Payer: Self-pay | Admitting: *Deleted

## 2014-06-20 MED ORDER — POTASSIUM CHLORIDE CRYS ER 20 MEQ PO TBCR: 20.0000 meq | EXTENDED_RELEASE_TABLET | Freq: Every day | ORAL | Status: DC

## 2014-07-31 DIAGNOSIS — I482 Chronic atrial fibrillation, unspecified: Secondary | ICD-10-CM | POA: Insufficient documentation

## 2014-07-31 DIAGNOSIS — I2510 Atherosclerotic heart disease of native coronary artery without angina pectoris: Secondary | ICD-10-CM | POA: Insufficient documentation

## 2014-07-31 NOTE — Progress Notes (Signed)
Cardiology Office Note   Date:  08/01/2014   ID:  [name], DOB 1940-08-10, MRN [id number]  PCP:  James Redwood, MD  Cardiologist:  Dr. Sherren Mocha     Chief Complaint  Patient presents with  . Coronary Artery Disease  . Atrial Fibrillation     History of Present Illness: [name] is a 74 y.o. male with a hx of CAD s/p inferior MI tx with BMS in 2010, HTN, HL, bradycardia, prior CVA with residual facial droop, chronic AFib, Carotid stenosis, COPD, PAD.  Last seen by James Merle, NP 12/15.  He has had trouble affording Eliquis.  The last phone note indicates that the patient may need to switch to Coumadin 2/2 cost.    He returns for follow-up. He notes increased pedal edema today. He has chronic dyspnea with exertion. He is NYHA 3. He denies chest discomfort. He sleeps on 3 pillows chronically. There has been no change. He denies PND. He denies cough or wheezing. He does admit to a syncopal episode about 6 months ago. This occurred in the setting of a medication change. It sounds as though he had orthostatic hypotension. There has been no recurrence. He continues to smoke "vapes".  He has continued to be able to get Eliquis. He has significant transportation problems. Coumadin would be difficult for him to take.   Studies/Reports Reviewed Today:  Carotid US 7/15 R 40%, L 40-59%  Echo 6/15 Mild focal basal septal hypertrophy, EF 50-55%, inferior AK Mild AI MAC, mild MR  LHC 2/11 LAD:  Mid 40, dist 30 LCx:  Dist 50 RCA: prox 50 at edge of stent R iliac occluded; L iliac 80 EF 50-55%   Past Medical History  Diagnosis Date  . 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 Hypertension   . Bradycardia   . Dyslipidemia   . Tobacco abuse   . CVA (cerebral vascular accident)   . History of benign prostatic hypertrophy   . GERD (gastroesophageal reflux disease)   . Anxiety   . History of leukocytosis   .  Neuropathy   . COPD (chronic obstructive pulmonary disease)   . Peripheral vascular disease   . 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 MI, old   . Foot pain   . DVT (deep venous thrombosis)   . Carotid artery occlusion   . Atrial fibrillation   . Depression   . Colon polyps   . Common bile duct stone   . Cataract     bilataeral cateracts removed  . Neuromuscular disorder     neuropathy    Past Surgical History  Procedure Laterality Date  . Intervention of the rca      using a single BMS  . Ankle surgery    . Bypass graft  2009 or  2010    stent  . Cholecystectomy      Gall Bladder  . Colonoscopy      Current Outpatient Prescriptions  Medication Sig Dispense Refill  . apixaban (ELIQUIS) 5 MG TABS tablet Take 1 tablet (5 mg total) by mouth 2 (two) times daily. 60 tablet 0  . diazepam (VALIUM) 5 MG tablet Take 7.5 mg by mouth daily.      . finasteride (PROSCAR) 5 MG tablet Take 5 mg by mouth daily.     . furosemide (LASIX) 20 MG tablet Take 20 mg by mouth daily.     Marland Kitchen  HYDROcodone-acetaminophen (NORCO) 7.5-325 MG per tablet     . isosorbide mononitrate (IMDUR) 30 MG 24 hr tablet TAKE 1 TABLET BY MOUTH EVERY DAY 30 tablet 3  . lisinopril (PRINIVIL,ZESTRIL) 10 MG tablet Take 20 mg by mouth daily.     Marland Kitchen NITROSTAT 0.4 MG SL tablet PLACE ONE TABLET UNDER TONGUE AS NEEDED 25 tablet 1  . omeprazole (PRILOSEC) 40 MG capsule Take 1 capsule (40 mg total) by mouth daily. 90 capsule 3  . potassium chloride SA (K-DUR,KLOR-CON) 20 MEQ tablet Take 1 tablet (20 mEq total) by mouth daily. 30 tablet 1  . risperiDONE (RISPERDAL) 0.5 MG tablet Take 0.5 mg by mouth daily.      . sertraline (ZOLOFT) 50 MG tablet Take 50 mg by mouth daily.     . simvastatin (ZOCOR) 40 MG tablet TAKE 1 TABLET BY MOUTH AT BEDTIME 30 tablet 11   No current facility-administered medications for this visit.    Allergies:   Hctz; Amitriptyline; Gabapentin; Metoprolol tartrate; Paroxetine; and Spiriva handihaler     Social History:  The patient  reports that he quit smoking about 22 months ago. His smoking use included Cigarettes and E-cigarettes. He started smoking about 2 years ago. He smoked 1.00 pack per day. He quit smokeless tobacco use about 22 months ago. He reports that he does not drink alcohol or use illicit drugs.   Family History:  The patient's family history includes Cancer in his brother; Heart attack in his brother, father, and mother; Heart disease in his brother and brother; Hypertension in his brother, father, and mother; Stroke in his mother. There is no history of Colon cancer, Esophageal cancer, Rectal cancer, or Stomach cancer.    ROS:   Please see the history of present illness.   Review of Systems  Constitution: Positive for malaise/fatigue.  Cardiovascular: Positive for orthopnea.  Hematologic/Lymphatic: Bruises/bleeds easily.  Gastrointestinal: Positive for constipation.  Genitourinary:       Difficulty urinating   Neurological: Positive for loss of balance.  Psychiatric/Behavioral: Positive for depression. The patient is nervous/anxious.   All other systems reviewed and are negative.    PHYSICAL EXAM: VS:  BP 138/70 mmHg  Pulse 68  Ht 5\' 10"  (1.778 m)  Wt 201 lb (91.173 kg)  BMI 28.84 kg/m2    Wt Readings from Last 3 Encounters:  08/01/14 201 lb (91.173 kg)  05/29/14 194 lb (87.998 kg)  05/14/14 192 lb (87.091 kg)     GEN: Well nourished, well developed, in no acute distress HEENT: normal Neck: no JVD,  no masses Cardiac:  Normal S1/S2, irregularly irregular; no murmur ,  no rubs or gallops, trace bilateral LE edema   Respiratory:  clear to auscultation bilaterally, no wheezing, rhonchi or rales. GI: soft, nontender, nondistended, + BS MS: no deformity or atrophy Skin: warm and dry  Neuro:  R facial droop noted Psych: Normal affect   EKG:  EKG is ordered today.  It demonstrates:   Atrial fibrillation, HR 68, PVCs, no significant change since prior  tracing   Recent Labs: 02/12/2014: ALT 23; BUN 17; Creatinine, Ser 1.7*; Hemoglobin 12.8*; Platelets 150.0; Potassium 4.3; Pro B Natriuretic peptide (BNP) 115.0*; Sodium 137; TSH 2.40    Lipid Panel    Component Value Date/Time   CHOL 81 02/12/2014 1028   TRIG 67.0 02/12/2014 1028   HDL 25.50* 02/12/2014 1028   CHOLHDL 3 02/12/2014 1028   VLDL 13.4 02/12/2014 1028   LDLCALC 42 02/12/2014 1028  ASSESSMENT AND PLAN:  Coronary artery disease: No angina. He is not on aspirin as he is on Eliquis. Continue nitrates, ACE inhibitor, statin.   Chronic atrial fibrillation: He has been able to continue on Eliquis. He is on the appropriate dose. He is not having any bleeding complications. Check follow-up BMET, CBC today.  Carotid stenosis, bilateral: He has follow-up with vascular surgery next month.  Essential hypertension: Controlled. Continue nitrates, ACE inhibitor.   HLD (hyperlipidemia):  Continue statin. LDL optimal in December 2015.  Edema: He has minimally increased pedal edema today. His weight is up since last being seen. However, he otherwise does not have significant evidence of volume excess on exam. He does have chronic dyspnea. I will check a BNP. He is on Lasix. He has increased PVCs on ECG today. I will also check a BMET, magnesium. He can take an extra dose of Lasix today and again tomorrow if needed. If he feels that he should continue on higher dose of Lasix for longer, he should call. If his BNP is significantly elevated, I will increase his Lasix and consider follow-up echocardiogram.   Current medicines are reviewed at length with the patient today.  Concerns regarding medicines are as outlined above.  The following changes have been made:    As outlined above.   Labs/ tests ordered today include:  Orders Placed This Encounter  Procedures  . Basic metabolic panel  . Brain natriuretic peptide  . CBC with Differential/Platelet  . Magnesium  . EKG 12-Lead      Disposition:   FU with Dr. Burt Knack 6 months   Signed, Versie Starks, MHS 08/01/2014 9:01 AM    [facility] [address], [place], [place]  [address] Phone: [phone]; Fax: [phone]

## 2014-08-01 ENCOUNTER — Ambulatory Visit (INDEPENDENT_AMBULATORY_CARE_PROVIDER_SITE_OTHER): Payer: Medicare Other | Admitting: Physician Assistant

## 2014-08-01 ENCOUNTER — Encounter: Payer: Self-pay | Admitting: Physician Assistant

## 2014-08-01 VITALS — BP 138/70 | HR 68 | Ht 70.0 in | Wt 201.0 lb

## 2014-08-01 DIAGNOSIS — R0602 Shortness of breath: Secondary | ICD-10-CM | POA: Diagnosis not present

## 2014-08-01 DIAGNOSIS — I482 Chronic atrial fibrillation, unspecified: Secondary | ICD-10-CM

## 2014-08-01 DIAGNOSIS — I2510 Atherosclerotic heart disease of native coronary artery without angina pectoris: Secondary | ICD-10-CM | POA: Diagnosis not present

## 2014-08-01 DIAGNOSIS — I6523 Occlusion and stenosis of bilateral carotid arteries: Secondary | ICD-10-CM

## 2014-08-01 DIAGNOSIS — E785 Hyperlipidemia, unspecified: Secondary | ICD-10-CM

## 2014-08-01 DIAGNOSIS — I10 Essential (primary) hypertension: Secondary | ICD-10-CM | POA: Diagnosis not present

## 2014-08-01 LAB — CBC WITH DIFFERENTIAL/PLATELET
Basophils Absolute: 0.1 10*3/uL (ref 0.0–0.1)
Basophils Relative: 0.6 % (ref 0.0–3.0)
Eosinophils Absolute: 0.2 10*3/uL (ref 0.0–0.7)
Eosinophils Relative: 2.4 % (ref 0.0–5.0)
HCT: 40.8 % (ref 39.0–52.0)
Hemoglobin: 13.5 g/dL (ref 13.0–17.0)
Lymphocytes Relative: 28.6 % (ref 12.0–46.0)
Lymphs Abs: 2.4 10*3/uL (ref 0.7–4.0)
MCHC: 33.1 g/dL (ref 30.0–36.0)
MCV: 92.1 fl (ref 78.0–100.0)
Monocytes Absolute: 0.8 10*3/uL (ref 0.1–1.0)
Monocytes Relative: 9.8 % (ref 3.0–12.0)
Neutro Abs: 5 10*3/uL (ref 1.4–7.7)
Neutrophils Relative %: 58.6 % (ref 43.0–77.0)
Platelets: 159 10*3/uL (ref 150.0–400.0)
RBC: 4.43 Mil/uL (ref 4.22–5.81)
RDW: 15.6 % — ABNORMAL HIGH (ref 11.5–15.5)
WBC: 8.5 10*3/uL (ref 4.0–10.5)

## 2014-08-01 LAB — BASIC METABOLIC PANEL
BUN: 9 mg/dL (ref 6–23)
CO2: 31 mEq/L (ref 19–32)
Calcium: 9.3 mg/dL (ref 8.4–10.5)
Chloride: 102 mEq/L (ref 96–112)
Creatinine, Ser: 1.76 mg/dL — ABNORMAL HIGH (ref 0.40–1.50)
GFR: 40.24 mL/min — ABNORMAL LOW (ref >60.00)
Glucose, Bld: 86 mg/dL (ref 70–99)
Potassium: 4 mEq/L (ref 3.5–5.1)
Sodium: 137 mEq/L (ref 135–145)

## 2014-08-01 LAB — BRAIN NATRIURETIC PEPTIDE: Pro B Natriuretic peptide (BNP): 128 pg/mL — ABNORMAL HIGH (ref 0.0–100.0)

## 2014-08-01 LAB — MAGNESIUM: Magnesium: 2 mg/dL (ref 1.5–2.5)

## 2014-08-01 NOTE — Patient Instructions (Signed)
Medication Instructions:  You can take an extra Lasix 20 mg today.  If your leg swelling is not better tomorrow, you can take an extra Lasix 20 mg tomorrow.  If you feel like you need to increase your Lasix for longer, please call us.  Labwork: Today: BMET CBC Magnesium BNP  Testing/Procedures: None   Follow-Up: Dr. Sherren Mocha in 6 months.  Any Other Special Instructions Will Be Listed Below (If Applicable).

## 2014-08-02 ENCOUNTER — Other Ambulatory Visit: Payer: Self-pay | Admitting: Cardiovascular Disease

## 2014-08-03 ENCOUNTER — Other Ambulatory Visit: Payer: Self-pay

## 2014-08-03 NOTE — Telephone Encounter (Signed)
Per note 6.8.16

## 2014-08-15 ENCOUNTER — Other Ambulatory Visit: Payer: Self-pay | Admitting: Cardiovascular Disease

## 2014-08-22 ENCOUNTER — Other Ambulatory Visit: Payer: Self-pay | Admitting: Adult Health

## 2014-08-24 NOTE — Telephone Encounter (Signed)
Per note 6.8.16

## 2014-09-12 DIAGNOSIS — F332 Major depressive disorder, recurrent severe without psychotic features: Secondary | ICD-10-CM | POA: Diagnosis not present

## 2014-09-17 ENCOUNTER — Encounter: Payer: Self-pay | Admitting: Family

## 2014-09-20 ENCOUNTER — Ambulatory Visit (INDEPENDENT_AMBULATORY_CARE_PROVIDER_SITE_OTHER)
Admission: RE | Admit: 2014-09-20 | Discharge: 2014-09-20 | Disposition: A | Discharge status: Home / Self Care | Payer: Medicare Other | Source: Ambulatory Visit | Attending: Family | Admitting: Family

## 2014-09-20 ENCOUNTER — Encounter: Payer: Self-pay | Admitting: Family

## 2014-09-20 ENCOUNTER — Ambulatory Visit (HOSPITAL_COMMUNITY)
Admission: RE | Admit: 2014-09-20 | Discharge: 2014-09-20 | Disposition: A | Discharge status: Home / Self Care | Payer: Medicare Other | Source: Ambulatory Visit | Attending: Family | Admitting: Family

## 2014-09-20 ENCOUNTER — Ambulatory Visit (INDEPENDENT_AMBULATORY_CARE_PROVIDER_SITE_OTHER): Payer: Medicare Other | Admitting: Family

## 2014-09-20 VITALS — BP 152/80 | HR 100 | Temp 97.2°F | Resp 16 | Ht 70.5 in | Wt 202.0 lb

## 2014-09-20 DIAGNOSIS — M7989 Other specified soft tissue disorders: Secondary | ICD-10-CM | POA: Insufficient documentation

## 2014-09-20 DIAGNOSIS — Z789 Other specified health status: Secondary | ICD-10-CM | POA: Diagnosis not present

## 2014-09-20 DIAGNOSIS — I6523 Occlusion and stenosis of bilateral carotid arteries: Secondary | ICD-10-CM | POA: Diagnosis not present

## 2014-09-20 DIAGNOSIS — I10 Essential (primary) hypertension: Secondary | ICD-10-CM | POA: Insufficient documentation

## 2014-09-20 DIAGNOSIS — I779 Disorder of arteries and arterioles, unspecified: Secondary | ICD-10-CM | POA: Diagnosis not present

## 2014-09-20 DIAGNOSIS — Z87891 Personal history of nicotine dependence: Secondary | ICD-10-CM

## 2014-09-20 DIAGNOSIS — Z720 Tobacco use: Secondary | ICD-10-CM

## 2014-09-20 DIAGNOSIS — Z8673 Personal history of transient ischemic attack (TIA), and cerebral infarction without residual deficits: Secondary | ICD-10-CM

## 2014-09-20 NOTE — Patient Instructions (Signed)

## 2014-09-20 NOTE — Progress Notes (Signed)
Filed Vitals:   09/20/14 1056 09/20/14 1059 09/20/14 1101  BP: 137/70 151/78 152/80  Pulse: 67 100 100  Temp: 97.2 F (36.2 C)    Resp: 16    Height: 5' 10.5" (1.791 m)    Weight: 202 lb (91.627 kg)    SpO2: 99%

## 2014-09-20 NOTE — Progress Notes (Signed)
[facility] [place] HISTORY AND PHYSICAL   MRN : [id number]  History of Present Illness:   [name] is a 74 y.o. male patient of Dr. Oneida Alar who returns today for carotid artery surveillance. 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 He also has residual dysphagia, right facial droop, denies known monocular vision loss, denies much difficulty with expressive aphasia.  He stopped smoking in 2014, uses a nicotine vapor product. He is on Aspirin and Plavix for antiplatelet therapy. His atherosclerotic risk factors remain elevated cholesterol, hypertension, smoking, and coronary artery disease. These are all currently stable and followed by his primary care physician and his cardiolgist. He is trying to quit smoking and is currently on electronic cigarettes which he has weaned down to 12 mcg. He denies any new neurologic events including amaurosis, numbness, or weakness. He has also previously had a right external iliac stent and left common iliac stent in 2011. He denies claudication symptoms. 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  He had ABI's and dopplers at Dr. Honor Junes office on 08/02/13 which are abnormal. Dr. Burt Knack indicated that he would be inclined to manage him medically as he has very limited functional capacity.  He is performing arm and seated leg exercises as discussed about 3x/week.  Pt states he does not walk much due to pain in feet, states he has swelling in ankles and lower legs, denies non healing wounds. He was started on coumadin after his stroke, then to Plavix and ASA, the started Eliquis and stopped the Plavix, per pt, managed by his cardiologist. He takes a daily statin.  Pt Diabetic: No Pt smoker: former smoker, quit in 2014, smokes 12% nicotine vapes, trying to cut back   Pt meds include: Statin :Yes ASA: No Other anticoagulants/antiplatelets:  Eliquis  Current Outpatient Prescriptions  Medication Sig Dispense Refill  . diazepam (VALIUM) 5 MG tablet Take 7.5 mg by mouth daily.      Marland Kitchen ELIQUIS 5 MG TABS tablet TAKE 1 TABLET (5 MG TOTAL) BY MOUTH 2 (TWO) TIMES DAILY. 60 tablet 6  . finasteride (PROSCAR) 5 MG tablet Take 5 mg by mouth daily.     . furosemide (LASIX) 20 MG tablet Take 20 mg by mouth daily.     Marland Kitchen HYDROcodone-acetaminophen (NORCO) 7.5-325 MG per tablet     . isosorbide mononitrate (IMDUR) 30 MG 24 hr tablet TAKE 1 TABLET BY MOUTH EVERY DAY 30 tablet 3  . KLOR-CON M20 20 MEQ tablet TAKE 1 TABLET (20 MEQ TOTAL) BY MOUTH DAILY. 30 tablet 5  . lisinopril (PRINIVIL,ZESTRIL) 10 MG tablet Take 20 mg by mouth daily.     Marland Kitchen NITROSTAT 0.4 MG SL tablet PLACE ONE TABLET UNDER TONGUE AS NEEDED 25 tablet 1  . omeprazole (PRILOSEC) 40 MG capsule Take 1 capsule (40 mg total) by mouth daily. 90 capsule 3  . risperiDONE (RISPERDAL) 0.5 MG tablet Take 0.5 mg by mouth daily.      . sertraline (ZOLOFT) 50 MG tablet Take 50 mg by mouth daily.     . simvastatin (ZOCOR) 40 MG tablet TAKE 1 TABLET BY MOUTH AT BEDTIME 30 tablet 11   No current facility-administered medications for this visit.    Past Medical History  Diagnosis Date  . 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 Hypertension   . Bradycardia   .  Dyslipidemia   . Tobacco abuse   . CVA (cerebral vascular accident)   . History of benign prostatic hypertrophy   . GERD (gastroesophageal reflux disease)   . Anxiety   . History of leukocytosis   . Neuropathy   . COPD (chronic obstructive pulmonary disease)   . Peripheral vascular disease   . 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 MI, old   . Foot pain   . DVT (deep venous thrombosis)   . Carotid artery occlusion   . Atrial fibrillation   . Depression   . Colon polyps   . Common bile duct stone   . Cataract     bilataeral cateracts removed  . Neuromuscular disorder      neuropathy    Social History History  Substance Use Topics  . Smoking status: Former Smoker -- 1.00 packs/day    Types: Cigarettes, E-cigarettes    Start date: 07/20/2012    Quit date: 09/13/2012  . Smokeless tobacco: Former Systems developer    Quit date: 09/13/2012     Comment: currently using e-cigarettes  . Alcohol Use: No    Family History Family History  Problem Relation Age of Onset  . Stroke Mother   . Hypertension Mother   . Heart attack Mother   . Heart attack Brother   . Cancer Brother   . Heart disease Brother     AAA  . Hypertension Brother   . Heart disease Brother   . Hypertension Father   . Heart attack Father   . Colon cancer Neg Hx   . Esophageal cancer Neg Hx   . Rectal cancer Neg Hx   . Stomach cancer Neg Hx     Surgical History Past Surgical History  Procedure Laterality Date  . Intervention of the rca      using a single BMS  . Ankle surgery    . Bypass graft  2009 or  2010    stent  . Cholecystectomy      Gall Bladder  . Colonoscopy      Allergies  Allergen Reactions  . Hctz [Hydrochlorothiazide] Anaphylaxis and Other (See Comments)    Throat Swelling  . Amitriptyline Other (See Comments)    Made me go crazy  . Gabapentin Other (See Comments)    Made me go crazy  . Metoprolol Tartrate Other (See Comments)    fatigue  . Paroxetine Other (See Comments)    Made me go crazy  . Spiriva Handihaler [Tiotropium Bromide Monohydrate] Other (See Comments)    Urinary retention    Current Outpatient Prescriptions  Medication Sig Dispense Refill  . diazepam (VALIUM) 5 MG tablet Take 7.5 mg by mouth daily.      Marland Kitchen ELIQUIS 5 MG TABS tablet TAKE 1 TABLET (5 MG TOTAL) BY MOUTH 2 (TWO) TIMES DAILY. 60 tablet 6  . finasteride (PROSCAR) 5 MG tablet Take 5 mg by mouth daily.     . furosemide (LASIX) 20 MG tablet Take 20 mg by mouth daily.     Marland Kitchen HYDROcodone-acetaminophen (NORCO) 7.5-325 MG per tablet     . isosorbide mononitrate (IMDUR) 30 MG 24 hr tablet  TAKE 1 TABLET BY MOUTH EVERY DAY 30 tablet 3  . KLOR-CON M20 20 MEQ tablet TAKE 1 TABLET (20 MEQ TOTAL) BY MOUTH DAILY. 30 tablet 5  . lisinopril (PRINIVIL,ZESTRIL) 10 MG tablet Take 20 mg by mouth daily.     Marland Kitchen NITROSTAT 0.4 MG SL tablet PLACE ONE TABLET  UNDER TONGUE AS NEEDED 25 tablet 1  . omeprazole (PRILOSEC) 40 MG capsule Take 1 capsule (40 mg total) by mouth daily. 90 capsule 3  . risperiDONE (RISPERDAL) 0.5 MG tablet Take 0.5 mg by mouth daily.      . sertraline (ZOLOFT) 50 MG tablet Take 50 mg by mouth daily.     . simvastatin (ZOCOR) 40 MG tablet TAKE 1 TABLET BY MOUTH AT BEDTIME 30 tablet 11   No current facility-administered medications for this visit.     REVIEW OF SYSTEMS: See HPI for pertinent positives and negatives.  Physical Examination Filed Vitals:   09/20/14 1056 09/20/14 1059  BP: 137/70 151/78  Pulse: 67 100  Temp: 97.2 F (36.2 C)   Resp: 16   Height: 5' 10.5" (1.791 m)   Weight: 202 lb (91.627 kg)   SpO2: 99%    Body mass index is 28.56 kg/(m^2).  General: WDWN in NAD Gait: Normal HENT: WNL. He caries a spit cup due to residual dysphagia. Eyes: Pupils equal Pulmonary: normal non-labored breathing , without Rales, rhonchi, wheezing Cardiac: Irregular rhythm, no murmur detected  Abdomen: soft, NT, no palpable masses Skin: no rashes, no ulcers; no Gangrene , no cellulitis; no open wounds.  VASCULAR EXAM  Carotid Bruits Left Right   Negative Negative   Radial pulses: are 2+ palpable and =  Aorta: is not palpable   VASCULAR EXAM: Extremities without ischemic changes  without Gangrene; without open wounds; 2-3+ pitting edema in both ankles and lower legs with mild venous insufficiancy.     LE Pulses LEFT RIGHT       FEMORAL faintly palpable Not palpable   POPLITEAL  not palpable  not palpable   POSTERIOR TIBIAL 1+ palpable  not palpable    DORSALIS PEDIS  ANTERIOR TIBIAL not palpable  not palpable     Musculoskeletal: no muscle wasting or atrophy. Neurologic: A&O X 3; somewhat flat Affect ;  MOTOR FUNCTION: 4/5 Symmetric, CN 2-12 intact except for right facial droop Speech is slightly garbled.          Non-Invasive Vascular Imaging (09/20/2014):  CEREBROVASCULAR DUPLEX EVALUATION    INDICATION: Carotid disease    PREVIOUS INTERVENTION(S):     DUPLEX EXAM:     RIGHT  LEFT  Peak Systolic Velocities (cm/s) End Diastolic Velocities (cm/s) Plaque LOCATION Peak Systolic Velocities (cm/s) End Diastolic Velocities (cm/s) Plaque  66 10  CCA PROXIMAL 68 15   53 10  CCA MID 73 15 HT  41 12 HT CCA DISTAL 61 14 HT  110 0 HT ECA 89 0 HT  82 20 HT ICA PROXIMAL 145 55 HT  65 24  ICA MID 95 32   58 23  ICA DISTAL 72 25     2.0 ICA / CCA Ratio (PSV) 2.4  Antegrade/Dampened Vertebral Flow Antegrade  [id number] Brachial Systolic Pressure (mmHg) [id number]  Multiphasic (subclavian artery) Brachial Artery Waveforms Multiphasic (subclavian artery)    Plaque Morphology:  HM = Homogeneous, HT = Heterogeneous, CP = Calcific Plaque, SP = Smooth Plaque, IP = Irregular Plaque     ADDITIONAL FINDINGS: No significant stenosis of the bilateral external or common carotid arteries.    IMPRESSION: Doppler velocities suggest a 1-39% stenosis of the right proximal internal carotid artery and  a 40-59% stenosis of the left proximal internal carotid artery.    Compared to the previous exam:  No significant change noted when compared to the previous exam on 09/13/13.  ABI (Date: 09/20/2014)  R: 0.92 (08/02/13, 0.77), DP: monophasic, PT: biphasic, TBI: 0.43 (0.45)       L: 0000000 - super-systolic ([id number]), DP: biphasic, PT: biphasic, TBI: 0.68 (0.46) ABI's are unreliable due to the presence of super-systolic tibial pressure which suggests medial  calcification. Waveforms are mostly biphasic. TBI is stable on the right, improved on the left.      ASSESSMENT:  AXXEL MASTIN is a 74 y.o. 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 He also has residual dysphagia, right facial droop, denies known monocular vision loss, denies much difficulty with expressive aphasia.  He is also s/p right external iliac stent and left common iliac stent in 2011. He denies claudication symptoms. 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 He stopped smoking in 2014, uses nicotine vapor product. ABI's are unreliable due to the presence of super-systolic tibial pressure which suggests medial calcification. Waveforms are mostly biphasic. TBI is stable on the right, improved on the left.  Today's carotid Duplex suggests 1-39% stenosis of the right proximal internal carotid artery and  a 40-59% stenosis of the left proximal internal carotid artery. No significant change noted when compared to the previous exam on 09/13/13.   Face to face time with patient was 25 minutes. Over 50% of this time was spent on counseling and coordination of care.   PLAN:    Based on today's exam and non-invasive vascular lab results, the patient will follow up in 1 year with the following tests: carotid Duplex and ABI's. I discussed in depth with the patient the nature of atherosclerosis, and emphasized the importance of maximal medical management including strict control of blood pressure, blood glucose, and lipid levels, obtaining regular exercise, and cessation of smoking.  The patient is aware that without maximal medical management the underlying atherosclerotic disease process will progress, limiting the benefit of any interventions.  The patient was given information about stroke prevention and what symptoms should prompt the patient to seek immediate medical care.  The patient was  given information about PAD including signs, symptoms, treatment, what symptoms should prompt the patient to seek immediate medical care, and risk reduction measures to take. Thank you for allowing [place] to participate in this patient's care.  Clemon Chambers, RN, MSN, FNP-C [facility] Office: [phone]  Clinic MD: Oneida Alar  09/20/2014 11:00 AM

## 2014-11-08 ENCOUNTER — Telehealth: Payer: Self-pay | Admitting: Internal Medicine

## 2014-11-08 DIAGNOSIS — Z6827 Body mass index (BMI) 27.0-27.9, adult: Secondary | ICD-10-CM | POA: Diagnosis not present

## 2014-11-08 DIAGNOSIS — Z23 Encounter for immunization: Secondary | ICD-10-CM | POA: Diagnosis not present

## 2014-11-08 DIAGNOSIS — R1310 Dysphagia, unspecified: Secondary | ICD-10-CM | POA: Diagnosis not present

## 2014-11-08 DIAGNOSIS — I480 Paroxysmal atrial fibrillation: Secondary | ICD-10-CM | POA: Diagnosis not present

## 2014-11-08 NOTE — Telephone Encounter (Signed)
Pt is able to swallow water and able to take his pills. Pt scheduled to see James Bogus PA 11/13/14@2 :45pmColletta [place] to fax records and notify pt of appt, she will also instruct pt to seek care at the ER if he worsens.

## 2014-11-13 ENCOUNTER — Ambulatory Visit (INDEPENDENT_AMBULATORY_CARE_PROVIDER_SITE_OTHER): Payer: Medicare Other | Admitting: Gastroenterology

## 2014-11-13 ENCOUNTER — Encounter: Payer: Self-pay | Admitting: Gastroenterology

## 2014-11-13 VITALS — BP 136/74 | HR 72 | Wt 200.0 lb

## 2014-11-13 DIAGNOSIS — K5900 Constipation, unspecified: Secondary | ICD-10-CM | POA: Diagnosis not present

## 2014-11-13 DIAGNOSIS — R1314 Dysphagia, pharyngoesophageal phase: Secondary | ICD-10-CM | POA: Diagnosis not present

## 2014-11-13 DIAGNOSIS — I779 Disorder of arteries and arterioles, unspecified: Secondary | ICD-10-CM | POA: Diagnosis not present

## 2014-11-13 MED ORDER — LUBIPROSTONE 8 MCG PO CAPS: 8.0000 ug | ORAL_CAPSULE | Freq: Two times a day (BID) | ORAL | Status: DC

## 2014-11-13 NOTE — Progress Notes (Addendum)
     11/13/2014 [name] [id number] 08-01-1940   History of Present Illness:  This is a 74 year old male who is known to Dr. Hilarie Fredrickson for dysphagia and heme positive stools.  Has multiple medical problems including history of CVA in 1995, CAD with bypass surgery, HTN, HLD, COPD, PVD, CKD, history of atrial fibrillation, and also history of DVT.  On Eliquis.  Returns to our office today at the request of his PCP with recurrent complaints of dysphagia.  Acutely worsened while he was sick with a URI "cold" last week.  Says that he could not swallow even liquids for 3 days.  Symptoms are now improved with resolution of URI symptoms; almost returned to baseline.  Ate a sandwich this morning without issues.  EGD on 02/2014 showed esophagitis to the mid and distal esophagus. Also had a Schatzki's ring that was dilated with the balloon to 12 mm.  He also had a speech pathology swallowing evaluation in January 2016 which showed transient laryngeal penetration with ingestion of thin liquids which responded to chin tuck, delayed oropharyngeal phase and pooling within the vallecular with all ingested substances, and inability to swallow tablet unless mixed with applesauce. Most of these componenets are likely chronic secondary to CVA. He also chronically has to spit his saliva into cup since his CVA in 1995, which is likely due to the pooling.  He also reports chronic constipation. He says that this is been a long-standing issue for him. Oftentimes he has to disimpact himself. He denies seeing any blood in his stool. He is tried MiraLAX in the past, but says that that did not work for him. Last colonoscopy was in April 2016 at which time he was found have 3 sessile polyps that were removed with both tubular adenoma and hyperplastic components on pathology.   Current Medications, Allergies, Past Medical History, Past Surgical History, Family History and Social History were reviewed in [employer] record.   Physical Exam: There were no vitals taken for this visit. General:  Chronically ill-appearing white male in no acute distress; in wheelchair Head: Normocephalic and atraumatic Eyes:  Sclerae anicteric, conjunctiva pink  Ears: Normal auditory acuity Lungs: Clear throughout to auscultation Heart: Regular rate and rhythm Abdomen: Soft, non-distended.  Normal bowel sounds.  Non-tender. Musculoskeletal: Symmetrical with no gross deformities  Extremities: No edema  Neurological: Alert oriented x 4, grossly non-focal Psychological:  Alert and cooperative.  Flat affect.  Assessment and Recommendations: -Constipation:  Long-standing.  Will try Amitiza 8 mcg BID with food.  I have asked him to call here with update in 2-3 weeks to see if medication needs to be adjusted. -Dysphagia:  Chronic component likely due to CVA, but had acute worsening during recent URI.  Now improved again almost to baseline with resolution of URI symptoms.  Will monitor for now.  Addendum: Reviewed and agree with current management. Jerene Bears, MD

## 2014-11-13 NOTE — Patient Instructions (Signed)
We sent a prescription to [facility] [place], [place] for Amitiza 8 mcg. Take 1 tablet twice daily.   Call us in 2-3 weeks with an update and ask for [facility], nurse.

## 2014-12-12 ENCOUNTER — Other Ambulatory Visit: Payer: Self-pay | Admitting: Cardiovascular Disease

## 2015-01-07 DIAGNOSIS — R3911 Hesitancy of micturition: Secondary | ICD-10-CM | POA: Diagnosis not present

## 2015-01-07 DIAGNOSIS — N400 Enlarged prostate without lower urinary tract symptoms: Secondary | ICD-10-CM | POA: Diagnosis not present

## 2015-01-07 DIAGNOSIS — R3912 Poor urinary stream: Secondary | ICD-10-CM | POA: Diagnosis not present

## 2015-01-07 DIAGNOSIS — Z125 Encounter for screening for malignant neoplasm of prostate: Secondary | ICD-10-CM | POA: Diagnosis not present

## 2015-02-08 ENCOUNTER — Other Ambulatory Visit: Payer: Self-pay

## 2015-02-08 MED ORDER — POTASSIUM CHLORIDE CRYS ER 20 MEQ PO TBCR: EXTENDED_RELEASE_TABLET | ORAL | Status: DC

## 2015-02-08 NOTE — Telephone Encounter (Signed)
Liliane Shi, PA-C at 07/31/2014 10:05 PM  potassium chloride SA (K-DUR,KLOR-CON) 20 MEQ tabletTake 1 tablet (20 mEq total) by mouth daily Notes Recorded by Liliane Shi, PA-C on 08/01/2014 at 5:43 PM K+ ok Creatinine stable Mg2+ ok Hgb stable BNP only minimally elevated Continue with current treatment plan. Richardson Dopp, PA-C  08/01/2014 5:42 PM

## 2015-03-06 ENCOUNTER — Other Ambulatory Visit: Payer: Self-pay | Admitting: Nurse Practitioner

## 2015-03-13 DIAGNOSIS — F332 Major depressive disorder, recurrent severe without psychotic features: Secondary | ICD-10-CM | POA: Diagnosis not present

## 2015-03-18 ENCOUNTER — Telehealth: Payer: Self-pay | Admitting: Cardiovascular Disease

## 2015-03-18 MED ORDER — APIXABAN 5 MG PO TABS: ORAL_TABLET | ORAL | Status: DC

## 2015-03-18 NOTE — Telephone Encounter (Signed)
Called pt to clarify which pharmacy that the pt would like for his Rx to be sent to. Pt stated that he would like [facility] [place]. Pt's Rx was sent to pt pharmacy as requested. Confirmation received.

## 2015-03-18 NOTE — Telephone Encounter (Signed)
New Message   *STAT* If patient is at the pharmacy, call can be transferred to refill team.   1. Which medications need to be refilled? (please list name of each medication and dose if known) Eliquis  2. Which pharmacy/location (including street and city if local pharmacy) is medication to be sent to[facility] [place] [phone][facility]   3. Do they need a 30 day or 90 day supply? 30 day supply

## 2015-03-21 ENCOUNTER — Inpatient Hospital Stay (HOSPITAL_COMMUNITY)
Admission: EM | Admit: 2015-03-21 | Discharge: 2015-03-22 | DRG: 313 | Disposition: A | Discharge status: Home Health | Payer: Medicare Other | Attending: Internal Medicine | Admitting: Internal Medicine

## 2015-03-21 ENCOUNTER — Emergency Department (HOSPITAL_COMMUNITY): Payer: Medicare Other

## 2015-03-21 ENCOUNTER — Encounter (HOSPITAL_COMMUNITY): Payer: Self-pay | Admitting: Emergency Medicine

## 2015-03-21 DIAGNOSIS — F411 Generalized anxiety disorder: Secondary | ICD-10-CM | POA: Diagnosis not present

## 2015-03-21 DIAGNOSIS — Z7901 Long term (current) use of anticoagulants: Secondary | ICD-10-CM

## 2015-03-21 DIAGNOSIS — I482 Chronic atrial fibrillation, unspecified: Secondary | ICD-10-CM | POA: Diagnosis present

## 2015-03-21 DIAGNOSIS — R0789 Other chest pain: Principal | ICD-10-CM | POA: Diagnosis present

## 2015-03-21 DIAGNOSIS — Z888 Allergy status to other drugs, medicaments and biological substances status: Secondary | ICD-10-CM | POA: Diagnosis not present

## 2015-03-21 DIAGNOSIS — R079 Chest pain, unspecified: Secondary | ICD-10-CM | POA: Diagnosis not present

## 2015-03-21 DIAGNOSIS — I69392 Facial weakness following cerebral infarction: Secondary | ICD-10-CM

## 2015-03-21 DIAGNOSIS — N400 Enlarged prostate without lower urinary tract symptoms: Secondary | ICD-10-CM | POA: Diagnosis not present

## 2015-03-21 DIAGNOSIS — Z79899 Other long term (current) drug therapy: Secondary | ICD-10-CM | POA: Diagnosis not present

## 2015-03-21 DIAGNOSIS — R0602 Shortness of breath: Secondary | ICD-10-CM | POA: Diagnosis not present

## 2015-03-21 DIAGNOSIS — I6523 Occlusion and stenosis of bilateral carotid arteries: Secondary | ICD-10-CM | POA: Diagnosis present

## 2015-03-21 DIAGNOSIS — F17210 Nicotine dependence, cigarettes, uncomplicated: Secondary | ICD-10-CM | POA: Diagnosis present

## 2015-03-21 DIAGNOSIS — F17200 Nicotine dependence, unspecified, uncomplicated: Secondary | ICD-10-CM | POA: Diagnosis present

## 2015-03-21 DIAGNOSIS — I2510 Atherosclerotic heart disease of native coronary artery without angina pectoris: Secondary | ICD-10-CM | POA: Diagnosis present

## 2015-03-21 DIAGNOSIS — J449 Chronic obstructive pulmonary disease, unspecified: Secondary | ICD-10-CM | POA: Diagnosis present

## 2015-03-21 DIAGNOSIS — R61 Generalized hyperhidrosis: Secondary | ICD-10-CM | POA: Diagnosis not present

## 2015-03-21 DIAGNOSIS — R1310 Dysphagia, unspecified: Secondary | ICD-10-CM

## 2015-03-21 DIAGNOSIS — Z8249 Family history of ischemic heart disease and other diseases of the circulatory system: Secondary | ICD-10-CM

## 2015-03-21 DIAGNOSIS — N183 Chronic kidney disease, stage 3 unspecified: Secondary | ICD-10-CM | POA: Diagnosis present

## 2015-03-21 DIAGNOSIS — Z86711 Personal history of pulmonary embolism: Secondary | ICD-10-CM

## 2015-03-21 DIAGNOSIS — I10 Essential (primary) hypertension: Secondary | ICD-10-CM | POA: Diagnosis present

## 2015-03-21 DIAGNOSIS — Z86718 Personal history of other venous thrombosis and embolism: Secondary | ICD-10-CM | POA: Diagnosis not present

## 2015-03-21 DIAGNOSIS — Z951 Presence of aortocoronary bypass graft: Secondary | ICD-10-CM | POA: Diagnosis not present

## 2015-03-21 DIAGNOSIS — E785 Hyperlipidemia, unspecified: Secondary | ICD-10-CM | POA: Diagnosis present

## 2015-03-21 DIAGNOSIS — K219 Gastro-esophageal reflux disease without esophagitis: Secondary | ICD-10-CM | POA: Diagnosis present

## 2015-03-21 DIAGNOSIS — R072 Precordial pain: Secondary | ICD-10-CM | POA: Diagnosis not present

## 2015-03-21 DIAGNOSIS — I69391 Dysphagia following cerebral infarction: Secondary | ICD-10-CM

## 2015-03-21 DIAGNOSIS — I739 Peripheral vascular disease, unspecified: Secondary | ICD-10-CM | POA: Diagnosis present

## 2015-03-21 DIAGNOSIS — I252 Old myocardial infarction: Secondary | ICD-10-CM | POA: Diagnosis not present

## 2015-03-21 DIAGNOSIS — I6529 Occlusion and stenosis of unspecified carotid artery: Secondary | ICD-10-CM | POA: Diagnosis present

## 2015-03-21 HISTORY — DX: Essential (primary) hypertension: I10

## 2015-03-21 HISTORY — DX: Chronic kidney disease, stage 3 unspecified: N18.30

## 2015-03-21 HISTORY — DX: Chronic kidney disease, stage 3 (moderate): N18.3

## 2015-03-21 LAB — CBC WITH DIFFERENTIAL/PLATELET
Basophils Absolute: 0 10*3/uL (ref 0.0–0.1)
Basophils Relative: 0 %
Eosinophils Absolute: 0.2 10*3/uL (ref 0.0–0.7)
Eosinophils Relative: 2 %
HCT: 38.1 % — ABNORMAL LOW (ref 39.0–52.0)
Hemoglobin: 12.8 g/dL — ABNORMAL LOW (ref 13.0–17.0)
Lymphocytes Relative: 20 %
Lymphs Abs: 1.9 10*3/uL (ref 0.7–4.0)
MCH: 32.7 pg (ref 26.0–34.0)
MCHC: 33.6 g/dL (ref 30.0–36.0)
MCV: 97.4 fL (ref 78.0–100.0)
Monocytes Absolute: 0.9 10*3/uL (ref 0.1–1.0)
Monocytes Relative: 9 %
Neutro Abs: 6.5 10*3/uL (ref 1.7–7.7)
Neutrophils Relative %: 69 %
Platelets: 207 10*3/uL (ref 150–400)
RBC: 3.91 MIL/uL — ABNORMAL LOW (ref 4.22–5.81)
RDW: 15.3 % (ref 11.5–15.5)
WBC: 9.5 10*3/uL (ref 4.0–10.5)

## 2015-03-21 LAB — LIPID PANEL
Cholesterol: 74 mg/dL (ref 0–200)
HDL: 29 mg/dL — ABNORMAL LOW
LDL Cholesterol: 35 mg/dL (ref 0–99)
Total CHOL/HDL Ratio: 2.6 ratio
Triglycerides: 50 mg/dL
VLDL: 10 mg/dL (ref 0–40)

## 2015-03-21 LAB — CBC
HCT: 38.6 % — ABNORMAL LOW (ref 39.0–52.0)
Hemoglobin: 12.7 g/dL — ABNORMAL LOW (ref 13.0–17.0)
MCH: 32.1 pg (ref 26.0–34.0)
MCHC: 32.9 g/dL (ref 30.0–36.0)
MCV: 97.5 fL (ref 78.0–100.0)
Platelets: 218 10*3/uL (ref 150–400)
RBC: 3.96 MIL/uL — ABNORMAL LOW (ref 4.22–5.81)
RDW: 15.2 % (ref 11.5–15.5)
WBC: 8.5 10*3/uL (ref 4.0–10.5)

## 2015-03-21 LAB — URINALYSIS, ROUTINE W REFLEX MICROSCOPIC
Glucose, UA: NEGATIVE mg/dL
Hgb urine dipstick: NEGATIVE
Ketones, ur: NEGATIVE mg/dL
Leukocytes, UA: NEGATIVE
Nitrite: NEGATIVE
Protein, ur: NEGATIVE mg/dL
Specific Gravity, Urine: 1.026 (ref 1.005–1.030)
pH: 6 (ref 5.0–8.0)

## 2015-03-21 LAB — BASIC METABOLIC PANEL
Anion gap: 12 (ref 5–15)
Anion gap: 11 (ref 5–15)
BUN: 11 mg/dL (ref 6–20)
BUN: 11 mg/dL (ref 6–20)
CO2: 25 mmol/L (ref 22–32)
CO2: 29 mmol/L (ref 22–32)
Calcium: 8.9 mg/dL (ref 8.9–10.3)
Calcium: 9.2 mg/dL (ref 8.9–10.3)
Chloride: 106 mmol/L (ref 101–111)
Chloride: 104 mmol/L (ref 101–111)
Creatinine, Ser: 1.58 mg/dL — ABNORMAL HIGH (ref 0.61–1.24)
Creatinine, Ser: 1.55 mg/dL — ABNORMAL HIGH (ref 0.61–1.24)
GFR calc Af Amer: 47 mL/min — ABNORMAL LOW (ref >60)
GFR calc Af Amer: 48 mL/min — ABNORMAL LOW (ref >60)
GFR calc non Af Amer: 41 mL/min — ABNORMAL LOW (ref >60)
GFR calc non Af Amer: 42 mL/min — ABNORMAL LOW (ref >60)
Glucose, Bld: 99 mg/dL (ref 65–99)
Glucose, Bld: 108 mg/dL — ABNORMAL HIGH (ref 65–99)
Potassium: 3.9 mmol/L (ref 3.5–5.1)
Potassium: 3.8 mmol/L (ref 3.5–5.1)
Sodium: 143 mmol/L (ref 135–145)
Sodium: 144 mmol/L (ref 135–145)

## 2015-03-21 LAB — RAPID URINE DRUG SCREEN, HOSP PERFORMED
Amphetamines: NOT DETECTED
Barbiturates: NOT DETECTED
Benzodiazepines: POSITIVE — AB
Cocaine: NOT DETECTED
Opiates: POSITIVE — AB
Tetrahydrocannabinol: NOT DETECTED

## 2015-03-21 LAB — TROPONIN I
Troponin I: <0.03 ng/mL
Troponin I: <0.03 ng/mL

## 2015-03-21 LAB — PROTIME-INR
INR: 1.63 — ABNORMAL HIGH (ref 0.00–1.49)
Prothrombin Time: 19.4 seconds — ABNORMAL HIGH (ref 11.6–15.2)

## 2015-03-21 LAB — APTT: aPTT: 43 seconds — ABNORMAL HIGH (ref 24–37)

## 2015-03-21 LAB — BRAIN NATRIURETIC PEPTIDE: B Natriuretic Peptide: 70.2 pg/mL (ref 0.0–100.0)

## 2015-03-21 LAB — CBG MONITORING, ED: Glucose-Capillary: 81 mg/dL (ref 65–99)

## 2015-03-21 LAB — I-STAT TROPONIN, ED: Troponin i, poc: 0.02 ng/mL (ref 0.00–0.08)

## 2015-03-21 MED ORDER — ASPIRIN 81 MG PO CHEW: 81.0000 mg | CHEWABLE_TABLET | Freq: Every day | ORAL | Status: DC

## 2015-03-21 MED ORDER — ASPIRIN 81 MG PO CHEW: 324.0000 mg | CHEWABLE_TABLET | Freq: Once | ORAL | Status: DC

## 2015-03-21 MED ORDER — ISOSORBIDE MONONITRATE ER 30 MG PO TB24
30.0000 mg | ORAL_TABLET | Freq: Every day | ORAL | Status: DC
  Administered 2015-03-21 – 2015-03-22 (×2): 30 mg via ORAL
  Filled 2015-03-21 (×2): qty 1

## 2015-03-21 MED ORDER — LISINOPRIL 20 MG PO TABS
20.0000 mg | ORAL_TABLET | Freq: Every day | ORAL | Status: DC
  Administered 2015-03-22: 20 mg via ORAL
  Filled 2015-03-21 (×2): qty 1

## 2015-03-21 MED ORDER — ACETAMINOPHEN 325 MG PO TABS
650.0000 mg | ORAL_TABLET | ORAL | Status: DC | PRN
  Administered 2015-03-22 (×2): 650 mg via ORAL
  Filled 2015-03-21 (×2): qty 2

## 2015-03-21 MED ORDER — HYDROCODONE-ACETAMINOPHEN 7.5-325 MG PO TABS
1.0000 | ORAL_TABLET | Freq: Four times a day (QID) | ORAL | Status: DC | PRN
  Administered 2015-03-21 – 2015-03-22 (×4): 1 via ORAL
  Filled 2015-03-21 (×4): qty 1

## 2015-03-21 MED ORDER — FINASTERIDE 5 MG PO TABS
5.0000 mg | ORAL_TABLET | Freq: Every day | ORAL | Status: DC
  Administered 2015-03-21 – 2015-03-22 (×2): 5 mg via ORAL
  Filled 2015-03-21 (×2): qty 1

## 2015-03-21 MED ORDER — PANTOPRAZOLE SODIUM 40 MG PO TBEC
40.0000 mg | DELAYED_RELEASE_TABLET | Freq: Every day | ORAL | Status: DC
  Administered 2015-03-21 – 2015-03-22 (×2): 40 mg via ORAL
  Filled 2015-03-21 (×2): qty 1

## 2015-03-21 MED ORDER — ONDANSETRON HCL 4 MG/2ML IJ SOLN: 4.0000 mg | Freq: Four times a day (QID) | INTRAMUSCULAR | Status: DC | PRN

## 2015-03-21 MED ORDER — NICOTINE 14 MG/24HR TD PT24
14.0000 mg | MEDICATED_PATCH | Freq: Every day | TRANSDERMAL | Status: DC
  Administered 2015-03-22: 14 mg via TRANSDERMAL
  Filled 2015-03-21 (×2): qty 1

## 2015-03-21 MED ORDER — SERTRALINE HCL 100 MG PO TABS
100.0000 mg | ORAL_TABLET | Freq: Every day | ORAL | Status: DC
  Administered 2015-03-21: 100 mg via ORAL
  Filled 2015-03-21: qty 2
  Filled 2015-03-21: qty 1

## 2015-03-21 MED ORDER — SIMVASTATIN 40 MG PO TABS
40.0000 mg | ORAL_TABLET | Freq: Every day | ORAL | Status: DC
  Administered 2015-03-21: 40 mg via ORAL
  Filled 2015-03-21 (×2): qty 1

## 2015-03-21 MED ORDER — DIAZEPAM 5 MG PO TABS
7.5000 mg | ORAL_TABLET | Freq: Every day | ORAL | Status: DC
  Administered 2015-03-21 – 2015-03-22 (×2): 7.5 mg via ORAL
  Filled 2015-03-21 (×2): qty 2

## 2015-03-21 MED ORDER — MORPHINE SULFATE (PF) 2 MG/ML IV SOLN: 2.0000 mg | INTRAVENOUS | Status: DC | PRN

## 2015-03-21 MED ORDER — APIXABAN 5 MG PO TABS
5.0000 mg | ORAL_TABLET | Freq: Two times a day (BID) | ORAL | Status: DC
  Administered 2015-03-21 – 2015-03-22 (×3): 5 mg via ORAL
  Filled 2015-03-21 (×5): qty 1

## 2015-03-21 MED ORDER — NITROGLYCERIN 0.4 MG SL SUBL: 0.4000 mg | SUBLINGUAL_TABLET | SUBLINGUAL | Status: DC | PRN

## 2015-03-21 MED ORDER — RISPERIDONE 0.5 MG PO TABS
0.5000 mg | ORAL_TABLET | Freq: Every day | ORAL | Status: DC
  Administered 2015-03-21: 0.5 mg via ORAL
  Filled 2015-03-21 (×3): qty 1

## 2015-03-21 NOTE — ED Notes (Signed)
CBG 81.  

## 2015-03-21 NOTE — H&P (Addendum)
[facility] History and Physical  [name] [id number] DOB: 1940-04-19 DOA: 03/21/2015  Referring physician: ED physician PCP: Marton Redwood, MD  Specialists:   Chief Complaint: Chest pain  HPI: [name] is a 75 y.o. male with PMH of COPD, GERD, depression, CAD, s/p of BMS, tobacco abuse, stroke, dysphagia from previous stroke, PVD, chronic kidney disease-3, atrial fibrillation on Eliquis, DVT, who presents with chest pain.  Patient reports that he started having chest pain at about 7 PM last night. It is associated with shortness of breath, no cough, fever or chills. His chest pain is located in the left side of chest, intermittent, 5 out of 10 in severity, nonradiating. Patient does not have tenderness over calf areas. Patient does not have abdominal pain, diarrhea, symptoms of UTI, unilateral weakness. Patient has chronic dysphagia from previous stroke. Currently patient is chest pain-free.  In ED, patient was found to have negative troponin, WBC 9.5, temperature normal, no tachycardia, stable renal function. Chest x-ray showed shallow inspiration with elevation of left hemidiaphragm, atelectasis or infiltration in the left lung base.  EKG: Independently reviewed. QTC 445, atrial fibrillation, occasional PVC.  Where does patient live?   At home    Can patient participate in ADLs?  Barely  Review of Systems:   General: no fevers, chills, no changes in body weight, has poor appetite, has fatigue HEENT: no blurry vision, hearing changes or sore throat Pulm: has dyspnea, no coughing, wheezing CV: has chest pain, no palpitations Abd: no nausea, vomiting, abdominal pain, diarrhea, constipation GU: no dysuria, burning on urination, increased urinary frequency, hematuria  Ext: no leg edema Neuro: no unilateral weakness, numbness, or tingling, no vision change or hearing loss Skin: no rash MSK: No muscle spasm, no deformity, no limitation of range of movement in  spin Heme: No easy bruising.  Travel history: No recent long distant travel.  Allergy:  Allergies  Allergen Reactions  . Hctz [Hydrochlorothiazide] Anaphylaxis and Other (See Comments)    Throat Swelling  . Amitriptyline Other (See Comments)    Made me go crazy  . Gabapentin Other (See Comments)    Made me go crazy  . Metoprolol Tartrate Other (See Comments)    fatigue  . Paroxetine Other (See Comments)    Made me go crazy  . Spiriva Handihaler [Tiotropium Bromide Monohydrate] Other (See Comments)    Urinary retention    Past Medical History  Diagnosis Date  . 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 Hypertension   . Bradycardia   . Dyslipidemia   . Tobacco abuse   . CVA (cerebral vascular accident) ([place])   . History of benign prostatic hypertrophy   . GERD (gastroesophageal reflux disease)   . Anxiety   . History of leukocytosis   . Neuropathy ([place])   . COPD (chronic obstructive pulmonary disease) ([place])   . Peripheral vascular disease ([place])   . 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 MI, old   . Foot pain   . DVT (deep venous thrombosis) ([place])   . Carotid artery occlusion   . Atrial fibrillation ([place])   . Depression   . Colon polyps   . Common bile duct stone   . Cataract     bilataeral cateracts removed  . Neuromuscular disorder (HCC)     neuropathy  . CKD (chronic kidney disease), stage III   . Essential hypertension  Past Surgical History  Procedure Laterality Date  . Intervention of the rca      using a single BMS  . Ankle surgery    . Bypass graft  2009 or  2010    stent  . Cholecystectomy      Gall Bladder  . Colonoscopy      Social History:  reports that he has been smoking Cigarettes and E-cigarettes.  He started smoking about 2 years ago. He has been smoking about 0.50 packs per day. He quit smokeless tobacco use about 2 years ago. He reports that he does not drink alcohol or use illicit  drugs.  Family History:  Family History  Problem Relation Age of Onset  . Stroke Mother   . Hypertension Mother   . Heart attack Mother   . Heart attack Brother   . Cancer Brother   . Heart disease Brother     AAA  . Hypertension Brother   . Heart disease Brother   . Hypertension Father   . Heart attack Father   . Colon cancer Neg Hx   . Esophageal cancer Neg Hx   . Rectal cancer Neg Hx   . Stomach cancer Neg Hx      Prior to Admission medications   Medication Sig Start Date End Date Taking? Authorizing Provider  apixaban (ELIQUIS) 5 MG TABS tablet TAKE 1 TABLET (5 MG TOTAL) BY MOUTH 2 (TWO) TIMES DAILY. 03/18/15  Yes Sherren Mocha, MD  diazepam (VALIUM) 5 MG tablet Take 7.5 mg by mouth daily.     Yes Historical Provider, MD  finasteride (PROSCAR) 5 MG tablet Take 5 mg by mouth daily.  08/17/12  Yes Historical Provider, MD  furosemide (LASIX) 20 MG tablet Take 20 mg by mouth daily.  07/11/13  Yes Historical Provider, MD  HYDROcodone-acetaminophen (NORCO) 7.5-325 MG tablet Take 1 tablet by mouth every 6 (six) hours as needed. for pain 03/08/15  Yes Historical Provider, MD  isosorbide mononitrate (IMDUR) 30 MG 24 hr tablet TAKE 1 TABLET BY MOUTH EVERY DAY 12/12/14  Yes Sherren Mocha, MD  lisinopril (PRINIVIL,ZESTRIL) 10 MG tablet Take 20 mg by mouth daily.    Yes Historical Provider, MD  NITROSTAT 0.4 MG SL tablet PLACE ONE TABLET UNDER TONGUE AS NEEDED 12/13/13  Yes Sherren Mocha, MD  omeprazole (PRILOSEC) 40 MG capsule Take 1 capsule (40 mg total) by mouth daily. 03/13/14  Yes Jerene Bears, MD  potassium chloride SA (KLOR-CON M20) 20 MEQ tablet TAKE 1 TABLET (20 MEQ TOTAL) BY MOUTH DAILY. 02/08/15  Yes Liliane Shi, PA-C  risperiDONE (RISPERDAL) 0.5 MG tablet Take 0.5 mg by mouth daily.     Yes Historical Provider, MD  sertraline (ZOLOFT) 100 MG tablet Take 100 mg by mouth daily. 03/13/15  Yes Historical Provider, MD  simvastatin (ZOCOR) 40 MG tablet TAKE 1 TABLET BY MOUTH AT  BEDTIME 03/06/15  Yes Sherren Mocha, MD  lubiprostone (AMITIZA) 8 MCG capsule Take 1 capsule (8 mcg total) by mouth 2 (two) times daily with a meal. Patient not taking: Reported on 03/21/2015 11/13/14   Loralie Champagne, PA-C    Physical Exam: Filed Vitals:   03/21/15 0330 03/21/15 0345 03/21/15 0400 03/21/15 0415  BP: 128/73 123/63 128/64 116/71  Pulse: 83  68 61  Temp:      TempSrc:      Resp: 14 17 14 16   Height:      Weight:      SpO2: 98% 97% 98% 97%  General: Not in acute distress HEENT:       Eyes: PERRL, EOMI, no scleral icterus.       ENT: No discharge from the ears and nose, no pharynx injection, no tonsillar enlargement.        Neck: No JVD, no bruit, no mass felt. Heme: No neck lymph node enlargement. Cardiac: S1/S2, RRR, No murmurs, No gallops or rubs. Pulm: No rales, wheezing, rhonchi or rubs. Abd: Soft, nondistended, nontender, no rebound pain, no organomegaly, BS present. Ext: No pitting leg edema bilaterally. 2+DP/PT pulse bilaterally. Musculoskeletal: No joint deformities, No joint redness or warmth, no limitation of ROM in spin. Skin: No rashes.  Neuro: Alert, oriented X3, cranial nerves II-XII grossly intact, moves all extremities. Psych: Patient is not psychotic, no suicidal or hemocidal ideation.  Labs on Admission:  Basic Metabolic Panel:  Recent Labs Lab 03/21/15 0230  NA 143  K 3.9  CL 106  CO2 25  GLUCOSE 99  BUN 11  CREATININE 1.58*  CALCIUM 8.9   Liver Function Tests: No results for input(s): AST, ALT, ALKPHOS, BILITOT, PROT, ALBUMIN in the last 168 hours. No results for input(s): LIPASE, AMYLASE in the last 168 hours. No results for input(s): AMMONIA in the last 168 hours. CBC:  Recent Labs Lab 03/21/15 0230  WBC 9.5  NEUTROABS 6.5  HGB 12.8*  HCT 38.1*  MCV 97.4  PLT 207   Cardiac Enzymes: No results for input(s): CKTOTAL, CKMB, CKMBINDEX, TROPONINI in the last 168 hours.  BNP (last 3 results) No results for input(s): BNP  in the last 8760 hours.  ProBNP (last 3 results)  Recent Labs  08/01/14 0859  PROBNP 128.0*    CBG: No results for input(s): GLUCAP in the last 168 hours.  Radiological Exams on Admission: Dg Chest Portable 1 View  03/21/2015  CLINICAL DATA:  Chest pain tonight. EXAM: PORTABLE CHEST 1 VIEW COMPARISON:  12/06/2010 FINDINGS: Shallow inspiration with elevation of the left hemidiaphragm. Atelectasis or infiltration demonstrated in the left lung base. Heart size and pulmonary vascularity are normal. No blunting of costophrenic angles. No pneumothorax. Calcification of the aorta. IMPRESSION: Shallow inspiration with elevation of left hemidiaphragm. Atelectasis or infiltration in the left lung base. Electronically Signed   By: Lucienne Capers M.D.   On: 03/21/2015 02:19    Assessment/Plan Principal Problem:   Chest pain Active Problems:   HLD (hyperlipidemia)   Anxiety state   TOBACCO ABUSE   Essential hypertension   Coronary atherosclerosis   GASTROESOPHAGEAL REFLUX DISEASE   BPH (benign prostatic hyperplasia)   Carotid stenosis   Chronic atrial fibrillation (HCC)   CKD (chronic kidney disease), stage III   Dysphagia   Chest pain: No signs of DVT, and patient is on Eliquis, likely to have pulmonary embolism. No signs of infection or cough, less likely to have pneumonia. Patient has significant risk factors, including previous stent placement, old age, hyperlipidemia and hypertension, will admit for chest pain rule out. Currently chest pain-free, initial troponin is negative.  - will admit to Tele bed  - cycle CE q6 x3 and repeat her EKG in the am  - Nitroglycerin, Morphine, Zocor and Imdur - on Eliquis - Risk factor stratification: will check FLP and A1C  - Consider cardiology consult if test positive for CEs  - 2d echo - NPO  CAD: s/p of BMS. -as above  HLD: Last LDL was 42 on 12/13/13 -Continue home medications: Zocor -Check FLP  Hypertension: -Continue  lisinopril -Hold Lasix when patient  is on nothing by mouth  Depression and anxiety: Stable, no suicidal or homicidal ideations. -Continue home medications: Zoloft, risperidone, Valium  Tobacco abuse: -Did counseling about importance of quitting smoking -Nicotine patch  GERD: -Protonix  BPH: stable - Continue Proscar  Atrial Fibrillation: CHA2DS2-VASc Score is 6, needs oral anticoagulation. Patient is Eliquis at home. INR is pending on admission. Heart rate is well controlled without BB or CCB. -continue Eliquis  CKD (chronic kidney disease), stage III: stable. Baseline creatinine 1.5-1.7. His creatinine is 1.58, BUN 11. -Follow-up renal function by BMP  Dysphagia due to previous stroke. -SLP  DVT ppx: On Eliquis  Code Status: Partial code (OK with CPR, but not intubation) Family Communication:  Yes, patient's daughter at bed side Disposition Plan: Admit to inpatient   Date of Service 03/21/2015    Ivor Costa [facility] Pager [phone]  If 7PM-7AM, please contact night-coverage [email or web address] Password [email or web address] 03/21/2015, 4:53 AM

## 2015-03-21 NOTE — ED Notes (Signed)
Hospital bed being ordered.

## 2015-03-21 NOTE — ED Notes (Signed)
Called ECHO stated patient will have ECHO completed tomorrow. Paged admitting Doctor for diet order.

## 2015-03-21 NOTE — ED Notes (Signed)
Dr. Wickline at the bedside.  

## 2015-03-21 NOTE — ED Notes (Signed)
Bladder scan revealed 158mL of urine retained. Patient reports typically at home it takes a while for urine stream to start when urge is present. No urge to void at this time.

## 2015-03-21 NOTE — ED Provider Notes (Signed)
CSN: [id number]     Arrival date & time 03/21/15  0148 History  By signing my name below, I, Altamease Oiler, attest that this documentation has been prepared under the direction and in the presence of Ripley Fraise, MD. Electronically Signed: Altamease Oiler, ED Scribe. 03/21/2015. 3:07 AM    Chief Complaint  Patient presents with  . Chest Pain    Patient is a 75 y.o. male presenting with chest pain. The history is provided by the patient and a relative. No language interpreter was used.  Chest Pain Pain location:  L chest Pain quality: sharp   Pain radiates to:  Does not radiate Pain radiates to the back: no   Onset quality:  Sudden Duration:  8 hours Timing:  Intermittent Progression:  Resolved Context: at rest   Relieved by:  Nothing Worsened by:  Nothing tried Ineffective treatments:  None tried Associated symptoms: diaphoresis and shortness of breath   Associated symptoms: no cough and not vomiting   Risk factors: coronary artery disease, hypertension, male sex, prior DVT/PE and smoking    Brought in by EMS, [name] is a 75 y.o. male with history of MI, HTN, dyslipidemia, a-fib on eliquis, and DVT who presents to the Emergency Department complaining of intermittent, sharp, non-radiating, left-sided chest pain with onset near 7 PM last night while sitting on the couch. The episodes of pain last for 30-45 minute, he is pain free at present. Pt notes that he has had similar pain in the past. Associated symptoms include sweating and SOB. Pt denies cough, fever, vomiting, abdominal pain, and LE pain. Aspirin given by EMS.   Past Medical History  Diagnosis Date  . 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 Hypertension   . Bradycardia   . Dyslipidemia   . Tobacco abuse   . CVA (cerebral vascular accident) ([place])   . History of benign prostatic hypertrophy   . GERD (gastroesophageal reflux disease)   . Anxiety   . History  of leukocytosis   . Neuropathy ([place])   . COPD (chronic obstructive pulmonary disease) ([place])   . Peripheral vascular disease ([place])   . 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 MI, old   . Foot pain   . DVT (deep venous thrombosis) ([place])   . Carotid artery occlusion   . Atrial fibrillation ([place])   . Depression   . Colon polyps   . Common bile duct stone   . Cataract     bilataeral cateracts removed  . Neuromuscular disorder ([place])     neuropathy   Past Surgical History  Procedure Laterality Date  . Intervention of the rca      using a single BMS  . Ankle surgery    . Bypass graft  2009 or  2010    stent  . Cholecystectomy      Gall Bladder  . Colonoscopy     Family History  Problem Relation Age of Onset  . Stroke Mother   . Hypertension Mother   . Heart attack Mother   . Heart attack Brother   . Cancer Brother   . Heart disease Brother     AAA  . Hypertension Brother   . Heart disease Brother   . Hypertension Father   . Heart attack Father   . Colon cancer Neg Hx   . Esophageal cancer Neg Hx   . Rectal cancer Neg Hx   .  Stomach cancer Neg Hx    Social History  Substance Use Topics  . Smoking status: Current Every Day Smoker -- 0.50 packs/day    Types: Cigarettes, E-cigarettes    Start date: 07/20/2012    Last Attempt to Quit: 09/13/2012  . Smokeless tobacco: Former Systems developer    Quit date: 09/13/2012     Comment: currently using e-cigarettes and regular cigarettes.  . Alcohol Use: No    Review of Systems  Constitutional: Positive for diaphoresis.  Respiratory: Positive for shortness of breath. Negative for cough.   Cardiovascular: Positive for chest pain.  Gastrointestinal: Negative for vomiting.  All other systems reviewed and are negative.   Allergies  Hctz; Amitriptyline; Gabapentin; Metoprolol tartrate; Paroxetine; and Spiriva handihaler  Home Medications   Prior to Admission medications   Medication Sig Start Date End Date Taking? Authorizing  Provider  apixaban (ELIQUIS) 5 MG TABS tablet TAKE 1 TABLET (5 MG TOTAL) BY MOUTH 2 (TWO) TIMES DAILY. 03/18/15  Yes Sherren Mocha, MD  diazepam (VALIUM) 5 MG tablet Take 7.5 mg by mouth daily.     Yes Historical Provider, MD  finasteride (PROSCAR) 5 MG tablet Take 5 mg by mouth daily.  08/17/12  Yes Historical Provider, MD  furosemide (LASIX) 20 MG tablet Take 20 mg by mouth daily.  07/11/13  Yes Historical Provider, MD  HYDROcodone-acetaminophen (NORCO) 7.5-325 MG tablet Take 1 tablet by mouth every 6 (six) hours as needed. for pain 03/08/15  Yes Historical Provider, MD  isosorbide mononitrate (IMDUR) 30 MG 24 hr tablet TAKE 1 TABLET BY MOUTH EVERY DAY 12/12/14  Yes Sherren Mocha, MD  lisinopril (PRINIVIL,ZESTRIL) 10 MG tablet Take 20 mg by mouth daily.    Yes Historical Provider, MD  NITROSTAT 0.4 MG SL tablet PLACE ONE TABLET UNDER TONGUE AS NEEDED 12/13/13  Yes Sherren Mocha, MD  omeprazole (PRILOSEC) 40 MG capsule Take 1 capsule (40 mg total) by mouth daily. 03/13/14  Yes Jerene Bears, MD  potassium chloride SA (KLOR-CON M20) 20 MEQ tablet TAKE 1 TABLET (20 MEQ TOTAL) BY MOUTH DAILY. 02/08/15  Yes Liliane Shi, PA-C  risperiDONE (RISPERDAL) 0.5 MG tablet Take 0.5 mg by mouth daily.     Yes Historical Provider, MD  sertraline (ZOLOFT) 100 MG tablet Take 100 mg by mouth daily. 03/13/15  Yes Historical Provider, MD  simvastatin (ZOCOR) 40 MG tablet TAKE 1 TABLET BY MOUTH AT BEDTIME 03/06/15  Yes Sherren Mocha, MD  lubiprostone (AMITIZA) 8 MCG capsule Take 1 capsule (8 mcg total) by mouth 2 (two) times daily with a meal. Patient not taking: Reported on 03/21/2015 11/13/14   Janett Billow D Zehr, PA-C   BP 135/84 mmHg  Pulse 74  Temp(Src) 98 F (36.7 C) (Oral)  Resp 12  Ht 6\' 1"  (1.854 m)  Wt 200 lb (90.719 kg)  BMI 26.39 kg/m2  SpO2 97% Physical Exam CONSTITUTIONAL: Well developed/well nourished HEAD: Normocephalic/atraumatic EYES: EOMI/PERRL ENMT: Mucous membranes moist NECK: supple no  meningeal signs SPINE/BACK:entire spine nontender CV: S1/S2 noted, irregular LUNGS: Lungs are clear to auscultation bilaterally, no apparent distress ABDOMEN: soft, nontender, no rebound or guarding, bowel sounds noted throughout abdomen GU:no cva tenderness NEURO: Pt is awake/alert/appropriate, moves all extremitiesx4.    EXTREMITIES: pulses normal/equal, full ROM SKIN: warm, color normal PSYCH: no abnormalities of mood noted, alert and oriented to situation  ED Course  Procedures  DIAGNOSTIC STUDIES: Oxygen Saturation is 97% on RA,  normal by my interpretation.    COORDINATION OF CARE: 3:06 AM Discussed  treatment plan which includes lab work, CXR, EKG, and admission to the hospital with pt at bedside and pt agreed to plan. 4:13 AM Pt is CP free He is otherwise stable However given strong h/o CAD with significant episode of CP/SOB earlier tonight, advised admission Pt agreeable Currently without tachycardia/hypoxia or active pain, doubt PE/Dissection at this time D/w dr Blaine Hamper will admit to tele for ACS rule out  Labs Review Labs Reviewed  BASIC METABOLIC PANEL - Abnormal; Notable for the following:    Creatinine, Ser 1.58 (*)    GFR calc non Af Amer 41 (*)    GFR calc Af Amer 47 (*)    All other components within normal limits  CBC WITH DIFFERENTIAL/PLATELET - Abnormal; Notable for the following:    RBC 3.91 (*)    Hemoglobin 12.8 (*)    HCT 38.1 (*)    All other components within normal limits  I-STAT TROPOININ, ED    Imaging Review Dg Chest Portable 1 View  03/21/2015  CLINICAL DATA:  Chest pain tonight. EXAM: PORTABLE CHEST 1 VIEW COMPARISON:  12/06/2010 FINDINGS: Shallow inspiration with elevation of the left hemidiaphragm. Atelectasis or infiltration demonstrated in the left lung base. Heart size and pulmonary vascularity are normal. No blunting of costophrenic angles. No pneumothorax. Calcification of the aorta. IMPRESSION: Shallow inspiration with elevation of left  hemidiaphragm. Atelectasis or infiltration in the left lung base. Electronically Signed   By: Lucienne Capers M.D.   On: 03/21/2015 02:19   I have personally reviewed and evaluated these lab results as part of my medical decision-making.   EKG Interpretation   Date/Time:  Thursday March 21 2015 01:56:45 EST Ventricular Rate:  77 PR Interval:    QRS Duration: 96 QT Interval:  393 QTC Calculation: 445 R Axis:   67 Text Interpretation:  Atrial fibrillation Low voltage, precordial leads  Abnormal ekg afib is new from prior Confirmed by Christy Gentles  MD, Elenore Rota  [phone]) on 03/21/2015 1:59:17 AM     Medications  aspirin chewable tablet 324 mg (0 mg Oral Hold 03/21/15 0401)    MDM   Final diagnoses:  Chest pain, rule out acute myocardial infarction    Nursing notes including past medical history and social history reviewed and considered in documentation Labs/vital reviewed myself and considered during evaluation   I personally performed the services described in this documentation, which was scribed in my presence. The recorded information has been reviewed and is accurate.       Ripley Fraise, MD 03/21/15 [phone]

## 2015-03-21 NOTE — ED Notes (Signed)
Attempted to void, unsuccessful.

## 2015-03-21 NOTE — ED Notes (Signed)
Patient is NPO at this time. 

## 2015-03-21 NOTE — ED Notes (Signed)
Hospital Bed ordered @ [phone].

## 2015-03-21 NOTE — ED Notes (Signed)
Per EMS, patient started having chest pain 6 hours ago, 4 nitro prior to arrival. 324mg  of aspirin given by ems. afib on the monitor. VS with ems: bp 137/82.

## 2015-03-21 NOTE — Progress Notes (Signed)
[facility]                                                                              Patient Demographics  [name], is a 75 y.o. male, DOB - Aug 21, 1940, [id number]  Admit date - 03/21/2015   Admitting Physician Lorretta Harp, MD  Outpatient Primary MD for the patient is Martha Clan, MD  LOS - 0   Chief Complaint  Patient presents with  . Chest Pain      HPI on 03/21/2015 by Dr. Alois Cliche is a 75 y.o. male with PMH of COPD, GERD, depression, CAD, s/p of BMS, tobacco abuse, stroke, dysphagia from previous stroke, PVD, chronic kidney disease-3, atrial fibrillation on Eliquis, DVT, who presents with chest pain.  Patient reports that he started having chest pain at about 7 PM last night. It is associated with shortness of breath, no cough, fever or chills. His chest pain is located in the left side of chest, intermittent, 5 out of 10 in severity, nonradiating. Patient does not have tenderness over calf areas. Patient does not have abdominal pain, diarrhea, symptoms of UTI, unilateral weakness. Patient has chronic dysphagia from previous stroke. Currently patient is chest pain-free.  In ED, patient was found to have negative troponin, WBC 9.5, temperature normal, no tachycardia, stable renal function. Chest x-ray showed shallow inspiration with elevation of left hemidiaphragm, atelectasis or infiltration in the left lung base.   Assessment & Plan   Patient admitted earlier today by Dr. Lorretta Harp. See H&P for details. Agree with assessment and plan.  Chest pain -Patient does have significant risk factors including history of coronary artery disease, age, hyperlipidemia and hypertension -Troponins currently cycled and negative -Pending echocardiogram -Pending hemoglobin A1c and fasting lipid panel -Continue nitroglycerin, morphine for pain, statin and Imdur were  Coronary artery disease -Treatment plan as above  Hyperlipidemia -Continue statin,  lipid panel pending  Hypertension -Continue lisinopril, Lasix held  Depression and anxiety -Continue Zoloft, Risperdal, Valium  Tobacco abuse -Discussed smoking cessation, continue nicotine patch  GERD -Continue PPI  BPH -Continue Proscar  Atrial fibrillation -CHADSVASC 6 -Continue Eliquis -Currently rate controlled  Chronic kidney disease, stage III -Currently stable, baseline creatinine 1.5-1.7 -Continue to monitor BMP  Dysphagia/history of stroke -Speech therapy consulted  Code Status: DO NOT INTUBATE  Family Communication: Bedside  Disposition Plan: Admitted, continue to monitor  Time Spent in minutes   30 minutes  Procedures  None  Consults   None  DVT Prophylaxis  Eliquis  Zyrion Coey D.O. on 03/21/2015 at 12:47 PM  Between 7am to 7pm - Pager - [phone]  After 7pm go to [email or web address] - password [email or web address]  And look for the night coverage person covering for me after hours  [facility] Office  [phone]

## 2015-03-21 NOTE — ED Notes (Signed)
Admit Doctor at bedside.  

## 2015-03-21 NOTE — ED Notes (Signed)
Dr. Donna Bernard, hospitalist, at the bedside.

## 2015-03-21 NOTE — ED Notes (Signed)
Patient transferred to hospital bed.  

## 2015-03-22 ENCOUNTER — Ambulatory Visit (HOSPITAL_BASED_OUTPATIENT_CLINIC_OR_DEPARTMENT_OTHER): Payer: Medicare Other

## 2015-03-22 ENCOUNTER — Other Ambulatory Visit: Payer: Self-pay | Admitting: Physician Assistant

## 2015-03-22 DIAGNOSIS — I482 Chronic atrial fibrillation: Secondary | ICD-10-CM | POA: Diagnosis not present

## 2015-03-22 DIAGNOSIS — E785 Hyperlipidemia, unspecified: Secondary | ICD-10-CM

## 2015-03-22 DIAGNOSIS — R079 Chest pain, unspecified: Secondary | ICD-10-CM | POA: Insufficient documentation

## 2015-03-22 DIAGNOSIS — I6523 Occlusion and stenosis of bilateral carotid arteries: Secondary | ICD-10-CM | POA: Diagnosis not present

## 2015-03-22 DIAGNOSIS — F411 Generalized anxiety disorder: Secondary | ICD-10-CM

## 2015-03-22 DIAGNOSIS — R072 Precordial pain: Secondary | ICD-10-CM | POA: Diagnosis not present

## 2015-03-22 DIAGNOSIS — N400 Enlarged prostate without lower urinary tract symptoms: Secondary | ICD-10-CM | POA: Diagnosis not present

## 2015-03-22 DIAGNOSIS — I252 Old myocardial infarction: Secondary | ICD-10-CM | POA: Diagnosis not present

## 2015-03-22 DIAGNOSIS — K219 Gastro-esophageal reflux disease without esophagitis: Secondary | ICD-10-CM

## 2015-03-22 DIAGNOSIS — J449 Chronic obstructive pulmonary disease, unspecified: Secondary | ICD-10-CM | POA: Diagnosis not present

## 2015-03-22 DIAGNOSIS — R0789 Other chest pain: Secondary | ICD-10-CM | POA: Diagnosis not present

## 2015-03-22 DIAGNOSIS — I10 Essential (primary) hypertension: Secondary | ICD-10-CM

## 2015-03-22 DIAGNOSIS — I2510 Atherosclerotic heart disease of native coronary artery without angina pectoris: Secondary | ICD-10-CM | POA: Diagnosis not present

## 2015-03-22 DIAGNOSIS — R1310 Dysphagia, unspecified: Secondary | ICD-10-CM

## 2015-03-22 DIAGNOSIS — F17200 Nicotine dependence, unspecified, uncomplicated: Secondary | ICD-10-CM

## 2015-03-22 DIAGNOSIS — N183 Chronic kidney disease, stage 3 (moderate): Secondary | ICD-10-CM

## 2015-03-22 LAB — HEMOGLOBIN A1C
Hgb A1c MFr Bld: 5.9 % — ABNORMAL HIGH (ref 4.8–5.6)
Mean Plasma Glucose: 123 mg/dL

## 2015-03-22 LAB — GLUCOSE, CAPILLARY: Glucose-Capillary: 99 mg/dL (ref 65–99)

## 2015-03-22 MED ORDER — NICOTINE 14 MG/24HR TD PT24: 14.0000 mg | MEDICATED_PATCH | Freq: Every day | TRANSDERMAL | Status: DC

## 2015-03-22 NOTE — Consult Note (Signed)
CARDIOLOGY CONSULT NOTE   Patient ID: [name] MRN: [id number] DOB/AGE: 09/13/1940 75 y.o.  Admit date: 03/21/2015  Primary Physician   Marton Redwood, MD Primary Cardiologist   Dr. Sherren Mocha  Reason for Consultation   Chest pain Requesting Physician Dr. Ree Kida  HPI: [name] is a 75 y.o. 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   The patient had a episode of left-sided sharp chest pain 03/20/15 while sitting. Resolved after sublingual nitroglycerin x 2. He has a chronic shortness of breath at baseline. The patient denies radiation of pain or associated symptoms like diaphoresis, nausea or vomiting. His pain again came back again last night around 7 PM yesterday however minimally resolved after sublingual nitroglycerin x 2 that lead him to ED presentation. Usually he takes nitroglycerin once every 3-4 month period. The patient denies lower extremity edema, orthopnea, PND, syncope, one-sided weakness, melena, blood in his stool, abdominal pain, nausea or vomiting. No URI symptoms.  EKG shows A. fib with PVCs. No acute abnormality. Troponin negative. Hemoglobin of 12.7. Creatinine 1.55. Urine drug screen positive for opiates and benzodiazepine. Hemoglobin A1c 5.9. BNP normal. CXR showed Shallow inspiration with elevation of left hemidiaphragm. Atelectasis or infiltration in the left lung base.    Past Medical History  Diagnosis Date  . 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 Hypertension   . Bradycardia   . Dyslipidemia   . Tobacco abuse   . CVA (cerebral vascular accident) ([place])   . History of benign prostatic hypertrophy   . GERD (gastroesophageal  reflux disease)   . Anxiety   . History of leukocytosis   . Neuropathy ([place])   . COPD (chronic obstructive pulmonary disease) ([place])   . Peripheral vascular disease ([place])   . 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 MI, old   . Foot pain   . DVT (deep venous thrombosis) ([place])   . Carotid artery occlusion   . Atrial fibrillation ([place])   . Depression   . Colon polyps   . Common bile duct stone   . Cataract     bilataeral cateracts removed  . Neuromuscular disorder (HCC)     neuropathy  . CKD (chronic kidney disease), stage III   . Essential hypertension      Past Surgical History  Procedure Laterality Date  . Intervention of the rca      using a single BMS  . Ankle surgery    . Bypass graft  2009 or  2010    stent  . Cholecystectomy      Gall Bladder  . Colonoscopy      Allergies  Allergen Reactions  . Hctz [Hydrochlorothiazide] Anaphylaxis and Other (See Comments)    Throat Swelling  . Amitriptyline Other (See Comments)    Made me go crazy  . Gabapentin Other (See Comments)    Made me go crazy  . Metoprolol Tartrate Other (See Comments)    fatigue  . Paroxetine Other (See Comments)    Made me go crazy  . Spiriva Handihaler [Tiotropium Bromide Monohydrate] Other (See Comments)    Urinary retention    I  have reviewed the patient's current medications . apixaban  5 mg Oral BID  . diazepam  7.5 mg Oral Daily  . finasteride  5 mg Oral Daily  . isosorbide mononitrate  30 mg Oral Daily  . lisinopril  20 mg Oral Daily  . nicotine  14 mg Transdermal Daily  . pantoprazole  40 mg Oral Daily  . risperiDONE  0.5 mg Oral Daily  . sertraline  100 mg Oral Daily  . simvastatin  40 mg Oral QHS     acetaminophen, HYDROcodone-acetaminophen, morphine injection, nitroGLYCERIN, ondansetron (ZOFRAN) IV  Prior to Admission medications   Medication Sig Start Date End Date Taking? Authorizing Provider  apixaban (ELIQUIS) 5 MG TABS tablet TAKE 1 TABLET (5 MG TOTAL) BY MOUTH 2  (TWO) TIMES DAILY. 03/18/15  Yes Sherren Mocha, MD  diazepam (VALIUM) 5 MG tablet Take 7.5 mg by mouth daily.     Yes Historical Provider, MD  finasteride (PROSCAR) 5 MG tablet Take 5 mg by mouth daily.  08/17/12  Yes Historical Provider, MD  furosemide (LASIX) 20 MG tablet Take 20 mg by mouth daily.  07/11/13  Yes Historical Provider, MD  HYDROcodone-acetaminophen (NORCO) 7.5-325 MG tablet Take 1 tablet by mouth every 6 (six) hours as needed. for pain 03/08/15  Yes Historical Provider, MD  isosorbide mononitrate (IMDUR) 30 MG 24 hr tablet TAKE 1 TABLET BY MOUTH EVERY DAY 12/12/14  Yes Sherren Mocha, MD  lisinopril (PRINIVIL,ZESTRIL) 10 MG tablet Take 20 mg by mouth daily.    Yes Historical Provider, MD  NITROSTAT 0.4 MG SL tablet PLACE ONE TABLET UNDER TONGUE AS NEEDED 12/13/13  Yes Sherren Mocha, MD  omeprazole (PRILOSEC) 40 MG capsule Take 1 capsule (40 mg total) by mouth daily. 03/13/14  Yes Jerene Bears, MD  potassium chloride SA (KLOR-CON M20) 20 MEQ tablet TAKE 1 TABLET (20 MEQ TOTAL) BY MOUTH DAILY. 02/08/15  Yes Liliane Shi, PA-C  risperiDONE (RISPERDAL) 0.5 MG tablet Take 0.5 mg by mouth daily.     Yes Historical Provider, MD  sertraline (ZOLOFT) 100 MG tablet Take 100 mg by mouth daily. 03/13/15  Yes Historical Provider, MD  simvastatin (ZOCOR) 40 MG tablet TAKE 1 TABLET BY MOUTH AT BEDTIME 03/06/15  Yes Sherren Mocha, MD  lubiprostone (AMITIZA) 8 MCG capsule Take 1 capsule (8 mcg total) by mouth 2 (two) times daily with a meal. Patient not taking: Reported on 03/21/2015 11/13/14   Loralie Champagne, PA-C     Social History   Social History  . Marital Status: Single    Spouse Name: N/A  . Number of Children: N/A  . Years of Education: N/A   Occupational History  . Not on file.   Social History Main Topics  . Smoking status: Current Every Day Smoker -- 0.50 packs/day    Types: Cigarettes, E-cigarettes    Start date: 07/20/2012    Last Attempt to Quit: 09/13/2012  . Smokeless  tobacco: Former Systems developer    Quit date: 09/13/2012     Comment: currently using e-cigarettes and regular cigarettes.  . Alcohol Use: No  . Drug Use: No  . Sexual Activity: Not on file   Other Topics Concern  . Not on file   Social History Narrative    Family Status  Relation Status Death Age  . Mother Deceased 67  . Brother Deceased   . Father Deceased 29    CVA   Family History  Problem Relation Age of Onset  . Stroke Mother   .  Hypertension Mother   . Heart attack Mother   . Heart attack Brother   . Cancer Brother   . Heart disease Brother     AAA  . Hypertension Brother   . Heart disease Brother   . Hypertension Father   . Heart attack Father   . Colon cancer Neg Hx   . Esophageal cancer Neg Hx   . Rectal cancer Neg Hx   . Stomach cancer Neg Hx      ROS:  Full 14 point review of systems complete and found to be negative unless listed above.  Physical Exam: Blood pressure 130/60, pulse 110, temperature 98.3 F (36.8 C), temperature source Oral, resp. rate 17, height 5\' 10"  (1.778 m), weight 193 lb 12.8 oz (87.907 kg), SpO2 97 %.  General: Well developed, well nourished, male in no acute distress Head: Eyes PERRLA, No xanthomas. Normocephalic and atraumatic, oropharynx without edema or exudate.  Lungs: Resp regular and unlabored, CTA. Heart: RRR no s3, s4, or murmurs..   Neck: No carotid bruits. No lymphadenopathy. No  JVD. Abdomen: Bowel sounds present, abdomen soft and non-tender without masses or hernias noted. Msk:  No spine or cva tenderness. No weakness, no joint deformities or effusions. Extremities: No clubbing, cyanosis or edema. DP 1+ and equal bilaterally. Neuro: Alert and oriented X 3. No focal deficits noted. Psych:  Good affect, responds appropriately Skin: No rashes or lesions noted.  Labs:   Lab Results  Component Value Date   WBC 8.5 03/21/2015   HGB 12.7* 03/21/2015   HCT 38.6* 03/21/2015   MCV 97.5 03/21/2015   PLT 218 03/21/2015     Recent Labs  03/21/15 0230  INR 1.63*    Recent Labs Lab 03/21/15 0532  NA 144  K 3.8  CL 104  CO2 29  BUN 11  CREATININE 1.55*  CALCIUM 9.2  GLUCOSE 108*   MAGNESIUM  Date Value Ref Range Status  08/01/2014 2.0 1.5 - 2.5 mg/dL Final    Recent Labs  03/21/15 0532 03/21/15 1050  TROPONINI <0.03 <0.03    Recent Labs  03/21/15 0232  TROPIPOC 0.02   PRO B NATRIURETIC PEPTIDE (BNP)  Date/Time Value Ref Range Status  08/01/2014 08:59 AM 128.0* 0.0 - 100.0 pg/mL Final  02/12/2014 10:28 AM 115.0* 0.0 - 100.0 pg/mL Final   Lab Results  Component Value Date   CHOL 74 03/21/2015   HDL 29* 03/21/2015   LDLCALC 35 03/21/2015   TRIG 50 03/21/2015   No results found for: DDIMER No results found for: LIPASE, AMYLASE TSH  Date/Time Value Ref Range Status  02/12/2014 10:28 AM 2.40 0.35 - 4.50 uIU/mL Final   FERRITIN  Date/Time Value Ref Range Status  02/08/2014 10:13 AM 60.4 22.0 - 322.0 ng/mL Final   IRON  Date/Time Value Ref Range Status  02/08/2014 10:13 AM 74 42 - 165 ug/dL Final    Echo: LV EF: 50% -  55%  ------------------------------------------------------------------- Indications:   427.31 Atrial Fibrillation.  ------------------------------------------------------------------- History:  PMH: Edema. Acquired from the patient and from the patient&'s chart. Atrial fibrillation. Coronary artery disease. Stroke. Chronic obstructive pulmonary disease. PMH:  Myocardial infarction. Risk factors: Hypertension. Dyslipidemia.  ------------------------------------------------------------------- Study Conclusions  - Left ventricle: The cavity size was normal. There was mild focal basal hypertrophy of the septum. Systolic function was normal. The estimated ejection fraction was in the range of 50% to 55%. There is akinesis of the basalinferior myocardium. - Aortic valve: There was mild regurgitation. - Mitral valve:  Calcified  annulus. There was mild regurgitation.   ECG:  Vent. rate 76 BPM PR interval * ms QRS duration 101 ms QT/QTc 427/480 ms P-R-T axes -1 74 54  Radiology:  Dg Chest Portable 1 View  03/21/2015  CLINICAL DATA:  Chest pain tonight. EXAM: PORTABLE CHEST 1 VIEW COMPARISON:  12/06/2010 FINDINGS: Shallow inspiration with elevation of the left hemidiaphragm. Atelectasis or infiltration demonstrated in the left lung base. Heart size and pulmonary vascularity are normal. No blunting of costophrenic angles. No pneumothorax. Calcification of the aorta. IMPRESSION: Shallow inspiration with elevation of left hemidiaphragm. Atelectasis or infiltration in the left lung base. Electronically Signed   By: Lucienne Capers M.D.   On: 03/21/2015 02:19    ASSESSMENT AND PLAN:     1. Chest pain - As above typical and atypical features. Responsive to sublingual nitroglycerin. This pain is different from prior cardiac pain. EKG nonischemic. Troponin negative. He wants to go home. - Will schedule outpatient stress test. OK discharged from cardiac standpoint.  2. Coronary artery disease - He is not on aspirin as he is on Eliquis. Continue nitrates, ACE inhibitor, statin.   3 Chronic atrial fibrillation:  - CHADSVASCs score 6. Continue  Eliquis. He is on the appropriate dose. He is not having any bleeding complications.   4. Carotid stenosis, bilateral:  - Follow-up with vascular surgery as schedule  5 Essential hypertension:  - Controlled. Continue nitrates, ACE inhibitor.   6 HLD (hyperlipidemia):  - 03/21/2015: Cholesterol 74; HDL 29*; LDL Cholesterol 35; Triglycerides 50; VLDL 10  - Continue stain  7. Tobacco abuse - Advised cessation  Signed: Bhagat,Bhavinkumar, PA 03/22/2015, 9:14 AM Pager [phone]  Co-Sign MD As above, patient seen and examined. Briefly he is a 75 year old male with past medical history of coronary artery disease, hypertension, hyperlipidemia, prior CVA, permanent atrial  fibrillation, tobacco abuse for evaluation of chest pain. The patient has had intermittent chest pain at home chronically. Yesterday he had 2 separate episodes. It is in the left breast area and described as a sharp pain. No radiation. Not exertional, pleuritic, positional or related to food. No associated symptoms. Resolved with 2 sublingual nitroglycerin after 5 minutes. He had a second similar episode and presented to the emergency room. Presently pain-free. Electrocardiogram shows atrial fibrillation with no ST changes. PVC. Enzymes negative. Symptoms are atypical and somewhat chronic. He can be discharged on preadmission medications and have outpatient nuclear study. Follow-up Dr. Burt Knack afterwards. DC tobacco use. Kirk Ruths

## 2015-03-22 NOTE — Evaluation (Signed)
Clinical/Bedside Swallow Evaluation Patient Details  Name: [name] MRN: [id number] Date of Birth: [birth year]  Today's Date: 03/22/2015 Time: SLP Start Time (ACUTE ONLY): 1400 SLP Stop Time (ACUTE ONLY): 1415 SLP Time Calculation (min) (ACUTE ONLY): 15 min  Past Medical History:  Past Medical History  Diagnosis Date  . 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 Hypertension   . Bradycardia   . Dyslipidemia   . Tobacco abuse   . CVA (cerebral vascular accident) ([place])   . History of benign prostatic hypertrophy   . GERD (gastroesophageal reflux disease)   . Anxiety   . History of leukocytosis   . Neuropathy ([place])   . COPD (chronic obstructive pulmonary disease) ([place])   . Peripheral vascular disease ([place])   . 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 MI, old   . Foot pain   . DVT (deep venous thrombosis) ([place])   . Carotid artery occlusion   . Atrial fibrillation ([place])   . Depression   . Colon polyps   . Common bile duct stone   . Cataract     bilataeral cateracts removed  . Neuromuscular disorder (HCC)     neuropathy  . CKD (chronic kidney disease), stage III   . Essential hypertension    Past Surgical History:  Past Surgical History  Procedure Laterality Date  . Intervention of the rca      using a single BMS  . Ankle surgery    . Bypass graft  2009 or  2010    stent  . Cholecystectomy      Gall Bladder  . Colonoscopy     HPI:  [name] is a 75 y.o. 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    Assessment / Plan / Recommendation Clinical Impression  Patient presents with a mild pharyngeal-esophageal dysphagia as per observation today with  thin liquids and patient's history. MBSS in 2016 revealed mild dysphagia characterized by penetration without aspiration and chin tuck aided in preventing/limiting penetration. Patient did not exhibit any overt s/s of aspiration at bedside with thin liquids. He declined solids as he had just finished lunch, although he denied any problems. SLP educated patient and demonstrated chin tuck posture, though likely he will not maintain this at home.    Aspiration Risk  Mild aspiration risk    Diet Recommendation Regular;Thin liquid   Liquid Administration via: Cup;Straw Medication Administration: Whole meds with liquid Supervision: Patient able to self feed Compensations: Chin tuck    Other  Recommendations Oral Care Recommendations: Oral care BID   Follow up Recommendations  None    Frequency and Duration     N./A       Prognosis        Swallow Study   General Date of Onset: 03/21/15 HPI: [name] is a 75 y.o. 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  Type of Study: Bedside Swallow Evaluation Previous Swallow Assessment: Patient stated swallow eval "3 months ago". SLP was only able to find MBS from January of last year (2016) Diet Prior to this Study: Regular;Thin liquids Temperature Spikes Noted: No Respiratory Status: Room air History of Recent Intubation: No Behavior/Cognition: Alert;Cooperative;Pleasant mood Oral Care Completed by SLP: No Oral Cavity - Dentition: Edentulous (Patient reports that he has not used his dentures for "3-4 years") Vision: Functional for self-feeding Self-Feeding Abilities: Able to feed self Baseline Vocal Quality: Normal Volitional Cough: Strong Volitional Swallow: Able to  elicit    Oral/Motor/Sensory Function Overall Oral Motor/Sensory Function: Generalized oral weakness   Ice Chips Ice chips: Not tested   Thin Liquid Thin Liquid: Within functional limits Presentation: Cup;Straw    Nectar Thick     Honey Thick     Puree Puree: Not tested   Solid   GO   Solid: Not tested    Functional Assessment Tool Used: Bedside swallow assessment Functional Limitations: Swallowing Swallow Current Status [id number] At least 1 percent but less than 20 percent impaired, limited or restricted Swallow Goal Status [phone]): At least 1 percent but less than 20 percent impaired, limited or restricted Swallow Discharge Status [phone]): At least 1 percent but less than 20 percent impaired, limited or restricted   [name] 03/22/2015,4:04 PM    [name], [place], CCC-SLP 03/22/2015 4:04 PM

## 2015-03-22 NOTE — Discharge Instructions (Signed)
Nonspecific Chest Pain  °Chest pain can be caused by many different conditions. There is always a chance that your pain could be related to something serious, such as a heart attack or a blood clot in your lungs. Chest pain can also be caused by conditions that are not life-threatening. If you have chest pain, it is very important to follow up with your health care provider. °CAUSES  °Chest pain can be caused by: °· Heartburn. °· Pneumonia or bronchitis. °· Anxiety or stress. °· Inflammation around your heart (pericarditis) or lung (pleuritis or pleurisy). °· A blood clot in your lung. °· A collapsed lung (pneumothorax). It can develop suddenly on its own (spontaneous pneumothorax) or from trauma to the chest. °· Shingles infection (varicella-zoster virus). °· Heart attack. °· Damage to the bones, muscles, and cartilage that make up your chest wall. This can include: °¨ Bruised bones due to injury. °¨ Strained muscles or cartilage due to frequent or repeated coughing or overwork. °¨ Fracture to one or more ribs. °¨ Sore cartilage due to inflammation (costochondritis). °RISK FACTORS  °Risk factors for chest pain may include: °· Activities that increase your risk for trauma or injury to your chest. °· Respiratory infections or conditions that cause frequent coughing. °· Medical conditions or overeating that can cause heartburn. °· Heart disease or family history of heart disease. °· Conditions or health behaviors that increase your risk of developing a blood clot. °· Having had chicken pox (varicella zoster). °SIGNS AND SYMPTOMS °Chest pain can feel like: °· Burning or tingling on the surface of your chest or deep in your chest. °· Crushing, pressure, aching, or squeezing pain. °· Dull or sharp pain that is worse when you move, cough, or take a deep breath. °· Pain that is also felt in your back, neck, shoulder, or arm, or pain that spreads to any of these areas. °Your chest pain may come and go, or it may stay  constant. °DIAGNOSIS °Lab tests or other studies may be needed to find the cause of your pain. Your health care provider may have you take a test called an ambulatory ECG (electrocardiogram). An ECG records your heartbeat patterns at the time the test is performed. You may also have other tests, such as: °· Transthoracic echocardiogram (TTE). During echocardiography, sound waves are used to create a picture of all of the heart structures and to look at how blood flows through your heart. °· Transesophageal echocardiogram (TEE). This is a more advanced imaging test that obtains images from inside your body. It allows your health care provider to see your heart in finer detail. °· Cardiac monitoring. This allows your health care provider to monitor your heart rate and rhythm in real time. °· Holter monitor. This is a portable device that records your heartbeat and can help to diagnose abnormal heartbeats. It allows your health care provider to track your heart activity for several days, if needed. °· Stress tests. These can be done through exercise or by taking medicine that makes your heart beat more quickly. °· Blood tests. °· Imaging tests. °TREATMENT  °Your treatment depends on what is causing your chest pain. Treatment may include: °· Medicines. These may include: °¨ Acid blockers for heartburn. °¨ Anti-inflammatory medicine. °¨ Pain medicine for inflammatory conditions. °¨ Antibiotic medicine, if an infection is present. °¨ Medicines to dissolve blood clots. °¨ Medicines to treat coronary artery disease. °· Supportive care for conditions that do not require medicines. This may include: °¨ Resting. °¨ Applying heat   or cold packs to injured areas. °¨ Limiting activities until pain decreases. °HOME CARE INSTRUCTIONS °· If you were prescribed an antibiotic medicine, finish it all even if you start to feel better. °· Avoid any activities that bring on chest pain. °· Do not use any tobacco products, including  cigarettes, chewing tobacco, or electronic cigarettes. If you need help quitting, ask your health care provider. °· Do not drink alcohol. °· Take medicines only as directed by your health care provider. °· Keep all follow-up visits as directed by your health care provider. This is important. This includes any further testing if your chest pain does not go away. °· If heartburn is the cause for your chest pain, you may be told to keep your head raised (elevated) while sleeping. This reduces the chance that acid will go from your stomach into your esophagus. °· Make lifestyle changes as directed by your health care provider. These may include: °¨ Getting regular exercise. Ask your health care provider to suggest some activities that are safe for you. °¨ Eating a heart-healthy diet. A registered dietitian can help you to learn healthy eating options. °¨ Maintaining a healthy weight. °¨ Managing diabetes, if necessary. °¨ Reducing stress. °SEEK MEDICAL CARE IF: °· Your chest pain does not go away after treatment. °· You have a rash with blisters on your chest. °· You have a fever. °SEEK IMMEDIATE MEDICAL CARE IF:  °· Your chest pain is worse. °· You have an increasing cough, or you cough up blood. °· You have severe abdominal pain. °· You have severe weakness. °· You faint. °· You have chills. °· You have sudden, unexplained chest discomfort. °· You have sudden, unexplained discomfort in your arms, back, neck, or jaw. °· You have shortness of breath at any time. °· You suddenly start to sweat, or your skin gets clammy. °· You feel nauseous or you vomit. °· You suddenly feel light-headed or dizzy. °· Your heart begins to beat quickly, or it feels like it is skipping beats. °These symptoms may represent a serious problem that is an emergency. Do not wait to see if the symptoms will go away. Get medical help right away. Call your local emergency services (911 in the [place] Do not drive yourself to the hospital. °  °This  information is not intended to replace advice given to you by your health care provider. Make sure you discuss any questions you have with your health care provider. °  °Document Released: 11/19/2004 Document Revised: 03/02/2014 Document Reviewed: 09/15/2013 °Elsevier Interactive Patient Education ©2016 [employer] ° °

## 2015-03-22 NOTE — Progress Notes (Signed)
OT Cancellation Note  Patient Details Name: [name] MRN: [id number] DOB: 1940/09/12   Cancelled Treatment:    Reason Eval/Treat Not Completed: Patient at procedure or test/ unavailable. Will follow.  [name] 03/22/2015, 11:34 AM  [phone]

## 2015-03-22 NOTE — Progress Notes (Signed)
  Echocardiogram 2D Echocardiogram has been performed.  Diamond Nickel 03/22/2015, 12:31 PM

## 2015-03-22 NOTE — Discharge Summary (Addendum)
Physician Discharge Summary  [name]:[id number] DOB: 1940-11-02 DOA: 03/21/2015  PCP: Martha Clan, MD  Admit date: 03/21/2015 Discharge date: 03/22/2015  Time spent: 45 minutes  Recommendations for Outpatient Follow-up:  Patient will be discharged to home with home health PT.  Patient will need to follow up with primary care provider within one week of discharge.  Follow up with Dr. Excell Seltzer for outpatient stress test.  Patient should continue medications as prescribed.  Patient should follow a heart healthy diet.    Discharge Diagnoses:  Principal Problem:   Chest pain Active Problems:   HLD (hyperlipidemia)   Anxiety state   TOBACCO ABUSE   Essential hypertension   Coronary atherosclerosis   GASTROESOPHAGEAL REFLUX DISEASE   BPH (benign prostatic hyperplasia)   Carotid stenosis   Chronic atrial fibrillation (HCC)   CKD (chronic kidney disease), stage III   Dysphagia  Discharge Condition: Stable  Diet recommendation: Heart healthy  Filed Weights   03/21/15 0157 03/21/15 1815 03/22/15 0501  Weight: 90.719 kg (200 lb) 88.179 kg (194 lb 6.4 oz) 87.907 kg (193 lb 12.8 oz)    History of present illness:  on 03/21/2015 by Dr. Alois Cliche is a 75 y.o. male with PMH of COPD, GERD, depression, CAD, s/p of BMS, tobacco abuse, stroke, dysphagia from previous stroke, PVD, chronic kidney disease-3, atrial fibrillation on Eliquis, DVT, who presents with chest pain.  Patient reports that he started having chest pain at about 7 PM last night. It is associated with shortness of breath, no cough, fever or chills. His chest pain is located in the left side of chest, intermittent, 5 out of 10 in severity, nonradiating. Patient does not have tenderness over calf areas. Patient does not have abdominal pain, diarrhea, symptoms of UTI, unilateral weakness. Patient has chronic dysphagia from previous stroke. Currently patient is chest pain-free.  In ED, patient was found  to have negative troponin, WBC 9.5, temperature normal, no tachycardia, stable renal function. Chest x-ray showed shallow inspiration with elevation of left hemidiaphragm, atelectasis or infiltration in the left lung base.  Hospital Course:  Chest pain -Patient does have significant risk factors including history of coronary artery disease, age, hyperlipidemia and hypertension -Troponins currently cycled and negative -Hemoglobin A1c 5.9 -lipid panel: TC 74, TG 50, HDL 29, LDL 35 -Cardiology consulted and appreciated, recommended outpatient stress test and follow up with Dr. Excell Seltzer, continue home medications: imdur, lisinopril, nitrostat PRN, statin -Echo: EF 45-50%  Coronary artery disease -Treatment plan as above  Hyperlipidemia -Continue statin  Hypertension -Continue lisinopril, Lasix held- resume at discharge  Depression and anxiety -Continue Zoloft, Risperdal, Valium  Tobacco abuse -Discussed smoking cessation, continue nicotine patch  GERD -Continue PPI  BPH -Continue Proscar  Atrial fibrillation -CHADSVASC 6 -Continue Eliquis -Currently rate controlled  Chronic kidney disease, stage III -Currently stable, baseline creatinine 1.5-1.7 -Continue to monitor BMP  Dysphagia/history of stroke/physical deconditioning -Speech therapy consulted -PT consulted recommended home health PT  Code Status: DO NOT INTUBATE  Procedures: Echocardiogram  Consultations: Cardiology  Discharge Exam: Filed Vitals:   03/22/15 0501 03/22/15 1012  BP: 130/60 138/64  Pulse: 110   Temp: 98.3 F (36.8 C)   Resp: 17      General: Well developed, well nourished, NAD  HEENT: NCAT,mucous membranes moist.  Cardiovascular: S1 S2 auscultated, irregular  Respiratory: Clear to auscultation  Abdomen: Soft, nontender, nondistended, + bowel sounds  Extremities: warm dry without cyanosis clubbing or edema  Neuro: AAOx3, nonfocal, slight right  facial droop (old)  Psych: Normal  affect and demeanor  Discharge Instructions      Discharge Instructions    Discharge instructions    Complete by:  As directed   Patient will be discharged to home with home health PT.  Patient will need to follow up with primary care provider within one week of discharge.  Follow up with Dr. Excell Seltzer for outpatient stress test.  Patient should continue medications as prescribed.  Patient should follow a heart healthy diet.            Medication List    TAKE these medications        apixaban 5 MG Tabs tablet  Commonly known as:  ELIQUIS  TAKE 1 TABLET (5 MG TOTAL) BY MOUTH 2 (TWO) TIMES DAILY.     diazepam 5 MG tablet  Commonly known as:  VALIUM  Take 7.5 mg by mouth daily.     finasteride 5 MG tablet  Commonly known as:  PROSCAR  Take 5 mg by mouth daily.     furosemide 20 MG tablet  Commonly known as:  LASIX  Take 20 mg by mouth daily.     HYDROcodone-acetaminophen 7.5-325 MG tablet  Commonly known as:  NORCO  Take 1 tablet by mouth every 6 (six) hours as needed. for pain     isosorbide mononitrate 30 MG 24 hr tablet  Commonly known as:  IMDUR  TAKE 1 TABLET BY MOUTH EVERY DAY     lisinopril 10 MG tablet  Commonly known as:  PRINIVIL,ZESTRIL  Take 20 mg by mouth daily.     lubiprostone 8 MCG capsule  Commonly known as:  AMITIZA  Take 1 capsule (8 mcg total) by mouth 2 (two) times daily with a meal.     nicotine 14 mg/24hr patch  Commonly known as:  NICODERM CQ - dosed in mg/24 hours  Place 1 patch (14 mg total) onto the skin daily.     NITROSTAT 0.4 MG SL tablet  Generic drug:  nitroGLYCERIN  PLACE ONE TABLET UNDER TONGUE AS NEEDED     omeprazole 40 MG capsule  Commonly known as:  PRILOSEC  Take 1 capsule (40 mg total) by mouth daily.     potassium chloride SA 20 MEQ tablet  Commonly known as:  KLOR-CON M20  TAKE 1 TABLET (20 MEQ TOTAL) BY MOUTH DAILY.     risperiDONE 0.5 MG tablet  Commonly known as:  RISPERDAL  Take 0.5 mg by mouth daily.      sertraline 100 MG tablet  Commonly known as:  ZOLOFT  Take 100 mg by mouth daily.     simvastatin 40 MG tablet  Commonly known as:  ZOCOR  TAKE 1 TABLET BY MOUTH AT BEDTIME       Allergies  Allergen Reactions  . Hctz [Hydrochlorothiazide] Anaphylaxis and Other (See Comments)    Throat Swelling  . Amitriptyline Other (See Comments)    Made me go crazy  . Gabapentin Other (See Comments)    Made me go crazy  . Metoprolol Tartrate Other (See Comments)    fatigue  . Paroxetine Other (See Comments)    Made me go crazy  . Spiriva Handihaler [Tiotropium Bromide Monohydrate] Other (See Comments)    Urinary retention   Follow-up Information    Follow up with Norma Fredrickson, NP. Go on 04/01/2015.   Specialties:  Nurse Practitioner, Interventional Cardiology, Cardiology, Radiology   Why:  @2 :30 for post hospital    Contact information:  [address] [place] [address] [phone]       Follow up with [facility]. Go on 03/28/2015.   Specialty:  Cardiology   Why:  for lexiscan @ 12:30   Contact information:   [address] [place] [address] [phone]      Follow up with Martha Clan, MD. Schedule an appointment as soon as possible for a visit in 1 week.   Specialty:  Internal Medicine   Why:  Hospital follow up   Contact information:   [address] [place] [address] [phone]        The results of significant diagnostics from this hospitalization (including imaging, microbiology, ancillary and laboratory) are listed below for reference.    Significant Diagnostic Studies: Dg Chest Portable 1 View  03/21/2015  CLINICAL DATA:  Chest pain tonight. EXAM: PORTABLE CHEST 1 VIEW COMPARISON:  12/06/2010 FINDINGS: Shallow inspiration with elevation of the left hemidiaphragm. Atelectasis or infiltration demonstrated in the left lung base. Heart size and pulmonary vascularity are normal. No blunting of  costophrenic angles. No pneumothorax. Calcification of the aorta. IMPRESSION: Shallow inspiration with elevation of left hemidiaphragm. Atelectasis or infiltration in the left lung base. Electronically Signed   By: Burman Nieves M.D.   On: 03/21/2015 02:19    Microbiology: No results found for this or any previous visit (from the past 240 hour(s)).   Labs: Basic Metabolic Panel:  Recent Labs Lab 03/21/15 0230 03/21/15 0532  NA 143 144  K 3.9 3.8  CL 106 104  CO2 25 29  GLUCOSE 99 108*  BUN 11 11  CREATININE 1.58* 1.55*  CALCIUM 8.9 9.2   Liver Function Tests: No results for input(s): AST, ALT, ALKPHOS, BILITOT, PROT, ALBUMIN in the last 168 hours. No results for input(s): LIPASE, AMYLASE in the last 168 hours. No results for input(s): AMMONIA in the last 168 hours. CBC:  Recent Labs Lab 03/21/15 0230 03/21/15 0532  WBC 9.5 8.5  NEUTROABS 6.5  --   HGB 12.8* 12.7*  HCT 38.1* 38.6*  MCV 97.4 97.5  PLT 207 218   Cardiac Enzymes:  Recent Labs Lab 03/21/15 0532 03/21/15 1050  TROPONINI <0.03 <0.03   BNP: BNP (last 3 results)  Recent Labs  03/21/15 0230  BNP 70.2    ProBNP (last 3 results)  Recent Labs  08/01/14 0859  PROBNP 128.0*    CBG:  Recent Labs Lab 03/21/15 0848 03/22/15 0805  GLUCAP 81 99       Signed:  Alichia Alridge  [facility] 03/22/2015, 11:54 AM

## 2015-03-22 NOTE — Care Management Note (Signed)
Case Management Note  Patient Details  Name: [name] MRN: [id number] Date of Birth: [birth year]  Subjective/Objective:   Pt admitted for chest pain. Pt is from home with daughter, however daughter works and friends/ family come into visit.             Action/Plan: CM did offer choice and daughter chose [facility]. Referral made and SOC to begin within 24-48 hrs of d/c. DME RW to be delivered to room before d/c. No further needs from CM at this time.    Expected Discharge Date:                  Expected Discharge Plan:  [place]  In-House Referral:  NA  Discharge planning Services  CM Consult  Post Acute Care Choice:  Home Health Choice offered to:  Patient, Adult Children  DME Arranged:  Jamie rolling DME Agency:  [place]:  PT [place]:  [place]  Status of Service:  Completed, signed off  Medicare Important Message Given:    Date Medicare IM Given:    Medicare IM give by:    Date Additional Medicare IM Given:    Additional Medicare Important Message give by:     If discussed at [place] of Stay Meetings, dates discussed:    Additional Comments:  Bethena Roys, RN 03/22/2015, 2:41 PM

## 2015-03-22 NOTE — Evaluation (Addendum)
Physical Therapy Evaluation Patient Details Name: [name] MRN: [id number] DOB: 1940/06/26 Today's Date: 03/22/2015   History of Present Illness  [name] is a 75 y.o. 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   Clinical Impression  Pt admitted with above diagnosis. Pt currently with functional limitations due to the deficits listed below (see PT Problem List).  Pt will benefit from skilled PT to increase their independence and safety with mobility to allow discharge to the venue listed below.  Pt unsteady with shuffeling gait pattern, but no LOB with gait without AD. Short trial with RW, and he was steadier, and he verbalized feeling much steadier.  Recommend RW and HHPT as pt lives alone.     Follow Up Recommendations Home health PT    Equipment Recommendations  Rolling [place]ie with 5" wheels    Recommendations for Other Services       Precautions / Restrictions Precautions Precautions: Fall Restrictions Weight Bearing Restrictions: No      Mobility  Bed Mobility Overal bed mobility: Modified Independent                Transfers Overall transfer level: Needs assistance Equipment used: None Transfers: Sit to/from Stand Sit to Stand: Min guard         General transfer comment: min/guard for steadying.  Uses a wide BOS and relies on input from bed on back of legs  Ambulation/Gait Ambulation/Gait assistance: Min guard Ambulation Distance (Feet): 110 Feet Assistive device: None Gait Pattern/deviations: Shuffle;Decreased step length - left;Decreased step length - right     General Gait Details: Ambulated in hallway without AD with min/guard, but unsteady with shuffeling gait pattern.  Returned to room to do trial with RW.  Transport arrived with w/c in hallway, so pt ambulated from bed to hallway with RW and was much steadier and he reported feeling steadier.  Stairs            Wheelchair Mobility    Modified Rankin (Stroke Patients Only)       Balance Overall balance assessment: Needs assistance;History of Falls         Standing balance support: No upper extremity supported Standing balance-Leahy Scale: Fair                               Pertinent Vitals/Pain Pain Assessment: 0-10 Pain Score: 6  Pain Location: feet- neuropathy Pain Intervention(s): Premedicated before session;Other (comment) (wore slippers instead of the grip socks)    Home Living Family/patient expects to be discharged to:: Private residence Living Arrangements: Alone Available Help at Discharge: Family;Friend(s);Available 24 hours/day Type of Home: House Home Access: Stairs to enter Entrance Stairs-Rails: Left Entrance Stairs-Number of Steps: 3 Home Layout: One level Home Equipment: Cane - single point      Prior Function Level of Independence: Independent         Comments: States he stays inside all the time and watches TV and only leaves for MD appts     Hand Dominance        Extremity/Trunk Assessment   Upper Extremity Assessment: Defer to OT evaluation  Lower Extremity Assessment: Overall WFL for tasks assessed      Cervical / Trunk Assessment: Normal  Communication   Communication: No difficulties  Cognition Arousal/Alertness: Awake/alert Behavior During Therapy: Flat affect;WFL for tasks assessed/performed Overall Cognitive Status: Within Functional Limits for tasks assessed                      General Comments      Exercises        Assessment/Plan    PT Assessment Patient needs continued PT services  PT Diagnosis Difficulty walking   PT Problem List Decreased strength;Decreased balance;Decreased  mobility  PT Treatment Interventions Gait training;Functional mobility training;Therapeutic activities;Therapeutic exercise;DME instruction   PT Goals (Current goals can be found in the Care Plan section) Acute Rehab PT Goals Patient Stated Goal: go home PT Goal Formulation: With patient Time For Goal Achievement: 04/05/15 Potential to Achieve Goals: Good    Frequency Min 3X/week   Barriers to discharge Decreased caregiver support lives alone    Co-evaluation               End of Session Equipment Utilized During Treatment: Gait belt Activity Tolerance: Patient tolerated treatment well Patient left: Other (comment) (taken by transport to [place]) Nurse Communication: Mobility status         Time: 1049-1101 PT Time Calculation (min) (ACUTE ONLY): 12 min   Charges:   PT Evaluation $PT Eval Moderate Complexity: 1 Procedure     PT G Codes:        Deshannon Hinchliffe LUBECK 03/22/2015, 11:13 AM

## 2015-03-22 NOTE — Evaluation (Signed)
Occupational Therapy Evaluation Patient Details Name: [name] MRN: [id number] DOB: January 05, 1941 Today's Date: 03/22/2015    History of Present Illness [name] is a 76 y.o. 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    Clinical Impression    Pt likely functioning at his baseline in self care and mobility.  Pt not receptive to further OT, but politely listened as he was educated in home safety and fall prevention.  Pt is very eager to discharge. He is agreeable to a RW for home use.  Follow Up Recommendations  No OT follow up;Supervision - Intermittent, Agree with HHPT for strengthening and safety assessment   Equipment Recommendations  Other (comment) (RW)    Recommendations for Other Services       Precautions / Restrictions Precautions Precautions: Fall Restrictions Weight Bearing Restrictions: No      Mobility Bed Mobility Overal bed mobility: Modified Independent                Transfers Overall transfer level: Needs assistance Equipment used: Rolling [place] (2 wheeled) Transfers: Sit to/from Stand Sit to Stand: Supervision         General transfer comment: verbal cues for hand placement    Balance Overall balance assessment: Needs assistance;History of Falls         Standing balance support: No upper extremity supported Standing balance-Leahy Scale: Fair                              ADL Overall ADL's : At baseline                                       General ADL Comments: Pt with tendency to downplay any difficulties he is having at home.  Recommended pt consider a grab bar for tub transfer and educated in use of 3 in 1 as elevated toilet  seat. Pt only agreeable to RW, instructed to swap front wheels on [place] to narrow the width for easier navigation through home.     Vision     Perception     Praxis      Pertinent Vitals/Pain Pain Assessment: Faces Pain Score: 6  Faces Pain Scale: Hurts even more Pain Location: feet Pain Descriptors / Indicators: Sore Pain Intervention(s): Limited activity within patient's tolerance;Monitored during session;Repositioned     Hand Dominance Right   Extremity/Trunk Assessment Upper Extremity Assessment Upper Extremity Assessment: Generalized weakness   Lower Extremity Assessment Lower Extremity Assessment: Defer to PT evaluation   Cervical / Trunk Assessment Cervical / Trunk Assessment: Normal   Communication Communication Communication: No difficulties   Cognition Arousal/Alertness: Awake/alert Behavior During Therapy: Flat affect;WFL for tasks assessed/performed Overall Cognitive Status: Within Functional Limits for tasks assessed                     General Comments       Exercises       Shoulder Instructions      Home Living Family/patient expects to be discharged to:: Private residence Living Arrangements: Alone Available Help at Discharge: Family;Friend(s);Available PRN/intermittently  Type of Home: House Home Access: Stairs to enter [employer] of Steps: 3 Entrance Stairs-Rails: Left Home Layout: One level     Bathroom Shower/Tub: Teacher, early years/pre: Standard     Home Equipment: Cane - single point;Shower seat;Hand held shower head          Prior Functioning/Environment Level of Independence: Needs assistance  Gait / Transfers Assistance Needed: walks holding furniture ADL's / Homemaking Assistance Needed: pays neighbor to clean his house, friend gets groceries, pt sits to shower   Comments: States he stays inside all the time and watches TV and only leaves for MD appts    OT Diagnosis: Generalized  weakness;Acute pain   OT Problem List:     OT Treatment/Interventions:      OT Goals(Current goals can be found in the care plan section) Acute Rehab OT Goals Patient Stated Goal: go home  OT Frequency:     Barriers to D/C:            Co-evaluation              End of Session Equipment Utilized During Treatment: Surveyor, mining Communication:  (pt eager to go home)  Activity Tolerance: Patient tolerated treatment well Patient left: in bed;with call bell/phone within reach   Time: 1325-1348 OT Time Calculation (min): 23 min Charges:  OT General Charges $OT Visit: 1 Procedure OT Evaluation $OT Eval Low Complexity: 1 Procedure G-Codes: OT G-codes **NOT FOR INPATIENT CLASS** Functional Assessment Tool Used: clinical judgement Functional Limitation: Self care Self Care Current Status [id number]): At least 1 percent but less than 20 percent impaired, limited or restricted Self Care Goal Status [id number]): At least 1 percent but less than 20 percent impaired, limited or restricted Self Care Discharge Status [phone]): At least 1 percent but less than 20 percent impaired, limited or restricted  [name] 03/22/2015, 1:52 PM  [phone]

## 2015-03-22 NOTE — Care Management Obs Status (Signed)
[place] NOTIFICATION   Patient Details  Name: QUARTEZ BRADEEN MRN: [id number] Date of Birth: February 26, 1940   Medicare Observation Status Notification Given:  Yes    Bethena Roys, RN 03/22/2015, 12:07 PM

## 2015-03-25 DIAGNOSIS — I69391 Dysphagia following cerebral infarction: Secondary | ICD-10-CM | POA: Diagnosis not present

## 2015-03-25 DIAGNOSIS — R1310 Dysphagia, unspecified: Secondary | ICD-10-CM | POA: Diagnosis not present

## 2015-03-25 DIAGNOSIS — Z720 Tobacco use: Secondary | ICD-10-CM | POA: Diagnosis not present

## 2015-03-25 DIAGNOSIS — I69392 Facial weakness following cerebral infarction: Secondary | ICD-10-CM | POA: Diagnosis not present

## 2015-03-25 DIAGNOSIS — I2510 Atherosclerotic heart disease of native coronary artery without angina pectoris: Secondary | ICD-10-CM | POA: Diagnosis not present

## 2015-03-25 DIAGNOSIS — N183 Chronic kidney disease, stage 3 (moderate): Secondary | ICD-10-CM | POA: Diagnosis not present

## 2015-03-25 DIAGNOSIS — R001 Bradycardia, unspecified: Secondary | ICD-10-CM | POA: Diagnosis not present

## 2015-03-25 DIAGNOSIS — I739 Peripheral vascular disease, unspecified: Secondary | ICD-10-CM | POA: Diagnosis not present

## 2015-03-25 DIAGNOSIS — I4891 Unspecified atrial fibrillation: Secondary | ICD-10-CM | POA: Diagnosis not present

## 2015-03-25 DIAGNOSIS — J449 Chronic obstructive pulmonary disease, unspecified: Secondary | ICD-10-CM | POA: Diagnosis not present

## 2015-03-25 DIAGNOSIS — I129 Hypertensive chronic kidney disease with stage 1 through stage 4 chronic kidney disease, or unspecified chronic kidney disease: Secondary | ICD-10-CM | POA: Diagnosis not present

## 2015-03-25 DIAGNOSIS — I252 Old myocardial infarction: Secondary | ICD-10-CM | POA: Diagnosis not present

## 2015-03-25 DIAGNOSIS — F329 Major depressive disorder, single episode, unspecified: Secondary | ICD-10-CM | POA: Diagnosis not present

## 2015-03-25 DIAGNOSIS — E785 Hyperlipidemia, unspecified: Secondary | ICD-10-CM | POA: Diagnosis not present

## 2015-03-25 NOTE — Progress Notes (Signed)
PT NOTE: Late entry for G codes  10-Apr-2015 1113  PT G-Codes **NOT FOR INPATIENT CLASS**  Functional Assessment Tool Used clinical judgement and objective findings  Functional Limitation Mobility: Walking and moving around  Mobility: Walking and Moving Around Current Status [id number]) CI  Mobility: Walking and Moving Around Goal Status [phone]) CI

## 2015-03-26 ENCOUNTER — Encounter: Payer: Self-pay | Admitting: Cardiovascular Disease

## 2015-03-26 ENCOUNTER — Telehealth (HOSPITAL_COMMUNITY): Payer: Self-pay | Admitting: Radiology

## 2015-03-26 NOTE — Telephone Encounter (Signed)
Patient given detailed instructions per Myocardial Perfusion Study Information Sheet for the test on 03/28/2015 at 12:30. Patient notified to arrive 15 minutes early and that it is imperative to arrive on time for appointment to keep from having the test rescheduled.  If you need to cancel or reschedule your appointment, please call the office within 24 hours of your appointment. Failure to do so may result in a cancellation of your appointment, and a $50 no show fee. Patient verbalized understanding.EHK

## 2015-03-28 ENCOUNTER — Ambulatory Visit (HOSPITAL_COMMUNITY): Payer: Medicare Other | Attending: Cardiovascular Disease

## 2015-03-28 DIAGNOSIS — I10 Essential (primary) hypertension: Secondary | ICD-10-CM | POA: Diagnosis not present

## 2015-03-28 DIAGNOSIS — R9439 Abnormal result of other cardiovascular function study: Secondary | ICD-10-CM | POA: Diagnosis not present

## 2015-03-28 DIAGNOSIS — I779 Disorder of arteries and arterioles, unspecified: Secondary | ICD-10-CM | POA: Diagnosis not present

## 2015-03-28 DIAGNOSIS — R0789 Other chest pain: Secondary | ICD-10-CM | POA: Diagnosis present

## 2015-03-28 LAB — MYOCARDIAL PERFUSION IMAGING
LV dias vol: 85 mL
LV sys vol: 43 mL
Peak HR: 93 {beats}/min
RATE: 0.25
Rest HR: 71 {beats}/min
SDS: 4
SRS: 19
SSS: 21
TID: 0.95

## 2015-03-28 MED ORDER — TECHNETIUM TC 99M SESTAMIBI GENERIC - CARDIOLITE
32.7000 | Freq: Once | INTRAVENOUS | Status: AC | PRN
  Administered 2015-03-28: 32.7 via INTRAVENOUS

## 2015-03-28 MED ORDER — REGADENOSON 0.4 MG/5ML IV SOLN
0.4000 mg | Freq: Once | INTRAVENOUS | Status: AC
  Administered 2015-03-28: 0.4 mg via INTRAVENOUS

## 2015-03-28 MED ORDER — TECHNETIUM TC 99M SESTAMIBI GENERIC - CARDIOLITE
10.4000 | Freq: Once | INTRAVENOUS | Status: AC | PRN
  Administered 2015-03-28: 10 via INTRAVENOUS

## 2015-04-01 ENCOUNTER — Ambulatory Visit (INDEPENDENT_AMBULATORY_CARE_PROVIDER_SITE_OTHER): Payer: Medicare Other | Admitting: Nurse Practitioner

## 2015-04-01 ENCOUNTER — Encounter: Payer: Self-pay | Admitting: Nurse Practitioner

## 2015-04-01 VITALS — BP 132/70 | HR 68 | Ht 70.0 in | Wt 198.0 lb

## 2015-04-01 DIAGNOSIS — I482 Chronic atrial fibrillation, unspecified: Secondary | ICD-10-CM

## 2015-04-01 DIAGNOSIS — I2510 Atherosclerotic heart disease of native coronary artery without angina pectoris: Secondary | ICD-10-CM

## 2015-04-01 DIAGNOSIS — R0600 Dyspnea, unspecified: Secondary | ICD-10-CM

## 2015-04-01 DIAGNOSIS — Z79899 Other long term (current) drug therapy: Secondary | ICD-10-CM

## 2015-04-01 MED ORDER — LISINOPRIL 5 MG PO TABS: 5.0000 mg | ORAL_TABLET | Freq: Every day | ORAL | Status: DC

## 2015-04-01 NOTE — Patient Instructions (Addendum)
We will be checking the following labs today - NONE   Medication Instructions:    Continue with your current medicines. BUT   We are restarting Lisinopril but at 5 mg each day - this is at the drug store  Will try to give you samples of Eliquis    Testing/Procedures To Be Arranged:  N/A  Follow-Up:   See Dr. Burt Knack as planned.      Other Special Instructions:   N/A    If you need a refill on your cardiac medications before your next appointment, please call your pharmacy.   Call the [place] office at [phone] if you have any questions, problems or concerns.

## 2015-04-01 NOTE — Progress Notes (Signed)
CARDIOLOGY OFFICE NOTE  Date:  04/01/2015    [name] Date of Birth: [birth year] Medical Record [id number]  PCP:  James Redwood, MD  Cardiologist:  [name]    Chief Complaint  Patient presents with  . Coronary Artery Disease  . Chest Pain    Post hospital follow up - seen for Dr. Burt [name]    History of Present Illness: [name] is a 75 y.o. male who presents today for a post hospital visit. Seen for Dr. Burt [name].   He has a PMH of COPD, GERD, depression, CAD with past inferior MI in 2010 treated with BMS. Cath in 2011 showed 50% stenosis at proximal edge of his RCA and otherwise no obstructive disease. EF 50 to 55%. 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 He has known carotid disease and PVD - Followed by [name].   Seen by James Dopp, PA back in June of 2016. I saw him in December of 2015. Last seen by Dr. Burt [name] in May of 2015.   He was recently admitted to [facility] with chest pain. Negative evaluation. Referred for outpatient stress test - this was read as low risk.   Comes back today. Here with a friend/caregiver. Says he has had no more chest pain. Breathing ok. Still using a cup to spit drool from his prior stroke. Lives alone. Does his own medicines. Sounds like he rarely takes his Lisinopril - says he does not have high blood pressure and therefore, does not take. He has some dizziness. No falls Asking about "the fluid around his heart". No swelling. He is back smoking a pack of cigs per day.   Past Medical History  Diagnosis Date  . 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 Hypertension   . Bradycardia   . Dyslipidemia   . Tobacco abuse   . CVA (cerebral vascular accident) ([place])   . History of benign prostatic hypertrophy   .  GERD (gastroesophageal reflux disease)   . Anxiety   . History of leukocytosis   . Neuropathy ([place])   . COPD (chronic obstructive pulmonary disease) ([place])   . Peripheral vascular disease ([place])   . 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 MI, old   . Foot pain   . DVT (deep venous thrombosis) ([place])   . Carotid artery occlusion   . Atrial fibrillation ([place])   . Depression   . Colon polyps   . Common bile duct stone   . Cataract     bilataeral cateracts removed  . Neuromuscular disorder (HCC)     neuropathy  . CKD (chronic kidney disease), stage III   . Essential hypertension     Past Surgical History  Procedure Laterality Date  . Intervention of the rca      using a single BMS  . Ankle surgery    . Bypass graft  2009 or  2010    stent  . Cholecystectomy      Gall Bladder  . Colonoscopy       Medications: Current Outpatient Prescriptions  Medication Sig Dispense Refill  . apixaban (ELIQUIS) 5 MG TABS tablet TAKE 1 TABLET (5 MG TOTAL) BY MOUTH 2 (TWO) TIMES DAILY. 60 tablet 6  . diazepam (VALIUM)  5 MG tablet Take 7.5 mg by mouth daily.      . finasteride (PROSCAR) 5 MG tablet Take 5 mg by mouth daily.     . furosemide (LASIX) 20 MG tablet Take 20 mg by mouth daily.     Marland Kitchen HYDROcodone-acetaminophen (NORCO) 7.5-325 MG tablet Take 1 tablet by mouth every 6 (six) hours as needed. for pain  0  . isosorbide mononitrate (IMDUR) 30 MG 24 hr tablet TAKE 1 TABLET BY MOUTH EVERY DAY 30 tablet 3  . lubiprostone (AMITIZA) 8 MCG capsule Take 1 capsule (8 mcg total) by mouth 2 (two) times daily with a meal. 60 capsule 1  . NITROSTAT 0.4 MG SL tablet PLACE ONE TABLET UNDER TONGUE AS NEEDED 25 tablet 1  . omeprazole (PRILOSEC) 40 MG capsule Take 1 capsule (40 mg total) by mouth daily. 90 capsule 3  . potassium chloride SA (KLOR-CON M20) 20 MEQ tablet TAKE 1 TABLET (20 MEQ TOTAL) BY MOUTH DAILY. 90 tablet 0  . risperiDONE (RISPERDAL) 0.5 MG tablet Take 0.5 mg by mouth daily.      .  sertraline (ZOLOFT) 100 MG tablet Take 100 mg by mouth daily.    . simvastatin (ZOCOR) 40 MG tablet TAKE 1 TABLET BY MOUTH AT BEDTIME 30 tablet 1  . lisinopril (PRINIVIL,ZESTRIL) 5 MG tablet Take 1 tablet (5 mg total) by mouth daily. 30 tablet 11   No current facility-administered medications for this visit.    Allergies: Allergies  Allergen Reactions  . Hctz [Hydrochlorothiazide] Anaphylaxis and Other (See Comments)    Throat Swelling  . Amitriptyline Other (See Comments)    Made me go crazy  . Gabapentin Other (See Comments)    Made me go crazy  . Metoprolol Tartrate Other (See Comments)    fatigue  . Paroxetine Other (See Comments)    Made me go crazy  . Spiriva Handihaler [Tiotropium Bromide Monohydrate] Other (See Comments)    Urinary retention    Social History: The patient  reports that he has been smoking Cigarettes and E-cigarettes.  He started smoking about 2 years ago. He has been smoking about 0.50 packs per day. He quit smokeless tobacco use about 2 years ago. He reports that he does not drink alcohol or use illicit drugs.   Family History: The patient's family history includes Cancer in his brother; Heart attack in his brother, father, and mother; Heart disease in his brother and brother; Hypertension in his brother, father, and mother; Stroke in his mother. There is no history of Colon cancer, Esophageal cancer, Rectal cancer, or Stomach cancer.   Review of Systems: Please see the history of present illness.   Otherwise, the review of systems is positive for none.   All other systems are reviewed and negative.   Physical Exam: VS:  BP 132/70 mmHg  Pulse 68  Ht 5\' 10"  (1.778 m)  Wt 198 lb (89.812 kg)  BMI 28.41 kg/m2 .  BMI Body mass index is 28.41 kg/(m^2).  Wt Readings from Last 3 Encounters:  04/01/15 198 lb (89.812 kg)  03/28/15 193 lb (87.544 kg)  03/22/15 193 lb 12.8 oz (87.907 kg)    General: Pleasant. Elderly male - kyphotic - slow to move. Looks  chronically ill. He is alert and in no acute distress.  HEENT: Normal. Neck: Supple, no JVD, carotid bruits, or masses noted.  Cardiac: Irregular irregular rhythm. Rate is ok.  No edema.  Respiratory:  Lungs are coarse. He smells of tobacco.  GI: Soft  and nontender.  MS: No deformity or atrophy. Gait and ROM intact but he is unsteady.  Skin: Warm and dry. Color is sallow.  Neuro:  Strength and sensation are intact and no gross focal deficits noted.  Psych: Alert, appropriate and with normal affect.   LABORATORY DATA:  EKG:  EKG is not ordered today.  Lab Results  Component Value Date   WBC 8.5 03/21/2015   HGB 12.7* 03/21/2015   HCT 38.6* 03/21/2015   PLT 218 03/21/2015   GLUCOSE 108* 03/21/2015   CHOL 74 03/21/2015   TRIG 50 03/21/2015   HDL 29* 03/21/2015   LDLCALC 35 03/21/2015   ALT 23 02/12/2014   AST 29 02/12/2014   NA 144 03/21/2015   K 3.8 03/21/2015   CL 104 03/21/2015   CREATININE 1.55* 03/21/2015   BUN 11 03/21/2015   CO2 29 03/21/2015   TSH 2.40 02/12/2014   INR 1.63* 03/21/2015   HGBA1C 5.9* 03/21/2015    BNP (last 3 results)  Recent Labs  03/21/15 0230  BNP 70.2    ProBNP (last 3 results)  Recent Labs  08/01/14 0859  PROBNP 128.0*     Other Studies Reviewed Today: Myoview Study Highlights from 03/28/2015     Nuclear stress EF: 50%.  There was no ST segment deviation noted during stress.  The left ventricular ejection fraction is mildly decreased (45-54%).  This is a low risk study.  Low risk stress nuclear study with small, severe, fixed apical defect and large, severe, partially reversible inferolateral defect; findings consistent with apical thinning and prior inferolateral infarct and mild peri-infarct ischemia; EF 50 with akinesis of the basal inferolateral wall.    Echo Study Conclusions from 02/2015  - Left ventricle: The cavity size was normal. Wall thickness was normal. Systolic function was mildly reduced. The  estimated ejection fraction was in the range of 45% to 50%. Hypokinesis of the basal-midinferolateral, inferior, and inferoseptal myocardium. - Aortic valve: There was trivial regurgitation.  LHC 2/11 LAD: Mid 40, dist 30 LCx: Dist 50 RCA: prox 50 at edge of stent R iliac occluded; L iliac 80 EF 50-55%  Assessment/Plan: 1. Chest pain - low risk Myoview - doing ok clinically. Would favor continued medical management. He has had no recurrence of chest pain.   2. AF - this is chronic - managed with rate control and anticoagulation.   3. Chronic anticoagulation - samples of Eliquis given today.   4. CKD  5. Tobacco abuse - not ready to stop. I do not see this changing.   Current medicines are reviewed with the patient today.  The patient does not have concerns regarding medicines other than what has been noted above.  The following changes have been made:  See above.  Labs/ tests ordered today include:   No orders of the defined types were placed in this encounter.     Disposition:   FU with Dr. Burt [name] as planned next month.   Patient is agreeable to this plan and will call if any problems develop in the interim.   Signed: Burtis Junes, RN, ANP-C 04/01/2015 11:46 AM  [facility] [address] [place], [place]  [address] Phone: [phone] Fax: [phone]

## 2015-04-02 DIAGNOSIS — I2510 Atherosclerotic heart disease of native coronary artery without angina pectoris: Secondary | ICD-10-CM | POA: Diagnosis not present

## 2015-04-02 DIAGNOSIS — R001 Bradycardia, unspecified: Secondary | ICD-10-CM | POA: Diagnosis not present

## 2015-04-02 DIAGNOSIS — N183 Chronic kidney disease, stage 3 (moderate): Secondary | ICD-10-CM | POA: Diagnosis not present

## 2015-04-02 DIAGNOSIS — I129 Hypertensive chronic kidney disease with stage 1 through stage 4 chronic kidney disease, or unspecified chronic kidney disease: Secondary | ICD-10-CM | POA: Diagnosis not present

## 2015-04-02 DIAGNOSIS — I739 Peripheral vascular disease, unspecified: Secondary | ICD-10-CM | POA: Diagnosis not present

## 2015-04-02 DIAGNOSIS — I4891 Unspecified atrial fibrillation: Secondary | ICD-10-CM | POA: Diagnosis not present

## 2015-04-04 DIAGNOSIS — I739 Peripheral vascular disease, unspecified: Secondary | ICD-10-CM | POA: Diagnosis not present

## 2015-04-04 DIAGNOSIS — I2510 Atherosclerotic heart disease of native coronary artery without angina pectoris: Secondary | ICD-10-CM | POA: Diagnosis not present

## 2015-04-04 DIAGNOSIS — I129 Hypertensive chronic kidney disease with stage 1 through stage 4 chronic kidney disease, or unspecified chronic kidney disease: Secondary | ICD-10-CM | POA: Diagnosis not present

## 2015-04-04 DIAGNOSIS — N183 Chronic kidney disease, stage 3 (moderate): Secondary | ICD-10-CM | POA: Diagnosis not present

## 2015-04-04 DIAGNOSIS — R001 Bradycardia, unspecified: Secondary | ICD-10-CM | POA: Diagnosis not present

## 2015-04-04 DIAGNOSIS — I4891 Unspecified atrial fibrillation: Secondary | ICD-10-CM | POA: Diagnosis not present

## 2015-04-09 DIAGNOSIS — I739 Peripheral vascular disease, unspecified: Secondary | ICD-10-CM | POA: Diagnosis not present

## 2015-04-09 DIAGNOSIS — I4891 Unspecified atrial fibrillation: Secondary | ICD-10-CM | POA: Diagnosis not present

## 2015-04-09 DIAGNOSIS — I129 Hypertensive chronic kidney disease with stage 1 through stage 4 chronic kidney disease, or unspecified chronic kidney disease: Secondary | ICD-10-CM | POA: Diagnosis not present

## 2015-04-09 DIAGNOSIS — I2510 Atherosclerotic heart disease of native coronary artery without angina pectoris: Secondary | ICD-10-CM | POA: Diagnosis not present

## 2015-04-09 DIAGNOSIS — N183 Chronic kidney disease, stage 3 (moderate): Secondary | ICD-10-CM | POA: Diagnosis not present

## 2015-04-09 DIAGNOSIS — R001 Bradycardia, unspecified: Secondary | ICD-10-CM | POA: Diagnosis not present

## 2015-04-12 DIAGNOSIS — I2510 Atherosclerotic heart disease of native coronary artery without angina pectoris: Secondary | ICD-10-CM | POA: Diagnosis not present

## 2015-04-12 DIAGNOSIS — I4891 Unspecified atrial fibrillation: Secondary | ICD-10-CM | POA: Diagnosis not present

## 2015-04-12 DIAGNOSIS — R001 Bradycardia, unspecified: Secondary | ICD-10-CM | POA: Diagnosis not present

## 2015-04-12 DIAGNOSIS — I129 Hypertensive chronic kidney disease with stage 1 through stage 4 chronic kidney disease, or unspecified chronic kidney disease: Secondary | ICD-10-CM | POA: Diagnosis not present

## 2015-04-12 DIAGNOSIS — I739 Peripheral vascular disease, unspecified: Secondary | ICD-10-CM | POA: Diagnosis not present

## 2015-04-12 DIAGNOSIS — N183 Chronic kidney disease, stage 3 (moderate): Secondary | ICD-10-CM | POA: Diagnosis not present

## 2015-04-15 DIAGNOSIS — I4891 Unspecified atrial fibrillation: Secondary | ICD-10-CM | POA: Diagnosis not present

## 2015-04-15 DIAGNOSIS — N183 Chronic kidney disease, stage 3 (moderate): Secondary | ICD-10-CM | POA: Diagnosis not present

## 2015-04-15 DIAGNOSIS — I129 Hypertensive chronic kidney disease with stage 1 through stage 4 chronic kidney disease, or unspecified chronic kidney disease: Secondary | ICD-10-CM | POA: Diagnosis not present

## 2015-04-15 DIAGNOSIS — R001 Bradycardia, unspecified: Secondary | ICD-10-CM | POA: Diagnosis not present

## 2015-04-15 DIAGNOSIS — I2510 Atherosclerotic heart disease of native coronary artery without angina pectoris: Secondary | ICD-10-CM | POA: Diagnosis not present

## 2015-04-15 DIAGNOSIS — I739 Peripheral vascular disease, unspecified: Secondary | ICD-10-CM | POA: Diagnosis not present

## 2015-04-18 DIAGNOSIS — N183 Chronic kidney disease, stage 3 (moderate): Secondary | ICD-10-CM | POA: Diagnosis not present

## 2015-04-18 DIAGNOSIS — I739 Peripheral vascular disease, unspecified: Secondary | ICD-10-CM | POA: Diagnosis not present

## 2015-04-18 DIAGNOSIS — I4891 Unspecified atrial fibrillation: Secondary | ICD-10-CM | POA: Diagnosis not present

## 2015-04-18 DIAGNOSIS — R001 Bradycardia, unspecified: Secondary | ICD-10-CM | POA: Diagnosis not present

## 2015-04-18 DIAGNOSIS — I2510 Atherosclerotic heart disease of native coronary artery without angina pectoris: Secondary | ICD-10-CM | POA: Diagnosis not present

## 2015-04-18 DIAGNOSIS — I129 Hypertensive chronic kidney disease with stage 1 through stage 4 chronic kidney disease, or unspecified chronic kidney disease: Secondary | ICD-10-CM | POA: Diagnosis not present

## 2015-04-20 DIAGNOSIS — G629 Polyneuropathy, unspecified: Secondary | ICD-10-CM | POA: Diagnosis not present

## 2015-04-20 DIAGNOSIS — Z6827 Body mass index (BMI) 27.0-27.9, adult: Secondary | ICD-10-CM | POA: Diagnosis not present

## 2015-04-20 DIAGNOSIS — I10 Essential (primary) hypertension: Secondary | ICD-10-CM | POA: Diagnosis not present

## 2015-04-20 DIAGNOSIS — R079 Chest pain, unspecified: Secondary | ICD-10-CM | POA: Diagnosis not present

## 2015-04-20 DIAGNOSIS — E784 Other hyperlipidemia: Secondary | ICD-10-CM | POA: Diagnosis not present

## 2015-04-20 DIAGNOSIS — I252 Old myocardial infarction: Secondary | ICD-10-CM | POA: Diagnosis not present

## 2015-04-20 DIAGNOSIS — I2510 Atherosclerotic heart disease of native coronary artery without angina pectoris: Secondary | ICD-10-CM | POA: Diagnosis not present

## 2015-04-22 ENCOUNTER — Other Ambulatory Visit: Payer: Self-pay | Admitting: Cardiovascular Disease

## 2015-04-22 ENCOUNTER — Other Ambulatory Visit: Payer: Self-pay | Admitting: *Deleted

## 2015-04-23 ENCOUNTER — Other Ambulatory Visit: Payer: Self-pay | Admitting: Cardiovascular Disease

## 2015-04-24 ENCOUNTER — Other Ambulatory Visit: Payer: Self-pay

## 2015-05-06 ENCOUNTER — Ambulatory Visit (INDEPENDENT_AMBULATORY_CARE_PROVIDER_SITE_OTHER): Payer: Medicare Other | Admitting: Cardiovascular Disease

## 2015-05-06 ENCOUNTER — Encounter: Payer: Self-pay | Admitting: Cardiovascular Disease

## 2015-05-06 VITALS — BP 132/80 | HR 64 | Ht 70.0 in | Wt 199.4 lb

## 2015-05-06 DIAGNOSIS — I2510 Atherosclerotic heart disease of native coronary artery without angina pectoris: Secondary | ICD-10-CM | POA: Diagnosis not present

## 2015-05-06 DIAGNOSIS — I482 Chronic atrial fibrillation, unspecified: Secondary | ICD-10-CM

## 2015-05-06 MED ORDER — DABIGATRAN ETEXILATE MESYLATE 150 MG PO CAPS: 150.0000 mg | ORAL_CAPSULE | Freq: Two times a day (BID) | ORAL | Status: DC

## 2015-05-06 NOTE — Patient Instructions (Addendum)
Medication Instructions:  Your physician has recommended you make the following change in your medication:  1. STOP Eliquis 2. START Pradaxa 150mg  take one tablet by mouth twice a day  Labwork: No new orders.   Testing/Procedures: No new orders.   Follow-Up: Your physician recommends that you schedule a follow-up appointment in: 6 MONTHS with Truitt Merle NP  Your physician wants you to follow-up in: 1 YEAR with Dr Burt Knack.  You will receive a reminder letter in the mail two months in advance. If you don't receive a letter, please call our office to schedule the follow-up appointment.  Any Other Special Instructions Will Be Listed Below (If Applicable).     If you need a refill on your cardiac medications before your next appointment, please call your pharmacy.

## 2015-05-06 NOTE — Progress Notes (Signed)
Cardiology Office Note Date:  05/06/2015   ID:  JHONEN KOSIBA, DOB 1940-03-14, MRN [id number]  PCP:  Marton Redwood, MD  Cardiologist:  Sherren Mocha, MD    Chief Complaint  Patient presents with  . Scheduled Office Visit    Essential Hypertension. Denies any cp, sob, lee, or claudication     History of Present Illness: [name] is a 75 y.o. male who presents for follow-up of chronic atrial fibrillation and CAD. He has a history of stroke and CHADS-Vasc = 6 (age 62, stroke 2, vascular disease 1, HTN 1). He was hospitalized in January with chest pain and ruled out for MI. A nuclear stress test showed primarily fixed defects in the inferolateral and apical walls without major ischemia. This was interpreted as 'low-risk' and ongoing medical therapy was recommended.   The patient reports no new problems. He's had no recurrence of chest pain. He denies shortness of breath, edema, orthopnea, PND, lightheadedness, or heart palpitations. There were some changes to his insurance in January and Eliquis is now costing him too $200 per month out of pocket.   Past Medical History  Diagnosis Date  . 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 Hypertension   . Bradycardia   . Dyslipidemia   . Tobacco abuse   . CVA (cerebral vascular accident) ([place])   . History of benign prostatic hypertrophy   . GERD (gastroesophageal reflux disease)   . Anxiety   . History of leukocytosis   . Neuropathy ([place])   . COPD (chronic obstructive pulmonary disease) ([place])   . Peripheral vascular disease ([place])   . 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 MI, old   . Foot pain   . DVT (deep venous thrombosis) ([place])   . Carotid artery occlusion   . Atrial fibrillation ([place])   . Depression   . Colon polyps   . Common bile duct stone   . Cataract     bilataeral cateracts removed  . Neuromuscular disorder (HCC)     neuropathy  . CKD (chronic kidney disease),  stage III   . Essential hypertension     Past Surgical History  Procedure Laterality Date  . Intervention of the rca      using a single BMS  . Ankle surgery    . Bypass graft  2009 or  2010    stent  . Cholecystectomy      Gall Bladder  . Colonoscopy      Current Outpatient Prescriptions  Medication Sig Dispense Refill  . apixaban (ELIQUIS) 5 MG TABS tablet TAKE 1 TABLET (5 MG TOTAL) BY MOUTH 2 (TWO) TIMES DAILY. 60 tablet 6  . diazepam (VALIUM) 5 MG tablet Take 7.5 mg by mouth daily.      . finasteride (PROSCAR) 5 MG tablet Take 5 mg by mouth daily.     . furosemide (LASIX) 20 MG tablet Take 20 mg by mouth daily.     Marland Kitchen HYDROcodone-acetaminophen (NORCO) 7.5-325 MG tablet Take 1 tablet by mouth every 6 (six) hours as needed. for pain  0  . isosorbide mononitrate (IMDUR) 30 MG 24 hr tablet TAKE 1 TABLET BY MOUTH EVERY DAY 30 tablet 01  . lisinopril (PRINIVIL,ZESTRIL) 5 MG tablet Take 1 tablet (5 mg total) by mouth daily. 30 tablet 11  . lubiprostone (AMITIZA) 8 MCG capsule Take 1 capsule (8 mcg total) by mouth 2 (two) times  daily with a meal. 60 capsule 1  . nitroGLYCERIN (NITROSTAT) 0.4 MG SL tablet Place 0.4 mg under the tongue every 5 (five) minutes as needed for chest pain.    Marland Kitchen omeprazole (PRILOSEC) 40 MG capsule Take 1 capsule (40 mg total) by mouth daily. 90 capsule 3  . potassium chloride SA (KLOR-CON M20) 20 MEQ tablet TAKE 1 TABLET (20 MEQ TOTAL) BY MOUTH DAILY. 90 tablet 0  . risperiDONE (RISPERDAL) 0.5 MG tablet Take 0.5 mg by mouth daily.      . sertraline (ZOLOFT) 100 MG tablet Take 100 mg by mouth daily.    . simvastatin (ZOCOR) 40 MG tablet TAKE 1 TABLET BY MOUTH AT BEDTIME 30 tablet 1   No current facility-administered medications for this visit.    Allergies:   Hctz; Amitriptyline; Gabapentin; Metoprolol tartrate; Paroxetine; and Spiriva handihaler   Social History:  The patient  reports that he has been smoking Cigarettes and E-cigarettes.  He started smoking  about 2 years ago. He has been smoking about 0.50 packs per day. He quit smokeless tobacco use about 2 years ago. He reports that he does not drink alcohol or use illicit drugs.   Family History:  The patient's  family history includes Cancer in his brother; Heart attack in his brother, father, and mother; Heart disease in his brother and brother; Hypertension in his brother, father, and mother; Stroke in his mother. There is no history of Colon cancer, Esophageal cancer, Rectal cancer, or Stomach cancer.    ROS:  Please see the history of present illness.  Otherwise, review of systems is positive for Fatigue.  All other systems are reviewed and negative.    PHYSICAL EXAM: VS:  BP 132/80 mmHg  Pulse 64  Ht 5\' 10"  (1.778 m)  Wt 199 lb 6.4 oz (90.447 kg)  BMI 28.61 kg/m2 , BMI Body mass index is 28.61 kg/(m^2). GEN: Well nourished, well developed, in no acute distress HEENT: Chronic right facial droop Neck: no JVD, no masses. No carotid bruits Cardiac: irregularly irregular without murmur or gallop            Respiratory:  clear to auscultation bilaterally, normal work of breathing GI: soft, nontender, nondistended, + BS MS: no deformity or atrophy Ext: no pretibial edema Skin: warm and dry, no rash Neuro:  Strength and sensation are intact Psych: euthymic mood, full affect  EKG:  EKG is ordered today. The ekg ordered today shows atrial fibrillation with premature aberrantly conducted complexes 77 bpm  Recent Labs: 08/01/2014: Magnesium 2.0; Pro B Natriuretic peptide (BNP) 128.0* 03/21/2015: B Natriuretic Peptide 70.2; BUN 11; Creatinine, Ser 1.55*; Hemoglobin 12.7*; Platelets 218; Potassium 3.8; Sodium 144   Lipid Panel     Component Value Date/Time   CHOL 74 03/21/2015 0533   TRIG 50 03/21/2015 0533   HDL 29* 03/21/2015 0533   CHOLHDL 2.6 03/21/2015 0533   VLDL 10 03/21/2015 0533   LDLCALC 35 03/21/2015 0533      Wt Readings from Last 3 Encounters:  05/06/15 199 lb 6.4 oz  (90.447 kg)  04/01/15 198 lb (89.812 kg)  03/28/15 193 lb (87.544 kg)     Cardiac Studies Reviewed: Myoview Study Highlights from 03/28/2015     Nuclear stress EF: 50%.  There was no ST segment deviation noted during stress.  The left ventricular ejection fraction is mildly decreased (45-54%).  This is a low risk study.  Low risk stress nuclear study with small, severe, fixed apical defect and large, severe,  partially reversible inferolateral defect; findings consistent with apical thinning and prior inferolateral infarct and mild peri-infarct ischemia; EF 50 with akinesis of the basal inferolateral wall.    Echo Study Conclusions from 02/2015  - Left ventricle: The cavity size was normal. Wall thickness was normal. Systolic function was mildly reduced. The estimated ejection fraction was in the range of 45% to 50%. Hypokinesis of the basal-midinferolateral, inferior, and inferoseptal myocardium. - Aortic valve: There was trivial regurgitation.  LHC 2/11 LAD: Mid 40, dist 30 LCx: Dist 50 RCA: prox 50 at edge of stent R iliac occluded; L iliac 80 EF 50-55%       ASSESSMENT AND PLAN: 1. Chest pain - Now resolved. Nuclear scan and echo reviewed. Will continue medical management of CAD  2. Chronic atrial fibrillation: CHADS-VASC = 5. Eliquis cost now a major issue. Will change to Pradaxa 150 mg BID - suspect this will be preferred by his insurance.   3. Chronic anticoagulation - sas above. Will transition Eliquis to Pradaxa.   4. CKD III - tolerating an ACE-inhibitor  5. Tobacco abuse - ongoing. Not ready to stop  Current medicines are reviewed with the patient today.  The patient does not have concerns regarding medicines.  Labs/ tests ordered today include:  No orders of the defined types were placed in this encounter.    Disposition:   FU 6 months with Truitt Merle, NP  Signed, Sherren Mocha, MD  05/06/2015 8:54 AM    [facility] [place], [place], [place]  [address] Phone: [phone]; Fax: [phone]

## 2015-05-07 ENCOUNTER — Telehealth: Payer: Self-pay | Admitting: Cardiovascular Disease

## 2015-05-07 NOTE — Telephone Encounter (Signed)
I spoke with the pt's daughter and answered her questions in regards to the pt's appointment yesterday.  She said the pt may have misunderstood something that was discussed during his office visit. The pt's EF has slightly decreased from 08/09/13 Echo 50-55% to 03/22/15 45-50%.  The pt's 03/28/15 myoview estimated EF at 45-54%.  I made her aware of recommended change from Eliquis to Pradaxa due to cost. She will contact the office if cost is an issue with Pradaxa.

## 2015-05-07 NOTE — Telephone Encounter (Signed)
New message      Pt want to know if "his heart has gotten worse"?  He asked his daughter to call and ask that question.  He was seen yesterday.  Daughter said pt did not say why he wanted to know that.

## 2015-05-21 ENCOUNTER — Other Ambulatory Visit: Payer: Self-pay | Admitting: Cardiovascular Disease

## 2015-06-05 ENCOUNTER — Other Ambulatory Visit: Payer: Self-pay | Admitting: Cardiovascular Disease

## 2015-06-05 ENCOUNTER — Other Ambulatory Visit: Payer: Self-pay | Admitting: *Deleted

## 2015-06-05 MED ORDER — NITROGLYCERIN 0.4 MG SL SUBL: 0.4000 mg | SUBLINGUAL_TABLET | SUBLINGUAL | Status: DC | PRN

## 2015-08-05 ENCOUNTER — Telehealth: Payer: Self-pay | Admitting: Cardiovascular Disease

## 2015-08-05 NOTE — Telephone Encounter (Signed)
I spoke with the pt's daughter [name] and the pt is complaining of fever and chills and feels this is related to Pradaxa.  I advised [name] that these symptoms are not coming from Pradaxa and can be a sign of infection.  She said the pt is having SOB and I advised her that the pt needs to see PCP ASAP for evaluation.  I told her the pt needs to continue pradaxa due to stroke risk.  She will contact the pt and make him aware of this information and she will also arrange evaluation with PCP this week. The pt is scheduled to follow-up with Dr Burt Knack on 08/12/15.

## 2015-08-05 NOTE — Telephone Encounter (Signed)
Pt got a replacement for Eliquis doesn't know the name-stopped taking it yesterday-due to fever and chills-dtr calling to see if ok he not be on blood thinner until his appt he made for 08-12-15 with Burt Knack? pls advise daughter [name] [phone]

## 2015-08-06 DIAGNOSIS — R531 Weakness: Secondary | ICD-10-CM | POA: Diagnosis not present

## 2015-08-06 DIAGNOSIS — R05 Cough: Secondary | ICD-10-CM | POA: Diagnosis not present

## 2015-08-06 DIAGNOSIS — Z125 Encounter for screening for malignant neoplasm of prostate: Secondary | ICD-10-CM | POA: Diagnosis not present

## 2015-08-06 DIAGNOSIS — I480 Paroxysmal atrial fibrillation: Secondary | ICD-10-CM | POA: Diagnosis not present

## 2015-08-06 DIAGNOSIS — R7301 Impaired fasting glucose: Secondary | ICD-10-CM | POA: Diagnosis not present

## 2015-08-06 DIAGNOSIS — Z6824 Body mass index (BMI) 24.0-24.9, adult: Secondary | ICD-10-CM | POA: Diagnosis not present

## 2015-08-06 DIAGNOSIS — E784 Other hyperlipidemia: Secondary | ICD-10-CM | POA: Diagnosis not present

## 2015-08-06 DIAGNOSIS — I10 Essential (primary) hypertension: Secondary | ICD-10-CM | POA: Diagnosis not present

## 2015-08-06 DIAGNOSIS — R634 Abnormal weight loss: Secondary | ICD-10-CM | POA: Diagnosis not present

## 2015-08-12 ENCOUNTER — Encounter: Payer: Self-pay | Admitting: Cardiovascular Disease

## 2015-08-12 ENCOUNTER — Ambulatory Visit: Payer: Medicare Other | Admitting: Cardiovascular Disease

## 2015-08-12 ENCOUNTER — Ambulatory Visit (INDEPENDENT_AMBULATORY_CARE_PROVIDER_SITE_OTHER): Payer: Medicare Other | Admitting: Cardiovascular Disease

## 2015-08-12 VITALS — BP 130/70 | HR 88 | Ht 70.0 in | Wt 178.0 lb

## 2015-08-12 DIAGNOSIS — I482 Chronic atrial fibrillation, unspecified: Secondary | ICD-10-CM

## 2015-08-12 DIAGNOSIS — I2510 Atherosclerotic heart disease of native coronary artery without angina pectoris: Secondary | ICD-10-CM | POA: Diagnosis not present

## 2015-08-12 NOTE — Progress Notes (Signed)
Cardiology Office Note Date:  08/12/2015   ID:  WENDAL BICKSLER, DOB [birth year], MRN [id number]  PCP:  Marton Redwood, MD  Cardiologist:  Sherren Mocha, MD    Chief Complaint  Patient presents with  . Hypertension     History of Present Illness: [name] is a 75 y.o. male who presents for follow-up of chronic atrial fibrillation and CAD. He has a history of stroke and CHADS-Vasc = 6 (age 40, stroke 2, vascular disease 1, HTN 1). He was hospitalized in January with chest pain and ruled out for MI. A nuclear stress test showed primarily fixed defects in the inferolateral and apical walls without major ischemia. This was interpreted as 'low-risk' and ongoing medical therapy was recommended.   The patient is not doing very well. He complains of 20 pound weight loss in the last 3 months. States his appetite is poor. Also complains of difficulty swallowing. He's had multiple esophageal dilatations done. He complains of generalized weakness. He was seen at the [place] office last week and labs were drawn. He has a follow-up appointment later this week with Dr. Brigitte Pulse. He complains of shortness of breath. No orthopnea or PND. No leg swelling. No recent chest pain. He continues to smoke.  Past Medical History  Diagnosis Date  . 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 Hypertension   . Bradycardia   . Dyslipidemia   . Tobacco abuse   . CVA (cerebral vascular accident) ([place])   . History of benign prostatic hypertrophy   . GERD (gastroesophageal reflux disease)   . Anxiety   . History of leukocytosis   . Neuropathy ([place])   . COPD (chronic obstructive pulmonary disease) ([place])   . Peripheral vascular disease ([place])   . 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 MI, old   . Foot pain   . DVT (deep venous thrombosis) ([place])   . Carotid artery occlusion   . Atrial fibrillation ([place])   . Depression   . Colon polyps   . Common bile  duct stone   . Cataract     bilataeral cateracts removed  . Neuromuscular disorder ([facility])     neuropathy  . CKD (chronic kidney disease), stage III   . Essential hypertension     Past Surgical History  Procedure Laterality Date  . Intervention of the rca      using a single BMS  . Ankle surgery    . Bypass graft  2009 or  2010    stent  . Cholecystectomy      Gall Bladder  . Colonoscopy      Current Outpatient Prescriptions  Medication Sig Dispense Refill  . apixaban (ELIQUIS) 5 MG TABS tablet Take 5 mg by mouth 2 (two) times daily.    . diazepam (VALIUM) 5 MG tablet Take 7.5 mg by mouth daily.      . finasteride (PROSCAR) 5 MG tablet Take 5 mg by mouth daily.     . furosemide (LASIX) 20 MG tablet Take 20 mg by mouth daily.     Marland Kitchen HYDROcodone-acetaminophen (NORCO) 7.5-325 MG tablet Take 1 tablet by mouth every 6 (six) hours as needed. for pain  0  . isosorbide mononitrate (IMDUR) 30 MG 24 hr tablet TAKE 1 TABLET BY MOUTH EVERY DAY 30 tablet 11  . lisinopril (PRINIVIL,ZESTRIL) 5 MG tablet Take 1 tablet (5 mg total) by mouth daily. [place]  tablet 11  . lubiprostone (AMITIZA) 8 MCG capsule Take 1 capsule (8 mcg total) by mouth 2 (two) times daily with a meal. 60 capsule 1  . nitroGLYCERIN (NITROSTAT) 0.4 MG SL tablet Place 1 tablet (0.4 mg total) under the tongue every 5 (five) minutes as needed for chest pain. 25 tablet 5  . omeprazole (PRILOSEC) 40 MG capsule Take 1 capsule (40 mg total) by mouth daily. 90 capsule 3  . potassium chloride SA (KLOR-CON M20) 20 MEQ tablet TAKE 1 TABLET (20 MEQ TOTAL) BY MOUTH DAILY. 90 tablet 0  . risperiDONE (RISPERDAL) 0.5 MG tablet Take 0.5 mg by mouth daily.      . sertraline (ZOLOFT) 100 MG tablet Take 100 mg by mouth daily.    . simvastatin (ZOCOR) 40 MG tablet TAKE 1 TABLET BY MOUTH AT BEDTIME 30 tablet 11   No current facility-administered medications for this visit.    Allergies:   Hctz; Amitriptyline; Gabapentin; Metoprolol tartrate;  Paroxetine; and Spiriva handihaler   Social History:  The patient  reports that he has been smoking Cigarettes and E-cigarettes.  He started smoking about 3 years ago. He has been smoking about 0.50 packs per day. He quit smokeless tobacco use about 2 years ago. He reports that he does not drink alcohol or use illicit drugs.   Family History:  The patient's  family history includes Cancer in his brother; Heart attack in his brother, father, and mother; Heart disease in his brother and brother; Hypertension in his brother, father, and mother; Stroke in his mother. There is no history of Colon cancer, Esophageal cancer, Rectal cancer, or Stomach cancer.    ROS:  Please see the history of present illness.  Otherwise, review of systems is positive for weight loss, poor appetite, chills, abdominal pain, depression, rash, dizziness, easy bruising, excessive sweating, excessive fatigue, recent fever, leg pain, anxiety, difficulty urinating, balance problems, headaches.  All other systems are reviewed and negative.    PHYSICAL EXAM: VS:  BP 130/70 mmHg  Pulse 88  Ht 5\' 10"  (1.778 m)  Wt 178 lb (80.74 kg)  BMI 25.54 kg/m2 , BMI Body mass index is 25.54 kg/(m^2). GEN: Chronically ill-appearing male, in a wheelchair, in no acute distress HEENT: normal Neck: no JVD, no masses. No carotid bruits Cardiac: Irregularly irregular without murmur or gallop            Respiratory:  clear to auscultation bilaterally, normal work of breathing GI: soft, nontender, nondistended, + BS MS: no deformity or atrophy Ext: no pretibial edema Skin: warm and dry, no rash Psych: euthymic mood, full affect  EKG:  EKG is not ordered today. Recent Labs: 03/21/2015: B Natriuretic Peptide 70.2; BUN 11; Creatinine, Ser 1.55*; Hemoglobin 12.7*; Platelets 218; Potassium 3.8; Sodium 144   Lipid Panel     Component Value Date/Time   CHOL 74 03/21/2015 0533   TRIG 50 03/21/2015 0533   HDL 29* 03/21/2015 0533   CHOLHDL 2.6  03/21/2015 0533   VLDL 10 03/21/2015 0533   LDLCALC 35 03/21/2015 0533      Wt Readings from Last 3 Encounters:  08/12/15 178 lb (80.74 kg)  05/06/15 199 lb 6.4 oz (90.447 kg)  04/01/15 198 lb (89.812 kg)     Cardiac Studies Reviewed: Myoview Scan 03-28-2015: Study Highlights     Nuclear stress EF: 50%.  There was no ST segment deviation noted during stress.  The left ventricular ejection fraction is mildly decreased (45-54%).  This is a low risk  study.  Low risk stress nuclear study with small, severe, fixed apical defect and large, severe, partially reversible inferolateral defect; findings consistent with apical thinning and prior inferolateral infarct and mild peri-infarct ischemia; EF 50 with akinesis of the basal inferolateral wall.   2d Echo 03-22-2015: Study Conclusions  - Left ventricle: The cavity size was normal. Wall thickness was  normal. Systolic function was mildly reduced. The estimated  ejection fraction was in the range of 45% to 50%. Hypokinesis of  the basal-midinferolateral, inferior, and inferoseptal  myocardium. - Aortic valve: There was trivial regurgitation.  ASSESSMENT AND PLAN: 1.  Chronic atrial fibrillation: CHADS-Vasc score is 84 (age 77, stroke 2, coronary disease, hypertension).  Currently on Eliquis via samples. Will give more samples today if available. May ultimately need to switch back to Pradaxa for cost.   2. CAD, native vessel: no angina at present. Recent nuclear scan reviewed and low-risk.  3. CKD 3: labs last week at Dr Raul Del office, with follow-up later this week.   4. Tobacco abuse: counseling done. He is not going to quit.   5. Weight loss/failure to thrive: evaluation per Dr Brigitte Pulse.  Current medicines are reviewed with the patient today.  The patient does not have concerns regarding medicines.  Labs/ tests ordered today include:  No orders of the defined types were placed in this encounter.    Disposition:   FU 3  months with Truitt Merle as scheduled.   Deatra James, MD  08/12/2015 4:44 PM    [facility] [place], [place],   [address] Phone: [phone]; Fax: [phone]

## 2015-08-12 NOTE — Patient Instructions (Addendum)
Medication Instructions:  Your physician recommends that you continue on your current medications as directed. Please refer to the Current Medication list given to you today.  Labwork: No new orders.   Testing/Procedures: No new orders.   Follow-Up: Please keep your scheduled follow-up with Truitt Merle NP in September.   Your physician wants you to follow-up in: 6 MONTHS with Dr Burt Knack.  You will receive a reminder letter in the mail two months in advance. If you don't receive a letter, please call our office to schedule the follow-up appointment.   Any Other Special Instructions Will Be Listed Below (If Applicable).     If you need a refill on your cardiac medications before your next appointment, please call your pharmacy.

## 2015-08-13 ENCOUNTER — Other Ambulatory Visit: Payer: Self-pay | Admitting: Physician Assistant

## 2015-08-14 DIAGNOSIS — Z7189 Other specified counseling: Secondary | ICD-10-CM | POA: Diagnosis not present

## 2015-08-14 DIAGNOSIS — I480 Paroxysmal atrial fibrillation: Secondary | ICD-10-CM | POA: Diagnosis not present

## 2015-08-14 DIAGNOSIS — Z1389 Encounter for screening for other disorder: Secondary | ICD-10-CM | POA: Diagnosis not present

## 2015-08-14 DIAGNOSIS — Z8673 Personal history of transient ischemic attack (TIA), and cerebral infarction without residual deficits: Secondary | ICD-10-CM | POA: Diagnosis not present

## 2015-08-14 DIAGNOSIS — Z0000 Encounter for general adult medical examination without abnormal findings: Secondary | ICD-10-CM | POA: Diagnosis not present

## 2015-08-14 DIAGNOSIS — I739 Peripheral vascular disease, unspecified: Secondary | ICD-10-CM | POA: Diagnosis not present

## 2015-08-14 DIAGNOSIS — N390 Urinary tract infection, site not specified: Secondary | ICD-10-CM | POA: Diagnosis not present

## 2015-08-14 DIAGNOSIS — Z6826 Body mass index (BMI) 26.0-26.9, adult: Secondary | ICD-10-CM | POA: Diagnosis not present

## 2015-08-14 DIAGNOSIS — I2510 Atherosclerotic heart disease of native coronary artery without angina pectoris: Secondary | ICD-10-CM | POA: Diagnosis not present

## 2015-08-14 DIAGNOSIS — J449 Chronic obstructive pulmonary disease, unspecified: Secondary | ICD-10-CM | POA: Diagnosis not present

## 2015-08-14 DIAGNOSIS — I252 Old myocardial infarction: Secondary | ICD-10-CM | POA: Diagnosis not present

## 2015-08-14 DIAGNOSIS — E784 Other hyperlipidemia: Secondary | ICD-10-CM | POA: Diagnosis not present

## 2015-08-14 DIAGNOSIS — I10 Essential (primary) hypertension: Secondary | ICD-10-CM | POA: Diagnosis not present

## 2015-08-20 ENCOUNTER — Encounter: Payer: Self-pay | Admitting: Cardiovascular Disease

## 2015-08-21 ENCOUNTER — Other Ambulatory Visit: Payer: Self-pay | Admitting: *Deleted

## 2015-08-21 DIAGNOSIS — R634 Abnormal weight loss: Secondary | ICD-10-CM | POA: Diagnosis not present

## 2015-08-21 DIAGNOSIS — Z7901 Long term (current) use of anticoagulants: Secondary | ICD-10-CM | POA: Diagnosis not present

## 2015-08-21 DIAGNOSIS — F329 Major depressive disorder, single episode, unspecified: Secondary | ICD-10-CM | POA: Diagnosis not present

## 2015-08-21 DIAGNOSIS — I739 Peripheral vascular disease, unspecified: Secondary | ICD-10-CM | POA: Diagnosis not present

## 2015-08-21 DIAGNOSIS — R627 Adult failure to thrive: Secondary | ICD-10-CM | POA: Diagnosis not present

## 2015-08-21 DIAGNOSIS — F17210 Nicotine dependence, cigarettes, uncomplicated: Secondary | ICD-10-CM | POA: Diagnosis not present

## 2015-08-21 DIAGNOSIS — E1142 Type 2 diabetes mellitus with diabetic polyneuropathy: Secondary | ICD-10-CM | POA: Diagnosis not present

## 2015-08-21 DIAGNOSIS — I480 Paroxysmal atrial fibrillation: Secondary | ICD-10-CM | POA: Diagnosis not present

## 2015-08-21 DIAGNOSIS — I252 Old myocardial infarction: Secondary | ICD-10-CM | POA: Diagnosis not present

## 2015-08-21 DIAGNOSIS — I10 Essential (primary) hypertension: Secondary | ICD-10-CM | POA: Diagnosis not present

## 2015-08-21 DIAGNOSIS — Z8673 Personal history of transient ischemic attack (TIA), and cerebral infarction without residual deficits: Secondary | ICD-10-CM | POA: Diagnosis not present

## 2015-08-21 DIAGNOSIS — I2510 Atherosclerotic heart disease of native coronary artery without angina pectoris: Secondary | ICD-10-CM | POA: Diagnosis not present

## 2015-08-21 DIAGNOSIS — I6529 Occlusion and stenosis of unspecified carotid artery: Secondary | ICD-10-CM | POA: Diagnosis not present

## 2015-08-21 DIAGNOSIS — R2689 Other abnormalities of gait and mobility: Secondary | ICD-10-CM | POA: Diagnosis not present

## 2015-08-21 DIAGNOSIS — M6281 Muscle weakness (generalized): Secondary | ICD-10-CM | POA: Diagnosis not present

## 2015-08-21 NOTE — Patient Outreach (Signed)
Pt referred from primary care office, Dr. Marton Redwood. I called and was able to speak with Mr. Awadallah. He said a nurse had just left but he could not tell me what her name was or the agency she was from. I told him I was referred from his MD for care management services and I asked if I could come visit him on Friday and he agreed. We scheduled this for 1:00 pm. Pt gave me his street address.  I will try to find out the home health agency that is visiting also.  [name] [facility] [place] [phone]

## 2015-08-22 DIAGNOSIS — R2689 Other abnormalities of gait and mobility: Secondary | ICD-10-CM | POA: Diagnosis not present

## 2015-08-22 DIAGNOSIS — M6281 Muscle weakness (generalized): Secondary | ICD-10-CM | POA: Diagnosis not present

## 2015-08-22 DIAGNOSIS — E1142 Type 2 diabetes mellitus with diabetic polyneuropathy: Secondary | ICD-10-CM | POA: Diagnosis not present

## 2015-08-22 DIAGNOSIS — R627 Adult failure to thrive: Secondary | ICD-10-CM | POA: Diagnosis not present

## 2015-08-22 DIAGNOSIS — R634 Abnormal weight loss: Secondary | ICD-10-CM | POA: Diagnosis not present

## 2015-08-22 DIAGNOSIS — F329 Major depressive disorder, single episode, unspecified: Secondary | ICD-10-CM | POA: Diagnosis not present

## 2015-08-23 ENCOUNTER — Other Ambulatory Visit: Payer: Self-pay | Admitting: *Deleted

## 2015-08-23 ENCOUNTER — Encounter: Payer: Self-pay | Admitting: *Deleted

## 2015-08-23 NOTE — Patient Outreach (Signed)
[name] [facility]) Care Management   08/23/2015  [name] [birth year] [id number]  [name] is an 75 y.o. male  Subjective: Pt referred to [facility] for Failure to Thrive and Weight Loss.  Initial visit completed today. [name] lives alone and is very isolated. He only goes out of his home for MD visits. He is able to get around well in his home. He is not able to get in his tub for a bath or shower. His neighbor brings him a meal a day and he drinks 2 protein supplement drinks. He has lost 24 pounds but feels his loss of appetite was due to a medication he was taken which has been stopped and he reports his appetite seems to be picking back up. He is going to be participating with an in home PT. He says his biggest problem is his severe peripheral neuropathy pain. He cannot take gabapentin and is taking hydrocodone/apap 7.5/325 tid. He also raises his legs and this seems to help some too. He has not tried any topical medication to augment his pain control. He states he needs his toenails cut.  Objective:   Review of Systems  Constitutional: Positive for weight loss.  Respiratory: Negative.   Cardiovascular: Positive for leg swelling.       Minimal edema.  Gastrointestinal: Negative.   Genitourinary: Negative.   Musculoskeletal:       Peripheral neuropathy.  Skin: Negative.   Neurological: Negative.   Endo/Heme/Allergies: Bruises/bleeds easily.  Psychiatric/Behavioral: Positive for depression.   BP 120/54 mmHg  Pulse 86  Resp 16  SpO2 96%  Physical Exam  Constitutional: He is oriented to person, place, and time. He appears well-developed and well-nourished.  HENT:  Head: Normocephalic and atraumatic.  Cardiovascular:  AFIB  Respiratory: Effort normal and breath sounds normal.  Current smoker, not interested in quitting.  Musculoskeletal:  Significant kyphosis.  Neurological: He is alert and oriented to person, place, and time.  Skin:  Skin is warm and dry.  Psychiatric:  Very depressed. On Zoloft 100 mg daily. Socially isolated.    Encounter Medications:   Outpatient Encounter Prescriptions as of 08/23/2015  Medication Sig Note  . apixaban (ELIQUIS) 5 MG TABS tablet Take 5 mg by mouth 2 (two) times daily.   . finasteride (PROSCAR) 5 MG tablet Take 5 mg by mouth daily.    . furosemide (LASIX) 20 MG tablet Take 20 mg by mouth daily.  08/23/2015: Pt takes this bid.  Marland Kitchen HYDROcodone-acetaminophen (NORCO) 7.5-325 MG tablet Take 1 tablet by mouth every 6 (six) hours as needed. for pain   . isosorbide mononitrate (IMDUR) 30 MG 24 hr tablet TAKE 1 TABLET BY MOUTH EVERY DAY   . KLOR-CON M20 20 MEQ tablet TAKE 1 TABLET DAILY   . lisinopril (PRINIVIL,ZESTRIL) 5 MG tablet Take 1 tablet (5 mg total) by mouth daily.   Marland Kitchen omeprazole (PRILOSEC) 40 MG capsule Take 1 capsule (40 mg total) by mouth daily.   . risperiDONE (RISPERDAL) 0.5 MG tablet Take 0.5 mg by mouth daily.     . sertraline (ZOLOFT) 100 MG tablet Take 100 mg by mouth daily.   . simvastatin (ZOCOR) 40 MG tablet TAKE 1 TABLET BY MOUTH AT BEDTIME   . diazepam (VALIUM) 5 MG tablet Take 7.5 mg by mouth daily. Reported on 08/23/2015 08/23/2015: Only takes 1-2 times a week.  . lubiprostone (AMITIZA) 8 MCG capsule Take 1 capsule (8 mcg total) by mouth 2 (two) times daily  with a meal. (Patient not taking: Reported on 08/23/2015) 08/23/2015: Rare constipation doesn't feel like he needs this.  . nitroGLYCERIN (NITROSTAT) 0.4 MG SL tablet Place 1 tablet (0.4 mg total) under the tongue every 5 (five) minutes as needed for chest pain. (Patient not taking: Reported on 08/23/2015)    No facility-administered encounter medications on file as of 08/23/2015.    Functional Status:   In your present state of health, do you have any difficulty performing the following activities: 08/23/2015 03/21/2015  Hearing? N N  Vision? Y N  Difficulty concentrating or making decisions? Y N  Walking or climbing  stairs? Y N  Dressing or bathing? Y N  Doing errands, shopping? [name]  Preparing Food and eating ? N -  Using the Toilet? N -  In the past six months, have you accidently leaked urine? N -  Do you have problems with loss of bowel control? N -  Managing your Medications? N -  Managing your Finances? Y -  Housekeeping or managing your Housekeeping? Y -    Fall/Depression Screening:    PHQ 2/9 Scores 08/23/2015  PHQ - 2 Score 3  PHQ- 9 Score 17   Fall Risk  08/23/2015  Falls in the past year? Yes  Number falls in past yr: 1  Injury with Fall? Yes  Risk Factor Category  High Fall Risk  Risk for fall due to : History of fall(s);Impaired balance/gait;Impaired mobility;Medication side effect  Follow up Falls evaluation completed;Education provided;Falls prevention discussed    Assessment:  Depression                         Recent wt loss                         Peripheral neuropathy  Plan: I will see pt next week and bring him the samples of [place] that I think may have a positive effect on his pain.  THN CM Care Plan Problem One        Most Recent Value   Care Plan Problem One  Weight loss   Role Documenting the Problem One  Care Management [employer] for Problem One  Active   THN Long Term Goal (31-90 days)  Pt will maintain and or gain some weight over the next 30 months.   THN Long Term Goal Start Date  08/23/15   Interventions for Problem One Long Term Goal  Discussed need for adequate nutritional intake, gave coupons for ensure, encouraged to try to add another small meal in addition to his one he currently eats and drinks 2 cans of ensure a day. I aksed pt if he would be willing to go to a congregational meal sight. He said he wasn't but I encouraged him to think about it as eating with others sometimes stimulates our appetitie and it would be good for him to get ourt.   THN CM Short Term Goal #1 (0-30 days)  Pt will try to eat an additional small meal each day a  few times a week over the next month.   THN CM Short Term Goal #1 Start Date  08/23/15   Interventions for Short Term Goal #1  Same as for long term goal.    [facility] CM Care Plan Problem Two        Most Recent Value   Care Plan Problem Two  Pain control  Role Documenting the Problem Two  Care Management Coordinator   Care Plan for Problem Two  Active   Interventions for Problem Two Long Term Goal   Discussed pain management    THN Long Term Goal (31-90) days  Pt will report improvoed pain management at the end of 90 days.   THN Long Term Goal Start Date  08/23/15   THN CM Short Term Goal #1 (0-30 days)  Pt to participate with PT over the next 30 days.   THN CM Short Term Goal #1 Start Date  08/23/15   Interventions for Short Term Goal #2   Pt will give 100% effort during PT sessions.   THN CM Short Term Goal #2 (0-30 days)  Pt to try topical ointment for imrpoved pain managment over the next 30 days.   THN CM Short Term Goal #2 Start Date  08/23/15   Interventions for Short Term Goal #2  Will provide pt some samples.     [name] [facility] [place] [phone]

## 2015-08-26 ENCOUNTER — Encounter: Payer: Self-pay | Admitting: *Deleted

## 2015-08-26 DIAGNOSIS — R634 Abnormal weight loss: Secondary | ICD-10-CM | POA: Diagnosis not present

## 2015-08-26 DIAGNOSIS — R627 Adult failure to thrive: Secondary | ICD-10-CM | POA: Diagnosis not present

## 2015-08-26 DIAGNOSIS — R2689 Other abnormalities of gait and mobility: Secondary | ICD-10-CM | POA: Diagnosis not present

## 2015-08-26 DIAGNOSIS — M6281 Muscle weakness (generalized): Secondary | ICD-10-CM | POA: Diagnosis not present

## 2015-08-26 DIAGNOSIS — F329 Major depressive disorder, single episode, unspecified: Secondary | ICD-10-CM | POA: Diagnosis not present

## 2015-08-26 DIAGNOSIS — E1142 Type 2 diabetes mellitus with diabetic polyneuropathy: Secondary | ICD-10-CM | POA: Diagnosis not present

## 2015-08-28 DIAGNOSIS — M6281 Muscle weakness (generalized): Secondary | ICD-10-CM | POA: Diagnosis not present

## 2015-08-28 DIAGNOSIS — E1142 Type 2 diabetes mellitus with diabetic polyneuropathy: Secondary | ICD-10-CM | POA: Diagnosis not present

## 2015-08-28 DIAGNOSIS — R627 Adult failure to thrive: Secondary | ICD-10-CM | POA: Diagnosis not present

## 2015-08-28 DIAGNOSIS — R2689 Other abnormalities of gait and mobility: Secondary | ICD-10-CM | POA: Diagnosis not present

## 2015-08-28 DIAGNOSIS — R634 Abnormal weight loss: Secondary | ICD-10-CM | POA: Diagnosis not present

## 2015-08-28 DIAGNOSIS — F329 Major depressive disorder, single episode, unspecified: Secondary | ICD-10-CM | POA: Diagnosis not present

## 2015-08-29 ENCOUNTER — Other Ambulatory Visit: Payer: Self-pay | Admitting: *Deleted

## 2015-08-29 DIAGNOSIS — R627 Adult failure to thrive: Secondary | ICD-10-CM | POA: Diagnosis not present

## 2015-08-29 DIAGNOSIS — R634 Abnormal weight loss: Secondary | ICD-10-CM | POA: Diagnosis not present

## 2015-08-29 DIAGNOSIS — R2689 Other abnormalities of gait and mobility: Secondary | ICD-10-CM | POA: Diagnosis not present

## 2015-08-29 DIAGNOSIS — F329 Major depressive disorder, single episode, unspecified: Secondary | ICD-10-CM | POA: Diagnosis not present

## 2015-08-29 DIAGNOSIS — M6281 Muscle weakness (generalized): Secondary | ICD-10-CM | POA: Diagnosis not present

## 2015-08-29 DIAGNOSIS — E1142 Type 2 diabetes mellitus with diabetic polyneuropathy: Secondary | ICD-10-CM | POA: Diagnosis not present

## 2015-08-29 NOTE — Patient Outreach (Signed)
[facility]) Care Management  08/29/2015  [name] 09-Nov-1940 [id number]  Brief home visit.  Pt is doing fair. He seemed very glad to see me. He denies any new problems.  O:  BP 130/70 mmHg  Pulse 70  Resp 18  SpO2 98%  A:  Failure to Thrive      Smoker      Social Isoloation/depression  P:  I provided pt with some sample of a topical lotion that is very good for pain. I massaged his lower extremities and instructed him he can stretch the small supply out by just applying it once a day for this trial period. He stated it feels very good.  I also provided him with a MOST form and he will discuss this with his daughter. He has told me he does not want to be resusitated.  I will see him again on Aug 4th. I reminded him he can call me anytime.  His neighbor stopped in while I was there and brought him a sandwich! He said he was his best friend!  [name] [facility] [phone]

## 2015-09-02 DIAGNOSIS — R2689 Other abnormalities of gait and mobility: Secondary | ICD-10-CM | POA: Diagnosis not present

## 2015-09-02 DIAGNOSIS — M6281 Muscle weakness (generalized): Secondary | ICD-10-CM | POA: Diagnosis not present

## 2015-09-02 DIAGNOSIS — R634 Abnormal weight loss: Secondary | ICD-10-CM | POA: Diagnosis not present

## 2015-09-02 DIAGNOSIS — F329 Major depressive disorder, single episode, unspecified: Secondary | ICD-10-CM | POA: Diagnosis not present

## 2015-09-02 DIAGNOSIS — E1142 Type 2 diabetes mellitus with diabetic polyneuropathy: Secondary | ICD-10-CM | POA: Diagnosis not present

## 2015-09-02 DIAGNOSIS — R627 Adult failure to thrive: Secondary | ICD-10-CM | POA: Diagnosis not present

## 2015-09-03 DIAGNOSIS — M6281 Muscle weakness (generalized): Secondary | ICD-10-CM | POA: Diagnosis not present

## 2015-09-03 DIAGNOSIS — R627 Adult failure to thrive: Secondary | ICD-10-CM | POA: Diagnosis not present

## 2015-09-03 DIAGNOSIS — F329 Major depressive disorder, single episode, unspecified: Secondary | ICD-10-CM | POA: Diagnosis not present

## 2015-09-03 DIAGNOSIS — R634 Abnormal weight loss: Secondary | ICD-10-CM | POA: Diagnosis not present

## 2015-09-03 DIAGNOSIS — E1142 Type 2 diabetes mellitus with diabetic polyneuropathy: Secondary | ICD-10-CM | POA: Diagnosis not present

## 2015-09-03 DIAGNOSIS — R2689 Other abnormalities of gait and mobility: Secondary | ICD-10-CM | POA: Diagnosis not present

## 2015-09-04 DIAGNOSIS — R2689 Other abnormalities of gait and mobility: Secondary | ICD-10-CM | POA: Diagnosis not present

## 2015-09-04 DIAGNOSIS — M6281 Muscle weakness (generalized): Secondary | ICD-10-CM | POA: Diagnosis not present

## 2015-09-04 DIAGNOSIS — R634 Abnormal weight loss: Secondary | ICD-10-CM | POA: Diagnosis not present

## 2015-09-04 DIAGNOSIS — F329 Major depressive disorder, single episode, unspecified: Secondary | ICD-10-CM | POA: Diagnosis not present

## 2015-09-04 DIAGNOSIS — E1142 Type 2 diabetes mellitus with diabetic polyneuropathy: Secondary | ICD-10-CM | POA: Diagnosis not present

## 2015-09-04 DIAGNOSIS — R627 Adult failure to thrive: Secondary | ICD-10-CM | POA: Diagnosis not present

## 2015-09-05 DIAGNOSIS — E1142 Type 2 diabetes mellitus with diabetic polyneuropathy: Secondary | ICD-10-CM | POA: Diagnosis not present

## 2015-09-05 DIAGNOSIS — M6281 Muscle weakness (generalized): Secondary | ICD-10-CM | POA: Diagnosis not present

## 2015-09-05 DIAGNOSIS — R634 Abnormal weight loss: Secondary | ICD-10-CM | POA: Diagnosis not present

## 2015-09-05 DIAGNOSIS — F329 Major depressive disorder, single episode, unspecified: Secondary | ICD-10-CM | POA: Diagnosis not present

## 2015-09-05 DIAGNOSIS — R2689 Other abnormalities of gait and mobility: Secondary | ICD-10-CM | POA: Diagnosis not present

## 2015-09-05 DIAGNOSIS — R627 Adult failure to thrive: Secondary | ICD-10-CM | POA: Diagnosis not present

## 2015-09-09 DIAGNOSIS — R634 Abnormal weight loss: Secondary | ICD-10-CM | POA: Diagnosis not present

## 2015-09-09 DIAGNOSIS — M6281 Muscle weakness (generalized): Secondary | ICD-10-CM | POA: Diagnosis not present

## 2015-09-09 DIAGNOSIS — R2689 Other abnormalities of gait and mobility: Secondary | ICD-10-CM | POA: Diagnosis not present

## 2015-09-09 DIAGNOSIS — F329 Major depressive disorder, single episode, unspecified: Secondary | ICD-10-CM | POA: Diagnosis not present

## 2015-09-09 DIAGNOSIS — E1142 Type 2 diabetes mellitus with diabetic polyneuropathy: Secondary | ICD-10-CM | POA: Diagnosis not present

## 2015-09-09 DIAGNOSIS — R627 Adult failure to thrive: Secondary | ICD-10-CM | POA: Diagnosis not present

## 2015-09-10 DIAGNOSIS — R3121 Asymptomatic microscopic hematuria: Secondary | ICD-10-CM | POA: Diagnosis not present

## 2015-09-11 DIAGNOSIS — R634 Abnormal weight loss: Secondary | ICD-10-CM | POA: Diagnosis not present

## 2015-09-11 DIAGNOSIS — R2689 Other abnormalities of gait and mobility: Secondary | ICD-10-CM | POA: Diagnosis not present

## 2015-09-11 DIAGNOSIS — F329 Major depressive disorder, single episode, unspecified: Secondary | ICD-10-CM | POA: Diagnosis not present

## 2015-09-11 DIAGNOSIS — E1142 Type 2 diabetes mellitus with diabetic polyneuropathy: Secondary | ICD-10-CM | POA: Diagnosis not present

## 2015-09-11 DIAGNOSIS — M6281 Muscle weakness (generalized): Secondary | ICD-10-CM | POA: Diagnosis not present

## 2015-09-11 DIAGNOSIS — R627 Adult failure to thrive: Secondary | ICD-10-CM | POA: Diagnosis not present

## 2015-09-13 DIAGNOSIS — F329 Major depressive disorder, single episode, unspecified: Secondary | ICD-10-CM | POA: Diagnosis not present

## 2015-09-13 DIAGNOSIS — R627 Adult failure to thrive: Secondary | ICD-10-CM | POA: Diagnosis not present

## 2015-09-13 DIAGNOSIS — R2689 Other abnormalities of gait and mobility: Secondary | ICD-10-CM | POA: Diagnosis not present

## 2015-09-13 DIAGNOSIS — E1142 Type 2 diabetes mellitus with diabetic polyneuropathy: Secondary | ICD-10-CM | POA: Diagnosis not present

## 2015-09-13 DIAGNOSIS — M6281 Muscle weakness (generalized): Secondary | ICD-10-CM | POA: Diagnosis not present

## 2015-09-13 DIAGNOSIS — R634 Abnormal weight loss: Secondary | ICD-10-CM | POA: Diagnosis not present

## 2015-09-16 DIAGNOSIS — F329 Major depressive disorder, single episode, unspecified: Secondary | ICD-10-CM | POA: Diagnosis not present

## 2015-09-16 DIAGNOSIS — R2689 Other abnormalities of gait and mobility: Secondary | ICD-10-CM | POA: Diagnosis not present

## 2015-09-16 DIAGNOSIS — R634 Abnormal weight loss: Secondary | ICD-10-CM | POA: Diagnosis not present

## 2015-09-16 DIAGNOSIS — R627 Adult failure to thrive: Secondary | ICD-10-CM | POA: Diagnosis not present

## 2015-09-16 DIAGNOSIS — M6281 Muscle weakness (generalized): Secondary | ICD-10-CM | POA: Diagnosis not present

## 2015-09-16 DIAGNOSIS — E1142 Type 2 diabetes mellitus with diabetic polyneuropathy: Secondary | ICD-10-CM | POA: Diagnosis not present

## 2015-09-19 DIAGNOSIS — N200 Calculus of kidney: Secondary | ICD-10-CM | POA: Diagnosis not present

## 2015-09-19 DIAGNOSIS — R3121 Asymptomatic microscopic hematuria: Secondary | ICD-10-CM | POA: Diagnosis not present

## 2015-09-20 DIAGNOSIS — E1142 Type 2 diabetes mellitus with diabetic polyneuropathy: Secondary | ICD-10-CM | POA: Diagnosis not present

## 2015-09-20 DIAGNOSIS — M6281 Muscle weakness (generalized): Secondary | ICD-10-CM | POA: Diagnosis not present

## 2015-09-20 DIAGNOSIS — F329 Major depressive disorder, single episode, unspecified: Secondary | ICD-10-CM | POA: Diagnosis not present

## 2015-09-20 DIAGNOSIS — R627 Adult failure to thrive: Secondary | ICD-10-CM | POA: Diagnosis not present

## 2015-09-20 DIAGNOSIS — R2689 Other abnormalities of gait and mobility: Secondary | ICD-10-CM | POA: Diagnosis not present

## 2015-09-20 DIAGNOSIS — R634 Abnormal weight loss: Secondary | ICD-10-CM | POA: Diagnosis not present

## 2015-09-24 DIAGNOSIS — R3121 Asymptomatic microscopic hematuria: Secondary | ICD-10-CM | POA: Diagnosis not present

## 2015-09-24 DIAGNOSIS — N358 Other urethral stricture: Secondary | ICD-10-CM | POA: Diagnosis not present

## 2015-09-24 DIAGNOSIS — N281 Cyst of kidney, acquired: Secondary | ICD-10-CM | POA: Diagnosis not present

## 2015-09-24 DIAGNOSIS — N200 Calculus of kidney: Secondary | ICD-10-CM | POA: Diagnosis not present

## 2015-09-25 DIAGNOSIS — I480 Paroxysmal atrial fibrillation: Secondary | ICD-10-CM | POA: Diagnosis not present

## 2015-09-25 DIAGNOSIS — R627 Adult failure to thrive: Secondary | ICD-10-CM | POA: Diagnosis not present

## 2015-09-25 DIAGNOSIS — R319 Hematuria, unspecified: Secondary | ICD-10-CM | POA: Diagnosis not present

## 2015-09-25 DIAGNOSIS — Z6828 Body mass index (BMI) 28.0-28.9, adult: Secondary | ICD-10-CM | POA: Diagnosis not present

## 2015-09-26 ENCOUNTER — Encounter (HOSPITAL_COMMUNITY): Payer: BLUE CROSS/BLUE SHIELD

## 2015-09-27 ENCOUNTER — Encounter: Payer: Self-pay | Admitting: *Deleted

## 2015-09-27 ENCOUNTER — Other Ambulatory Visit: Payer: Self-pay | Admitting: *Deleted

## 2015-09-27 NOTE — Patient Outreach (Signed)
[facility]) Care Management   09/27/2015  [name] 12/30/1940 [id number]  [name] is an 75 y.o. male  Subjective: Pt is doing fairly well. He reports he is doing the best he has in quite awhile. He attributes this to being taken of the Pradaxa. He is now on Eliquis. Dr. Raul Del office is trying to help pt get pharmacy assistance for this. He will follow up with his cardiologist in Sept. He has also been to the urologist, at [facility], had a CT of the abdomen because he had evidence of blood in his urine. The test was negative for any unusal findings. His appetite has improved and he has gained some wt. He has been drinking a nutritional supplement, from 2-4 per day. Pt says he is not interested in going to the nutritional site despite my encouragement.  Objective:   Review of Systems  Constitutional: Negative.   HENT: Negative.   Eyes: Positive for blurred vision.       Pt doesn't feel he needs to have an eye exam.  Respiratory:       Continues to smoke and he notes that since he had a stroke he has some difficulty with swallowing which is consistent since 1995.  Cardiovascular: Positive for leg swelling.       Edema if he does not keep his legs elevated.  Gastrointestinal: Positive for constipation.       Takes dulcolax tab prn with relief.  Genitourinary: Negative.   Musculoskeletal: Negative.   Skin: Negative.   Neurological: Negative.   Endo/Heme/Allergies: Bruises/bleeds easily.  Psychiatric/Behavioral: Negative.    BP (!) 90/50   Pulse 61   Resp 16   Wt 190 lb (86.2 kg)   SpO2 97%   BMI 27.26 kg/m   Physical Exam  Constitutional: He appears well-developed and well-nourished.  HENT:  Head: Normocephalic.  Cardiovascular:  AFIB.  Respiratory: Effort normal and breath sounds normal.  GI: Soft. Bowel sounds are normal.  Musculoskeletal: Normal range of motion.  Neurological: He is alert.  Skin: Skin is warm and dry.  Bruising  on forearms.  Psychiatric: He has a normal mood and affect.    Encounter Medications:   Outpatient Encounter Prescriptions as of 09/27/2015  Medication Sig Note  . apixaban (ELIQUIS) 5 MG TABS tablet Take 5 mg by mouth 2 (two) times daily.   . diazepam (VALIUM) 5 MG tablet Take 7.5 mg by mouth daily. Reported on 08/23/2015 08/23/2015: Only takes 1-2 times a week.  . finasteride (PROSCAR) 5 MG tablet Take 5 mg by mouth daily.    . furosemide (LASIX) 20 MG tablet Take 20 mg by mouth daily.  08/23/2015: Pt takes this bid.  Marland Kitchen HYDROcodone-acetaminophen (NORCO) 7.5-325 MG tablet Take 1 tablet by mouth every 6 (six) hours as needed. for pain   . isosorbide mononitrate (IMDUR) 30 MG 24 hr tablet TAKE 1 TABLET BY MOUTH EVERY DAY   . KLOR-CON M20 20 MEQ tablet TAKE 1 TABLET DAILY   . lisinopril (PRINIVIL,ZESTRIL) 5 MG tablet Take 1 tablet (5 mg total) by mouth daily.   Marland Kitchen lubiprostone (AMITIZA) 8 MCG capsule Take 1 capsule (8 mcg total) by mouth 2 (two) times daily with a meal. (Patient not taking: Reported on 08/23/2015) 08/23/2015: Rare constipation doesn't feel like he needs this.  . nitroGLYCERIN (NITROSTAT) 0.4 MG SL tablet Place 1 tablet (0.4 mg total) under the tongue every 5 (five) minutes as needed for chest pain. (Patient not taking:  Reported on 08/23/2015)   . omeprazole (PRILOSEC) 40 MG capsule Take 1 capsule (40 mg total) by mouth daily.   . risperiDONE (RISPERDAL) 0.5 MG tablet Take 0.5 mg by mouth daily.     . sertraline (ZOLOFT) 100 MG tablet Take 100 mg by mouth daily.   . simvastatin (ZOCOR) 40 MG tablet TAKE 1 TABLET BY MOUTH AT BEDTIME    No facility-administered encounter medications on file as of 09/27/2015.     Functional Status:   In your present state of health, do you have any difficulty performing the following activities: 08/23/2015 03/21/2015  Hearing? N N  Vision? Y N  Difficulty concentrating or making decisions? Y N  Walking or climbing stairs? Y N  Dressing or bathing? Y N   Doing errands, shopping? Tempie Donning  Preparing Food and eating ? N -  Using the Toilet? N -  In the past six months, have you accidently leaked urine? N -  Do you have problems with loss of bowel control? N -  Managing your Medications? N -  Managing your Finances? Y -  Housekeeping or managing your Housekeeping? Y -  Some recent data might be hidden    Fall/Depression Screening:    PHQ 2/9 Scores 08/23/2015  PHQ - 2 Score 3  PHQ- 9 Score 17    Assessment:  Improved general constitution.  Plan: Provided 8 Ensure coupons for $3 off each purchase.           Discontinue lisinopril.           Called in Rx for Simvistatin 40 mg (pt has been taking 2, 20 mg tabs.           Request Dr. Brigitte Pulse to consider Rx for gabapentin for peripheral neuropathy (pt                    reports the hydrocodone does well to help with this pain.           I will see pt one more time in September and then close his case.  [facility] CM Care Plan Problem One   Flowsheet Row Most Recent Value  Care Plan Problem One  (P) Weight loss  Role Documenting the Problem One  (P) Care Management [place] for Problem One  (P) Active  THN Long Term Goal (31-90 days)  (P) Pt will maintain and or gain some weight over the next 30 months.  THN Long Term Goal Start Date  (P) 08/23/15  Interventions for Problem One Long Term Goal  (P) Discussed need for adequate nutritional intake, gave coupons for ensure, encouraged to try to add another small meal in addition to his one he currently eats and drinks 2 cans of ensure a day. I aksed pt if he would be willing to go to a congregational meal sight. He said he wasn't but I encouraged him to think about it as eating with others sometimes stimulates our appetitie and it would be good for him to get ourt.  THN CM Short Term Goal #1 (0-30 days)  (P) Pt will try to eat an additional small meal each day a few times a week over the next month.  THN CM Short Term Goal #1 Start Date  (P)  08/23/15  Interventions for Short Term Goal #1  (P) Same as for long term goal.    [facility] CM Care Plan Problem Two   Flowsheet Row Most Recent Value  Care Plan  Problem Two  (P) Pain control  Role Documenting the Problem Two  (P) Care Management [place] for Problem Two  (P) Active  Interventions for Problem Two Long Term Goal   (P) Discussed pain management   THN Long Term Goal (31-90) days  (P) Pt will report improvoed pain management at the end of 90 days.  THN Long Term Goal Start Date  (P) 08/23/15  THN CM Short Term Goal #1 (0-30 days)  (P) Pt to participate with PT over the next 30 days.  THN CM Short Term Goal #1 Start Date  (P) 08/23/15  Interventions for Short Term Goal #2   (P) Pt will give 100% effort during PT sessions.  THN CM Short Term Goal #2 (0-30 days)  (P) Pt to try topical ointment for imrpoved pain managment over the next 30 days.  THN CM Short Term Goal #2 Start Date  (P) 08/23/15  Interventions for Short Term Goal #2  (P) Will provide pt some samples.     [name] [facility] [place] [phone]

## 2015-10-03 ENCOUNTER — Encounter: Payer: Self-pay | Admitting: Family

## 2015-10-03 ENCOUNTER — Ambulatory Visit: Payer: BLUE CROSS/BLUE SHIELD | Admitting: Family

## 2015-10-04 ENCOUNTER — Other Ambulatory Visit: Payer: Self-pay | Admitting: *Deleted

## 2015-10-04 DIAGNOSIS — I6523 Occlusion and stenosis of bilateral carotid arteries: Secondary | ICD-10-CM

## 2015-10-04 DIAGNOSIS — I739 Peripheral vascular disease, unspecified: Secondary | ICD-10-CM

## 2015-10-08 ENCOUNTER — Ambulatory Visit (INDEPENDENT_AMBULATORY_CARE_PROVIDER_SITE_OTHER): Payer: Medicare Other | Admitting: Family

## 2015-10-08 ENCOUNTER — Ambulatory Visit (HOSPITAL_COMMUNITY)
Admission: RE | Admit: 2015-10-08 | Discharge: 2015-10-08 | Disposition: A | Discharge status: Home / Self Care | Payer: Medicare Other | Source: Ambulatory Visit | Attending: Vascular Surgery | Admitting: Vascular Surgery

## 2015-10-08 ENCOUNTER — Encounter: Payer: Self-pay | Admitting: Family

## 2015-10-08 VITALS — BP 156/86 | HR 86 | Ht 70.0 in | Wt 190.3 lb

## 2015-10-08 DIAGNOSIS — J449 Chronic obstructive pulmonary disease, unspecified: Secondary | ICD-10-CM | POA: Insufficient documentation

## 2015-10-08 DIAGNOSIS — I6523 Occlusion and stenosis of bilateral carotid arteries: Secondary | ICD-10-CM

## 2015-10-08 DIAGNOSIS — Z8673 Personal history of transient ischemic attack (TIA), and cerebral infarction without residual deficits: Secondary | ICD-10-CM | POA: Diagnosis not present

## 2015-10-08 DIAGNOSIS — E785 Hyperlipidemia, unspecified: Secondary | ICD-10-CM | POA: Insufficient documentation

## 2015-10-08 DIAGNOSIS — I779 Disorder of arteries and arterioles, unspecified: Secondary | ICD-10-CM | POA: Diagnosis not present

## 2015-10-08 DIAGNOSIS — I129 Hypertensive chronic kidney disease with stage 1 through stage 4 chronic kidney disease, or unspecified chronic kidney disease: Secondary | ICD-10-CM | POA: Insufficient documentation

## 2015-10-08 DIAGNOSIS — Z87891 Personal history of nicotine dependence: Secondary | ICD-10-CM

## 2015-10-08 DIAGNOSIS — F419 Anxiety disorder, unspecified: Secondary | ICD-10-CM | POA: Diagnosis not present

## 2015-10-08 DIAGNOSIS — F329 Major depressive disorder, single episode, unspecified: Secondary | ICD-10-CM | POA: Insufficient documentation

## 2015-10-08 DIAGNOSIS — R938 Abnormal findings on diagnostic imaging of other specified body structures: Secondary | ICD-10-CM | POA: Diagnosis not present

## 2015-10-08 DIAGNOSIS — K219 Gastro-esophageal reflux disease without esophagitis: Secondary | ICD-10-CM | POA: Insufficient documentation

## 2015-10-08 DIAGNOSIS — I2510 Atherosclerotic heart disease of native coronary artery without angina pectoris: Secondary | ICD-10-CM | POA: Insufficient documentation

## 2015-10-08 DIAGNOSIS — Z789 Other specified health status: Secondary | ICD-10-CM

## 2015-10-08 DIAGNOSIS — Z720 Tobacco use: Secondary | ICD-10-CM | POA: Diagnosis not present

## 2015-10-08 DIAGNOSIS — I739 Peripheral vascular disease, unspecified: Secondary | ICD-10-CM | POA: Diagnosis not present

## 2015-10-08 DIAGNOSIS — N183 Chronic kidney disease, stage 3 (moderate): Secondary | ICD-10-CM | POA: Diagnosis not present

## 2015-10-08 LAB — VAS US CAROTID
LEFT ECA DIAS: -5 cm/s
LEFT VERTEBRAL DIAS: -20 cm/s
Left CCA dist dias: -15 cm/s
Left CCA dist sys: -58 cm/s
Left CCA prox dias: 17 cm/s
Left CCA prox sys: 99 cm/s
Left ICA dist dias: -23 cm/s
Left ICA dist sys: -91 cm/s
RIGHT CCA MID DIAS: 9 cm/s
RIGHT ECA DIAS: -9 cm/s
RIGHT VERTEBRAL DIAS: -6 cm/s
Right CCA prox dias: 6 cm/s
Right CCA prox sys: 82 cm/s
Right cca dist sys: -70 cm/s

## 2015-10-08 NOTE — Progress Notes (Signed)
[facility] [place] HISTORY AND PHYSICAL   MRN : [id number]  History of Present Illness:   [name] is a 75 y.o. male patient of Dr. Oneida Alar who returns today for carotid artery surveillance and follow up of PAD. 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 He also has residual dysphagia, right facial droop, denies known monocular vision loss, denies much difficulty with expressive aphasia.  He stopped smoking in 2014, uses a nicotine vapor product. He is on Aspirin and Plavix for antiplatelet therapy. His atherosclerotic risk factors remain elevated cholesterol, hypertension, smoking, and coronary artery disease. These are all currently stable and followed by his primary care physician and his cardiolgist. He is trying to quit smoking and is currently on electronic cigarettes which he has weaned down to 12 mcg. He denies any new neurologic events including amaurosis, numbness, or weakness. He has also previously had a right external iliac stent and left common iliac stent in 2011. He denies claudication symptoms. 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  Pt states he has had episodes of balance problems for years.   He had ABI's and dopplers at Dr. Honor Junes office on 08/02/13 which are abnormal. Dr. Burt Knack indicated that he would be inclined to manage him medically as he has very limited functional capacity.  He is performing arm and seated leg exercises as discussed about 3x/week.  Pt states he does not walk much due to pain in feet, states he has swelling in ankles and lower legs, denies non healing wounds. He was started on coumadin after his stroke, then to Plavix and ASA, the started Eliquis and stopped the Plavix, per pt, managed by his cardiologist. He takes a daily statin.  Pt Diabetic: No Pt smoker: former smoker, quit in 2014, smokes 12% nicotine vapes, trying  to cut back   Pt meds include: Statin :Yes ASA: No Other anticoagulants/antiplatelets: Eliquis    Current Outpatient Prescriptions  Medication Sig Dispense Refill  . apixaban (ELIQUIS) 5 MG TABS tablet Take 5 mg by mouth 2 (two) times daily.    . diazepam (VALIUM) 5 MG tablet Take 7.5 mg by mouth daily. Reported on 08/23/2015    . finasteride (PROSCAR) 5 MG tablet Take 5 mg by mouth daily.     . furosemide (LASIX) 20 MG tablet Take 20 mg by mouth daily.     Marland Kitchen HYDROcodone-acetaminophen (NORCO) 7.5-325 MG tablet Take 1 tablet by mouth every 6 (six) hours as needed. for pain  0  . isosorbide mononitrate (IMDUR) 30 MG 24 hr tablet TAKE 1 TABLET BY MOUTH EVERY DAY 30 tablet 11  . KLOR-CON M20 20 MEQ tablet TAKE 1 TABLET DAILY 90 tablet 3  . lisinopril (PRINIVIL,ZESTRIL) 5 MG tablet Take 1 tablet (5 mg total) by mouth daily. 30 tablet 11  . nitroGLYCERIN (NITROSTAT) 0.4 MG SL tablet Place 1 tablet (0.4 mg total) under the tongue every 5 (five) minutes as needed for chest pain. 25 tablet 5  . omeprazole (PRILOSEC) 40 MG capsule Take 1 capsule (40 mg total) by mouth daily. 90 capsule 3  . risperiDONE (RISPERDAL) 0.5 MG tablet Take 0.5 mg by mouth daily.      . sertraline (ZOLOFT) 100 MG tablet Take 100 mg by mouth daily.    . simvastatin (ZOCOR) 40 MG tablet TAKE 1 TABLET BY MOUTH AT BEDTIME 30 tablet 11   No current  facility-administered medications for this visit.     Past Medical History:  Diagnosis Date  . Anxiety   . Atrial fibrillation ([place])   . Bradycardia   . Carotid artery occlusion   . Cataract    bilataeral cateracts removed  . CKD (chronic kidney disease), stage III   . Colon polyps   . Common bile duct stone   . COPD (chronic obstructive pulmonary disease) ([place])   . 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 CVA (cerebral vascular accident) ([place])   . Depression   . DVT (deep venous thrombosis) ([place])   . Dyslipidemia   .  Essential hypertension   . Foot pain   . GERD (gastroesophageal reflux disease)   . History of benign prostatic hypertrophy   . History of leukocytosis   . Hypertension   . MI, old   . Neuromuscular disorder (HCC)    neuropathy  . Neuropathy ([place])   . Peripheral vascular disease ([place])   . Renal failure, chronic    stage 111  . Tobacco abuse     Social History Social History  Substance Use Topics  . Smoking status: Current Every Day Smoker    Packs/day: 0.50    Types: Cigarettes, E-cigarettes    Start date: 07/20/2012    Last attempt to quit: 09/13/2012  . Smokeless tobacco: Former Systems developer    Quit date: 09/13/2012     Comment: currently using e-cigarettes and regular cigarettes.  . Alcohol use No    Family History Family History  Problem Relation Age of Onset  . Stroke Mother   . Hypertension Mother   . Heart attack Mother   . Heart attack Brother   . Cancer Brother   . Hypertension Father   . Heart attack Father   . Heart disease Brother     AAA  . Hypertension Brother   . Heart disease Brother   . Colon cancer Neg Hx   . Esophageal cancer Neg Hx   . Rectal cancer Neg Hx   . Stomach cancer Neg Hx     Surgical History Past Surgical History:  Procedure Laterality Date  . ANKLE SURGERY    . BYPASS GRAFT  2009 or  2010   stent  . CHOLECYSTECTOMY     Gall Bladder  . COLONOSCOPY    . intervention of the RCA     using a single BMS    Allergies  Allergen Reactions  . Hctz [Hydrochlorothiazide] Anaphylaxis and Other (See Comments)    Throat Swelling  . Amitriptyline Other (See Comments)    Made me go crazy  . Gabapentin Other (See Comments)    Made me go crazy  . Metoprolol Tartrate Other (See Comments)    fatigue  . Paroxetine Other (See Comments)    Made me go crazy  . Spiriva Handihaler [Tiotropium Bromide Monohydrate] Other (See Comments)    Urinary retention    Current Outpatient Prescriptions  Medication Sig Dispense Refill  . apixaban (ELIQUIS)  5 MG TABS tablet Take 5 mg by mouth 2 (two) times daily.    . diazepam (VALIUM) 5 MG tablet Take 7.5 mg by mouth daily. Reported on 08/23/2015    . finasteride (PROSCAR) 5 MG tablet Take 5 mg by mouth daily.     . furosemide (LASIX) 20 MG tablet Take 20 mg by mouth daily.     Marland Kitchen HYDROcodone-acetaminophen (NORCO) 7.5-325 MG tablet Take 1 tablet by mouth  every 6 (six) hours as needed. for pain  0  . isosorbide mononitrate (IMDUR) 30 MG 24 hr tablet TAKE 1 TABLET BY MOUTH EVERY DAY 30 tablet 11  . KLOR-CON M20 20 MEQ tablet TAKE 1 TABLET DAILY 90 tablet 3  . lisinopril (PRINIVIL,ZESTRIL) 5 MG tablet Take 1 tablet (5 mg total) by mouth daily. 30 tablet 11  . nitroGLYCERIN (NITROSTAT) 0.4 MG SL tablet Place 1 tablet (0.4 mg total) under the tongue every 5 (five) minutes as needed for chest pain. 25 tablet 5  . omeprazole (PRILOSEC) 40 MG capsule Take 1 capsule (40 mg total) by mouth daily. 90 capsule 3  . risperiDONE (RISPERDAL) 0.5 MG tablet Take 0.5 mg by mouth daily.      . sertraline (ZOLOFT) 100 MG tablet Take 100 mg by mouth daily.    . simvastatin (ZOCOR) 40 MG tablet TAKE 1 TABLET BY MOUTH AT BEDTIME 30 tablet 11   No current facility-administered medications for this visit.      REVIEW OF SYSTEMS: See HPI for pertinent positives and negatives.  Physical Examination Vitals:   10/08/15 1259  BP: (!) 163/91  Pulse: 86  SpO2: 98%  Weight: 190 lb 4.8 oz (86.3 kg)  Height: 5\' 10"  (1.778 m)   Body mass index is 27.31 kg/m.  General: WDWN in NAD Gait: Normal HENT: WNL. He caries a spit cup due to residual dysphagia. Eyes: Pupils equal Pulmonary: normal non-labored breathing, CTAB, without Rales, rhonchi, wheezing Cardiac: Irregular rhythm, no murmur detected  Abdomen: soft, NT, no palpable masses Skin: no rashes, no ulcers, no cellulitis.  VASCULAR EXAM  Carotid Bruits Left Right   Negative Negative   Radial pulses: are 2+ palpable and =  Aorta: is not  palpable   VASCULAR EXAM: Extremities without ischemic changes  without Gangrene; without open wounds; trace pitting edema in both ankles and lower legs with hemosiderin deposits, venous insufficiancy.     LE Pulses LEFT RIGHT       FEMORAL not palpable Not palpable   POPLITEAL not palpable  not palpable   POSTERIOR TIBIAL 1+ palpable  not palpable    DORSALIS PEDIS  ANTERIOR TIBIAL not palpable  not palpable     Musculoskeletal: no muscle wasting or atrophy. Neurologic: A&O X 3; somewhat flat Affect ;  MOTOR FUNCTION: 4/5 Symmetric, CN 2-12 intact except for right facial droop Speech is slightly garbled.     ASSESSMENT:  SAHEJ AMBERSON is a 75 y.o. 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 He also has residual dysphagia, right facial droop, denies known monocular vision loss, denies much difficulty with expressive aphasia.  He is also s/p right external iliac stent and left common iliac stent in 2011. He denies claudication symptoms. 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 He stopped smoking in 2014, uses nicotine vapor product. ABI's are unreliable due to the presence of super-systolic tibial pressure which suggests medial calcification. Waveforms are mostly biphasic. TBI: 0.53 right (70 toe pressure), 0.63 left (83 toe pressure).  Today's carotid Duplex suggests 1-39% stenosis of the right proximal internal carotid artery and  a 40-59% stenosis of the left proximal internal carotid artery. No significant change noted when compared to the previous exam on 09/13/13 and 09/20/14.  PLAN:   Based on today's exam and non-invasive vascular lab results, the patient will follow up in 1 year  with the following  tests: carotid duplex and ABI's. I advised him to notify us if he develops concerns re the circulation in his feet/legs. I discussed in depth with the patient the nature of atherosclerosis, and emphasized the importance of maximal medical management including strict control of blood pressure, blood glucose, and lipid levels, obtaining regular exercise, and cessation of smoking.  The patient is aware that without maximal medical management the underlying atherosclerotic disease process will progress, limiting the benefit of any interventions.  The patient was given information about stroke prevention and what symptoms should prompt the patient to seek immediate medical care.  The patient was given information about PAD including signs, symptoms, treatment, what symptoms should prompt the patient to seek immediate medical care, and risk reduction measures to take. Thank you for allowing [place] to participate in this patient's care.  Clemon Chambers, RN, MSN, FNP-C [facility] Office: [phone]  Clinic MD: Early 10/08/2015 1:00 PM

## 2015-10-08 NOTE — Patient Instructions (Signed)
Stroke Prevention Some medical conditions and behaviors are associated with an increased chance of having a stroke. You may prevent a stroke by making healthy choices and managing medical conditions. HOW CAN I REDUCE MY RISK OF HAVING A STROKE?   Stay physically active. Get at least 30 minutes of activity on most or all days.  Do not smoke. It may also be helpful to avoid exposure to secondhand smoke.  Limit alcohol use. Moderate alcohol use is considered to be:  No more than 2 drinks per day for men.  No more than 1 drink per day for nonpregnant women.  Eat healthy foods. This involves:  Eating 5 or more servings of fruits and vegetables a day.  Making dietary changes that address high blood pressure (hypertension), high cholesterol, diabetes, or obesity.  Manage your cholesterol levels.  Making food choices that are high in fiber and low in saturated fat, trans fat, and cholesterol may control cholesterol levels.  Take any prescribed medicines to control cholesterol as directed by your health care provider.  Manage your diabetes.  Controlling your carbohydrate and sugar intake is recommended to manage diabetes.  Take any prescribed medicines to control diabetes as directed by your health care provider.  Control your hypertension.  Making food choices that are low in salt (sodium), saturated fat, trans fat, and cholesterol is recommended to manage hypertension.  Ask your health care provider if you need treatment to lower your blood pressure. Take any prescribed medicines to control hypertension as directed by your health care provider.  If you are 18-39 years of age, have your blood pressure checked every 3-5 years. If you are 40 years of age or older, have your blood pressure checked every year.  Maintain a healthy weight.  Reducing calorie intake and making food choices that are low in sodium, saturated fat, trans fat, and cholesterol are recommended to manage  weight.  Stop drug abuse.  Avoid taking birth control pills.  Talk to your health care provider about the risks of taking birth control pills if you are over 35 years old, smoke, get migraines, or have ever had a blood clot.  Get evaluated for sleep disorders (sleep apnea).  Talk to your health care provider about getting a sleep evaluation if you snore a lot or have excessive sleepiness.  Take medicines only as directed by your health care provider.  For some people, aspirin or blood thinners (anticoagulants) are helpful in reducing the risk of forming abnormal blood clots that can lead to stroke. If you have the irregular heart rhythm of atrial fibrillation, you should be on a blood thinner unless there is a good reason you cannot take them.  Understand all your medicine instructions.  Make sure that other conditions (such as anemia or atherosclerosis) are addressed. SEEK IMMEDIATE MEDICAL CARE IF:   You have sudden weakness or numbness of the face, arm, or leg, especially on one side of the body.  Your face or eyelid droops to one side.  You have sudden confusion.  You have trouble speaking (aphasia) or understanding.  You have sudden trouble seeing in one or both eyes.  You have sudden trouble walking.  You have dizziness.  You have a loss of balance or coordination.  You have a sudden, severe headache with no known cause.  You have new chest pain or an irregular heartbeat. Any of these symptoms may represent a serious problem that is an emergency. Do not wait to see if the symptoms will   go away. Get medical help at once. Call your local emergency services (911 in [place] Do not drive yourself to the hospital.   This information is not intended to replace advice given to you by your health care provider. Make sure you discuss any questions you have with your health care provider.   Document Released: 03/19/2004 Document Revised: 03/02/2014 Document Reviewed:  08/12/2012 Elsevier Interactive Patient Education 2016 Elsevier Inc[employer]    Peripheral Vascular Disease Peripheral vascular disease (PVD) is a disease of the blood vessels that are not part of your heart and brain. A simple term for PVD is poor circulation. In most cases, PVD narrows the blood vessels that carry blood from your heart to the rest of your body. This can result in a decreased supply of blood to your arms, legs, and internal organs, like your stomach or kidneys. However, it most often affects a person's lower legs and feet. There are two types of PVD.  Organic PVD. This is the more common type. It is caused by damage to the structure of blood vessels.  Functional PVD. This is caused by conditions that make blood vessels contract and tighten (spasm). Without treatment, PVD tends to get worse over time. PVD can also lead to acute ischemic limb. This is when an arm or limb suddenly has trouble getting enough blood. This is a medical emergency. CAUSES Each type of PVD has many different causes. The most common cause of PVD is buildup of a fatty material (plaque) inside of your arteries (atherosclerosis). Small amounts of plaque can break off from the walls of the blood vessels and become lodged in a smaller artery. This blocks blood flow and can cause acute ischemic limb. Other common causes of PVD include:  Blood clots that form inside of blood vessels.  Injuries to blood vessels.  Diseases that cause inflammation of blood vessels or cause blood vessel spasms.  Health behaviors and health history that increase your risk of developing PVD. RISK FACTORS  You may have a greater risk of PVD if you:  Have a family history of PVD.  Have certain medical conditions, including:  High cholesterol.  Diabetes.  High blood pressure (hypertension).  Coronary heart disease.  Past problems with blood clots.  Past injury, such as burns or a broken bone. These may have damaged blood  vessels in your limbs.  Buerger disease. This is caused by inflamed blood vessels in your hands and feet.  Some forms of arthritis.  Rare birth defects that affect the arteries in your legs.  Use tobacco.  Do not get enough exercise.  Are obese.  Are age 50 or older. SIGNS AND SYMPTOMS  PVD may cause many different symptoms. Your symptoms depend on what part of your body is not getting enough blood. Some common signs and symptoms include:  Cramps in your lower legs. This may be a symptom of poor leg circulation (claudication).  Pain and weakness in your legs while you are physically active that goes away when you rest (intermittent claudication).  Leg pain when at rest.  Leg numbness, tingling, or weakness.  Coldness in a leg or foot, especially when compared with the other leg.  Skin or hair changes. These can include:  Hair loss.  Shiny skin.  Pale or bluish skin.  Thick toenails.  Inability to get or maintain an erection (erectile dysfunction). People with PVD are more prone to developing ulcers and sores on their toes, feet, or legs. These may take longer than   normal to heal. DIAGNOSIS Your health care provider may diagnose PVD from your signs and symptoms. The health care provider will also do a physical exam. You may have tests to find out what is causing your PVD and determine its severity. Tests may include:  Blood pressure recordings from your arms and legs and measurements of the strength of your pulses (pulse volume recordings).  Imaging studies using sound waves to take pictures of the blood flow through your blood vessels (Doppler ultrasound).  Injecting a dye into your blood vessels before having imaging studies using:  X-rays (angiogram or arteriogram).  Computer-generated X-rays (CT angiogram).  A powerful electromagnetic field and a computer (magnetic resonance angiogram or MRA). TREATMENT Treatment for PVD depends on the cause of your condition  and the severity of your symptoms. It also depends on your age. Underlying causes need to be treated and controlled. These include long-lasting (chronic) conditions, such as diabetes, high cholesterol, and high blood pressure. You may need to first try making lifestyle changes and taking medicines. Surgery may be needed if these do not work. Lifestyle changes may include:  Quitting smoking.  Exercising regularly.  Following a low-fat, low-cholesterol diet. Medicines may include:  Blood thinners to prevent blood clots.  Medicines to improve blood flow.  Medicines to improve your blood cholesterol levels. Surgical procedures may include:  A procedure that uses an inflated balloon to open a blocked artery and improve blood flow (angioplasty).  A procedure to put in a tube (stent) to keep a blocked artery open (stent implant).  Surgery to reroute blood flow around a blocked artery (peripheral bypass surgery).  Surgery to remove dead tissue from an infected wound on the affected limb.  Amputation. This is surgical removal of the affected limb. This may be necessary in cases of acute ischemic limb that are not improved through medical or surgical treatments. HOME CARE INSTRUCTIONS  Take medicines only as directed by your health care provider.  Do not use any tobacco products, including cigarettes, chewing tobacco, or electronic cigarettes. If you need help quitting, ask your health care provider.  Lose weight if you are overweight, and maintain a healthy weight as directed by your health care provider.  Eat a diet that is low in fat and cholesterol. If you need help, ask your health care provider.  Exercise regularly. Ask your health care provider to suggest some good activities for you.  Use compression stockings or other mechanical devices as directed by your health care provider.  Take good care of your feet.  Wear comfortable shoes that fit well.  Check your feet often for  any cuts or sores. SEEK MEDICAL CARE IF:  You have cramps in your legs while walking.  You have leg pain when you are at rest.  You have coldness in a leg or foot.  Your skin changes.  You have erectile dysfunction.  You have cuts or sores on your feet that are not healing. SEEK IMMEDIATE MEDICAL CARE IF:  Your arm or leg turns cold and blue.  Your arms or legs become red, warm, swollen, painful, or numb.  You have chest pain or trouble breathing.  You suddenly have weakness in your face, arm, or leg.  You become very confused or lose the ability to speak.  You suddenly have a very bad headache or lose your vision.   This information is not intended to replace advice given to you by your health care provider. Make sure you discuss any questions   you have with your health care provider.   Document Released: 03/19/2004 Document Revised: 03/02/2014 Document Reviewed: 07/20/2013 Elsevier Interactive Patient Education 2016 Elsevier Inc.  

## 2015-10-15 ENCOUNTER — Other Ambulatory Visit: Payer: Self-pay | Admitting: *Deleted

## 2015-10-15 ENCOUNTER — Encounter: Payer: Self-pay | Admitting: Nurse Practitioner

## 2015-10-15 DIAGNOSIS — I739 Peripheral vascular disease, unspecified: Secondary | ICD-10-CM

## 2015-10-15 DIAGNOSIS — I6523 Occlusion and stenosis of bilateral carotid arteries: Secondary | ICD-10-CM

## 2015-10-25 ENCOUNTER — Encounter: Payer: Self-pay | Admitting: *Deleted

## 2015-10-25 ENCOUNTER — Other Ambulatory Visit: Payer: Self-pay | Admitting: *Deleted

## 2015-10-25 NOTE — Patient Outreach (Signed)
[facility]) Care Management   10/25/2015  [name] September 28, 1940 [id number]  [name] is an 75 y.o. male  Subjective: Pt is doing very well. He has gained weight and his appetite is good. He is getting adequate nutrtional meals. He continues to smoke. He denies pain today.  Objective:   Review of Systems  Constitutional: Negative.   HENT: Negative.   Eyes: Negative.   Respiratory: Negative.   Cardiovascular: Negative.   Gastrointestinal: Negative.   Genitourinary: Negative.   Musculoskeletal: Negative.   Skin: Negative.   Neurological: Negative.   Endo/Heme/Allergies: Negative.   Psychiatric/Behavioral: Negative.    BP (!) 90/50 (BP Location: Left Arm, Patient Position: Sitting, Cuff Size: Normal)   Pulse 90   Resp 18   Wt 194 lb (88 kg)   SpO2 97%   BMI 27.84 kg/m   Physical Exam  Constitutional: He is oriented to person, place, and time. He appears well-developed and well-nourished.  Cardiovascular:  AFIB  Respiratory: Effort normal and breath sounds normal.  GI: Soft. Bowel sounds are normal.  Musculoskeletal: Normal range of motion.  Neurological: He is alert and oriented to person, place, and time.  Skin: Skin is warm and dry.  Psychiatric: He has a normal mood and affect.    Encounter Medications:   Outpatient Encounter Prescriptions as of 10/25/2015  Medication Sig Note  . apixaban (ELIQUIS) 5 MG TABS tablet Take 5 mg by mouth 2 (two) times daily.   . diazepam (VALIUM) 5 MG tablet Take 7.5 mg by mouth daily. Reported on 08/23/2015 08/23/2015: Only takes 1-2 times a week.  . finasteride (PROSCAR) 5 MG tablet Take 5 mg by mouth daily.    . furosemide (LASIX) 20 MG tablet Take 20 mg by mouth daily.  08/23/2015: Pt takes this bid.  Marland Kitchen HYDROcodone-acetaminophen (NORCO) 7.5-325 MG tablet Take 1 tablet by mouth every 6 (six) hours as needed. for pain   . isosorbide mononitrate (IMDUR) 30 MG 24 hr tablet TAKE 1 TABLET BY MOUTH EVERY DAY   .  KLOR-CON M20 20 MEQ tablet TAKE 1 TABLET DAILY   . lisinopril (PRINIVIL,ZESTRIL) 5 MG tablet Take 1 tablet (5 mg total) by mouth daily. 09/27/2015: Pt reports he is only taking 2.5 mg, however, today his BP is 90/50, and he is high risk for falls. I have instructed him to stop this. I will advise Dr. Brigitte Pulse.  . nitroGLYCERIN (NITROSTAT) 0.4 MG SL tablet Place 1 tablet (0.4 mg total) under the tongue every 5 (five) minutes as needed for chest pain.   Marland Kitchen omeprazole (PRILOSEC) 40 MG capsule Take 1 capsule (40 mg total) by mouth daily.   . risperiDONE (RISPERDAL) 0.5 MG tablet Take 0.5 mg by mouth daily.     . sertraline (ZOLOFT) 100 MG tablet Take 100 mg by mouth daily.   . simvastatin (ZOCOR) 40 MG tablet TAKE 1 TABLET BY MOUTH AT BEDTIME    No facility-administered encounter medications on file as of 10/25/2015.     Functional Status:   In your present state of health, do you have any difficulty performing the following activities: 08/23/2015 03/21/2015  Hearing? N N  Vision? Y N  Difficulty concentrating or making decisions? Y N  Walking or climbing stairs? Y N  Dressing or bathing? Y N  Doing errands, shopping? Tempie Donning  Preparing Food and eating ? N -  Using the Toilet? N -  In the past six months, have you accidently leaked urine? N -  Do you have problems with loss of bowel control? N -  Managing your Medications? N -  Managing your Finances? Y -  Housekeeping or managing your Housekeeping? Y -  Some recent data might be hidden    Fall/Depression Screening:    PHQ 2/9 Scores 08/23/2015  PHQ - 2 Score 3  PHQ- 9 Score 17    Assessment:  Weight has improved and stablized, adequate nutritional intake                         COPD - continues to smoke.  Plan: I am closing pt case today. I have advised him that I will still be available to him in the future if he has any needs.  THN CM Care Plan Problem One   Flowsheet Row Most Recent Value  Care Plan Problem One  Weight loss  Role  Documenting the Problem One  Care Management Benedict for Problem One  Active  THN Long Term Goal (31-90 days)  Pt will maintain and or gain some weight over the next 30 months.  THN Long Term Goal Start Date  08/23/15  THN Long Term Goal Met Date  09/27/15  Interventions for Problem One Long Term Goal  Gave pt additional nutritonal supplement coupons. Encouraged to continue his intake of this.  THN CM Short Term Goal #1 (0-30 days)  Pt will try to eat an additional small meal each day a few times a week over the next month.  THN CM Short Term Goal #1 Start Date  08/23/15  Interventions for Short Term Goal #1  Encouraged going to nutritional site but pt is not interested.    [facility]N CM Care Plan Problem Two   Flowsheet Row Most Recent Value  Care Plan Problem Two  Pain control  Role Documenting the Problem Two  Care Management Coordinator  Care Plan for Problem Two  Active  Interventions for Problem Two Long Term Goal   Discussed possible trial of gabapentin and pt will dicuss with primary on next appt. He does get relief with hydrocodone but perhaps a trial could get him off routine narcotic dosing.  THN Long Term Goal (31-90) days  Pt will report improvoed pain management at the end of 90 days.  THN Long Term Goal Start Date  08/23/15  [facility] CM Short Term Goal #1 (0-30 days)  Pt to participate with PT over the next 30 days.  THN CM Short Term Goal #1 Start Date  08/23/15  [facility] CM Short Term Goal #1 Met Date   09/27/15  Interventions for Short Term Goal #2   Encouraged continued exercise regimen.  THN CM Short Term Goal #2 (0-30 days)  Pt to try topical ointment for imrpoved pain managment over the next 30 days.  THN CM Short Term Goal #2 Start Date  08/23/15  [facility] CM Short Term Goal #2 Met Date  09/27/15     [name] [facility] [place] [phone]

## 2015-10-30 ENCOUNTER — Encounter: Payer: Self-pay | Admitting: Nurse Practitioner

## 2015-10-30 ENCOUNTER — Ambulatory Visit (INDEPENDENT_AMBULATORY_CARE_PROVIDER_SITE_OTHER): Payer: Medicare Other | Admitting: Nurse Practitioner

## 2015-10-30 VITALS — BP 120/80 | HR 88 | Ht 69.0 in | Wt 197.1 lb

## 2015-10-30 DIAGNOSIS — R6251 Failure to thrive (child): Secondary | ICD-10-CM

## 2015-10-30 DIAGNOSIS — I482 Chronic atrial fibrillation, unspecified: Secondary | ICD-10-CM

## 2015-10-30 DIAGNOSIS — I2510 Atherosclerotic heart disease of native coronary artery without angina pectoris: Secondary | ICD-10-CM | POA: Diagnosis not present

## 2015-10-30 DIAGNOSIS — I739 Peripheral vascular disease, unspecified: Secondary | ICD-10-CM

## 2015-10-30 DIAGNOSIS — I779 Disorder of arteries and arterioles, unspecified: Secondary | ICD-10-CM

## 2015-10-30 NOTE — Patient Instructions (Addendum)
We will be checking the following labs today - NONE   Medication Instructions:    Continue with your current medicines.     Testing/Procedures To Be Arranged:  N/A  Follow-Up:   See Dr. Burt Knack in 6 months.     Other Special Instructions:   N/A    If you need a refill on your cardiac medications before your next appointment, please call your pharmacy.   Call the [place] office at [phone] if you have any questions, problems or concerns.

## 2015-10-30 NOTE — Progress Notes (Signed)
CARDIOLOGY OFFICE NOTE  Date:  10/30/2015    [name] Date of Birth: 08/02/1940 Medical Record [id number]  PCP:  Marton Redwood, MD  Cardiologist:  Jerel Shepherd    Chief Complaint  Patient presents with  . Atrial Fibrillation  . Coronary Artery Disease    3 month check - seen for Dr. Burt Knack    History of Present Illness: [name] is a 74 y.o. male who presents today for a 3 month check. Seen for Dr. Burt Knack.   He has chronic atrial fibrillation and CAD. He has a history of stroke and CHADS-Vasc = 6 (age 35, stroke 2, vascular disease 1, HTN 1). He was hospitalized in January with chest pain and ruled out for MI. A nuclear stress test showed primarily fixed defects in the inferolateral and apical walls without major ischemia. This was interpreted as 'low-risk' and ongoing medical therapy was recommended.   Seen back in June by Dr. Burt Knack - was not doing well - significant weight loss. Still smoking.   Comes in today. Here alone. Tried to walk in - needed to be placed in a wheelchair. Frail but says this is the best he has felt in 5 years. Continues to smoke. Has his cup with him that he is spitting in. No chest pain.  Says his breathing is "alright". Labs from June from PCP reviewed. Says he did exercise yesterday and that has made it hard for him to walk today. No longer on Pradaxa - says that "about killed me". Getting Eliquis and sounds like it is not costing him anything. No real issues noted today.  Past Medical History:  Diagnosis Date  . Anxiety   . Atrial fibrillation ([place])   . Bradycardia   . Carotid artery occlusion   . Cataract    bilataeral cateracts removed  . CKD (chronic kidney disease), stage III   . Colon polyps   . Common bile duct stone   . COPD (chronic obstructive pulmonary disease) ([place])   . 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 CVA (cerebral vascular accident) ([place])   . Depression    . DVT (deep venous thrombosis) ([place])   . Dyslipidemia   . Essential hypertension   . Foot pain   . GERD (gastroesophageal reflux disease)   . History of benign prostatic hypertrophy   . History of leukocytosis   . Hypertension   . MI, old   . Neuromuscular disorder (HCC)    neuropathy  . Neuropathy ([place])   . Peripheral vascular disease ([place])   . Renal failure, chronic    stage 111  . Tobacco abuse     Past Surgical History:  Procedure Laterality Date  . ANKLE SURGERY    . BYPASS GRAFT  2009 or  2010   stent  . CHOLECYSTECTOMY     Gall Bladder  . COLONOSCOPY    . intervention of the RCA     using a single BMS     Medications: Current Outpatient Prescriptions  Medication Sig Dispense Refill  . apixaban (ELIQUIS) 5 MG TABS tablet Take 5 mg by mouth 2 (two) times daily.    . diazepam (VALIUM) 5 MG tablet Take 7.5 mg by mouth daily. Reported on 08/23/2015    . finasteride (PROSCAR) 5 MG tablet Take 5 mg by mouth daily.     . furosemide (LASIX) 20 MG tablet Take 20 mg by mouth daily.     Marland Kitchen  HYDROcodone-acetaminophen (NORCO) 7.5-325 MG tablet Take 1 tablet by mouth every 6 (six) hours as needed. for pain  0  . isosorbide mononitrate (IMDUR) 30 MG 24 hr tablet TAKE 1 TABLET BY MOUTH EVERY DAY 30 tablet 11  . KLOR-CON M20 20 MEQ tablet TAKE 1 TABLET DAILY 90 tablet 3  . lisinopril (PRINIVIL,ZESTRIL) 5 MG tablet Take 1 tablet (5 mg total) by mouth daily. 30 tablet 11  . nitroGLYCERIN (NITROSTAT) 0.4 MG SL tablet Place 1 tablet (0.4 mg total) under the tongue every 5 (five) minutes as needed for chest pain. 25 tablet 5  . omeprazole (PRILOSEC) 40 MG capsule Take 1 capsule (40 mg total) by mouth daily. 90 capsule 3  . risperiDONE (RISPERDAL) 0.5 MG tablet Take 0.5 mg by mouth daily.      . sertraline (ZOLOFT) 100 MG tablet Take 100 mg by mouth daily.    . simvastatin (ZOCOR) 40 MG tablet TAKE 1 TABLET BY MOUTH AT BEDTIME 30 tablet 11   No current facility-administered medications  for this visit.     Allergies: Allergies  Allergen Reactions  . Hctz [Hydrochlorothiazide] Anaphylaxis and Other (See Comments)    Throat Swelling  . Amitriptyline Other (See Comments)    Made me go crazy  . Gabapentin Other (See Comments)    Made me go crazy  . Metoprolol Tartrate Other (See Comments)    fatigue  . Paroxetine Other (See Comments)    Made me go crazy  . Spiriva Handihaler [Tiotropium Bromide Monohydrate] Other (See Comments)    Urinary retention    Social History: The patient  reports that he has been smoking Cigarettes and E-cigarettes.  He started smoking about 3 years ago. He has been smoking about 0.50 packs per day. He quit smokeless tobacco use about 3 years ago. He reports that he does not drink alcohol or use drugs.   Family History: The patient's family history includes Cancer in his brother; Heart attack in his brother, father, and mother; Heart disease in his brother and brother; Hypertension in his brother, father, and mother; Stroke in his mother.   Review of Systems: Please see the history of present illness.   Otherwise, the review of systems is positive for none.   All other systems are reviewed and negative.   Physical Exam: VS:  BP 120/80   Pulse 88   Ht 5\' 9"  (1.753 m)   Wt 197 lb 1.9 oz (89.4 kg)   SpO2 95% Comment: at rest  BMI 29.11 kg/m  .  BMI Body mass index is 29.11 kg/m.  Wt Readings from Last 3 Encounters:  10/30/15 197 lb 1.9 oz (89.4 kg)  10/25/15 194 lb (88 kg)  10/08/15 190 lb 4.8 oz (86.3 kg)    General: Reeks of tobacco. Frail. Alert and in no acute distress.  Speech hard to understand.  HEENT: Normal.  Neck: Supple, no JVD, carotid bruits, or masses noted.  Cardiac: Irregular irregular rhythm. Rate is ok. No murmurs, rubs, or gallops. No edema.  Respiratory:  Lungs with decreased breath sounds and with normal work of breathing.  GI: Soft and nontender.  MS: No deformity or atrophy. Gait and ROM intact.  Skin: Warm  and dry. Color is normal.  Neuro:  Strength and sensation are intact and no gross focal deficits noted.  Psych: Alert, appropriate and with normal affect.   LABORATORY DATA:  EKG:  EKG is not ordered today.  Lab Results  Component Value Date   WBC  8.5 03/21/2015   HGB 12.7 (L) 03/21/2015   HCT 38.6 (L) 03/21/2015   PLT 218 03/21/2015   GLUCOSE 108 (H) 03/21/2015   CHOL 74 03/21/2015   TRIG 50 03/21/2015   HDL 29 (L) 03/21/2015   LDLCALC 35 03/21/2015   ALT 23 02/12/2014   AST 29 02/12/2014   NA 144 03/21/2015   K 3.8 03/21/2015   CL 104 03/21/2015   CREATININE 1.55 (H) 03/21/2015   BUN 11 03/21/2015   CO2 29 03/21/2015   TSH 2.40 02/12/2014   INR 1.63 (H) 03/21/2015   HGBA1C 5.9 (H) 03/21/2015    BNP (last 3 results)  Recent Labs  03/21/15 0230  BNP 70.2    ProBNP (last 3 results) No results for input(s): PROBNP in the last 8760 hours.   Other Studies Reviewed Today:  Myoview Scan 03-28-2015:    Study Highlights     Nuclear stress EF: 50%.  There was no ST segment deviation noted during stress.  The left ventricular ejection fraction is mildly decreased (45-54%).  This is a low risk study.  Low risk stress nuclear study with small, severe, fixed apical defect and large, severe, partially reversible inferolateral defect; findings consistent with apical thinning and prior inferolateral infarct and mild peri-infarct ischemia; EF 50 with akinesis of the basal inferolateral wall.   2d Echo 03-22-2015: Study Conclusions  - Left ventricle: The cavity size was normal. Wall thickness was  normal. Systolic function was mildly reduced. The estimated  ejection fraction was in the range of 45% to 50%. Hypokinesis of  the basal-midinferolateral, inferior, and inferoseptal  myocardium. - Aortic valve: There was trivial regurgitation.  ASSESSMENT AND PLAN: 1.  Chronic atrial fibrillation: CHADS-Vasc score is 43 (age 62, stroke 2, coronary disease,  hypertension). Apparently has not tolerated Pradaxa in the past. He remains on Eliquis and apparently not having to pay for it at this time. Would continue with current management with rate control and anticoagulation.   2. CAD, native vessel: no angina at present. Recent nuclear scan reviewed and low-risk.  3. CKD 3: labs from Dr. Brigitte Pulse from June noted BUN 14 and creatinine 1.3 noted.  4. Tobacco abuse: He will not be stopping.      5. Weight loss/failure to thrive: may have been from his Pradaxa - has gained        some weight.   Current medicines are reviewed with the patient today.  The patient does not have concerns regarding medicines other than what has been noted above.  The following changes have been made:  See above.  Labs/ tests ordered today include:   No orders of the defined types were placed in this encounter.    Disposition:   FU with Dr. Burt Knack in 4 months.   Patient is agreeable to this plan and will call if any problems develop in the interim.   Signed: Burtis Junes, RN, ANP-C 10/30/2015 9:23 AM  [facility] [address] [place], [place]  [address] Phone: [phone] Fax: [phone]

## 2015-11-06 ENCOUNTER — Encounter: Payer: Self-pay | Admitting: *Deleted

## 2015-11-13 DIAGNOSIS — Z6829 Body mass index (BMI) 29.0-29.9, adult: Secondary | ICD-10-CM | POA: Diagnosis not present

## 2015-11-13 DIAGNOSIS — R627 Adult failure to thrive: Secondary | ICD-10-CM | POA: Diagnosis not present

## 2015-11-13 DIAGNOSIS — Z7901 Long term (current) use of anticoagulants: Secondary | ICD-10-CM | POA: Diagnosis not present

## 2015-11-13 DIAGNOSIS — R7301 Impaired fasting glucose: Secondary | ICD-10-CM | POA: Diagnosis not present

## 2015-11-13 DIAGNOSIS — E784 Other hyperlipidemia: Secondary | ICD-10-CM | POA: Diagnosis not present

## 2015-11-13 DIAGNOSIS — Z23 Encounter for immunization: Secondary | ICD-10-CM | POA: Diagnosis not present

## 2015-11-13 DIAGNOSIS — I480 Paroxysmal atrial fibrillation: Secondary | ICD-10-CM | POA: Diagnosis not present

## 2015-11-13 DIAGNOSIS — G6289 Other specified polyneuropathies: Secondary | ICD-10-CM | POA: Diagnosis not present

## 2015-11-13 DIAGNOSIS — I10 Essential (primary) hypertension: Secondary | ICD-10-CM | POA: Diagnosis not present

## 2015-11-14 DIAGNOSIS — F332 Major depressive disorder, recurrent severe without psychotic features: Secondary | ICD-10-CM | POA: Diagnosis not present

## 2016-02-21 ENCOUNTER — Emergency Department (HOSPITAL_COMMUNITY)
Admission: EM | Admit: 2016-02-21 | Discharge: 2016-02-21 | Disposition: A | Discharge status: Home / Self Care | Payer: Medicare Other | Attending: Emergency Medicine | Admitting: Emergency Medicine

## 2016-02-21 ENCOUNTER — Emergency Department (HOSPITAL_COMMUNITY): Payer: Medicare Other

## 2016-02-21 ENCOUNTER — Encounter (HOSPITAL_COMMUNITY): Payer: Self-pay | Admitting: *Deleted

## 2016-02-21 DIAGNOSIS — I252 Old myocardial infarction: Secondary | ICD-10-CM | POA: Diagnosis not present

## 2016-02-21 DIAGNOSIS — I129 Hypertensive chronic kidney disease with stage 1 through stage 4 chronic kidney disease, or unspecified chronic kidney disease: Secondary | ICD-10-CM | POA: Diagnosis not present

## 2016-02-21 DIAGNOSIS — I482 Chronic atrial fibrillation: Secondary | ICD-10-CM | POA: Diagnosis not present

## 2016-02-21 DIAGNOSIS — R0789 Other chest pain: Secondary | ICD-10-CM | POA: Diagnosis not present

## 2016-02-21 DIAGNOSIS — J449 Chronic obstructive pulmonary disease, unspecified: Secondary | ICD-10-CM | POA: Insufficient documentation

## 2016-02-21 DIAGNOSIS — R0602 Shortness of breath: Secondary | ICD-10-CM | POA: Diagnosis not present

## 2016-02-21 DIAGNOSIS — Z7901 Long term (current) use of anticoagulants: Secondary | ICD-10-CM | POA: Diagnosis not present

## 2016-02-21 DIAGNOSIS — Z951 Presence of aortocoronary bypass graft: Secondary | ICD-10-CM | POA: Insufficient documentation

## 2016-02-21 DIAGNOSIS — R072 Precordial pain: Secondary | ICD-10-CM | POA: Insufficient documentation

## 2016-02-21 DIAGNOSIS — I2510 Atherosclerotic heart disease of native coronary artery without angina pectoris: Secondary | ICD-10-CM | POA: Diagnosis not present

## 2016-02-21 DIAGNOSIS — Z955 Presence of coronary angioplasty implant and graft: Secondary | ICD-10-CM | POA: Diagnosis not present

## 2016-02-21 DIAGNOSIS — Z8673 Personal history of transient ischemic attack (TIA), and cerebral infarction without residual deficits: Secondary | ICD-10-CM | POA: Insufficient documentation

## 2016-02-21 DIAGNOSIS — N183 Chronic kidney disease, stage 3 (moderate): Secondary | ICD-10-CM | POA: Insufficient documentation

## 2016-02-21 DIAGNOSIS — F17210 Nicotine dependence, cigarettes, uncomplicated: Secondary | ICD-10-CM | POA: Diagnosis not present

## 2016-02-21 DIAGNOSIS — R061 Stridor: Secondary | ICD-10-CM | POA: Diagnosis not present

## 2016-02-21 DIAGNOSIS — R079 Chest pain, unspecified: Secondary | ICD-10-CM | POA: Diagnosis not present

## 2016-02-21 LAB — CBC WITH DIFFERENTIAL/PLATELET
Basophils Absolute: 0 10*3/uL (ref 0.0–0.1)
Basophils Relative: 0 %
Eosinophils Absolute: 0.1 10*3/uL (ref 0.0–0.7)
Eosinophils Relative: 2 %
HCT: 41.8 % (ref 39.0–52.0)
Hemoglobin: 13.7 g/dL (ref 13.0–17.0)
Lymphocytes Relative: 17 %
Lymphs Abs: 1.5 10*3/uL (ref 0.7–4.0)
MCH: 29.3 pg (ref 26.0–34.0)
MCHC: 32.8 g/dL (ref 30.0–36.0)
MCV: 89.5 fL (ref 78.0–100.0)
Monocytes Absolute: 0.7 10*3/uL (ref 0.1–1.0)
Monocytes Relative: 8 %
Neutro Abs: 6.4 10*3/uL (ref 1.7–7.7)
Neutrophils Relative %: 73 %
Platelets: 184 10*3/uL (ref 150–400)
RBC: 4.67 MIL/uL (ref 4.22–5.81)
RDW: 15.1 % (ref 11.5–15.5)
WBC: 8.8 10*3/uL (ref 4.0–10.5)

## 2016-02-21 LAB — COMPREHENSIVE METABOLIC PANEL
ALT: 14 U/L — ABNORMAL LOW (ref 17–63)
AST: 19 U/L (ref 15–41)
Albumin: 3.4 g/dL — ABNORMAL LOW (ref 3.5–5.0)
Alkaline Phosphatase: 55 U/L (ref 38–126)
Anion gap: 9 (ref 5–15)
BUN: 10 mg/dL (ref 6–20)
CO2: 25 mmol/L (ref 22–32)
Calcium: 9 mg/dL (ref 8.9–10.3)
Chloride: 105 mmol/L (ref 101–111)
Creatinine, Ser: 1.65 mg/dL — ABNORMAL HIGH (ref 0.61–1.24)
GFR calc Af Amer: 45 mL/min — ABNORMAL LOW (ref >60)
GFR calc non Af Amer: 38 mL/min — ABNORMAL LOW (ref >60)
Glucose, Bld: 107 mg/dL — ABNORMAL HIGH (ref 65–99)
Potassium: 4.3 mmol/L (ref 3.5–5.1)
Sodium: 139 mmol/L (ref 135–145)
Total Bilirubin: 0.7 mg/dL (ref 0.3–1.2)
Total Protein: 6.9 g/dL (ref 6.5–8.1)

## 2016-02-21 LAB — I-STAT TROPONIN, ED: Troponin i, poc: 0 ng/mL (ref 0.00–0.08)

## 2016-02-21 LAB — BRAIN NATRIURETIC PEPTIDE: B Natriuretic Peptide: 130.8 pg/mL — ABNORMAL HIGH (ref 0.0–100.0)

## 2016-02-21 LAB — LIPASE, BLOOD: Lipase: 29 U/L (ref 11–51)

## 2016-02-21 LAB — PROTIME-INR
INR: 1.65
Prothrombin Time: 19.7 seconds — ABNORMAL HIGH (ref 11.4–15.2)

## 2016-02-21 MED ORDER — ACETAMINOPHEN 500 MG PO TABS: 1000.0000 mg | ORAL_TABLET | Freq: Four times a day (QID) | ORAL | 0 refills | Status: DC | PRN

## 2016-02-21 MED ORDER — ACETAMINOPHEN 500 MG PO TABS: 1000.0000 mg | ORAL_TABLET | Freq: Once | ORAL | Status: DC

## 2016-02-21 NOTE — ED Triage Notes (Signed)
Meds given by EMS 3 nitro Sl and 300 ml NS IV.

## 2016-02-21 NOTE — ED Provider Notes (Signed)
[facility] DEPT Provider Note   CSN: [id number] Arrival date & time: 02/21/16  0908     History   Chief Complaint Chief Complaint  Patient presents with  . Chest Pain    HPI [name] is a 75 y.o. male.  HPI Patient reports chest pain starting Wednesday, 2 days ago. On Wednesday he reports the left side of his chest most of the day. He didn't qualify the pain he just calls it "a pain". He denies associated symptoms. He reports he felt significantly more short of breath than baseline with usual activities. He denies fever cough. On Thursday, the pain was gone. He reports his back again today. It's been constant in nature. He denies radiation or associated symptoms. Patient reports he has been compliant with his L [place]. He reports he has not been taking his blood pressure medications. Upon EMS arrival, patient's blood pressure was reportedly 140s over 70s. He was given 3 nitroglycerin. Patient does not report any improvement in his pain. Patient is an active smoker. Past Medical History:  Diagnosis Date  . Anxiety   . Atrial fibrillation ([place])   . Bradycardia   . Carotid artery occlusion   . Cataract    bilataeral cateracts removed  . CKD (chronic kidney disease), stage III   . Colon polyps   . Common bile duct stone   . COPD (chronic obstructive pulmonary disease) ([place])   . 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 CVA (cerebral vascular accident) ([place])   . Depression   . DVT (deep venous thrombosis) ([place])   . Dyslipidemia   . Essential hypertension   . Foot pain   . GERD (gastroesophageal reflux disease)   . History of benign prostatic hypertrophy   . History of leukocytosis   . Hypertension   . MI, old   . Neuromuscular disorder (H[facility])    neuropathy  . Neuropathy ([place])   . Peripheral vascular disease ([place])   . Renal failure, chronic    stage 111  . Tobacco abuse     Patient Active Problem List   Diagnosis  Date Noted  . Chest pain, rule out acute myocardial infarction   . Chest pain 03/21/2015  . Dysphagia 03/21/2015  . CKD (chronic kidney disease), stage III   . CN (constipation) 11/13/2014  . Dysphagia, pharyngoesophageal phase 11/13/2014  . Coronary artery disease involving native coronary artery of native heart without angina pectoris 07/31/2014  . Chronic atrial fibrillation ([place]) 07/31/2014  . Adenomatous colon polyp 03/01/2014  . Swelling of limb- Right Leg > Left leg 09/13/2013  . Carotid stenosis 09/08/2012  . HLD (hyperlipidemia) 09/03/2008  . LEUKOCYTOSIS 09/03/2008  . Anxiety state 09/03/2008  . TOBACCO ABUSE 09/03/2008  . NEUROPATHY 09/03/2008  . Essential hypertension 09/03/2008  . Coronary atherosclerosis 09/03/2008  . BRADYCARDIA 09/03/2008  . CVA 09/03/2008  . GASTROESOPHAGEAL REFLUX DISEASE 09/03/2008  . BPH (benign prostatic hyperplasia) 09/03/2008    Past Surgical History:  Procedure Laterality Date  . ANKLE SURGERY    . BYPASS GRAFT  2009 or  2010   stent  . CHOLECYSTECTOMY     Gall Bladder  . COLONOSCOPY    . intervention of the RCA     using a single BMS       Home Medications    Prior to Admission medications   Medication Sig Start Date End Date Taking? Authorizing Provider  apixaban (ELIQUIS) 5 MG TABS tablet Take  5 mg by mouth 2 (two) times daily.   Yes Historical Provider, MD  diazepam (VALIUM) 5 MG tablet Take 7.5 mg by mouth daily. Reported on 08/23/2015   Yes Historical Provider, MD  finasteride (PROSCAR) 5 MG tablet Take 5 mg by mouth daily.  08/17/12  Yes Historical Provider, MD  furosemide (LASIX) 20 MG tablet Take 20 mg by mouth daily.  07/11/13  Yes Historical Provider, MD  HYDROcodone-acetaminophen (NORCO) 7.5-325 MG tablet Take 1 tablet by mouth every 6 (six) hours as needed. for pain 03/08/15  Yes Historical Provider, MD  isosorbide mononitrate (IMDUR) 30 MG 24 hr tablet TAKE 1 TABLET BY MOUTH EVERY DAY 05/22/15  Yes Sherren Mocha, MD    KLOR-CON M20 20 MEQ tablet TAKE 1 TABLET DAILY 08/14/15  Yes Sherren Mocha, MD  nitroGLYCERIN (NITROSTAT) 0.4 MG SL tablet Place 1 tablet (0.4 mg total) under the tongue every 5 (five) minutes as needed for chest pain. 06/05/15  Yes Sherren Mocha, MD  omeprazole (PRILOSEC) 40 MG capsule Take 1 capsule (40 mg total) by mouth daily. 03/13/14  Yes Jerene Bears, MD  risperiDONE (RISPERDAL) 0.5 MG tablet Take 0.5 mg by mouth at bedtime.    Yes Historical Provider, MD  sertraline (ZOLOFT) 100 MG tablet Take 100 mg by mouth at bedtime.  03/13/15  Yes Historical Provider, MD  simvastatin (ZOCOR) 40 MG tablet TAKE 1 TABLET BY MOUTH AT BEDTIME 05/22/15  Yes Sherren Mocha, MD  acetaminophen (TYLENOL) 500 MG tablet Take 2 tablets (1,000 mg total) by mouth every 6 (six) hours as needed. 02/21/16   Charlesetta Shanks, MD  lisinopril (PRINIVIL,ZESTRIL) 5 MG tablet Take 1 tablet (5 mg total) by mouth daily. Patient not taking: Reported on 02/21/2016 04/01/15   Burtis Junes, NP    Family History Family History  Problem Relation Age of Onset  . Stroke Mother   . Hypertension Mother   . Heart attack Mother   . Heart attack Brother   . Cancer Brother   . Hypertension Father   . Heart attack Father   . Heart disease Brother     AAA  . Hypertension Brother   . Heart disease Brother   . Colon cancer Neg Hx   . Esophageal cancer Neg Hx   . Rectal cancer Neg Hx   . Stomach cancer Neg Hx     Social History Social History  Substance Use Topics  . Smoking status: Current Every Day Smoker    Packs/day: 0.50    Types: Cigarettes, E-cigarettes    Start date: 07/20/2012    Last attempt to quit: 09/13/2012  . Smokeless tobacco: Former Systems developer    Quit date: 09/13/2012     Comment: currently using e-cigarettes and regular cigarettes.  . Alcohol use No     Allergies   Hctz [hydrochlorothiazide]; Amitriptyline; Gabapentin; Metoprolol tartrate; Paroxetine; and Spiriva handihaler [tiotropium bromide  monohydrate]   Review of Systems Review of Systems 10 Systems reviewed and are negative for acute change except as noted in the HPI.  Physical Exam Updated Vital Signs BP 143/59   Pulse (!) 34   Resp 12   SpO2 98%   Physical Exam  Constitutional: He is oriented to person, place, and time.  Patient is alert and nontoxic. No respiratory distress. Color is good. He seems slightly depressed in demeanor but is not ill in appearance.  HENT:  Head: Normocephalic and atraumatic.  Mucous memories are slightly dry in appearance. He has brownish plaque on his  tongue. This is consistent with smoking. He does not have any posterior airway swelling.  Eyes: Conjunctivae and EOM are normal. Pupils are equal, round, and reactive to light.  Neck: Neck supple.  Cardiovascular:  Heart sounds are soft and distant. Irregularly irregular with frequent ectopy. Radial pulses intact 2+.  Pulmonary/Chest: Effort normal. No respiratory distress. He exhibits tenderness.  Auscultation is grossly normal. Possible faint rails focal in the left mid to lower base. Patient has good air flow throughout, no active wheezing and no rhonchi. Patient does endorse discomfort compression over the left chest wall in the area he indicated is painful. No palpable abnormality.  Abdominal: Soft. He exhibits no distension. There is no tenderness. There is no guarding.  Musculoskeletal: Normal range of motion. He exhibits no edema, tenderness or deformity.  Skin changes of lower extremities consistent with venous stasis with significant hyperpigmentation. It however does not have any edema or calf tenderness.  Neurological: He is alert and oriented to person, place, and time. He exhibits normal muscle tone. Coordination normal.  Skin: Skin is warm and dry.  Psychiatric:  Demeanor is slightly flat.     ED Treatments / Results  Labs (all labs ordered are listed, but only abnormal results are displayed) Labs Reviewed   COMPREHENSIVE METABOLIC PANEL - Abnormal; Notable for the following:       Result Value   Glucose, Bld 107 (*)    Creatinine, Ser 1.65 (*)    Albumin 3.4 (*)    ALT 14 (*)    GFR calc non Af Amer 38 (*)    GFR calc Af Amer 45 (*)    All other components within normal limits  PROTIME-INR - Abnormal; Notable for the following:    Prothrombin Time 19.7 (*)    All other components within normal limits  BRAIN NATRIURETIC PEPTIDE - Abnormal; Notable for the following:    B Natriuretic Peptide 130.8 (*)    All other components within normal limits  LIPASE, BLOOD  CBC WITH DIFFERENTIAL/PLATELET  URINALYSIS, ROUTINE W REFLEX MICROSCOPIC  I-STAT TROPOININ, ED    EKG  EKG Interpretation  Date/Time:  Friday February 21 2016 09:18:07 EST Ventricular Rate:  94 PR Interval:    QRS Duration: 87 QT Interval:  425 QTC Calculation: 411 R Axis:   65 Text Interpretation:  atrial fib Ventricular bigeminy Borderline short PR interval multiple PVC. otherwise similar to previous Confirmed by Johnney Killian, MD, [place] [phone]) on 02/21/2016 9:28:53 AM       Radiology Dg Chest 2 View  Result Date: 02/21/2016 CLINICAL DATA:  Chest pain EXAM: CHEST  2 VIEW COMPARISON:  March 21, 2015 FINDINGS: There is slight left base scarring. Lungs elsewhere are clear. Heart size and pulmonary vascularity are normal. No adenopathy. There is atherosclerotic calcification in the aorta. No bone lesions. IMPRESSION: Slight scarring left lung base. No edema or consolidation. Aortic atherosclerosis. Electronically Signed   By: Lowella Grip III M.D.   On: 02/21/2016 10:09    Procedures Procedures (including critical care time)  Medications Ordered in ED Medications  acetaminophen (TYLENOL) tablet 1,000 mg (not administered)     Initial Impression / Assessment and Plan / ED Course  I have reviewed the triage vital signs and the nursing notes.  Pertinent labs & imaging results that were available during my care  of the patient were reviewed by me and considered in my medical decision making (see chart for details).  Clinical Course      Final Clinical Impressions(s) /  ED Diagnoses   Final diagnoses:  Precordial pain  Patient has had localized left anterior chest pain for approximately 3 days. There are no associated ischemic symptoms and no radiation. This is moderately reproducible on exam. The patient started to feel improved after administration of Tylenol. Patient does have cardiac risk factors. He reports he is compliant with his Eliquis. Last cardiology evaluation suggested ongoing medical management. Main concern of the patient's chest pain is risk factors. The patient is counseled to follow-up with his cardiologist as soon as possible. He will continue his home medications and take Tylenol for pain. He is seen with his daughter. They're counseled on the necessity return immediately should associated symptoms develop or worsening symptoms.  New Prescriptions New Prescriptions   ACETAMINOPHEN (TYLENOL) 500 MG TABLET    Take 2 tablets (1,000 mg total) by mouth every 6 (six) hours as needed.     Charlesetta Shanks, MD 02/21/16 [phone]

## 2016-02-21 NOTE — ED Triage Notes (Signed)
Pt arrived via EMS with a reported CP since WED. CP is mid sternal with out radiation. Pt reports he smokes 11/2 pks a day and he does not take his BP meds.

## 2016-02-25 ENCOUNTER — Other Ambulatory Visit: Payer: Self-pay | Admitting: Cardiovascular Disease

## 2016-04-03 DIAGNOSIS — J449 Chronic obstructive pulmonary disease, unspecified: Secondary | ICD-10-CM | POA: Diagnosis not present

## 2016-04-03 DIAGNOSIS — Z79891 Long term (current) use of opiate analgesic: Secondary | ICD-10-CM | POA: Diagnosis not present

## 2016-04-03 DIAGNOSIS — Z6829 Body mass index (BMI) 29.0-29.9, adult: Secondary | ICD-10-CM | POA: Diagnosis not present

## 2016-04-03 DIAGNOSIS — M79609 Pain in unspecified limb: Secondary | ICD-10-CM | POA: Diagnosis not present

## 2016-04-03 DIAGNOSIS — G629 Polyneuropathy, unspecified: Secondary | ICD-10-CM | POA: Diagnosis not present

## 2016-05-01 ENCOUNTER — Encounter: Payer: Self-pay | Admitting: *Deleted

## 2016-05-08 ENCOUNTER — Encounter: Payer: Self-pay | Admitting: Cardiovascular Disease

## 2016-05-08 ENCOUNTER — Ambulatory Visit (INDEPENDENT_AMBULATORY_CARE_PROVIDER_SITE_OTHER): Payer: Medicare Other | Admitting: Cardiovascular Disease

## 2016-05-08 VITALS — BP 142/86 | HR 68 | Ht 70.0 in | Wt 204.2 lb

## 2016-05-08 DIAGNOSIS — I209 Angina pectoris, unspecified: Secondary | ICD-10-CM

## 2016-05-08 DIAGNOSIS — I482 Chronic atrial fibrillation, unspecified: Secondary | ICD-10-CM

## 2016-05-08 DIAGNOSIS — I25119 Atherosclerotic heart disease of native coronary artery with unspecified angina pectoris: Secondary | ICD-10-CM | POA: Diagnosis not present

## 2016-05-08 NOTE — Patient Instructions (Signed)
Medication Instructions:  Your physician recommends that you continue on your current medications as directed. Please refer to the Current Medication list given to you today.  Labwork: No new orders.   Testing/Procedures: No new orders.   Follow-Up: Your physician recommends that you schedule a follow-up appointment in: 6 MONTHS with Lori Gerhardt NP  Your physician wants you to follow-up in: 1 YEAR with Dr Cooper.  You will receive a reminder letter in the mail two months in advance. If you don't receive a letter, please call our office to schedule the follow-up appointment.   Any Other Special Instructions Will Be Listed Below (If Applicable).     If you need a refill on your cardiac medications before your next appointment, please call your pharmacy.    

## 2016-05-08 NOTE — Progress Notes (Signed)
Cardiology Office Note Date:  05/08/2016   ID:  [name], DOB 06/08/1940, MRN [id number]  PCP:  Marton Redwood, MD  Cardiologist:  Sherren Mocha, MD    Chief Complaint  Patient presents with  . Follow-up     History of Present Illness: [name] is a 76 y.o. male who presents for follow-up evaluation.   He has chronic atrial fibrillation and CAD. He has a history of stroke and CHADS-Vasc = 6 (age 78, stroke 2, vascular disease 1, HTN 1). He was hospitalized last year with chest pain and ruled out for MI. A nuclear stress test showed primarily fixed defects in the inferolateral and apical walls without major ischemia. This was interpreted as 'low-risk' and ongoing medical therapy was recommended.   The patient is here for follow-up evaluation today. He doesn't get around very well and he is in a wheelchair. He continues to smoke cigarettes. He eats a quart of ice cream every 2 days. He takes his medications and reports good compliance with this. He denies chest pain or shortness of breath at rest. No leg swelling, orthopnea, or PND. He only gets out of the house for doctor's appointments and haircuts.   Past Medical History:  Diagnosis Date  . Anxiety   . Atrial fibrillation ([place])   . Bradycardia   . Carotid artery occlusion   . Cataract    bilataeral cateracts removed  . CKD (chronic kidney disease), stage III   . Colon polyps   . Common bile duct stone   . COPD (chronic obstructive pulmonary disease) ([place])   . 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 CVA (cerebral vascular accident) ([place])   . Depression   . DVT (deep venous thrombosis) ([place])   . Dyslipidemia   . Essential hypertension   . Foot pain   . GERD (gastroesophageal reflux disease)   . History of benign prostatic hypertrophy   . History of leukocytosis   . Hypertension   . MI, old   . Neuromuscular disorder (HCC)    neuropathy  . Neuropathy ([place])    . Peripheral vascular disease ([place])   . Renal failure, chronic    stage 111  . Tobacco abuse     Past Surgical History:  Procedure Laterality Date  . ANKLE SURGERY    . BYPASS GRAFT  2009 or  2010   stent  . CHOLECYSTECTOMY     Gall Bladder  . COLONOSCOPY    . intervention of the RCA     using a single BMS    Current Outpatient Prescriptions  Medication Sig Dispense Refill  . acetaminophen (TYLENOL) 500 MG tablet Take 1,000 mg by mouth every 6 (six) hours as needed (pain).    Marland Kitchen apixaban (ELIQUIS) 5 MG TABS tablet Take 5 mg by mouth 2 (two) times daily.    . diazepam (VALIUM) 5 MG tablet Take 7.5 mg by mouth daily. Reported on 08/23/2015    . finasteride (PROSCAR) 5 MG tablet Take 5 mg by mouth daily.     . furosemide (LASIX) 20 MG tablet Take 20 mg by mouth daily.     Marland Kitchen HYDROcodone-acetaminophen (NORCO) 7.5-325 MG tablet Take 1 tablet by mouth every 6 (six) hours as needed. for pain  0  . isosorbide mononitrate (IMDUR) 30 MG 24 hr tablet TAKE 1 TABLET BY MOUTH EVERY DAY 30 tablet 11  . KLOR-CON M20 20 MEQ tablet TAKE 1 TABLET  DAILY 90 tablet 3  . nitroGLYCERIN (NITROSTAT) 0.4 MG SL tablet Place 1 tablet (0.4 mg total) under the tongue every 5 (five) minutes as needed for chest pain. 25 tablet 5  . omeprazole (PRILOSEC) 40 MG capsule Take 1 capsule (40 mg total) by mouth daily. 90 capsule 3  . risperiDONE (RISPERDAL) 0.5 MG tablet Take 0.5 mg by mouth at bedtime.     . sertraline (ZOLOFT) 100 MG tablet Take 100 mg by mouth at bedtime.     . simvastatin (ZOCOR) 40 MG tablet TAKE 1 TABLET BY MOUTH AT BEDTIME 30 tablet 6   No current facility-administered medications for this visit.     Allergies:   Hctz [hydrochlorothiazide]; Amitriptyline; Gabapentin; Metoprolol tartrate; Paroxetine; and Spiriva handihaler [tiotropium bromide monohydrate]   Social History:  The patient  reports that he has been smoking Cigarettes and E-cigarettes.  He started smoking about 3 years ago. He has  been smoking about 0.50 packs per day. He quit smokeless tobacco use about 3 years ago. He reports that he does not drink alcohol or use drugs.   Family History:  The patient's family history includes AAA (abdominal aortic aneurysm) in his brother; CVA in his father; Cancer in his brother; Heart attack in his brother, father, and mother; Heart disease in his brother and brother; Hypertension in his brother, father, and mother; Stroke in his mother.    ROS:  Please see the history of present illness.  Otherwise, review of systems is positive for poor appetite, easy bruising, fatigue, constipation, difficult urinating, balance problems.  All other systems are reviewed and negative.    PHYSICAL EXAM: VS:  BP (!) 142/86   Pulse 68   Ht 5\' 10"  (1.778 m)   Wt 204 lb 3.2 oz (92.6 kg)   BMI 29.30 kg/m  , BMI Body mass index is 29.3 kg/m. GEN: chronically ill-appearing male, in no acute distress  HEENT: normal  Neck: no JVD, no masses. bilateral carotid bruits Cardiac: irregularly irregularwithout murmur or gallop       Respiratory:  clear to auscultation bilaterally, normal work of breathing GI: soft, nontender, nondistended, + BS MS: no deformity or atrophy  Ext: no pretibial edema Skin: warm and dry, no rash Neuro:  Strength and sensation are intact Psych: euthymic mood, full affect  EKG:  EKG is not ordered today.  Recent Labs: 02/21/2016: ALT 14; B Natriuretic Peptide 130.8; BUN 10; Creatinine, Ser 1.65; Hemoglobin 13.7; Platelets 184; Potassium 4.3; Sodium 139   Lipid Panel     Component Value Date/Time   CHOL 74 03/21/2015 0533   TRIG 50 03/21/2015 0533   HDL 29 (L) 03/21/2015 0533   CHOLHDL 2.6 03/21/2015 0533   VLDL 10 03/21/2015 0533   LDLCALC 35 03/21/2015 0533      Wt Readings from Last 3 Encounters:  05/08/16 204 lb 3.2 oz (92.6 kg)  10/30/15 197 lb 1.9 oz (89.4 kg)  10/25/15 194 lb (88 kg)     ASSESSMENT AND PLAN: 1.  Permanent atrial fibrillation: The patient  is anticoagulated with Eliquis. He is tolerating this without bleeding problems. No changes are made in his medicine regimen today. His heart rate is well controlled.  2. Coronary artery disease, native vessel: No angina. Last nuclear scan reviewed without major ischemia. Continue current therapy.  3. Tobacco abuse: Continues to smoke, not going to quit  4. Chronic kidney disease stage III: Reportedly stable  Current medicines are reviewed with the patient today.  The patient  does not have concerns regarding medicines.  Labs/ tests ordered today include:  No orders of the defined types were placed in this encounter.   Disposition:   FU 6 months with Truitt Merle, NP. He continues to decline with very limited functional capacity, poor diet, and ongoing tobacco use.   Deatra James, MD  05/08/2016 6:24 PM    [facility] [place], [place],   [address] Phone: [phone]; Fax: [phone]

## 2016-05-22 IMAGING — RF DG SWALLOWING FUNCTION
1 series · 17 of 24 positions shown · non-contrast
Comparison: 02/19/2014 esophagram.

CLINICAL DATA: 73-year-old male with history of prior strokes.
Dysphagia. Known esophageal stricture. Subsequent encounter.

EXAM:
MODIFIED BARIUM SWALLOW
TECHNIQUE: Different consistencies of barium were administered orally to the
patient by the Speech Pathologist. Imaging of the pharynx was
performed in the lateral projection.
FLUOROSCOPY TIME:  3 min.

[Series 1: run · 31 acquisitions, 17 frames shown]
[im 1/31]
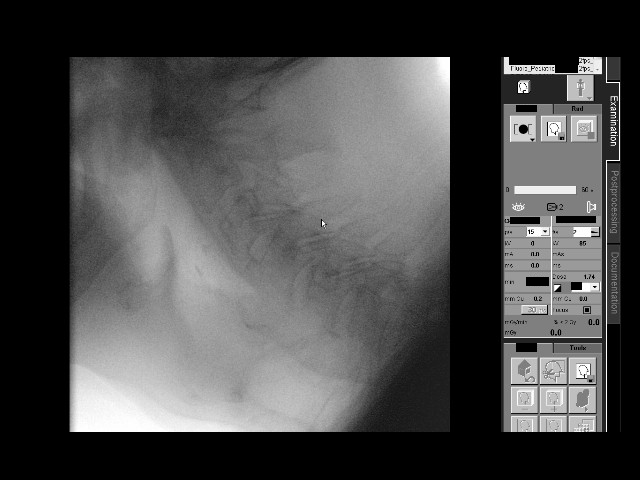
[im 3/31]
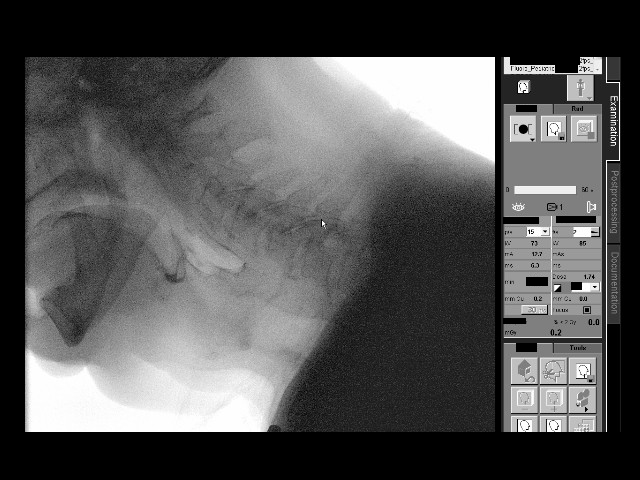
[im 4/31]
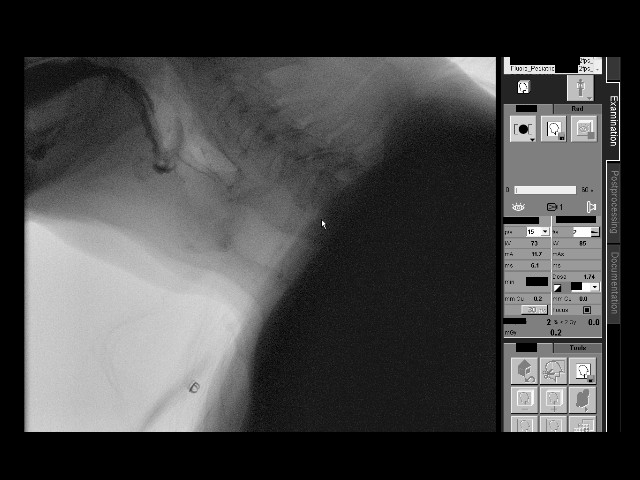
[im 6/31]
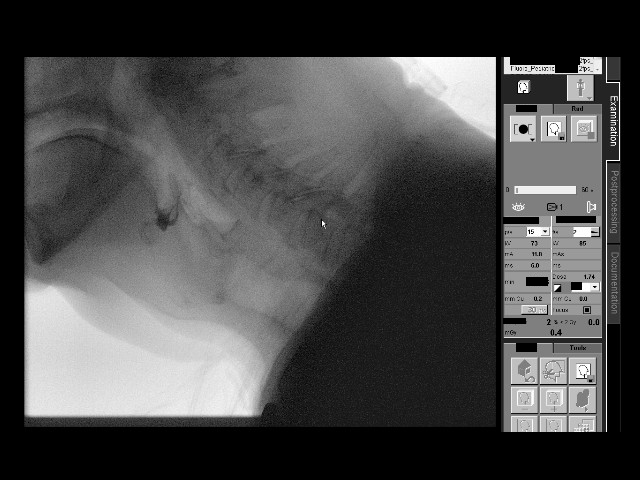
[im 8/31]
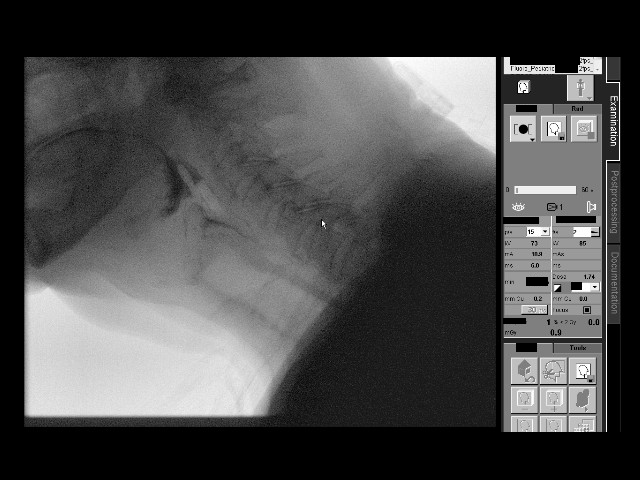
[im 10/31]
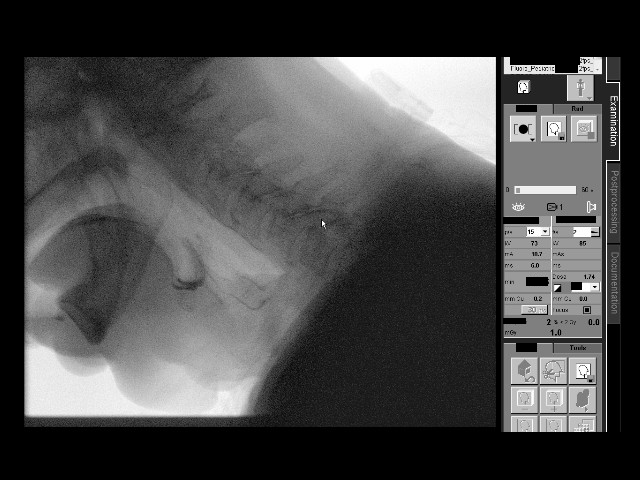
[im 12/31]
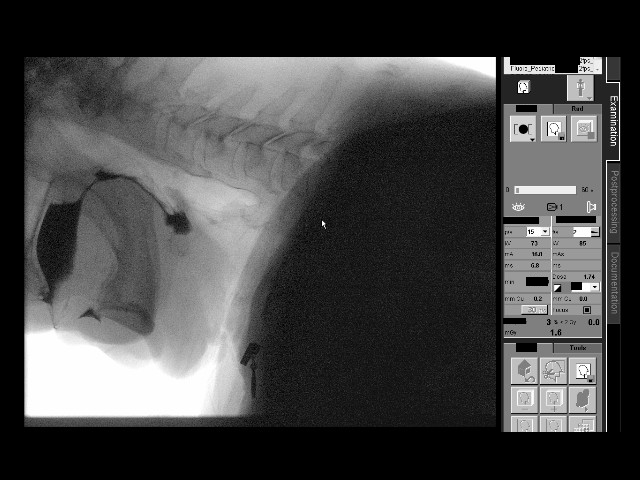
[im 14/31]
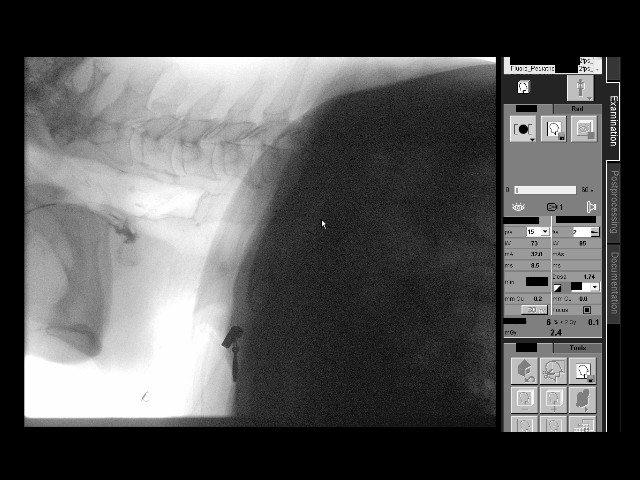
[im 16/31]
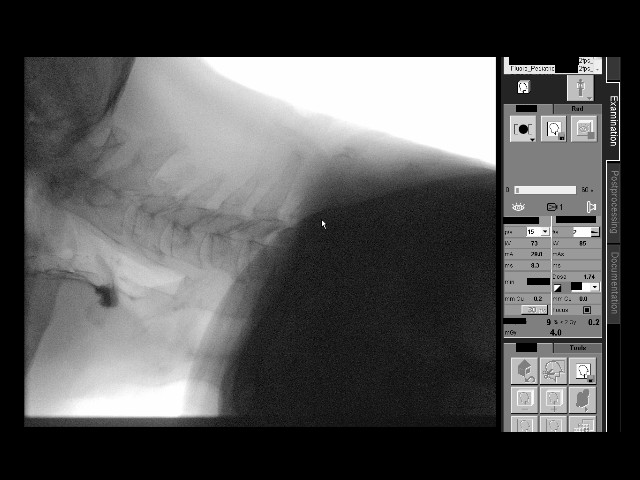
[im 17/31]
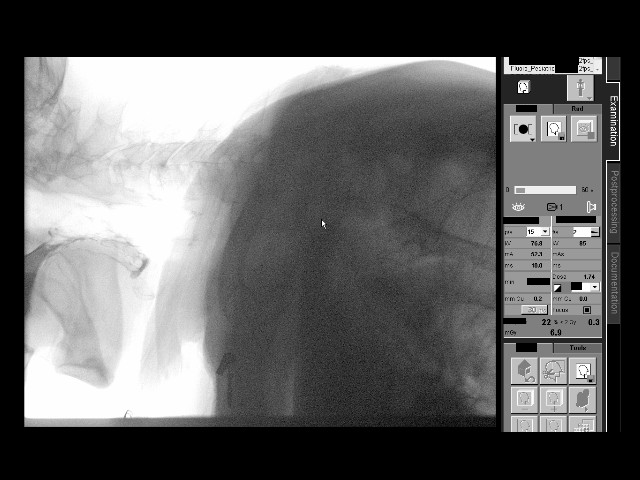
[im 19/31]
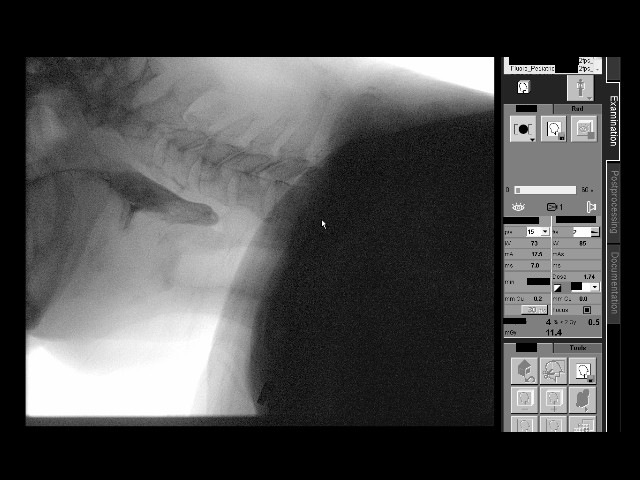
[im 21/31]
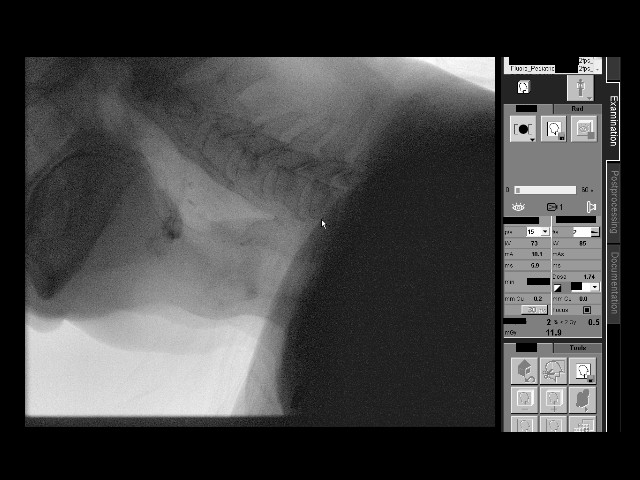
[im 23/31]
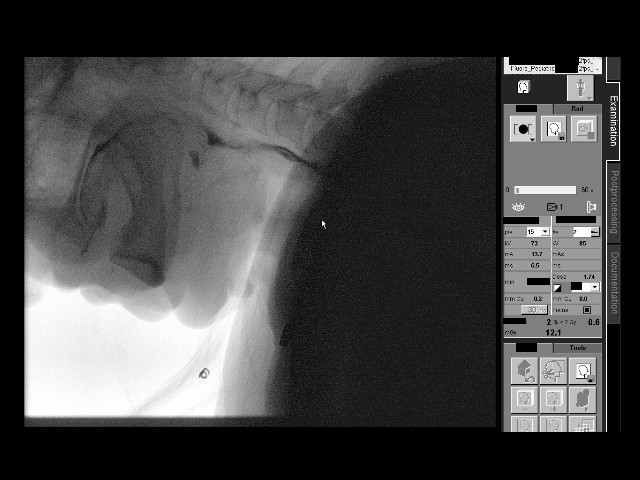
[im 25/31]
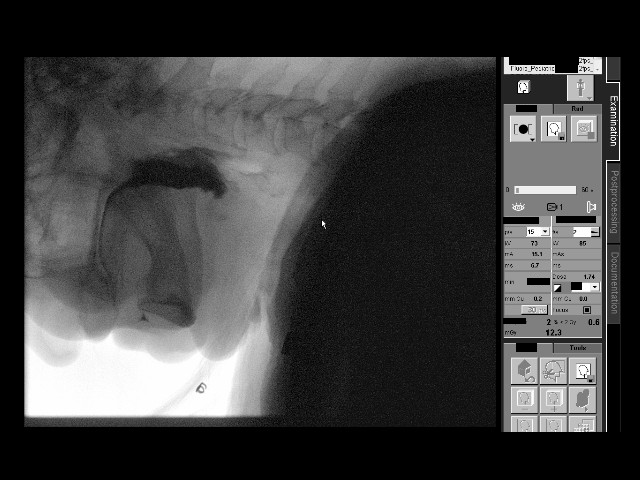
[im 27/31]
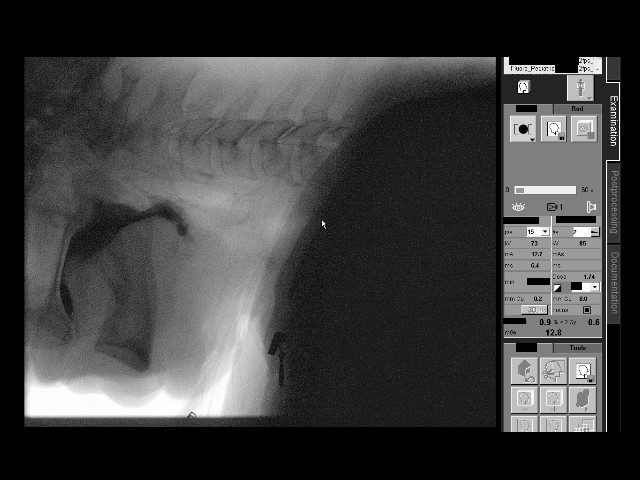
[im 28/31]
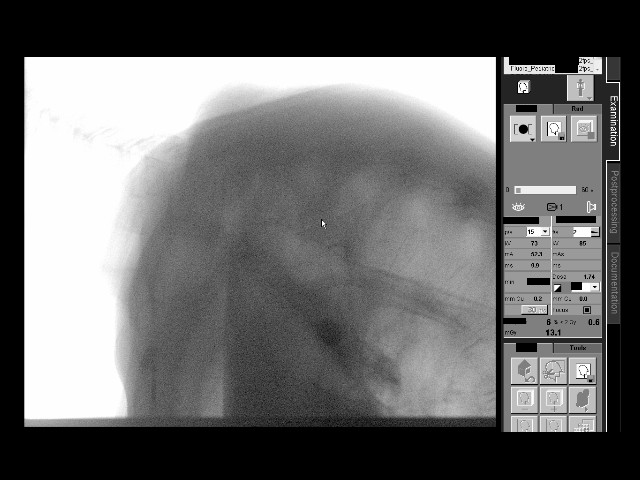
[im 31/31]
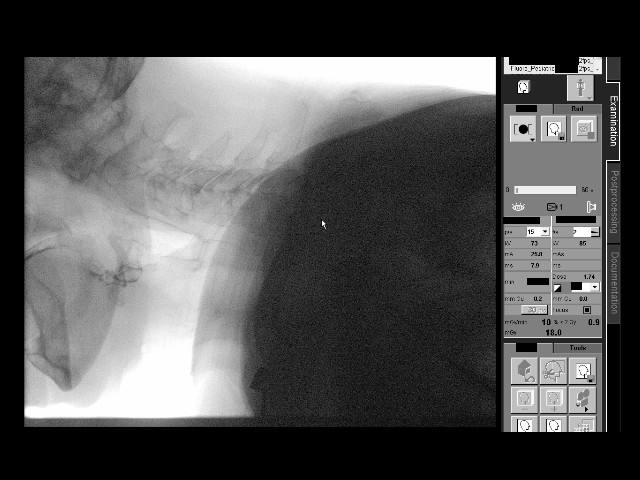

[17 of 24 positions shown; findings below may reference images not displayed]

FINDINGS: Thin liquid- pooling within the vallecular. Transient laryngeal
penetration to level of the cords. This clears with subsequent dry
swelling. Decreased laryngeal penetration with chin tuck position.
Prominent cricopharyngeal bar with impression upon the upper
cervical esophagus.

Weinman?Sector Seis within normal limits

Weinman?Ronlor with cracker- delayed oropharyngeal phase otherwise within
normal limits

Barium tablet - delayed oropharyngeal phase. Patient not able to
small tablet with thin liquid. When mixed with applesauce patient
has no difficulty swallowing tablet
IMPRESSION: Transient laryngeal penetration with ingestion of thin liquid which
responses to chin tuck position.

Delayed oropharyngeal phase and pooling within the vallecular with
all ingested substances.

Patient not able to swallow tablet unless mixed with applesauce.

Please see above for further detail.

Please refer to the Speech Pathologists report for complete details
and recommendations.

## 2016-05-25 ENCOUNTER — Other Ambulatory Visit: Payer: Self-pay | Admitting: Cardiovascular Disease

## 2016-06-30 DIAGNOSIS — F332 Major depressive disorder, recurrent severe without psychotic features: Secondary | ICD-10-CM | POA: Diagnosis not present

## 2016-08-07 ENCOUNTER — Other Ambulatory Visit: Payer: Self-pay | Admitting: Nurse Practitioner

## 2016-08-11 DIAGNOSIS — E784 Other hyperlipidemia: Secondary | ICD-10-CM | POA: Diagnosis not present

## 2016-08-11 DIAGNOSIS — R7301 Impaired fasting glucose: Secondary | ICD-10-CM | POA: Diagnosis not present

## 2016-08-11 DIAGNOSIS — Z125 Encounter for screening for malignant neoplasm of prostate: Secondary | ICD-10-CM | POA: Diagnosis not present

## 2016-08-11 DIAGNOSIS — I10 Essential (primary) hypertension: Secondary | ICD-10-CM | POA: Diagnosis not present

## 2016-08-11 DIAGNOSIS — R8299 Other abnormal findings in urine: Secondary | ICD-10-CM | POA: Diagnosis not present

## 2016-08-18 DIAGNOSIS — Z7189 Other specified counseling: Secondary | ICD-10-CM | POA: Diagnosis not present

## 2016-08-18 DIAGNOSIS — R7301 Impaired fasting glucose: Secondary | ICD-10-CM | POA: Diagnosis not present

## 2016-08-18 DIAGNOSIS — Z1389 Encounter for screening for other disorder: Secondary | ICD-10-CM | POA: Diagnosis not present

## 2016-08-18 DIAGNOSIS — J449 Chronic obstructive pulmonary disease, unspecified: Secondary | ICD-10-CM | POA: Diagnosis not present

## 2016-08-18 DIAGNOSIS — I10 Essential (primary) hypertension: Secondary | ICD-10-CM | POA: Diagnosis not present

## 2016-08-18 DIAGNOSIS — Z0000 Encounter for general adult medical examination without abnormal findings: Secondary | ICD-10-CM | POA: Diagnosis not present

## 2016-08-18 DIAGNOSIS — I480 Paroxysmal atrial fibrillation: Secondary | ICD-10-CM | POA: Diagnosis not present

## 2016-08-18 DIAGNOSIS — I252 Old myocardial infarction: Secondary | ICD-10-CM | POA: Diagnosis not present

## 2016-08-18 DIAGNOSIS — I2510 Atherosclerotic heart disease of native coronary artery without angina pectoris: Secondary | ICD-10-CM | POA: Diagnosis not present

## 2016-08-18 DIAGNOSIS — E784 Other hyperlipidemia: Secondary | ICD-10-CM | POA: Diagnosis not present

## 2016-08-18 DIAGNOSIS — Z6831 Body mass index (BMI) 31.0-31.9, adult: Secondary | ICD-10-CM | POA: Diagnosis not present

## 2016-08-18 DIAGNOSIS — I7389 Other specified peripheral vascular diseases: Secondary | ICD-10-CM | POA: Diagnosis not present

## 2016-08-23 ENCOUNTER — Other Ambulatory Visit: Payer: Self-pay | Admitting: Cardiovascular Disease

## 2016-09-29 ENCOUNTER — Encounter: Payer: Self-pay | Admitting: Family

## 2016-10-08 ENCOUNTER — Ambulatory Visit (HOSPITAL_COMMUNITY)
Admission: RE | Admit: 2016-10-08 | Discharge: 2016-10-08 | Disposition: A | Discharge status: Home / Self Care | Payer: Medicare Other | Source: Ambulatory Visit | Attending: Vascular Surgery | Admitting: Vascular Surgery

## 2016-10-08 ENCOUNTER — Encounter: Payer: Self-pay | Admitting: Family

## 2016-10-08 ENCOUNTER — Ambulatory Visit (INDEPENDENT_AMBULATORY_CARE_PROVIDER_SITE_OTHER): Payer: Medicare Other | Admitting: Family

## 2016-10-08 ENCOUNTER — Ambulatory Visit (INDEPENDENT_AMBULATORY_CARE_PROVIDER_SITE_OTHER)
Admission: RE | Admit: 2016-10-08 | Discharge: 2016-10-08 | Disposition: A | Discharge status: Home / Self Care | Payer: Medicare Other | Source: Ambulatory Visit | Attending: Vascular Surgery | Admitting: Vascular Surgery

## 2016-10-08 VITALS — BP 159/85 | HR 87 | Temp 97.2°F | Resp 18 | Ht 70.0 in | Wt 210.0 lb

## 2016-10-08 DIAGNOSIS — I739 Peripheral vascular disease, unspecified: Secondary | ICD-10-CM | POA: Insufficient documentation

## 2016-10-08 DIAGNOSIS — I779 Disorder of arteries and arterioles, unspecified: Secondary | ICD-10-CM | POA: Diagnosis not present

## 2016-10-08 DIAGNOSIS — Z8673 Personal history of transient ischemic attack (TIA), and cerebral infarction without residual deficits: Secondary | ICD-10-CM

## 2016-10-08 DIAGNOSIS — I6523 Occlusion and stenosis of bilateral carotid arteries: Secondary | ICD-10-CM | POA: Insufficient documentation

## 2016-10-08 DIAGNOSIS — R9439 Abnormal result of other cardiovascular function study: Secondary | ICD-10-CM | POA: Diagnosis not present

## 2016-10-08 DIAGNOSIS — F17200 Nicotine dependence, unspecified, uncomplicated: Secondary | ICD-10-CM

## 2016-10-08 LAB — VAS US CAROTID
LEFT ECA DIAS: 0 cm/s
LEFT VERTEBRAL DIAS: 10 cm/s
Left CCA dist dias: -18 cm/s
Left CCA dist sys: -73 cm/s
Left CCA prox dias: 13 cm/s
Left CCA prox sys: 79 cm/s
Left ICA dist dias: -20 cm/s
Left ICA dist sys: -61 cm/s
Left ICA prox dias: -58 cm/s
Left ICA prox sys: -180 cm/s
RIGHT CCA MID DIAS: 10 cm/s
Right CCA prox sys: -56 cm/s
Right cca dist sys: -73 cm/s

## 2016-10-08 NOTE — Progress Notes (Signed)
[facility] HISTORY AND PHYSICAL   MRN : [id number]  History of Present Illness:   [name] is a 76 y.o. male patient of Dr. Oneida Alar who returns today for carotid artery surveillance and follow up of PAD. 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 He also has residual dysphagia, right facial droop, denies known monocular vision loss, denies much difficulty with expressive aphasia.   His atherosclerotic risk factors remain elevated cholesterol, hypertension, smoking, and coronary artery disease. These are all currently stable and followed by his primary care physician and his cardiolgist.  He denies any new neurologic events. He has also previously had a right external iliac stent and left common iliac stent in 2011. He denies claudication symptoms. 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  Pt states he has had episodes of balance problems for years.   He had ABI's and dopplers at Dr. Honor Junes office on 08/02/13 which are abnormal. Dr. Burt Knack indicated that he would be inclined to manage him medically as he has very limited functional capacity.  He is performing arm and seated leg exercises as discussed about 3x/week.  Pt states he does not walk much due to pain in feet, states he has swelling in ankles and lower legs, denies non healing wounds. He was started on coumadin after his stroke, then to Plavix and ASA, then started Eliquis and stopped the Plavix, per pt, managed by his cardiologist. He takes a daily statin.  Pt Diabetic: No Pt smoker: current smoker, 1+ ppd, started at age 66 years.   Pt meds include: Statin :Yes ASA: No Other anticoagulants/antiplatelets: Eliquis    Current Outpatient Prescriptions  Medication Sig Dispense Refill  . acetaminophen (TYLENOL) 500 MG tablet Take 1,000 mg by mouth every 6 (six) hours as needed (pain).    Marland Kitchen  apixaban (ELIQUIS) 5 MG TABS tablet Take 5 mg by mouth 2 (two) times daily.    . diazepam (VALIUM) 5 MG tablet Take 7.5 mg by mouth daily. Reported on 08/23/2015    . finasteride (PROSCAR) 5 MG tablet Take 5 mg by mouth daily.     . furosemide (LASIX) 20 MG tablet Take 20 mg by mouth daily.     Marland Kitchen HYDROcodone-acetaminophen (NORCO) 7.5-325 MG tablet Take 1 tablet by mouth every 6 (six) hours as needed. for pain  0  . isosorbide mononitrate (IMDUR) 30 MG 24 hr tablet TAKE 1 TABLET BY MOUTH EVERY DAY 30 tablet 11  . KLOR-CON M20 20 MEQ tablet TAKE 1 TABLET DAILY 90 tablet 3  . nitroGLYCERIN (NITROSTAT) 0.4 MG SL tablet Place 1 tablet (0.4 mg total) under the tongue every 5 (five) minutes as needed for chest pain. 25 tablet 5  . omeprazole (PRILOSEC) 40 MG capsule Take 1 capsule (40 mg total) by mouth daily. 90 capsule 3  . risperiDONE (RISPERDAL) 0.5 MG tablet Take 0.5 mg by mouth at bedtime.     . sertraline (ZOLOFT) 100 MG tablet Take 100 mg by mouth at bedtime.     . simvastatin (ZOCOR) 40 MG tablet TAKE 1 TABLET BY MOUTH AT BEDTIME 30 tablet 6   No current facility-administered medications for this visit.     Past Medical History:  Diagnosis Date  . Anxiety   . Atrial fibrillation ([place])   . Bradycardia   . Carotid artery occlusion   . Cataract    bilataeral  cateracts removed  . CKD (chronic kidney disease), stage III   . Colon polyps   . Common bile duct stone   . COPD (chronic obstructive pulmonary disease) ([place])   . 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 CVA (cerebral vascular accident) ([place])   . Depression   . DVT (deep venous thrombosis) ([place])   . Dyslipidemia   . Essential hypertension   . Foot pain   . GERD (gastroesophageal reflux disease)   . History of benign prostatic hypertrophy   . History of leukocytosis   . Hypertension   . MI, old   . Neuromuscular disorder (HCC)    neuropathy  . Neuropathy   . Peripheral  vascular disease ([place])   . Renal failure, chronic    stage 111  . Tobacco abuse     Social History Social History  Substance Use Topics  . Smoking status: Current Every Day Smoker    Packs/day: 1.00    Types: Cigarettes    Start date: 07/20/2012    Last attempt to quit: 09/13/2012  . Smokeless tobacco: Current User    Last attempt to quit: 09/13/2012     Comment: currently using e-cigarettes and regular cigarettes.  . Alcohol use No    Family History Family History  Problem Relation Age of Onset  . Stroke Mother   . Hypertension Mother   . Heart attack Mother   . Heart attack Brother   . Cancer Brother   . Hypertension Father   . Heart attack Father   . CVA Father   . Heart disease Brother   . Hypertension Brother   . AAA (abdominal aortic aneurysm) Brother   . Heart disease Brother   . Colon cancer Neg Hx   . Esophageal cancer Neg Hx   . Rectal cancer Neg Hx   . Stomach cancer Neg Hx     Surgical History Past Surgical History:  Procedure Laterality Date  . ANKLE SURGERY    . BYPASS GRAFT  2009 or  2010   stent  . CHOLECYSTECTOMY     Gall Bladder  . COLONOSCOPY    . intervention of the RCA     using a single BMS    Allergies  Allergen Reactions  . Hctz [Hydrochlorothiazide] Anaphylaxis and Other (See Comments)    Throat Swelling  . Amitriptyline Other (See Comments)    Made me go crazy  . Gabapentin Other (See Comments)    Made me go crazy  . Metoprolol Tartrate Other (See Comments)    fatigue  . Paroxetine Other (See Comments)    Made me go crazy  . Spiriva Handihaler [Tiotropium Bromide Monohydrate] Other (See Comments)    Urinary retention    Current Outpatient Prescriptions  Medication Sig Dispense Refill  . acetaminophen (TYLENOL) 500 MG tablet Take 1,000 mg by mouth every 6 (six) hours as needed (pain).    Marland Kitchen apixaban (ELIQUIS) 5 MG TABS tablet Take 5 mg by mouth 2 (two) times daily.    . diazepam (VALIUM) 5 MG tablet Take 7.5 mg by mouth  daily. Reported on 08/23/2015    . finasteride (PROSCAR) 5 MG tablet Take 5 mg by mouth daily.     . furosemide (LASIX) 20 MG tablet Take 20 mg by mouth daily.     Marland Kitchen HYDROcodone-acetaminophen (NORCO) 7.5-325 MG tablet Take 1 tablet by mouth every 6 (six) hours as needed. for pain  0  .  isosorbide mononitrate (IMDUR) 30 MG 24 hr tablet TAKE 1 TABLET BY MOUTH EVERY DAY 30 tablet 11  . KLOR-CON M20 20 MEQ tablet TAKE 1 TABLET DAILY 90 tablet 3  . nitroGLYCERIN (NITROSTAT) 0.4 MG SL tablet Place 1 tablet (0.4 mg total) under the tongue every 5 (five) minutes as needed for chest pain. 25 tablet 5  . omeprazole (PRILOSEC) 40 MG capsule Take 1 capsule (40 mg total) by mouth daily. 90 capsule 3  . risperiDONE (RISPERDAL) 0.5 MG tablet Take 0.5 mg by mouth at bedtime.     . sertraline (ZOLOFT) 100 MG tablet Take 100 mg by mouth at bedtime.     . simvastatin (ZOCOR) 40 MG tablet TAKE 1 TABLET BY MOUTH AT BEDTIME 30 tablet 6   No current facility-administered medications for this visit.      REVIEW OF SYSTEMS: See HPI for pertinent positives and negatives.  Physical Examination Vitals:   10/08/16 1212 10/08/16 1217  BP: (!) 156/89 (!) 159/85  Pulse: 87   Resp: 18   Temp: (!) 97.2 F (36.2 C)   TempSrc: Oral   SpO2: 95%   Weight: 210 lb (95.3 kg)   Height: 5\' 10"  (1.778 m)    Body mass index is 30.13 kg/m.  General:  WDWN in NAD Gait: Normal HENT: WNL. He caries a spit cup due to residual dysphagia. Eyes: Pupils equal Pulmonary: normal non-labored breathing, CTAB, without Rales, rhonchi, wheezing Cardiac: Irregular rhythm, controlled rate, no murmur detected  Abdomen: soft, NT, no palpable masses Skin: no rashes, no ulcers, no cellulitis.  VASCULAR EXAM  Carotid Bruits Left Right   Negative Negative   Radial pulses: are 2+ palpable and =  Aorta: is not palpable   VASCULAR EXAM: Extremitieswithout ischemic changes  without Gangrene; without  open wounds; 1-2+ pitting edema in both feet, ankles, and lower legs with hemosiderin deposits, venous insufficiancy.     LE Pulses LEFT RIGHT  FEMORAL Faintly palpable Not palpable   POPLITEAL not palpable  not palpable   POSTERIOR TIBIAL 1+ palpable  not palpable    DORSALIS PEDIS  ANTERIOR TIBIAL not palpable  not palpable     Musculoskeletal: no muscle wasting or atrophy.Signficant cervical kyphosis.  Neurologic:A&O X 3; somewhat flat affect, MOTOR FUNCTION: 4/5 Symmetric, CN 2-12 intact except for right facial droop, speech is slightly garbled.      ASSESSMENT:  MACAULAY REICHER is a 76 y.o. 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 He also has residual dysphagia, right facial droop, denies known monocular vision loss, denies much difficulty with expressive aphasia.  He is also s/p right external iliac stent and left common iliac stent in 2011. He denies claudication symptoms. 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 Unfortunately he resumed smoking.    - Chronic Venous insufficiency with dependent edema of feet and ankles, see Patient Instructions.   - Mild and  Moderate bilateral extracranial carotid artery stenosis  - Moderate PAOD, no ulcers, calcified arteries.    DATA  Carotid Duplex (10/08/16): 1-39% stenosis of the right proximal internal carotid artery  40-59% stenosis of the left proximal internal carotid artery. Bilateral vertebral artery flow is antegrade.  Bilateral subclavian artery waveforms are normal.  No significant change noted when compared to the previous exams on 09/13/13, 09/20/14, and 10/08/15.  ABI (Date: 10/08/2016)  R:   ABI: 0.86 (1.01 on 10-08-15),  PT: mono  DP:  mono  TBI:  0.51 (was 0.53)  L:   ABI: 1.23 (was [place]),   PT: bi  DP: absent  TBI: 0.46 (was 0.63)  ABI's are unreliable, do not correspond to waveforms: mono and biphasic.      PLAN:   Based on today's exam and non-invasive vascular lab results, the patient will follow up in 1 year with the following tests: carotid duplex and ABI's. I advised him to notify us if he develops concerns re the circulation in his feet/legs.   I discussed in depth with the patient the nature of atherosclerosis, and emphasized the importance of maximal medical management including strict control of blood pressure, blood glucose, and lipid levels, obtaining regular exercise, and cessation of smoking.  The patient is aware that without maximal medical management the underlying atherosclerotic disease process will progress, limiting the benefit of any interventions.  The patient was given information about stroke prevention and what symptoms should prompt the patient to seek immediate medical care.  The patient was given information about PAD including signs, symptoms, treatment, what symptoms should prompt the patient to seek immediate medical care, and risk reduction measures to take.  Thank you for allowing [place] to participate in this patient's care.  Clemon Chambers, RN, MSN, FNP-C [facility] Office: [phone]  Clinic MD: [facility] 10/08/2016 12:31 PM

## 2016-10-08 NOTE — Patient Instructions (Addendum)
To decrease swelling in your foee and legs: Elevate feet above slightly bent knees, feet above heart, overnight and 3-4 times per day for 20 minutes.   Chronic Venous Insufficiency Chronic venous insufficiency, also called venous stasis, is a condition that prevents blood from being pumped effectively through the veins in your legs. Blood may no longer be pumped effectively from the legs back to the heart. This condition can range from mild to severe. With proper treatment, you should be able to continue with an active life. What are the causes? Chronic venous insufficiency occurs when the vein walls become stretched, weakened, or damaged, or when valves within the vein are damaged. Some common causes of this include:  High blood pressure inside the veins (venous hypertension).  Increased blood pressure in the leg veins from long periods of sitting or standing.  A blood clot that blocks blood flow in a vein (deep vein thrombosis, DVT).  Inflammation of a vein (phlebitis) that causes a blood clot to form.  Tumors in the pelvis that cause blood to back up.  What increases the risk? The following factors may make you more likely to develop this condition:  Having a family history of this condition.  Obesity.  Pregnancy.  Living without enough physical activity or exercise (sedentary lifestyle).  Smoking.  Having a job that requires long periods of standing or sitting in one place.  Being a certain age. Women in their 33s and 29s and men in their 76s are more likely to develop this condition.  What are the signs or symptoms? Symptoms of this condition include:  Veins that are enlarged, bulging, or twisted (varicose veins).  Skin breakdown or ulcers.  Reddened or discolored skin on the front of the leg.  Brown, smooth, tight, and painful skin just above the ankle, usually on the inside of the leg (lipodermatosclerosis).  Swelling.  How is this diagnosed? This condition  may be diagnosed based on:  Your medical history.  A physical exam.  Tests, such as: ? A procedure that creates an image of a blood vessel and nearby organs and provides information about blood flow through the blood vessel (duplex ultrasound). ? A procedure that tests blood flow (plethysmography). ? A procedure to look at the veins using X-ray and dye (venogram).  How is this treated? The goals of treatment are to help you return to an active life and to minimize pain or disability. Treatment depends on the severity of your condition, and it may include:  Wearing compression stockings. These can help relieve symptoms and help prevent your condition from getting worse. However, they do not cure the condition.  Sclerotherapy. This is a procedure involving an injection of a material that "dissolves" damaged veins.  Surgery. This may involve: ? Removing a diseased vein (vein stripping). ? Cutting off blood flow through the vein (laser ablation surgery). ? Repairing a valve.  Follow these instructions at home:  Wear compression stockings as told by your health care provider. These stockings help to prevent blood clots and reduce swelling in your legs.  Take over-the-counter and prescription medicines only as told by your health care provider.  Stay active by exercising, walking, or doing different activities. Ask your health care provider what activities are safe for you and how much exercise you need.  Drink enough fluid to keep your urine clear or pale yellow.  Do not use any products that contain nicotine or tobacco, such as cigarettes and e-cigarettes. If you need help quitting,  ask your health care provider.  Keep all follow-up visits as told by your health care provider. This is important. Contact a health care provider if:  You have redness, swelling, or more pain in the affected area.  You see a red streak or line that extends up or down from the affected area.  You have  skin breakdown or a loss of skin in the affected area, even if the breakdown is small.  You get an injury in the affected area. Get help right away if:  You get an injury and an open wound in the affected area.  You have severe pain that does not get better with medicine.  You have sudden numbness or weakness in the foot or ankle below the affected area, or you have trouble moving your foot or ankle.  You have a fever and you have worse or persistent symptoms.  You have chest pain.  You have shortness of breath. Summary  Chronic venous insufficiency, also called venous stasis, is a condition that prevents blood from being pumped effectively through the veins in your legs.  Chronic venous insufficiency occurs when the vein walls become stretched, weakened, or damaged, or when valves within the vein are damaged.  Treatment for this condition depends on how severe your condition is, and it may involve wearing compression stockings or having a procedure.  Make sure you stay active by exercising, walking, or doing different activities. Ask your health care provider what activities are safe for you and how much exercise you need. This information is not intended to replace advice given to you by your health care provider. Make sure you discuss any questions you have with your health care provider. Document Released: 06/15/2006 Document Revised: 12/30/2015 Document Reviewed: 12/30/2015 Elsevier Interactive Patient Education  2017 [employer].      Stroke Prevention Some medical conditions and behaviors are associated with an increased chance of having a stroke. You may prevent a stroke by making healthy choices and managing medical conditions. How can I reduce my risk of having a stroke?  Stay physically active. Get at least 30 minutes of activity on most or all days.  Do not smoke. It may also be helpful to avoid exposure to secondhand smoke.  Limit alcohol use. Moderate alcohol  use is considered to be: ? No more than 2 drinks per day for men. ? No more than 1 drink per day for nonpregnant women.  Eat healthy foods. This involves: ? Eating 5 or more servings of fruits and vegetables a day. ? Making dietary changes that address high blood pressure (hypertension), high cholesterol, diabetes, or obesity.  Manage your cholesterol levels. ? Making food choices that are high in fiber and low in saturated fat, trans fat, and cholesterol may control cholesterol levels. ? Take any prescribed medicines to control cholesterol as directed by your health care provider.  Manage your diabetes. ? Controlling your carbohydrate and sugar intake is recommended to manage diabetes. ? Take any prescribed medicines to control diabetes as directed by your health care provider.  Control your hypertension. ? Making food choices that are low in salt (sodium), saturated fat, trans fat, and cholesterol is recommended to manage hypertension. ? Ask your health care provider if you need treatment to lower your blood pressure. Take any prescribed medicines to control hypertension as directed by your health care provider. ? If you are 17-76 years of age, have your blood pressure checked every 3-5 years. If you are 40 years of  age or older, have your blood pressure checked every year.  Maintain a healthy weight. ? Reducing calorie intake and making food choices that are low in sodium, saturated fat, trans fat, and cholesterol are recommended to manage weight.  Stop drug abuse.  Avoid taking birth control pills. ? Talk to your health care provider about the risks of taking birth control pills if you are over 8 years old, smoke, get migraines, or have ever had a blood clot.  Get evaluated for sleep disorders (sleep apnea). ? Talk to your health care provider about getting a sleep evaluation if you snore a lot or have excessive sleepiness.  Take medicines only as directed by your health care  provider. ? For some people, aspirin or blood thinners (anticoagulants) are helpful in reducing the risk of forming abnormal blood clots that can lead to stroke. If you have the irregular heart rhythm of atrial fibrillation, you should be on a blood thinner unless there is a good reason you cannot take them. ? Understand all your medicine instructions.  Make sure that other conditions (such as anemia or atherosclerosis) are addressed. Get help right away if:  You have sudden weakness or numbness of the face, arm, or leg, especially on one side of the body.  Your face or eyelid droops to one side.  You have sudden confusion.  You have trouble speaking (aphasia) or understanding.  You have sudden trouble seeing in one or both eyes.  You have sudden trouble walking.  You have dizziness.  You have a loss of balance or coordination.  You have a sudden, severe headache with no known cause.  You have new chest pain or an irregular heartbeat. Any of these symptoms may represent a serious problem that is an emergency. Do not wait to see if the symptoms will go away. Get medical help at once. Call your local emergency services (911 in [place] Do not drive yourself to the hospital. This information is not intended to replace advice given to you by your health care provider. Make sure you discuss any questions you have with your health care provider. Document Released: 03/19/2004 Document Revised: 07/18/2015 Document Reviewed: 08/12/2012 Elsevier Interactive Patient Education  2017 [employer].     Peripheral Vascular Disease Peripheral vascular disease (PVD) is a disease of the blood vessels that are not part of your heart and brain. A simple term for PVD is poor circulation. In most cases, PVD narrows the blood vessels that carry blood from your heart to the rest of your body. This can result in a decreased supply of blood to your arms, legs, and internal organs, like your stomach or  kidneys. However, it most often affects a person's lower legs and feet. There are two types of PVD.  Organic PVD. This is the more common type. It is caused by damage to the structure of blood vessels.  Functional PVD. This is caused by conditions that make blood vessels contract and tighten (spasm).  Without treatment, PVD tends to get worse over time. PVD can also lead to acute ischemic limb. This is when an arm or limb suddenly has trouble getting enough blood. This is a medical emergency. Follow these instructions at home:  Take medicines only as told by your doctor.  Do not use any tobacco products, including cigarettes, chewing tobacco, or electronic cigarettes. If you need help quitting, ask your doctor.  Lose weight if you are overweight, and maintain a healthy weight as told by your doctor.  Eat a diet that is low in fat and cholesterol. If you need help, ask your doctor.  Exercise regularly. Ask your doctor for some good activities for you.  Take good care of your feet. ? Wear comfortable shoes that fit well. ? Check your feet often for any cuts or sores. Contact a doctor if:  You have cramps in your legs while walking.  You have leg pain when you are at rest.  You have coldness in a leg or foot.  Your skin changes.  You are unable to get or have an erection (erectile dysfunction).  You have cuts or sores on your feet that are not healing. Get help right away if:  Your arm or leg turns cold and blue.  Your arms or legs become red, warm, swollen, painful, or numb.  You have chest pain or trouble breathing.  You suddenly have weakness in your face, arm, or leg.  You become very confused or you cannot speak.  You suddenly have a very bad headache.  You suddenly cannot see. This information is not intended to replace advice given to you by your health care provider. Make sure you discuss any questions you have with your health care provider. Document  Released: 05/06/2009 Document Revised: 07/18/2015 Document Reviewed: 07/20/2013 Elsevier Interactive Patient Education  2017 [employer].

## 2016-10-12 NOTE — Addendum Note (Signed)
Addended by: Lianne Cure A on: 10/12/2016 04:02 PM   Modules accepted: Orders

## 2016-11-10 ENCOUNTER — Ambulatory Visit (INDEPENDENT_AMBULATORY_CARE_PROVIDER_SITE_OTHER): Payer: Medicare Other | Admitting: Nurse Practitioner

## 2016-11-10 ENCOUNTER — Encounter: Payer: Self-pay | Admitting: Nurse Practitioner

## 2016-11-10 VITALS — BP 140/80 | HR 72 | Ht 70.0 in | Wt 214.1 lb

## 2016-11-10 DIAGNOSIS — I779 Disorder of arteries and arterioles, unspecified: Secondary | ICD-10-CM

## 2016-11-10 DIAGNOSIS — I6523 Occlusion and stenosis of bilateral carotid arteries: Secondary | ICD-10-CM | POA: Diagnosis not present

## 2016-11-10 DIAGNOSIS — I482 Chronic atrial fibrillation, unspecified: Secondary | ICD-10-CM

## 2016-11-10 DIAGNOSIS — I739 Peripheral vascular disease, unspecified: Secondary | ICD-10-CM

## 2016-11-10 DIAGNOSIS — I2510 Atherosclerotic heart disease of native coronary artery without angina pectoris: Secondary | ICD-10-CM

## 2016-11-10 LAB — BASIC METABOLIC PANEL
BUN/Creatinine Ratio: 6 — ABNORMAL LOW (ref 10–24)
BUN: 9 mg/dL (ref 8–27)
CO2: 25 mmol/L (ref 20–29)
Calcium: 9.1 mg/dL (ref 8.6–10.2)
Chloride: 102 mmol/L (ref 96–106)
Creatinine, Ser: 1.51 mg/dL — ABNORMAL HIGH (ref 0.76–1.27)
GFR calc Af Amer: 50 mL/min/{1.73_m2} — ABNORMAL LOW (ref >59)
GFR calc non Af Amer: 44 mL/min/{1.73_m2} — ABNORMAL LOW (ref >59)
Glucose: 96 mg/dL (ref 65–99)
Potassium: 4.7 mmol/L (ref 3.5–5.2)
Sodium: 141 mmol/L (ref 134–144)

## 2016-11-10 LAB — CBC
Hematocrit: 41.7 % (ref 37.5–51.0)
Hemoglobin: 13.6 g/dL (ref 13.0–17.7)
MCH: 30 pg (ref 26.6–33.0)
MCHC: 32.6 g/dL (ref 31.5–35.7)
MCV: 92 fL (ref 79–97)
Platelets: 222 10*3/uL (ref 150–379)
RBC: 4.53 x10E6/uL (ref 4.14–5.80)
RDW: 15.2 % (ref 12.3–15.4)
WBC: 8.1 10*3/uL (ref 3.4–10.8)

## 2016-11-10 NOTE — Progress Notes (Signed)
CARDIOLOGY OFFICE NOTE  Date:  11/10/2016    [name] Date of Birth: 07/05/1940 Medical Record #[id number]  PCP:  Marton Redwood, MD  Cardiologist:  Burt Knack  Chief Complaint  Patient presents with  . Coronary Artery Disease  . Atrial Fibrillation    6 month check. Seen for Dr. Burt Knack    History of Present Illness: [name] is a 76 y.o. male who presents today for a follow up visit. Seen for Dr. Burt Knack.   He has chronic atrial fibrillation and CAD. He has a history of stroke and his CHADS-Vasc = 6 (age 89, stroke 2, vascular disease 1, HTN 1). He was hospitalized in 2017 with chest pain and ruled out for MI. A nuclear stress test showed primarily fixed defects in the inferolateral and apical walls without major ischemia. This was interpreted as 'low-risk' and ongoing medical therapy was recommended.   Last seen by Dr. Burt Knack back in March. I have not seen since September of 2017. Continues to smoke. Wheelchair bound. Pretty much house bound. Cardiac status felt to be ok.   Comes in today. Here with a friend. He actually is walking. Doing more. Still fairly housebound due to old strokes. Still smoking. Taking his medicines. Has noted more swelling over the past month. Probably does get too much salt. Lots of his neighbors and friends help him with meals. He says he is not short of breath. No chest pain. Other than the swelling he feels like he is doing ok. Tells me he can walk now since his blood thinner was changed to Eliquis. Seeing PCP later this month. No bleeding reported.   Past Medical History:  Diagnosis Date  . Anxiety   . Atrial fibrillation ([place])   . Bradycardia   . Carotid artery occlusion   . Cataract    bilataeral cateracts removed  . CKD (chronic kidney disease), stage III   . Colon polyps   . Common bile duct stone   . COPD (chronic obstructive pulmonary disease) ([place])   . 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 CVA (cerebral vascular accident) ([place])   . Depression   . DVT (deep venous thrombosis) ([place])   . Dyslipidemia   . Essential hypertension   . Foot pain   . GERD (gastroesophageal reflux disease)   . History of benign prostatic hypertrophy   . History of leukocytosis   . Hypertension   . MI, old   . Neuromuscular disorder ([facility])    neuropathy  . Neuropathy   . Peripheral vascular disease ([place])   . Renal failure, chronic    stage 111  . Tobacco abuse     Past Surgical History:  Procedure Laterality Date  . ANKLE SURGERY    . BYPASS GRAFT  2009 or  2010   stent  . CHOLECYSTECTOMY     Gall Bladder  . COLONOSCOPY    . intervention of the RCA     using a single BMS     Medications: Current Meds  Medication Sig  . apixaban (ELIQUIS) 5 MG TABS tablet Take 5 mg by mouth 2 (two) times daily.  . diazepam (VALIUM) 5 MG tablet Take 7.5 mg by mouth daily. Reported on 08/23/2015  . finasteride (PROSCAR) 5 MG tablet Take 5 mg by mouth daily.   . furosemide (LASIX) 20 MG tablet Take 20 mg by mouth 2 (two) times daily.  Marland Kitchen HYDROcodone-acetaminophen ([place])  7.5-325 MG tablet Take 1 tablet by mouth every 6 (six) hours as needed. for pain  . isosorbide mononitrate (IMDUR) 30 MG 24 hr tablet TAKE 1 TABLET BY MOUTH EVERY DAY  . KLOR-CON M20 20 MEQ tablet TAKE 1 TABLET DAILY  . lisinopril (PRINIVIL,ZESTRIL) 10 MG tablet Take 10 mg by mouth daily.  . nitroGLYCERIN (NITROSTAT) 0.4 MG SL tablet Place 1 tablet (0.4 mg total) under the tongue every 5 (five) minutes as needed for chest pain.  Marland Kitchen omeprazole (PRILOSEC) 40 MG capsule Take 1 capsule (40 mg total) by mouth daily.  . risperiDONE (RISPERDAL) 1 MG tablet Take 1 mg by mouth at bedtime.  . sertraline (ZOLOFT) 100 MG tablet Take 100 mg by mouth at bedtime.   . simvastatin (ZOCOR) 40 MG tablet TAKE 1 TABLET BY MOUTH AT BEDTIME     Allergies: Allergies  Allergen Reactions  . Hctz [Hydrochlorothiazide] Anaphylaxis and Other  (See Comments)    Throat Swelling  . Amitriptyline Other (See Comments)    Made me go crazy  . Gabapentin Other (See Comments)    Made me go crazy  . Metoprolol Tartrate Other (See Comments)    fatigue  . Paroxetine Other (See Comments)    Made me go crazy  . Spiriva Handihaler [Tiotropium Bromide Monohydrate] Other (See Comments)    Urinary retention    Social History: The patient  reports that he has been smoking Cigarettes.  He has been smoking about 1.00 pack per day. He uses smokeless tobacco. He reports that he does not drink alcohol or use drugs.   Family History: The patient's family history includes AAA (abdominal aortic aneurysm) in his brother; CVA in his father; Cancer in his brother; Heart attack in his brother, father, and mother; Heart disease in his brother and brother; Hypertension in his brother, father, and mother; Stroke in his mother.   Review of Systems: Please see the history of present illness.   Otherwise, the review of systems is positive for none.   All other systems are reviewed and negative.   Physical Exam: VS:  BP 140/80 (BP Location: Left Arm, Patient Position: Sitting, Cuff Size: Normal)   Pulse 72   Ht 5\' 10"  (1.778 m)   Wt 214 lb 1.9 oz (97.1 kg)   SpO2 97% Comment: at rest  BMI 30.72 kg/m  .  BMI Body mass index is 30.72 kg/m.  Wt Readings from Last 3 Encounters:  11/10/16 214 lb 1.9 oz (97.1 kg)  10/08/16 210 lb (95.3 kg)  05/08/16 204 lb 3.2 oz (92.6 kg)    General: Pleasant. Elderly male. Smells heavily of tobacco. Drools. He is alert and in no acute distress.  Speech is a little hard to understand.  HEENT: Normal. No teeth.  Neck: Supple, no JVD, carotid bruits, or masses noted.  Cardiac: Irregular irregular rhythm. His rate is fine. +1 edema bilaterally.  Respiratory:  Lungs are fairly (surprisingly) clear to auscultation bilaterally with normal work of breathing.  GI: Soft and nontender.  MS: No deformity or atrophy. Gait and ROM  intact. Little unsteady but not in a wheelchair today.  Skin: Warm and dry. Color is normal.  Neuro:  Strength and sensation are intact and no gross focal deficits noted.  Psych: Alert, appropriate and with normal affect.   LABORATORY DATA:  EKG:  EKG is not ordered today.  Lab Results  Component Value Date   WBC 8.8 02/21/2016   HGB 13.7 02/21/2016   HCT 41.8 02/21/2016  PLT 184 02/21/2016   GLUCOSE 107 (H) 02/21/2016   CHOL 74 03/21/2015   TRIG 50 03/21/2015   HDL 29 (L) 03/21/2015   LDLCALC 35 03/21/2015   ALT 14 (L) 02/21/2016   AST 19 02/21/2016   NA 139 02/21/2016   K 4.3 02/21/2016   CL 105 02/21/2016   CREATININE 1.65 (H) 02/21/2016   BUN 10 02/21/2016   CO2 25 02/21/2016   TSH 2.40 02/12/2014   INR 1.65 02/21/2016   HGBA1C 5.9 (H) 03/21/2015        BNP (last 3 results)  Recent Labs  02/21/16 0949  BNP 130.8*    ProBNP (last 3 results) No results for input(s): PROBNP in the last 8760 hours.   Other Studies Reviewed Today:  Myoview Study Highlights 03/2015   Nuclear stress EF: 50%.  There was no ST segment deviation noted during stress.  The left ventricular ejection fraction is mildly decreased (45-54%).  This is a low risk study.   Low risk stress nuclear study with small, severe, fixed apical defect and large, severe, partially reversible inferolateral defect; findings consistent with apical thinning and prior inferolateral infarct and mild peri-infarct ischemia; EF 50 with akinesis of the basal inferolateral wall.      Assessment/Plan:  1.  Permanent atrial fibrillation: The patient is anticoagulated with Eliquis. He is tolerating this without bleeding problems. No changes are made in his medicine regimen today. His heart rate is well controlled. Needs surveillance labs today. Samples given.   2. Coronary artery disease, native vessel: No angina reported. Last nuclear scan reviewed without major ischemia. Continue current therapy.  Would favor conservative management.   3. Tobacco abuse: Continues to smoke, not going to quit.   4. Chronic kidney disease stage III: lab today  5. Prior stroke  6. Swelling - I suspect this is multifactorial - getting too much salt due to his social situation - will increase his Lasix for 5 days. Lab today. Seeing PCP later this month.   Current medicines are reviewed with the patient today.  The patient does not have concerns regarding medicines other than what has been noted above.  The following changes have been made:  See above.  Labs/ tests ordered today include:   No orders of the defined types were placed in this encounter.    Disposition:   FU with Dr. Burt Knack in 3 to 4 months.   Patient is agreeable to this plan and will call if any problems develop in the interim.   SignedTruitt Merle, NP  11/10/2016 11:06 AM  [facility] [address] [place], [place]  [address] Phone: [phone] Fax: [phone]

## 2016-11-10 NOTE — Patient Instructions (Addendum)
We will be checking the following labs today - BMET and CBC  Medication Instructions:    Continue with your current medicines. BUT  I want you to take 40 mg of your Lasix twice a day for 5 days and then cut back to 20 mg twice a day    Testing/Procedures To Be Arranged:  N/A  Follow-Up:   See me in 3 to 4 months.     Other Special Instructions:   N/A    If you need a refill on your cardiac medications before your next appointment, please call your pharmacy.   Call the [place] office at [phone] if you have any questions, problems or concerns.

## 2016-11-10 NOTE — Addendum Note (Signed)
Addended by: Burtis Junes on: 11/10/2016 11:25 AM   Modules accepted: Orders

## 2016-11-17 DIAGNOSIS — Z6831 Body mass index (BMI) 31.0-31.9, adult: Secondary | ICD-10-CM | POA: Diagnosis not present

## 2016-11-17 DIAGNOSIS — Z79891 Long term (current) use of opiate analgesic: Secondary | ICD-10-CM | POA: Diagnosis not present

## 2016-11-17 DIAGNOSIS — M79609 Pain in unspecified limb: Secondary | ICD-10-CM | POA: Diagnosis not present

## 2016-11-20 ENCOUNTER — Other Ambulatory Visit: Payer: Self-pay | Admitting: Cardiovascular Disease

## 2017-02-04 DIAGNOSIS — I480 Paroxysmal atrial fibrillation: Secondary | ICD-10-CM | POA: Diagnosis not present

## 2017-02-04 DIAGNOSIS — I7389 Other specified peripheral vascular diseases: Secondary | ICD-10-CM | POA: Diagnosis not present

## 2017-02-04 DIAGNOSIS — R7301 Impaired fasting glucose: Secondary | ICD-10-CM | POA: Diagnosis not present

## 2017-02-04 DIAGNOSIS — Z7901 Long term (current) use of anticoagulants: Secondary | ICD-10-CM | POA: Diagnosis not present

## 2017-02-04 DIAGNOSIS — Z8673 Personal history of transient ischemic attack (TIA), and cerebral infarction without residual deficits: Secondary | ICD-10-CM | POA: Diagnosis not present

## 2017-02-04 DIAGNOSIS — E7849 Other hyperlipidemia: Secondary | ICD-10-CM | POA: Diagnosis not present

## 2017-02-04 DIAGNOSIS — J449 Chronic obstructive pulmonary disease, unspecified: Secondary | ICD-10-CM | POA: Diagnosis not present

## 2017-02-04 DIAGNOSIS — Z79891 Long term (current) use of opiate analgesic: Secondary | ICD-10-CM | POA: Diagnosis not present

## 2017-02-04 DIAGNOSIS — I10 Essential (primary) hypertension: Secondary | ICD-10-CM | POA: Diagnosis not present

## 2017-02-04 DIAGNOSIS — I2510 Atherosclerotic heart disease of native coronary artery without angina pectoris: Secondary | ICD-10-CM | POA: Diagnosis not present

## 2017-02-04 DIAGNOSIS — Z6831 Body mass index (BMI) 31.0-31.9, adult: Secondary | ICD-10-CM | POA: Diagnosis not present

## 2017-02-04 DIAGNOSIS — Z23 Encounter for immunization: Secondary | ICD-10-CM | POA: Diagnosis not present

## 2017-02-10 ENCOUNTER — Encounter: Payer: Self-pay | Admitting: Cardiovascular Disease

## 2017-02-10 ENCOUNTER — Ambulatory Visit (INDEPENDENT_AMBULATORY_CARE_PROVIDER_SITE_OTHER): Payer: Medicare Other | Admitting: Cardiovascular Disease

## 2017-02-10 VITALS — BP 158/76 | HR 80 | Ht 70.0 in | Wt 215.8 lb

## 2017-02-10 DIAGNOSIS — I779 Disorder of arteries and arterioles, unspecified: Secondary | ICD-10-CM

## 2017-02-10 DIAGNOSIS — I482 Chronic atrial fibrillation: Secondary | ICD-10-CM | POA: Diagnosis not present

## 2017-02-10 DIAGNOSIS — I2510 Atherosclerotic heart disease of native coronary artery without angina pectoris: Secondary | ICD-10-CM | POA: Diagnosis not present

## 2017-02-10 DIAGNOSIS — I4821 Permanent atrial fibrillation: Secondary | ICD-10-CM

## 2017-02-10 NOTE — Progress Notes (Signed)
Cardiology Office Note Date:  02/10/2017   ID:  [name], DOB [birth year], MRN [id number]  PCP:  James Redwood, MD  Cardiologist:  James Mocha, MD    Chief Complaint  Patient presents with  . Chest Pain     History of Present Illness: [name] is a 76 y.o. male who presents for follow-up evaluation.  The patient is followed for permanent atrial fibrillation, coronary artery disease, and peripheral arterial disease.  The patient has been in poor health following previous strokes and is essentially housebound. He gets out of the house for doctor's visits, hair cuts, and to go to the bank once/month.   He's having some chest pain. This occurs at rest and is located in the left chest, localized, non-radiating, and sharp in nature. No real change in pattern.  He has leg swelling at times.  Denies orthopnea or PND.  No heart palpitations.  No lightheadedness or syncope.   Past Medical History:  Diagnosis Date  . Anxiety   . Atrial fibrillation ([place])   . Bradycardia   . Carotid artery occlusion   . Cataract    bilataeral cateracts removed  . CKD (chronic kidney disease), stage III ([place])   . Colon polyps   . Common bile duct stone   . COPD (chronic obstructive pulmonary disease) ([place])   . 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 CVA (cerebral vascular accident) ([place])   . Depression   . DVT (deep venous thrombosis) ([place])   . Dyslipidemia   . Essential hypertension   . Foot pain   . GERD (gastroesophageal reflux disease)   . History of benign prostatic hypertrophy   . History of leukocytosis   . Hypertension   . MI, old   . Neuromuscular disorder (HCC)    neuropathy  . Neuropathy   . Peripheral vascular disease ([place])   . Renal failure, chronic    stage 111  . Tobacco abuse     Past Surgical History:  Procedure Laterality Date  . ANKLE SURGERY    . BYPASS GRAFT  2009 or  2010   stent  . CHOLECYSTECTOMY       Gall Bladder  . COLONOSCOPY    . intervention of the RCA     using a single BMS    Current Outpatient Medications  Medication Sig Dispense Refill  . apixaban (ELIQUIS) 5 MG TABS tablet Take 5 mg by mouth 2 (two) times daily.    . diazepam (VALIUM) 5 MG tablet Take 7.5 mg by mouth daily. Reported on 08/23/2015    . finasteride (PROSCAR) 5 MG tablet Take 5 mg by mouth daily.     . furosemide (LASIX) 20 MG tablet Take 20 mg by mouth 2 (two) times daily.    Marland Kitchen HYDROcodone-acetaminophen (NORCO) 7.5-325 MG tablet Take 1 tablet by mouth every 6 (six) hours as needed. for pain  0  . isosorbide mononitrate (IMDUR) 30 MG 24 hr tablet TAKE 1 TABLET BY MOUTH EVERY DAY 30 tablet 11  . KLOR-CON M20 20 MEQ tablet TAKE 1 TABLET DAILY 90 tablet 3  . lisinopril (PRINIVIL,ZESTRIL) 10 MG tablet Take 10 mg by mouth daily.  11  . nitroGLYCERIN (NITROSTAT) 0.4 MG SL tablet Place 1 tablet (0.4 mg total) under the tongue every 5 (five) minutes as needed for chest pain. 25 tablet 5  . omeprazole (PRILOSEC) 40 MG capsule Take 1 capsule (40 mg total) by  mouth daily. 90 capsule 3  . risperiDONE (RISPERDAL) 1 MG tablet Take 1 mg by mouth at bedtime.  1  . sertraline (ZOLOFT) 100 MG tablet Take 100 mg by mouth at bedtime.     . simvastatin (ZOCOR) 40 MG tablet TAKE 1 TABLET BY MOUTH AT BEDTIME 30 tablet 11   No current facility-administered medications for this visit.     Allergies:   Hctz [hydrochlorothiazide]; Amitriptyline; Gabapentin; Metoprolol tartrate; Paroxetine; and Spiriva handihaler [tiotropium bromide monohydrate]   Social History:  The patient  reports that he has been smoking cigarettes.  He has been smoking about 1.00 pack per day. He uses smokeless tobacco. He reports that he does not drink alcohol or use drugs.   Family History:  The patient's family history includes AAA (abdominal aortic aneurysm) in his brother; CVA in his father; Cancer in his brother; Heart attack in his brother, father, and  mother; Heart disease in his brother and brother; Hypertension in his brother, father, and mother; Stroke in his mother.    ROS:  Please see the history of present illness.  Otherwise, review of systems is positive for leg swelling.  All other systems are reviewed and negative.    PHYSICAL EXAM: VS:  BP (!) 158/76   Pulse 80   Ht 5\' 10"  (1.778 m)   Wt 215 lb 12.8 oz (97.9 kg)   BMI 30.96 kg/m  , BMI Body mass index is 30.96 kg/m. GEN: chronically ill appearing male, in no acute distress  HEENT: normal  Neck: no JVD, no masses. No carotid bruits Cardiac: irregularly irregular without murmur or gallop                Respiratory:  clear to auscultation bilaterally, normal work of breathing GI: soft, nontender, nondistended, + BS MS: no deformity or atrophy  Ext: no pretibial edema Skin: warm and dry, no rash Neuro:  Strength and sensation are intact Psych: euthymic mood, full affect  EKG:  EKG is ordered today. The ekg ordered today shows atrial fibrillation with controlled ventricular rate 80 bpm, otherwise within limits  Recent Labs: 02/21/2016: ALT 14; B Natriuretic Peptide 130.8 11/10/2016: BUN 9; Creatinine, Ser 1.51; Hemoglobin 13.6; Platelets 222; Potassium 4.7; Sodium 141   Lipid Panel     Component Value Date/Time   CHOL 74 03/21/2015 0533   TRIG 50 03/21/2015 0533   HDL 29 (L) 03/21/2015 0533   CHOLHDL 2.6 03/21/2015 0533   VLDL 10 03/21/2015 0533   LDLCALC 35 03/21/2015 0533      Wt Readings from Last 3 Encounters:  02/10/17 215 lb 12.8 oz (97.9 kg)  11/10/16 214 lb 1.9 oz (97.1 kg)  10/08/16 210 lb (95.3 kg)     Cardiac Studies Reviewed: Echo 03-22-2015: Study Conclusions  - Left ventricle: The cavity size was normal. Wall thickness was   normal. Systolic function was mildly reduced. The estimated   ejection fraction was in the range of 45% to 50%. Hypokinesis of   the basal-midinferolateral, inferior, and inferoseptal   myocardium. - Aortic valve:  There was trivial regurgitation.  Myocardial Perfusion Study: Study Highlights    Nuclear stress EF: 50%.  There was no ST segment deviation noted during stress.  The left ventricular ejection fraction is mildly decreased (45-54%).  This is a low risk study.   Low risk stress nuclear study with small, severe, fixed apical defect and large, severe, partially reversible inferolateral defect; findings consistent with apical thinning and prior inferolateral infarct and  mild peri-infarct ischemia; EF 50 with akinesis of the basal inferolateral wall.   ASSESSMENT AND PLAN: 1.  Coronary artery disease, native vessel, with angina: The patient will continue on isosorbide.  He primarily has atypical chest pain and underwent evaluation for the same symptoms in 2017 with no ischemia demonstrated on his nuclear study.  2.  Permanent atrial fibrillation: Anticoagulated with apixaban.  No bleeding problems reported.  Continue same therapy.  Last echo study from 2017 reviewed with normal LV systolic function.  3.  Hypertension: Continue lisinopril 10 mg daily.  4.  Hyperlipidemia: Treated with simvastatin.  Followed by Dr. Brigitte Pulse.  Overall Mr Yoakum remains extremely limited. I don't know of any specific medical interventions to help him. Will continue to follow him every 6 months.   Current medicines are reviewed with the patient today.  The patient does not have concerns regarding medicines.  Labs/ tests ordered today include:   Orders Placed This Encounter  Procedures  . EKG 12-Lead   Disposition:   FU 6 months with Truitt Merle, NP, one year with me  Signed, James Mocha, MD  02/10/2017 6:25 PM    [address], [place], [place]  [address] Phone: [phone]; Fax: [phone]

## 2017-02-10 NOTE — Patient Instructions (Signed)
Medication Instructions:  Your provider recommends that you continue on your current medications as directed. Please refer to the Current Medication list given to you today.    Labwork: None  Testing/Procedures: None  Follow-Up: Your provider wants you to follow-up in: 6 months with Truitt Merle. You will receive a reminder letter in the mail two months in advance. If you don't receive a letter, please call our office to schedule the follow-up appointment.    Your provider wants you to follow-up in: 1 year with Dr. Burt Knack. You will receive a reminder letter in the mail two months in advance. If you don't receive a letter, please call our office to schedule the follow-up appointment.    Any Other Special Instructions Will Be Listed Below (If Applicable).     If you need a refill on your cardiac medications before your next appointment, please call your pharmacy.

## 2017-03-02 ENCOUNTER — Ambulatory Visit: Payer: Medicare Other | Admitting: Nurse Practitioner

## 2017-04-07 DIAGNOSIS — F332 Major depressive disorder, recurrent severe without psychotic features: Secondary | ICD-10-CM | POA: Diagnosis not present

## 2017-05-03 ENCOUNTER — Other Ambulatory Visit: Payer: Self-pay | Admitting: Cardiovascular Disease

## 2017-05-03 MED ORDER — SIMVASTATIN 40 MG PO TABS: 40.0000 mg | ORAL_TABLET | Freq: Every day | ORAL | 2 refills | Status: DC

## 2017-05-06 DIAGNOSIS — M79609 Pain in unspecified limb: Secondary | ICD-10-CM | POA: Diagnosis not present

## 2017-05-06 DIAGNOSIS — Z79891 Long term (current) use of opiate analgesic: Secondary | ICD-10-CM | POA: Diagnosis not present

## 2017-05-06 DIAGNOSIS — Z6831 Body mass index (BMI) 31.0-31.9, adult: Secondary | ICD-10-CM | POA: Diagnosis not present

## 2017-06-11 IMAGING — CR DG CHEST 1V PORT
1 series · 1 of 1 positions shown · non-contrast
Comparison: 12/06/2010

CLINICAL DATA: Chest pain tonight.

EXAM:
PORTABLE CHEST 1 VIEW

[AP]
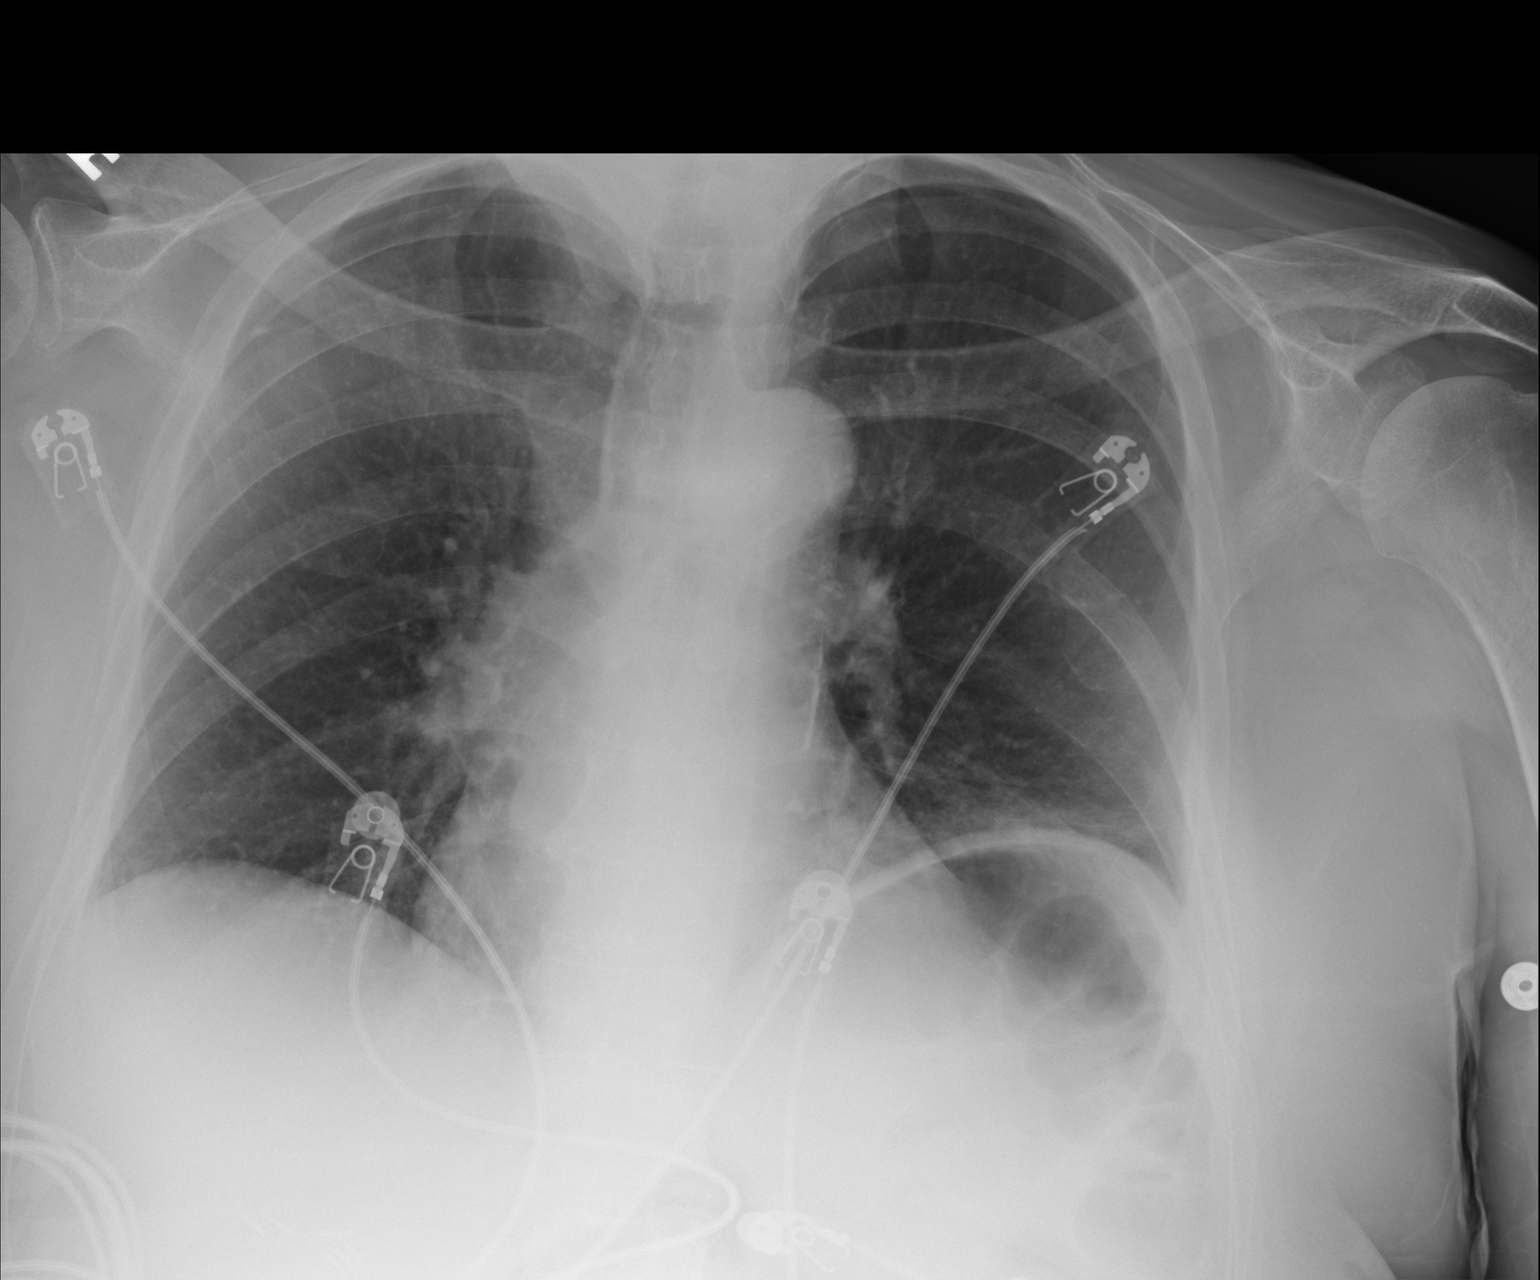

[1 of 1 positions shown; findings below may reference images not displayed]

FINDINGS: Shallow inspiration with elevation of the left hemidiaphragm.
Atelectasis or infiltration demonstrated in the left lung base.
Heart size and pulmonary vascularity are normal. No blunting of
costophrenic angles. No pneumothorax. Calcification of the aorta.
IMPRESSION: Shallow inspiration with elevation of left hemidiaphragm.
Atelectasis or infiltration in the left lung base.

## 2017-06-13 ENCOUNTER — Other Ambulatory Visit: Payer: Self-pay | Admitting: Cardiovascular Disease

## 2017-08-05 ENCOUNTER — Other Ambulatory Visit: Payer: Self-pay

## 2017-08-05 MED ORDER — SIMVASTATIN 40 MG PO TABS: 40.0000 mg | ORAL_TABLET | Freq: Every day | ORAL | 1 refills | Status: DC

## 2017-08-16 ENCOUNTER — Ambulatory Visit: Payer: Medicare Other | Admitting: Nurse Practitioner

## 2017-09-06 DIAGNOSIS — I480 Paroxysmal atrial fibrillation: Secondary | ICD-10-CM | POA: Diagnosis not present

## 2017-09-06 DIAGNOSIS — Z1389 Encounter for screening for other disorder: Secondary | ICD-10-CM | POA: Diagnosis not present

## 2017-09-06 DIAGNOSIS — R7301 Impaired fasting glucose: Secondary | ICD-10-CM | POA: Diagnosis not present

## 2017-09-06 DIAGNOSIS — I739 Peripheral vascular disease, unspecified: Secondary | ICD-10-CM | POA: Diagnosis not present

## 2017-09-06 DIAGNOSIS — I2510 Atherosclerotic heart disease of native coronary artery without angina pectoris: Secondary | ICD-10-CM | POA: Diagnosis not present

## 2017-09-06 DIAGNOSIS — J449 Chronic obstructive pulmonary disease, unspecified: Secondary | ICD-10-CM | POA: Diagnosis not present

## 2017-09-06 DIAGNOSIS — E7849 Other hyperlipidemia: Secondary | ICD-10-CM | POA: Diagnosis not present

## 2017-09-06 DIAGNOSIS — Z7901 Long term (current) use of anticoagulants: Secondary | ICD-10-CM | POA: Diagnosis not present

## 2017-09-06 DIAGNOSIS — I10 Essential (primary) hypertension: Secondary | ICD-10-CM | POA: Diagnosis not present

## 2017-09-06 DIAGNOSIS — G6289 Other specified polyneuropathies: Secondary | ICD-10-CM | POA: Diagnosis not present

## 2017-09-06 DIAGNOSIS — Z0000 Encounter for general adult medical examination without abnormal findings: Secondary | ICD-10-CM | POA: Diagnosis not present

## 2017-09-06 DIAGNOSIS — Z125 Encounter for screening for malignant neoplasm of prostate: Secondary | ICD-10-CM | POA: Diagnosis not present

## 2017-09-06 DIAGNOSIS — Z8673 Personal history of transient ischemic attack (TIA), and cerebral infarction without residual deficits: Secondary | ICD-10-CM | POA: Diagnosis not present

## 2017-09-07 ENCOUNTER — Other Ambulatory Visit: Payer: Self-pay

## 2017-09-07 NOTE — Patient Outreach (Signed)
[facility]) Care Management  09/07/2017  [name] 1940/11/26 [id number]   Telephone Screen  Referral Date: 09/07/17 Referral Source: MD office Referral Reason: " COPD, chronic pain, care management/SW for resource assistance" Insurance: Medicare   Outreach attempt # 1 to patient. Spoke with patient. RN CM had difficulty understanding patient via phone and patient not a very good historian.   Social: He voices that he resides in his home alone. He states that his daughter lives in [place] and not able to assist him much. He has a 'friend" that comes over almost everyday to help him out. He reports that he requires minimal assistance with ADLS/IADLs. He no longer drives. He reports that his friend normally take him to appts. However, he also voiced that he sometimes misses MD appts due to not having transportation. Patient reports multiple falls in the home-at least 5 within the past few months. Last fall was last week. He denies any injuries. He voices he is using a cane to assist with ambulation.    Conditions: Per chart review, patient has PMH of A-fib, CAD, PAD, stroke, HTN and HLD. Patient does not seem very knowledgeable regarding his conditions and how to manage them. He states that he is having more SOB at rest and with exertion. He reports he normally just "wait it out" and hopes that it "passes over" as he does not have meds or know any symptom management measures to help.   Medications: Patient voices he is taking about nine meds. He states that meds "cost a lot" and it "gets hard sometimes."  Patient is unable to name meds that are costly. He also admits that he is getting more forgetful and forgets to take meds at times.    Appointments: Patient saw PCP on yesterday. He is also followed by cardiologist-Dr. Copper and Dr. Lin Landsman   Consent: [facility] services reviewed and discussed. Patient gave verbal consent for services.    Plan: RN CM will send  [facility] SW referral for possible transportation assistance. RN CM will send [place] referral for possible med assistance and med mgmt.  RN CM will send [facility] community nurse referral for further in home eval/assessment of care needs and assistance with managing chronic conditions and symptom management.    Enzo Montgomery, RN,BSN,CCM [place] [facility] Telephonic Care Management Coordinator Direct Phone: [phone] Toll Free: [phone] Fax: [phone]

## 2017-09-09 ENCOUNTER — Other Ambulatory Visit: Payer: Self-pay

## 2017-09-09 DIAGNOSIS — F332 Major depressive disorder, recurrent severe without psychotic features: Secondary | ICD-10-CM | POA: Diagnosis not present

## 2017-09-09 NOTE — Patient Outreach (Signed)
[facility]) Care Management  09/09/2017  [name] 10/22/1940 [id number]  Initial outreach to [name] regarding social work referral for transportation.  [name] reported that he has family, friends, and neighbors that get him to medical appointments.  He reported only missing one MD appointment due to lack of transportation and declined resources for this.   [name] requested that BSW speak with his neighbor, [name].  [name] reported that she and other neighbors work together to provide support for [name].  She confirmed that transportation does not seem to be a barrier for him.  Per [name], he could benefit from Meals on Wheels especially if there is a time that family, friends, or neighbors are not available.  [name] also felt that he could benefit from additional assistance with light housekeeping.   BSW educated her about [employer] through [place], Disability and [employer].  BSW spoke with [name] again and also educated him about these services.  [name] declined more information about this and said it was not something he needs.  [name] agreed that Meals on Wheels would be helpful and gave BSW permission to submit a referral.  BSW informed [name] that there is a wait list for these services but that a referral would be submitted. BSW submitted referral to Meals [employer] Wheels and it was confirmed that the wait list is approximately four months long.  BSW is closing case at this time due to no other social work needs being identified.   [name], BSW Social Worker [phone]

## 2017-09-10 ENCOUNTER — Other Ambulatory Visit: Payer: Self-pay

## 2017-09-10 NOTE — Patient Outreach (Addendum)
[facility]) Care Management  [place]   09/10/2017  SILVER PARKEY 1940-10-06 [id number]  77 year old male referred to [place] [facility] my doctor's office.  [place] services requested for medication management-- forgets to take his medications often.  PMHx includes, but not limited to, hypertension, coronary atherosclerosis, CVA, carotid stenosis, atrial fibrillation, GERD, dysphagia, neuropathy, BPH, CKD Stage III and hyperlipidemia.  Successful outreach attempt to Mr. Wecker.  HIPAA identifiers verified.   Subjective: Mr. Worlds reports that he is having trouble affording his Amitiza and Eliquis.  He states that he currently pays $45/mo for each.  He states that his friends help get him to doctor's appointment and pick up his prescriptions and he admits that he sometimes forgets to take his medications.  He states that he has a pill box, but doesn't use it because it is too much trouble.  He reports that he is not interested in compliance packaging to help him remember when to take his medications.  He states that he does not want to change pharmacies.   Objective:  No recent labs in EPIC  Current Medications: Current Outpatient Medications  Medication Sig Dispense Refill  . apixaban (ELIQUIS) 5 MG TABS tablet Take 5 mg by mouth 2 (two) times daily.    . diazepam (VALIUM) 5 MG tablet Take 5 mg by mouth every 6 (six) hours as needed for anxiety. Reported on 08/23/2015    . finasteride (PROSCAR) 5 MG tablet Take 5 mg by mouth daily.     . furosemide (LASIX) 20 MG tablet Take 20 mg by mouth 2 (two) times daily.    Marland Kitchen HYDROcodone-acetaminophen (NORCO) 7.5-325 MG tablet Take 1 tablet by mouth every 6 (six) hours as needed. for pain  0  . isosorbide mononitrate (IMDUR) 30 MG 24 hr tablet Take 1 tablet (30 mg total) by mouth daily. 90 tablet 2  . KLOR-CON M20 20 MEQ tablet TAKE 1 TABLET DAILY 90 tablet 3  . lubiprostone (AMITIZA) 8 MCG capsule Take 8 mcg by  mouth 2 (two) times daily with a meal.    . nitroGLYCERIN (NITROSTAT) 0.4 MG SL tablet Place 1 tablet (0.4 mg total) under the tongue every 5 (five) minutes as needed for chest pain. 25 tablet 5  . omeprazole (PRILOSEC) 40 MG capsule Take 1 capsule (40 mg total) by mouth daily. 90 capsule 3  . risperiDONE (RISPERDAL) 1 MG tablet Take 1 mg by mouth at bedtime.  1  . sertraline (ZOLOFT) 100 MG tablet Take 100 mg by mouth at bedtime.     . simvastatin (ZOCOR) 40 MG tablet Take 1 tablet (40 mg total) by mouth at bedtime. 90 tablet 1  . lisinopril (PRINIVIL,ZESTRIL) 10 MG tablet Take 10 mg by mouth daily.  11   No current facility-administered medications for this visit.    Medication Assistance: Per financial discussion, Mr. Rossitto does not qualify for Extra Help LIS.  [facility] reports his out of pocket  (OOP) is $757.94.  He will have his friend pick up his patient printout from [facility] this weekend.  Based on this expenditure, patient should be eligible for patient assistance for Eliquis from [employer].  Patient may also be eligible for Amitiza assistance from [employer] based on his income.  Mr. Grismer requested that I call his daughter, [name], to get a document to submit as his proof of income.  I have left her a HIPAA compliant voice message requesting a return call.  Medication Issues: Patient reports that has has self discontinued his lisinopril about a year ago because his blood pressures "were good."  He states that his PCP is not aware.   Plan: Route note to PCP, Dr. Brigitte Pulse to inform him that patient has discontinued his lisinopril.  Route note to CPhT,  Etter Sjogren to begin  patient assistance application process for Eliquis from [employer] and [place] from [place].    Joetta Manners, PharmD Clinical Pharmacist [place] [phone]

## 2017-09-13 ENCOUNTER — Ambulatory Visit: Payer: Self-pay

## 2017-09-14 ENCOUNTER — Other Ambulatory Visit: Payer: Self-pay

## 2017-09-14 ENCOUNTER — Other Ambulatory Visit: Payer: Self-pay | Admitting: Pharmacy Technician

## 2017-09-14 NOTE — Patient Outreach (Signed)
[facility]) Care Management  09/14/2017  [name] 24-Mar-1940 [id number]   Received [name] and [name] patient assistance referral from [place] for Amitiza and Eliquis. Prepared patient portion to be mailed and faxed provider portions to Dr. Burt Knack (Eliquis) and Dr. Brigitte Pulse (Amitiza).  Will follow up with patient in 7-10 business days to confirm applications have been received.  [name] [place], [place] [facility] [phone]

## 2017-09-14 NOTE — Patient Outreach (Signed)
[facility]) Care Management  09/14/2017  [name] 1940-05-03 [id number]  Incoming call received from Mr. Blank daughter, [name].  HIPAA identifiers verified.  [name] had requested that I speak with his daughter, [name] about helping to obtain necessary forms for his patient assistance applications for Eliquis and Amitiza.  Daughter states that she can get the needed forms and requests that we contact her in the future.  She states her father is easily confused by the information we are giving him or requesting from him.  Plan: Will forward note to CPhT, Etter Sjogren.  Joetta Manners, PharmD Clinical Pharmacist [place] [phone]

## 2017-09-17 ENCOUNTER — Other Ambulatory Visit: Payer: Self-pay

## 2017-09-17 NOTE — Patient Outreach (Signed)
[facility]) Care Management  09/17/2017  [name] March 05, 1940 [id number]   Referral received from telephonic care coordinator on 09/08/17 for evaluation assessment of care needs and management of chronic conditions.  Per chart, 77 year old with history of (not all inclusive): COPD, CKD, atrial fibrillation, CVA, tobacco abuse, HTN.  RNCM called to schedule home visit.  Client voicing no specific complaints at this time.  Plan: home visit scheduled for next week.  Thea Silversmith, RN, MSN, [place] Coordinator Cell: [phone]

## 2017-09-21 ENCOUNTER — Other Ambulatory Visit: Payer: Self-pay

## 2017-09-21 NOTE — Patient Outreach (Signed)
[place] [place] [facility]) Care Management   09/21/2017  [name] 12/23/1940 [id number]  [name] is an 77 y.o. male  Subjective: client states, "I want everything to go just like it is going".  Objective:  BP 138/80   Pulse 69   Resp 20   SpO2 94%  Review of Systems  Respiratory:       Wheeze noted but cleared with deep breathing, decreased breath sounds.  Cardiovascular:       S1S2 noted, irregular, lower extremity edema.  Neurological:       Client reports neuropathy in his feet.    Physical Exam skin warm dry, lower leg edema noted.  Encounter Medications:   Outpatient Encounter Medications as of 09/21/2017  Medication Sig  . apixaban (ELIQUIS) 5 MG TABS tablet Take 5 mg by mouth 2 (two) times daily.  . diazepam (VALIUM) 5 MG tablet Take 5 mg by mouth every 6 (six) hours as needed for anxiety. Reported on 08/23/2015  . finasteride (PROSCAR) 5 MG tablet Take 5 mg by mouth daily.   . furosemide (LASIX) 20 MG tablet Take 20 mg by mouth 2 (two) times daily.  Marland Kitchen HYDROcodone-acetaminophen (NORCO) 7.5-325 MG tablet Take 1 tablet by mouth every 6 (six) hours as needed. for pain  . isosorbide mononitrate (IMDUR) 30 MG 24 hr tablet Take 1 tablet (30 mg total) by mouth daily.  Marland Kitchen KLOR-CON M20 20 MEQ tablet TAKE 1 TABLET DAILY  . lisinopril (PRINIVIL,ZESTRIL) 10 MG tablet Take 10 mg by mouth daily.  Marland Kitchen lubiprostone (AMITIZA) 8 MCG capsule Take 8 mcg by mouth 2 (two) times daily with a meal.  . nitroGLYCERIN (NITROSTAT) 0.4 MG SL tablet Place 1 tablet (0.4 mg total) under the tongue every 5 (five) minutes as needed for chest pain.  Marland Kitchen omeprazole (PRILOSEC) 40 MG capsule Take 1 capsule (40 mg total) by mouth daily.  . risperiDONE (RISPERDAL) 1 MG tablet Take 1 mg by mouth at bedtime.  . sertraline (ZOLOFT) 100 MG tablet Take 100 mg by mouth at bedtime.   . simvastatin (ZOCOR) 40 MG tablet Take 1 tablet (40 mg total) by mouth at bedtime.   No facility-administered  encounter medications on file as of 09/21/2017.    Client states he is not taking lisinopril.  Functional Status:   In your present state of health, do you have any difficulty performing the following activities: 09/21/2017  Hearing? Y  Vision? N  Difficulty concentrating or making decisions? Y  Walking or climbing stairs? Y  Dressing or bathing? Y  Doing errands, shopping? Y  Preparing Food and eating ? Y  Using the Toilet? N  In the past six months, have you accidently leaked urine? N  Do you have problems with loss of bowel control? N  Managing your Medications? Y  Managing your Finances? Y  Comment daugher helps manage his finances  Housekeeping or managing your Housekeeping? (No Data)  Comment neighbors help client clean the home  Some recent data might be hidden    Fall/Depression Screening:    Fall Risk  09/07/2017 08/23/2015  Falls in the past year? Yes Yes  Number falls in past yr: 2 or more 1  Injury with Fall? No Yes  Risk Factor Category  High Fall Risk High Fall Risk  Risk for fall due to : History of fall(s);Impaired vision;Impaired balance/gait;Impaired mobility;Medication side effect History of fall(s);Impaired balance/gait;Impaired mobility;Medication side effect  Follow up Falls evaluation completed Falls evaluation completed;Education provided;Falls prevention discussed  PHQ 2/9 Scores 09/21/2017 09/07/2017 08/23/2015  PHQ - 2 Score 1 0 3  PHQ- 9 Score - - 17    Assessment:  Referral received from telephonic care coordinator on 09/08/17 for evaluation assessment of care needs and management of chronic conditions.  Per chart, 77 year old with history of (not all inclusive): COPD, CKD, atrial fibrillation, CVA, speech slurred, tobacco abuse, HTN.  RNCM completed home visit. Client reports he lives alone. He states he has a daughter that lives in [place]. He reports that she helps him out and helps him manage his finances. [name] states that his neighbors  take him to his appointments and spends about 60/month in transportation.  Client reports neuropathy pain. He also reports history of falls. He states his balance is off. RNCM suggested home health physical therapy. Client declined stating he did not have money to pay. Client states he has had physical therapy in the past.   History of chronic pain. Client reports neuropathy in his feet. He reports the pain is about the same and is probably not going to get any better. RNCM reinforced that primary care has been adjusting his medication. RNCM also reinforced that pain clinic is an option. [name] declined stating, "I don't need no more doctor appointments".  Medications reviewed: client states he is not taking Lisinopril and has not had this medication for the past two years. Also not listed on primary care referral sheet as a medication the client takes.  RNCM discussed disease management and client declines disease management on COPD, atrial fibrillation, declines smoking cessation. Client is declining nursing care management services. RNCM will no longer be involved in case.  Plan: update discipline currently involved-pharmacy.   Thea Silversmith, RN, MSN, [place] Coordinator Cell: [phone]

## 2017-10-05 ENCOUNTER — Other Ambulatory Visit: Payer: Self-pay

## 2017-10-05 NOTE — Patient Outreach (Signed)
[facility]) Care Management  10/05/2017  [name] September 29, 1940 [id number]  Successful outreach attempt to [name].  HIPAA identifiers verified.   Mr. Don states that he is unsure if he has received the patient assistance applications in the mail.  He states his friend will come Thursday to get his mail and he will have him look for the application then.  [place] volunteered to stop by his house and see if I could find the application in his junk mail pile.  Patient declined and said he will call me Thursday if he can't find it.  Patient took down my phone number to call me if needed.   Plan: Outreach attempt Thursday afternoon to see if he found the application.  Joetta Manners, PharmD Clinical Pharmacist [place] [phone]

## 2017-10-06 ENCOUNTER — Other Ambulatory Visit: Payer: Self-pay

## 2017-10-06 NOTE — Patient Outreach (Signed)
[facility]) Care Management  10/06/2017  JERRED ZAREMBA 04/09/1940 [id number]  Incoming call received from Mr. Brod daughter, [name].  HIPAA identifiers verified.   She reports that she still needs to get a copy of Mr. Haskew insurance card before she can mail back his patient assistance applications.  She lives 50 minutes from her dad, but states she will try to get a copy tomorrow and get the applications back in the mail to [place].    Plan: Route note to CPhT, Etter Sjogren.  Joetta Manners, PharmD Clinical Pharmacist [place] [phone]

## 2017-10-07 ENCOUNTER — Ambulatory Visit: Payer: Self-pay

## 2017-10-14 ENCOUNTER — Other Ambulatory Visit: Payer: Self-pay | Admitting: Pharmacy Technician

## 2017-10-14 NOTE — Patient Outreach (Signed)
[place] [facility]) Care Management  10/14/2017  [name] [birth year] [id number]  Received patient portion of Eliquis and Amitiza patient assistance applications. Faxed completed application and required documents to [employer] for [employer]. Refaxed provider portion of Amitiza application to Dr. Brigitte Pulse.  Will follow up with with company to check status of application for Eliquis in 7-10 business days.  Will call Dr. Brigitte Pulse office in 2-3 business days if application still have not been received for Amitiza.  [name] [place], [place] Management [phone]

## 2017-10-21 ENCOUNTER — Telehealth: Payer: Self-pay | Admitting: *Deleted

## 2017-10-21 ENCOUNTER — Other Ambulatory Visit: Payer: Self-pay | Admitting: Pharmacy Technician

## 2017-10-21 NOTE — Patient Outreach (Signed)
[facility]) Care Management  10/21/2017  MISHON BLUBAUGH 10/25/1940 [id number]   Follow up call to [name] to check status of patients application for Amitiza. Joelene Millin confirmed patient has been approved as of 8/29 until 02/22/18/ Patient should receive medication in 7-10 business days.  Will follow up with patient in 10-14 business days.  Also received inbasket message from providers office stating he has been approved for Eliquis.  Will follow up with patient in 10-14 business days to confirm medication has been received.  [name] [place], [place] Management [phone]

## 2017-10-21 NOTE — Telephone Encounter (Signed)
Received approval notice from [facility] for patients [place]. I called and spoke with patient to let him know this.

## 2017-10-23 ENCOUNTER — Other Ambulatory Visit: Payer: Self-pay | Admitting: Cardiovascular Disease

## 2017-10-26 ENCOUNTER — Telehealth: Payer: Self-pay

## 2017-10-26 NOTE — Telephone Encounter (Signed)
**Note De-Identified  Obfuscation** BMS pt asst approval on Eliquis is good from 10/21/17 until 02/22/18. Application Case#: [id number]

## 2017-10-26 NOTE — Telephone Encounter (Signed)
I have advised the pt that his Eliquis from [place] pt asst foundation has arrived in the office and that he can pick up at his convenience. He states that he will get someone to pick it up for him as soon as he can.  I also advised the pt that I would call [employer] and ask that they send his next shipment to his address as indicated on his application.  I called [employer] and s/w Hilda Blades who states that she has corrected the address for the pts Eliquis shipment and the next will be delivered to the pt address.

## 2017-11-02 ENCOUNTER — Other Ambulatory Visit: Payer: Self-pay

## 2017-11-02 NOTE — Patient Outreach (Signed)
[facility]) Care Management  11/02/2017  LEXTON HIDALGO Aug 09, 1940 [id number]   Incoming call received from Mr. Nijjar daughter, [name].  HIPAA identifiers verified.  Ms. Ulysees Barns reports that her dad had gotten a call saying he was approved for Eliquis and that he needed to pick it up from his doctor's office.  He called his PCP and they didn't have it, so he was confused.   Informed Ms. Weddington that he had indeed been approved for Elquis and that it was at his cardiologist's office.  I read  the note (10/26/17) from that office to her.  She was very happy and stated that she will pick it up.  Also informed her that he was approved for Amitiza and that he should receive that any day now at his address.  I requested that she call me and let me know when it arrives.    Encouraged Ms. Weddington to call me in late December or early January and we will apply for Amitiza assistance for 2020.  [employer]'s patient assistance is income based and does not require an out of pocket prescription expenditure to be eligible.  Plan:  Will outreach to Mr. Dible or Ms. Weddington in one week to see if he has received his Amitiza and then close his [place] case.  Joetta Manners, PharmD Clinical Pharmacist [place] [phone]

## 2017-11-09 ENCOUNTER — Other Ambulatory Visit: Payer: Self-pay

## 2017-11-09 ENCOUNTER — Ambulatory Visit: Payer: Self-pay

## 2017-11-09 NOTE — Patient Outreach (Signed)
[facility]) Care Management  11/09/2017  [name] Sep 08, 1940 [id number]   Successful outreach to [name] daughter, [name].  HIPAA identifiers verified.   Medication Assistance: [name] states that her father has not received his Amitiza that was approved from [employer]'s patient assistance program.  From 8/29 note, when CPhT Etter Sjogren spoke to [place], it should have been delivered  in 7-10 days.  Plan: Ask CPhT, Etter Sjogren to follow up with [name].  Joetta Manners, PharmD Clinical Pharmacist [place] [phone]

## 2017-11-10 ENCOUNTER — Other Ambulatory Visit: Payer: Self-pay

## 2017-11-10 NOTE — Patient Outreach (Signed)
[place] [facility]) Care Management  11/10/2017  CAILAN GENERAL December 19, 1940 [id number]  Successful outreach to Mr. Bossard daughter, [name]. HIPPA identifiers verified.   Medication Assistance: Verified with daughter, [name] that Mr. Dy has indeed received his Amitiza patient assistance medication from [place].  The medication was sent to his [address] instead of his street address.  Informed daughter that she needs to read the paperwork that accompanied this medication, so she will know to request refills.  She verbalized understanding.   Mr. Ballentine has now received both of his medications that [place] was working on for patient assistance.  Daughter states she has no further medication questions or concerns at this time. Informed her that I will close her father's [place] case.  She is aware that she can contact me in the future should medication issues arise.   Plan: Route case closure letter to PCP, Dr. Brigitte Pulse.   Joetta Manners, PharmD Clinical Pharmacist [place] [phone]

## 2017-12-03 DIAGNOSIS — K5903 Drug induced constipation: Secondary | ICD-10-CM | POA: Diagnosis not present

## 2017-12-03 DIAGNOSIS — I10 Essential (primary) hypertension: Secondary | ICD-10-CM | POA: Diagnosis not present

## 2017-12-03 DIAGNOSIS — M6281 Muscle weakness (generalized): Secondary | ICD-10-CM | POA: Diagnosis not present

## 2017-12-03 DIAGNOSIS — G629 Polyneuropathy, unspecified: Secondary | ICD-10-CM | POA: Diagnosis not present

## 2017-12-03 DIAGNOSIS — M79609 Pain in unspecified limb: Secondary | ICD-10-CM | POA: Diagnosis not present

## 2017-12-03 DIAGNOSIS — R269 Unspecified abnormalities of gait and mobility: Secondary | ICD-10-CM | POA: Diagnosis not present

## 2017-12-03 DIAGNOSIS — Z79891 Long term (current) use of opiate analgesic: Secondary | ICD-10-CM | POA: Diagnosis not present

## 2017-12-07 DIAGNOSIS — Z23 Encounter for immunization: Secondary | ICD-10-CM | POA: Diagnosis not present

## 2017-12-17 DIAGNOSIS — F419 Anxiety disorder, unspecified: Secondary | ICD-10-CM | POA: Diagnosis not present

## 2017-12-17 DIAGNOSIS — I69398 Other sequelae of cerebral infarction: Secondary | ICD-10-CM | POA: Diagnosis not present

## 2017-12-17 DIAGNOSIS — Z7901 Long term (current) use of anticoagulants: Secondary | ICD-10-CM | POA: Diagnosis not present

## 2017-12-17 DIAGNOSIS — M6281 Muscle weakness (generalized): Secondary | ICD-10-CM | POA: Diagnosis not present

## 2017-12-17 DIAGNOSIS — I129 Hypertensive chronic kidney disease with stage 1 through stage 4 chronic kidney disease, or unspecified chronic kidney disease: Secondary | ICD-10-CM | POA: Diagnosis not present

## 2017-12-17 DIAGNOSIS — G629 Polyneuropathy, unspecified: Secondary | ICD-10-CM | POA: Diagnosis not present

## 2017-12-17 DIAGNOSIS — I7389 Other specified peripheral vascular diseases: Secondary | ICD-10-CM | POA: Diagnosis not present

## 2017-12-17 DIAGNOSIS — N183 Chronic kidney disease, stage 3 (moderate): Secondary | ICD-10-CM | POA: Diagnosis not present

## 2017-12-17 DIAGNOSIS — G6289 Other specified polyneuropathies: Secondary | ICD-10-CM | POA: Diagnosis not present

## 2017-12-17 DIAGNOSIS — I739 Peripheral vascular disease, unspecified: Secondary | ICD-10-CM | POA: Diagnosis not present

## 2017-12-17 DIAGNOSIS — Z9181 History of falling: Secondary | ICD-10-CM | POA: Diagnosis not present

## 2017-12-17 DIAGNOSIS — I252 Old myocardial infarction: Secondary | ICD-10-CM | POA: Diagnosis not present

## 2017-12-17 DIAGNOSIS — J449 Chronic obstructive pulmonary disease, unspecified: Secondary | ICD-10-CM | POA: Diagnosis not present

## 2017-12-17 DIAGNOSIS — I2510 Atherosclerotic heart disease of native coronary artery without angina pectoris: Secondary | ICD-10-CM | POA: Diagnosis not present

## 2017-12-17 DIAGNOSIS — K5903 Drug induced constipation: Secondary | ICD-10-CM | POA: Diagnosis not present

## 2017-12-17 DIAGNOSIS — F418 Other specified anxiety disorders: Secondary | ICD-10-CM | POA: Diagnosis not present

## 2017-12-21 DIAGNOSIS — J449 Chronic obstructive pulmonary disease, unspecified: Secondary | ICD-10-CM | POA: Diagnosis not present

## 2017-12-21 DIAGNOSIS — G629 Polyneuropathy, unspecified: Secondary | ICD-10-CM | POA: Diagnosis not present

## 2017-12-21 DIAGNOSIS — M6281 Muscle weakness (generalized): Secondary | ICD-10-CM | POA: Diagnosis not present

## 2017-12-21 DIAGNOSIS — I69398 Other sequelae of cerebral infarction: Secondary | ICD-10-CM | POA: Diagnosis not present

## 2017-12-21 DIAGNOSIS — I739 Peripheral vascular disease, unspecified: Secondary | ICD-10-CM | POA: Diagnosis not present

## 2017-12-21 DIAGNOSIS — I2510 Atherosclerotic heart disease of native coronary artery without angina pectoris: Secondary | ICD-10-CM | POA: Diagnosis not present

## 2017-12-23 DIAGNOSIS — J449 Chronic obstructive pulmonary disease, unspecified: Secondary | ICD-10-CM | POA: Diagnosis not present

## 2017-12-23 DIAGNOSIS — I69398 Other sequelae of cerebral infarction: Secondary | ICD-10-CM | POA: Diagnosis not present

## 2017-12-23 DIAGNOSIS — M6281 Muscle weakness (generalized): Secondary | ICD-10-CM | POA: Diagnosis not present

## 2017-12-23 DIAGNOSIS — G629 Polyneuropathy, unspecified: Secondary | ICD-10-CM | POA: Diagnosis not present

## 2017-12-23 DIAGNOSIS — I739 Peripheral vascular disease, unspecified: Secondary | ICD-10-CM | POA: Diagnosis not present

## 2017-12-23 DIAGNOSIS — I2510 Atherosclerotic heart disease of native coronary artery without angina pectoris: Secondary | ICD-10-CM | POA: Diagnosis not present

## 2017-12-28 DIAGNOSIS — M6281 Muscle weakness (generalized): Secondary | ICD-10-CM | POA: Diagnosis not present

## 2017-12-28 DIAGNOSIS — G629 Polyneuropathy, unspecified: Secondary | ICD-10-CM | POA: Diagnosis not present

## 2017-12-28 DIAGNOSIS — I69398 Other sequelae of cerebral infarction: Secondary | ICD-10-CM | POA: Diagnosis not present

## 2017-12-28 DIAGNOSIS — I2510 Atherosclerotic heart disease of native coronary artery without angina pectoris: Secondary | ICD-10-CM | POA: Diagnosis not present

## 2017-12-28 DIAGNOSIS — J449 Chronic obstructive pulmonary disease, unspecified: Secondary | ICD-10-CM | POA: Diagnosis not present

## 2017-12-28 DIAGNOSIS — I739 Peripheral vascular disease, unspecified: Secondary | ICD-10-CM | POA: Diagnosis not present

## 2017-12-30 DIAGNOSIS — I2510 Atherosclerotic heart disease of native coronary artery without angina pectoris: Secondary | ICD-10-CM | POA: Diagnosis not present

## 2017-12-30 DIAGNOSIS — J449 Chronic obstructive pulmonary disease, unspecified: Secondary | ICD-10-CM | POA: Diagnosis not present

## 2017-12-30 DIAGNOSIS — I739 Peripheral vascular disease, unspecified: Secondary | ICD-10-CM | POA: Diagnosis not present

## 2017-12-30 DIAGNOSIS — G629 Polyneuropathy, unspecified: Secondary | ICD-10-CM | POA: Diagnosis not present

## 2017-12-30 DIAGNOSIS — M6281 Muscle weakness (generalized): Secondary | ICD-10-CM | POA: Diagnosis not present

## 2017-12-30 DIAGNOSIS — I69398 Other sequelae of cerebral infarction: Secondary | ICD-10-CM | POA: Diagnosis not present

## 2018-01-04 DIAGNOSIS — I2510 Atherosclerotic heart disease of native coronary artery without angina pectoris: Secondary | ICD-10-CM | POA: Diagnosis not present

## 2018-01-04 DIAGNOSIS — I69398 Other sequelae of cerebral infarction: Secondary | ICD-10-CM | POA: Diagnosis not present

## 2018-01-04 DIAGNOSIS — M6281 Muscle weakness (generalized): Secondary | ICD-10-CM | POA: Diagnosis not present

## 2018-01-04 DIAGNOSIS — G629 Polyneuropathy, unspecified: Secondary | ICD-10-CM | POA: Diagnosis not present

## 2018-01-04 DIAGNOSIS — J449 Chronic obstructive pulmonary disease, unspecified: Secondary | ICD-10-CM | POA: Diagnosis not present

## 2018-01-04 DIAGNOSIS — I739 Peripheral vascular disease, unspecified: Secondary | ICD-10-CM | POA: Diagnosis not present

## 2018-01-06 DIAGNOSIS — I69398 Other sequelae of cerebral infarction: Secondary | ICD-10-CM | POA: Diagnosis not present

## 2018-01-06 DIAGNOSIS — I739 Peripheral vascular disease, unspecified: Secondary | ICD-10-CM | POA: Diagnosis not present

## 2018-01-06 DIAGNOSIS — I2510 Atherosclerotic heart disease of native coronary artery without angina pectoris: Secondary | ICD-10-CM | POA: Diagnosis not present

## 2018-01-06 DIAGNOSIS — G629 Polyneuropathy, unspecified: Secondary | ICD-10-CM | POA: Diagnosis not present

## 2018-01-06 DIAGNOSIS — J449 Chronic obstructive pulmonary disease, unspecified: Secondary | ICD-10-CM | POA: Diagnosis not present

## 2018-01-06 DIAGNOSIS — M6281 Muscle weakness (generalized): Secondary | ICD-10-CM | POA: Diagnosis not present

## 2018-01-12 ENCOUNTER — Other Ambulatory Visit: Payer: Self-pay

## 2018-01-12 NOTE — Patient Outreach (Signed)
[facility]) Care Management  [place] 01/12/2018  ORBIN MAYEUX 09/07/1940 [id number]  Successful outreach attempt to Mr. Fugitt daughter, [name].  HIPAA identifiers verified.   Ms. Ulysees Barns verifies that Mr. Mall is still taking Amitiza.  Informed her that [place] will be sending her a 2020 medication assistance application to complete for Mr. Gauthreaux.  She states she is very Patent attorney.   Plan: Route note to [place], Etter Sjogren.  Joetta Manners, PharmD Clinical Pharmacist [place] [phone]

## 2018-01-13 ENCOUNTER — Other Ambulatory Visit: Payer: Self-pay | Admitting: Pharmacy Technician

## 2018-01-13 DIAGNOSIS — I2510 Atherosclerotic heart disease of native coronary artery without angina pectoris: Secondary | ICD-10-CM | POA: Diagnosis not present

## 2018-01-13 DIAGNOSIS — I69398 Other sequelae of cerebral infarction: Secondary | ICD-10-CM | POA: Diagnosis not present

## 2018-01-13 DIAGNOSIS — G629 Polyneuropathy, unspecified: Secondary | ICD-10-CM | POA: Diagnosis not present

## 2018-01-13 DIAGNOSIS — M6281 Muscle weakness (generalized): Secondary | ICD-10-CM | POA: Diagnosis not present

## 2018-01-13 DIAGNOSIS — I739 Peripheral vascular disease, unspecified: Secondary | ICD-10-CM | POA: Diagnosis not present

## 2018-01-13 DIAGNOSIS — J449 Chronic obstructive pulmonary disease, unspecified: Secondary | ICD-10-CM | POA: Diagnosis not present

## 2018-01-13 NOTE — Patient Outreach (Signed)
[facility]) Care Management  01/13/2018  JOHNERIC MCFADDEN 1940-06-29 [id number]  Informed by [facility] RPh Joetta Manners that patient is interested in reapplying for Amitiza thru [employer] patient assistance for 2020. Prepared patient portion of application to be mailed and faxed provider portion to Dr. Brigitte Pulse.  Will follow up with patients daughter in 7-10 business days (due to holiday) to confirm application has been received.  [name] [place] Certified Pharmacy Technician [place] Management Direct Dial:[phone]

## 2018-01-17 ENCOUNTER — Other Ambulatory Visit: Payer: Self-pay

## 2018-01-17 ENCOUNTER — Emergency Department (HOSPITAL_COMMUNITY)
Admission: EM | Admit: 2018-01-17 | Discharge: 2018-01-18 | Disposition: A | Discharge status: Other Acute Inpatient Hospital | Payer: Medicare Other | Attending: Emergency Medicine | Admitting: Emergency Medicine

## 2018-01-17 DIAGNOSIS — Z8673 Personal history of transient ischemic attack (TIA), and cerebral infarction without residual deficits: Secondary | ICD-10-CM | POA: Diagnosis not present

## 2018-01-17 DIAGNOSIS — Z7901 Long term (current) use of anticoagulants: Secondary | ICD-10-CM | POA: Insufficient documentation

## 2018-01-17 DIAGNOSIS — F17210 Nicotine dependence, cigarettes, uncomplicated: Secondary | ICD-10-CM | POA: Insufficient documentation

## 2018-01-17 DIAGNOSIS — J449 Chronic obstructive pulmonary disease, unspecified: Secondary | ICD-10-CM | POA: Insufficient documentation

## 2018-01-17 DIAGNOSIS — G629 Polyneuropathy, unspecified: Secondary | ICD-10-CM | POA: Diagnosis not present

## 2018-01-17 DIAGNOSIS — N183 Chronic kidney disease, stage 3 (moderate): Secondary | ICD-10-CM | POA: Insufficient documentation

## 2018-01-17 DIAGNOSIS — R45851 Suicidal ideations: Secondary | ICD-10-CM | POA: Diagnosis not present

## 2018-01-17 DIAGNOSIS — M6281 Muscle weakness (generalized): Secondary | ICD-10-CM | POA: Diagnosis not present

## 2018-01-17 DIAGNOSIS — F99 Mental disorder, not otherwise specified: Secondary | ICD-10-CM | POA: Diagnosis present

## 2018-01-17 DIAGNOSIS — I2510 Atherosclerotic heart disease of native coronary artery without angina pectoris: Secondary | ICD-10-CM | POA: Diagnosis not present

## 2018-01-17 DIAGNOSIS — I69398 Other sequelae of cerebral infarction: Secondary | ICD-10-CM | POA: Diagnosis not present

## 2018-01-17 DIAGNOSIS — Z79899 Other long term (current) drug therapy: Secondary | ICD-10-CM | POA: Diagnosis not present

## 2018-01-17 DIAGNOSIS — Z9049 Acquired absence of other specified parts of digestive tract: Secondary | ICD-10-CM | POA: Diagnosis not present

## 2018-01-17 DIAGNOSIS — F419 Anxiety disorder, unspecified: Secondary | ICD-10-CM | POA: Insufficient documentation

## 2018-01-17 DIAGNOSIS — F332 Major depressive disorder, recurrent severe without psychotic features: Secondary | ICD-10-CM | POA: Diagnosis not present

## 2018-01-17 DIAGNOSIS — I252 Old myocardial infarction: Secondary | ICD-10-CM | POA: Diagnosis not present

## 2018-01-17 DIAGNOSIS — I739 Peripheral vascular disease, unspecified: Secondary | ICD-10-CM | POA: Diagnosis not present

## 2018-01-17 DIAGNOSIS — I129 Hypertensive chronic kidney disease with stage 1 through stage 4 chronic kidney disease, or unspecified chronic kidney disease: Secondary | ICD-10-CM | POA: Insufficient documentation

## 2018-01-17 LAB — ETHANOL: Alcohol, Ethyl (B): <10 mg/dL (ref <10)

## 2018-01-17 LAB — COMPREHENSIVE METABOLIC PANEL
ALT: 8 U/L (ref 0–44)
AST: 17 U/L (ref 15–41)
Albumin: 3.2 g/dL — ABNORMAL LOW (ref 3.5–5.0)
Alkaline Phosphatase: 74 U/L (ref 38–126)
Anion gap: 9 (ref 5–15)
BUN: 13 mg/dL (ref 8–23)
CO2: 28 mmol/L (ref 22–32)
Calcium: 9.1 mg/dL (ref 8.9–10.3)
Chloride: 102 mmol/L (ref 98–111)
Creatinine, Ser: 1.49 mg/dL — ABNORMAL HIGH (ref 0.61–1.24)
GFR calc Af Amer: 50 mL/min — ABNORMAL LOW (ref >60)
GFR calc non Af Amer: 43 mL/min — ABNORMAL LOW (ref >60)
Glucose, Bld: 113 mg/dL — ABNORMAL HIGH (ref 70–99)
Potassium: 3.6 mmol/L (ref 3.5–5.1)
Sodium: 139 mmol/L (ref 135–145)
Total Bilirubin: 0.7 mg/dL (ref 0.3–1.2)
Total Protein: 6.9 g/dL (ref 6.5–8.1)

## 2018-01-17 LAB — CBC
HCT: 43.8 % (ref 39.0–52.0)
Hemoglobin: 14 g/dL (ref 13.0–17.0)
MCH: 30.6 pg (ref 26.0–34.0)
MCHC: 32 g/dL (ref 30.0–36.0)
MCV: 95.8 fL (ref 80.0–100.0)
Platelets: 241 10*3/uL (ref 150–400)
RBC: 4.57 MIL/uL (ref 4.22–5.81)
RDW: 14.6 % (ref 11.5–15.5)
WBC: 7.3 10*3/uL (ref 4.0–10.5)
nRBC: 0 % (ref 0.0–0.2)

## 2018-01-17 LAB — ACETAMINOPHEN LEVEL: Acetaminophen (Tylenol), Serum: <10 ug/mL — ABNORMAL LOW (ref 10–30)

## 2018-01-17 LAB — SALICYLATE LEVEL: Salicylate Lvl: <7 mg/dL (ref 2.8–30.0)

## 2018-01-17 MED ORDER — SERTRALINE HCL 100 MG PO TABS
100.0000 mg | ORAL_TABLET | Freq: Every day | ORAL | Status: DC
  Administered 2018-01-18: 100 mg via ORAL
  Filled 2018-01-17: qty 1

## 2018-01-17 MED ORDER — HYDROCODONE-ACETAMINOPHEN 5-325 MG PO TABS: 2.0000 | ORAL_TABLET | Freq: Once | ORAL | Status: DC

## 2018-01-17 MED ORDER — APIXABAN 5 MG PO TABS
5.0000 mg | ORAL_TABLET | Freq: Two times a day (BID) | ORAL | Status: DC
  Administered 2018-01-18 (×2): 5 mg via ORAL
  Filled 2018-01-17 (×2): qty 1

## 2018-01-17 MED ORDER — SIMVASTATIN 40 MG PO TABS
40.0000 mg | ORAL_TABLET | Freq: Every day | ORAL | Status: DC
  Administered 2018-01-18: 40 mg via ORAL
  Filled 2018-01-17: qty 1

## 2018-01-17 MED ORDER — ACETAMINOPHEN 500 MG PO TABS
1000.0000 mg | ORAL_TABLET | Freq: Once | ORAL | Status: AC
  Administered 2018-01-17: 1000 mg via ORAL
  Filled 2018-01-17: qty 2

## 2018-01-17 MED ORDER — LISINOPRIL 10 MG PO TABS
10.0000 mg | ORAL_TABLET | Freq: Every day | ORAL | Status: DC
  Administered 2018-01-18 (×2): 10 mg via ORAL
  Filled 2018-01-17 (×2): qty 1

## 2018-01-17 NOTE — BH Assessment (Signed)
PA Patriciaann Clan recommends inpatient treatment.

## 2018-01-17 NOTE — BH Assessment (Addendum)
Tele Assessment Note   Patient Name: [name] MRN: [id number] Referring Physician: Ashok Cordia  Location of Patient: [facility] ED Location of Provider: [place] Anselmo is an 77 y.o. male.  The pt came in after running out of hydrocodone.  The pt told an RN he wants to shoot himself with a gun.  The pt has a gun at home.  He currently denies SI, but stated the suicidal thoughts may come back.  He denies any suicide attempts in the past and he denies a family history of suicides.  He is currently seeing a psychiatrist and has been seeing the psychiatrist for the past 7 years due to depression.  The pt is not seeing a Social worker.  The pt lives alone.  He denies self harm, HI, legal issues, history of abuse and hallucinations.  The pt stated he is sleeping about 4 hours a night and has a poor appetite.  The pt reported feeling hopeless and feeling bad about himself.  He denies SA.  Pt is dressed in scrubs. He is alert and oriented x4. Pt speaks in a clear tone, at moderate volume and normal pace. Eye contact is good. Pt's mood is depressed. Thought process is coherent and relevant. There is no indication Pt is currently responding to internal stimuli or experiencing delusional thought content.?Pt was cooperative throughout assessment.     Diagnosis:  F33.2 Major depressive disorder, Recurrent episode, Severe  Past Medical History:  Past Medical History:  Diagnosis Date  . Anxiety   . Atrial fibrillation ([place])   . Bradycardia   . Carotid artery occlusion   . Cataract    bilataeral cateracts removed  . CKD (chronic kidney disease), stage III ([place])   . Colon polyps   . Common bile duct stone   . COPD (chronic obstructive pulmonary disease) ([place])   . 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 CVA (cerebral vascular accident) ([place])   . Depression   . DVT (deep venous thrombosis) ([place])   . Dyslipidemia   . Essential  hypertension   . Foot pain   . GERD (gastroesophageal reflux disease)   . History of benign prostatic hypertrophy   . History of leukocytosis   . Hypertension   . MI, old   . Neuromuscular disorder (HCC)    neuropathy  . Neuropathy   . Peripheral vascular disease ([place])   . Renal failure, chronic    stage 111  . Tobacco abuse     Past Surgical History:  Procedure Laterality Date  . ANKLE SURGERY    . BYPASS GRAFT  2009 or  2010   stent  . CHOLECYSTECTOMY     Gall Bladder  . COLONOSCOPY    . intervention of the RCA     using a single BMS    Family History:  Family History  Problem Relation Age of Onset  . Stroke Mother   . Hypertension Mother   . Heart attack Mother   . Heart attack Brother   . Cancer Brother   . Hypertension Father   . Heart attack Father   . CVA Father   . Heart disease Brother   . Hypertension Brother   . AAA (abdominal aortic aneurysm) Brother   . Heart disease Brother   . Colon cancer Neg Hx   . Esophageal cancer Neg Hx   . Rectal cancer Neg Hx   . Stomach cancer Neg  Hx     Social History:  reports that he has been smoking cigarettes. He has been smoking about 1.00 pack per day. He uses smokeless tobacco. He reports that he does not drink alcohol or use drugs.  Additional Social History:  Alcohol / Drug Use Pain Medications: See MAR Prescriptions: See MAR Over the Counter: See MAR History of alcohol / drug use?: No history of alcohol / drug abuse Longest period of sobriety (when/how long): NA  CIWA: CIWA-Ar BP: (!) 146/71 Pulse Rate: 83 COWS:    Allergies:  Allergies  Allergen Reactions  . Hctz [Hydrochlorothiazide] Anaphylaxis and Other (See Comments)    Throat Swelling  . Amitriptyline Other (See Comments)    Made me go crazy  . Gabapentin Other (See Comments)    Made me go crazy  . Metoprolol Tartrate Other (See Comments)    fatigue  . Paroxetine Other (See Comments)    Made me go crazy  . Spiriva Handihaler  [Tiotropium Bromide Monohydrate] Other (See Comments)    Urinary retention    Home Medications:  (Not in a hospital admission)  OB/GYN Status:  No LMP for male patient.  General Assessment Data Location of Assessment: [facility] ED TTS Assessment: In system Is this a Tele or Face-to-Face Assessment?: Face-to-Face Is this an Initial Assessment or a Re-assessment for this encounter?: Initial Assessment Patient Accompanied by:: N/A Language Other than English: No Living Arrangements: Other (Comment)(home) What gender do you identify as?: Male Marital status: Single Maiden name: NA Pregnancy Status: No Living Arrangements: Alone Can pt return to current living arrangement?: Yes Admission Status: Voluntary Is patient capable of signing voluntary admission?: Yes Referral Source: Self/Family/Friend Insurance type: Medicare     Crisis Care Plan Living Arrangements: Alone Legal Guardian: Other:(Self) Name of Psychiatrist: unknown psychiatrist Name of Therapist: none  Education Status Is patient currently in school?: No Is the patient employed, unemployed or receiving disability?: Receiving disability income  Risk to self with the past 6 months Suicidal Ideation: Yes-Currently Present Has patient been a risk to self within the past 6 months prior to admission? : Yes Suicidal Intent: Yes-Currently Present Has patient had any suicidal intent within the past 6 months prior to admission? : Yes Is patient at risk for suicide?: Yes Suicidal Plan?: Yes-Currently Present Has patient had any suicidal plan within the past 6 months prior to admission? : Yes Specify Current Suicidal Plan: shoot self with a gun Access to Means: Yes Specify Access to Suicidal Means: has a gun at home What has been your use of drugs/alcohol within the last 12 months?: none Previous Attempts/Gestures: No How many times?: 0 Other Self Harm Risks: none Triggers for Past Attempts: None known Intentional Self  Injurious Behavior: None Family Suicide History: No Recent stressful life event(s): Recent negative physical changes(from pain) Persecutory voices/beliefs?: No Depression: Yes Depression Symptoms: Insomnia, Despondent, Feeling worthless/self pity Substance abuse history and/or treatment for substance abuse?: No Suicide prevention information given to non-admitted patients: Yes  Risk to Others within the past 6 months Homicidal Ideation: No Does patient have any lifetime risk of violence toward others beyond the six months prior to admission? : No Thoughts of Harm to Others: No Current Homicidal Intent: No Current Homicidal Plan: No Access to Homicidal Means: No Identified Victim: none History of harm to others?: No Assessment of Violence: None Noted Violent Behavior Description: none Does patient have access to weapons?: Yes (Comment) Criminal Charges Pending?: No Does patient have a court date: No Is patient  on probation?: No  Psychosis Hallucinations: None noted Delusions: None noted  Mental Status Report Appearance/Hygiene: In scrubs Eye Contact: Good Motor Activity: Unable to assess Speech: Logical/coherent Level of Consciousness: Alert Mood: Depressed Affect: Depressed Anxiety Level: None Thought Processes: Coherent, Relevant Judgement: Impaired Orientation: Person, Place, Time, Situation Obsessive Compulsive Thoughts/Behaviors: None  Cognitive Functioning Concentration: Normal Memory: Recent Intact, Remote Intact Is patient IDD: No Insight: Poor Impulse Control: Poor Appetite: Poor Have you had any weight changes? : No Change Sleep: Decreased Total Hours of Sleep: 4 Vegetative Symptoms: None  ADLScreening [facility] Assessment Services) Patient's cognitive ability adequate to safely complete daily activities?: Yes Patient able to express need for assistance with ADLs?: Yes Independently performs ADLs?: Yes (appropriate for developmental age)  Prior  Inpatient Therapy Prior Inpatient Therapy: No  Prior Outpatient Therapy Prior Outpatient Therapy: Yes Prior Therapy Dates: current Prior Therapy Facilty/Provider(s): unknown psychiatrist Reason for Treatment: depression Does patient have an ACCT team?: No Does patient have Intensive In-House Services?  : No Does patient have [facility] services? : No Does patient have P4CC services?: No  ADL Screening (condition at time of admission) Patient's cognitive ability adequate to safely complete daily activities?: Yes Patient able to express need for assistance with ADLs?: Yes Independently performs ADLs?: Yes (appropriate for developmental age)       Abuse/Neglect Assessment (Assessment to be complete while patient is alone) Abuse/Neglect Assessment Can Be Completed: Yes Physical Abuse: Denies Verbal Abuse: Denies Sexual Abuse: Denies Exploitation of patient/patient's resources: Denies Self-Neglect: Denies Values / Beliefs Cultural Requests During Hospitalization: None Spiritual Requests During Hospitalization: None Consults Spiritual Care Consult Needed: No Social Work Consult Needed: No Regulatory affairs officer (For Healthcare) Does Patient Have a Medical Advance Directive?: No          Disposition:  Disposition Initial Assessment Completed for this Encounter: Yes   PA Patriciaann Clan recommends inpatient treatment.  RN Beckie Busing was made aware of the recommendation.  This service was provided via telemedicine using a 2-way, interactive audio and video technology.  Names of all persons participating in this telemedicine service and their role in this encounter. Name: [name] Role: Pt  Name: Virgina Organ Role: TTS  Name:  Role:   Name:  Role:     Enzo Montgomery 01/17/2018 11:06 PM

## 2018-01-17 NOTE — ED Notes (Signed)
TTS in process 

## 2018-01-17 NOTE — ED Notes (Signed)
61ml on bladder scan

## 2018-01-17 NOTE — ED Provider Notes (Signed)
[facility] EMERGENCY DEPARTMENT Provider Note   CSN: [id number] Arrival date & time: 01/17/18  2009     History   Chief Complaint Chief Complaint  Patient presents with  . Suicidal    HPI [name] is a 77 y.o. male.  Patient presents from home - pt indicates is out of his hydrocodone - normally gets a new rx q month, but had been taking it more often than prescribed, and ran out 2 days ago. Since running out he feels 'shaky on the inside', restless, and uncomfortable. States takes hydrocodone on chronic basis for neuropathy in both feet. Today pt voicing suicidal thoughts, ?related to running out of pain meds. Pts daughter notes hx anxiety and depression. Pt does have gun at home, and was voicing thoughts of suicide. Daughter also has concerns about pts ability to care for self at home due to physical deconditioning, limited mobility.   The history is provided by the patient and a relative.    Past Medical History:  Diagnosis Date  . Anxiety   . Atrial fibrillation ([place])   . Bradycardia   . Carotid artery occlusion   . Cataract    bilataeral cateracts removed  . CKD (chronic kidney disease), stage III ([place])   . Colon polyps   . Common bile duct stone   . COPD (chronic obstructive pulmonary disease) ([place])   . 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 CVA (cerebral vascular accident) ([place])   . Depression   . DVT (deep venous thrombosis) ([place])   . Dyslipidemia   . Essential hypertension   . Foot pain   . GERD (gastroesophageal reflux disease)   . History of benign prostatic hypertrophy   . History of leukocytosis   . Hypertension   . MI, old   . Neuromuscular disorder (HCC)    neuropathy  . Neuropathy   . Peripheral vascular disease ([place])   . Renal failure, chronic    stage 111  . Tobacco abuse     Patient Active Problem List   Diagnosis Date Noted  . Chest pain, rule out acute myocardial  infarction   . Chest pain 03/21/2015  . Dysphagia 03/21/2015  . CKD (chronic kidney disease), stage III ([place])   . CN (constipation) 11/13/2014  . Dysphagia, pharyngoesophageal phase 11/13/2014  . Coronary artery disease involving native coronary artery of native heart without angina pectoris 07/31/2014  . Chronic atrial fibrillation ([place]) 07/31/2014  . Adenomatous colon polyp 03/01/2014  . Swelling of limb- Right Leg > Left leg 09/13/2013  . Carotid stenosis 09/08/2012  . HLD (hyperlipidemia) 09/03/2008  . LEUKOCYTOSIS 09/03/2008  . Anxiety state 09/03/2008  . TOBACCO ABUSE 09/03/2008  . NEUROPATHY 09/03/2008  . Essential hypertension 09/03/2008  . Coronary atherosclerosis 09/03/2008  . BRADYCARDIA 09/03/2008  . CVA 09/03/2008  . GASTROESOPHAGEAL REFLUX DISEASE 09/03/2008  . BPH (benign prostatic hyperplasia) 09/03/2008    Past Surgical History:  Procedure Laterality Date  . ANKLE SURGERY    . BYPASS GRAFT  2009 or  2010   stent  . CHOLECYSTECTOMY     Gall Bladder  . COLONOSCOPY    . intervention of the RCA     using a single BMS        Home Medications    Prior to Admission medications   Medication Sig Start Date End Date Taking? Authorizing Provider  apixaban (ELIQUIS) 5 MG TABS tablet Take 5 mg by  mouth 2 (two) times daily.    [provider]  diazepam (VALIUM) 5 MG tablet Take 5 mg by mouth every 6 (six) hours as needed for anxiety. Reported on 08/23/2015    [provider]  finasteride (PROSCAR) 5 MG tablet Take 5 mg by mouth daily.  08/17/12   [provider]  furosemide (LASIX) 20 MG tablet Take 20 mg by mouth 2 (two) times daily. 07/11/13   [provider]  HYDROcodone-acetaminophen (NORCO) 7.5-325 MG tablet Take 1 tablet by mouth every 6 (six) hours as needed. for pain 03/08/15   [provider]  isosorbide mononitrate (IMDUR) 30 MG 24 hr tablet Take 1 tablet (30 mg total) by mouth daily. 06/14/17   Sherren Mocha, MD    KLOR-CON M20 20 MEQ tablet TAKE 1 TABLET DAILY 10/26/17   Sherren Mocha, MD  lisinopril (PRINIVIL,ZESTRIL) 10 MG tablet Take 10 mg by mouth daily. 09/13/16   [provider]  lubiprostone (AMITIZA) 8 MCG capsule Take 8 mcg by mouth 2 (two) times daily with a meal.    [provider]  nitroGLYCERIN (NITROSTAT) 0.4 MG SL tablet Place 1 tablet (0.4 mg total) under the tongue every 5 (five) minutes as needed for chest pain. 06/05/15   Sherren Mocha, MD  omeprazole (PRILOSEC) 40 MG capsule Take 1 capsule (40 mg total) by mouth daily. 03/13/14   Pyrtle, Lajuan Lines, MD  risperiDONE (RISPERDAL) 1 MG tablet Take 1 mg by mouth at bedtime. 09/23/16   [provider]  sertraline (ZOLOFT) 100 MG tablet Take 100 mg by mouth at bedtime.  03/13/15   [provider]  simvastatin (ZOCOR) 40 MG tablet Take 1 tablet (40 mg total) by mouth at bedtime. 08/05/17   Sherren Mocha, MD    Family History Family History  Problem Relation Age of Onset  . Stroke Mother   . Hypertension Mother   . Heart attack Mother   . Heart attack Brother   . Cancer Brother   . Hypertension Father   . Heart attack Father   . CVA Father   . Heart disease Brother   . Hypertension Brother   . AAA (abdominal aortic aneurysm) Brother   . Heart disease Brother   . Colon cancer Neg Hx   . Esophageal cancer Neg Hx   . Rectal cancer Neg Hx   . Stomach cancer Neg Hx     Social History Social History   Tobacco Use  . Smoking status: Current Every Day Smoker    Packs/day: 1.00    Types: Cigarettes  . Smokeless tobacco: Current User    Last attempt to quit: 09/13/2012  . Tobacco comment: currently using e-cigarettes and regular cigarettes.  Substance Use Topics  . Alcohol use: No    Alcohol/week: 0.0 standard drinks  . Drug use: No     Allergies   Hctz [hydrochlorothiazide]; Amitriptyline; Gabapentin; Metoprolol tartrate; Paroxetine; and Spiriva handihaler [tiotropium bromide  monohydrate]   Review of Systems Review of Systems  Constitutional: Negative for fever.  HENT: Negative for sore throat.   Eyes: Negative for redness.  Respiratory: Negative for shortness of breath.   Cardiovascular: Negative for chest pain.  Gastrointestinal: Negative for abdominal pain.  Genitourinary: Negative for flank pain.  Musculoskeletal: Negative for back pain.  Skin: Negative for rash.  Neurological: Negative for headaches.  Hematological: Does not bruise/bleed easily.  Psychiatric/Behavioral: Positive for dysphoric mood and suicidal ideas.     Physical Exam Updated Vital Signs BP (!) 146/75 (  BP Location: Right Arm)   Pulse 100   Temp 97.8 F (36.6 C) (Oral)   Resp 18   SpO2 96%   Physical Exam  Constitutional: He appears well-developed and well-nourished. No distress.  HENT:  Head: Atraumatic.  Mouth/Throat: Oropharynx is clear and moist.  Eyes: Pupils are equal, round, and reactive to light. No scleral icterus.  Neck: Neck supple. No tracheal deviation present.  Cardiovascular: Normal rate, regular rhythm, normal heart sounds and intact distal pulses.  Pulmonary/Chest: Effort normal and breath sounds normal. No accessory muscle usage. No respiratory distress.  Abdominal: Soft. Bowel sounds are normal. He exhibits no distension. There is no tenderness.  Musculoskeletal: Normal range of motion. He exhibits no edema.  Neurological: He is alert.  Speech slow, mildly dysarthric - family indicates baseline for pt. Motor/sens grossly intact bil.   Skin: Skin is warm and dry. No rash noted.  Psychiatric:  Flat affect.   Nursing note and vitals reviewed.    ED Treatments / Results  Labs (all labs ordered are listed, but only abnormal results are displayed) Results for orders placed or performed during the hospital encounter of 01/17/18  Comprehensive metabolic panel  Result Value Ref Range   Sodium 139 135 - 145 mmol/L   Potassium 3.6 3.5 - 5.1 mmol/L    Chloride 102 98 - 111 mmol/L   CO2 28 22 - 32 mmol/L   Glucose, Bld 113 (H) 70 - 99 mg/dL   BUN 13 8 - 23 mg/dL   Creatinine, Ser 1.49 (H) 0.61 - 1.24 mg/dL   Calcium 9.1 8.9 - 10.3 mg/dL   Total Protein 6.9 6.5 - 8.1 g/dL   Albumin 3.2 (L) 3.5 - 5.0 g/dL   AST 17 15 - 41 U/L   ALT 8 0 - 44 U/L   Alkaline Phosphatase 74 38 - 126 U/L   Total Bilirubin 0.7 0.3 - 1.2 mg/dL   GFR calc non Af Amer 43 (L) >60 mL/min   GFR calc Af Amer 50 (L) >60 mL/min   Anion gap 9 5 - 15  Ethanol  Result Value Ref Range   Alcohol, Ethyl (B) <16 <10 mg/dL  Salicylate level  Result Value Ref Range   Salicylate Lvl <9.6 2.8 - 30.0 mg/dL  Acetaminophen level  Result Value Ref Range   Acetaminophen (Tylenol), Serum <10 (L) 10 - 30 ug/mL  cbc  Result Value Ref Range   WBC 7.3 4.0 - 10.5 K/uL   RBC 4.57 4.22 - 5.81 MIL/uL   Hemoglobin 14.0 13.0 - 17.0 g/dL   HCT 43.8 39.0 - 52.0 %   MCV 95.8 80.0 - 100.0 fL   MCH 30.6 26.0 - 34.0 pg   MCHC 32.0 30.0 - 36.0 g/dL   RDW 14.6 11.5 - 15.5 %   Platelets 241 150 - 400 K/uL   nRBC 0.0 0.0 - 0.2 %    EKG None  Radiology No results found.  Procedures Procedures (including critical care time)  Medications Ordered in ED Medications - No data to display   Initial Impression / Assessment and Plan / ED Course  I have reviewed the triage vital signs and the nursing notes.  Pertinent labs & imaging results that were available during my care of the patient were reviewed by me and considered in my medical decision making (see chart for details).  Labs sent.   [facility] team consulted.  Reviewed nursing notes and prior charts for additional history.   Acetaminophen po.  Labs  reviewed - chem normal.   BH consult pending - dispositon per [facility] team.     Final Clinical Impressions(s) / ED Diagnoses   Final diagnoses:  None    ED Discharge Orders    None       Lajean Saver, MD 01/17/18 2137

## 2018-01-17 NOTE — ED Notes (Signed)
Patient aware that we need urine sample for testing, unable at this time. Pt given instruction on providing urine sample when able to do so.   

## 2018-01-17 NOTE — ED Triage Notes (Addendum)
Pt to ED from home stating he has run out of oxycodone two days ago which he takes for neuropathy and is withdrawing, feeling nervous. Pt's daughter accompanies him who states "it was getting to the point where he was thinking about hurting himself." Pt also states he has had decreased urinary output x1 month and would like to get that checked out.

## 2018-01-17 NOTE — ED Notes (Signed)
Pt daughter provided with visitor information, she took all of pt belongings home with her. Spoke with Dr. Delane Ginger since pt has not urinated a lot today, okay for bladder scan.

## 2018-01-17 NOTE — ED Triage Notes (Signed)
Pt also states that he had a plan to kill himself. Wants to stand in the middle of the floor and shoot himself with a pistol. Pt lives by himself and has access to a gun at his home.

## 2018-01-17 NOTE — ED Notes (Signed)
Staffing notified for sitter; no sitter available.

## 2018-01-18 DIAGNOSIS — F333 Major depressive disorder, recurrent, severe with psychotic symptoms: Secondary | ICD-10-CM | POA: Diagnosis present

## 2018-01-18 DIAGNOSIS — R45851 Suicidal ideations: Secondary | ICD-10-CM | POA: Diagnosis present

## 2018-01-18 DIAGNOSIS — I4891 Unspecified atrial fibrillation: Secondary | ICD-10-CM | POA: Diagnosis present

## 2018-01-18 DIAGNOSIS — F419 Anxiety disorder, unspecified: Secondary | ICD-10-CM | POA: Diagnosis not present

## 2018-01-18 DIAGNOSIS — F332 Major depressive disorder, recurrent severe without psychotic features: Secondary | ICD-10-CM | POA: Diagnosis not present

## 2018-01-18 DIAGNOSIS — J449 Chronic obstructive pulmonary disease, unspecified: Secondary | ICD-10-CM | POA: Diagnosis not present

## 2018-01-18 DIAGNOSIS — N183 Chronic kidney disease, stage 3 (moderate): Secondary | ICD-10-CM | POA: Diagnosis present

## 2018-01-18 DIAGNOSIS — F17210 Nicotine dependence, cigarettes, uncomplicated: Secondary | ICD-10-CM | POA: Diagnosis present

## 2018-01-18 DIAGNOSIS — I2510 Atherosclerotic heart disease of native coronary artery without angina pectoris: Secondary | ICD-10-CM | POA: Diagnosis present

## 2018-01-18 DIAGNOSIS — G8929 Other chronic pain: Secondary | ICD-10-CM | POA: Diagnosis present

## 2018-01-18 DIAGNOSIS — I129 Hypertensive chronic kidney disease with stage 1 through stage 4 chronic kidney disease, or unspecified chronic kidney disease: Secondary | ICD-10-CM | POA: Diagnosis not present

## 2018-01-18 DIAGNOSIS — G40909 Epilepsy, unspecified, not intractable, without status epilepticus: Secondary | ICD-10-CM | POA: Diagnosis present

## 2018-01-18 DIAGNOSIS — I252 Old myocardial infarction: Secondary | ICD-10-CM | POA: Diagnosis not present

## 2018-01-18 DIAGNOSIS — I10 Essential (primary) hypertension: Secondary | ICD-10-CM | POA: Diagnosis present

## 2018-01-18 DIAGNOSIS — I82409 Acute embolism and thrombosis of unspecified deep veins of unspecified lower extremity: Secondary | ICD-10-CM | POA: Diagnosis present

## 2018-01-18 DIAGNOSIS — Z8673 Personal history of transient ischemic attack (TIA), and cerebral infarction without residual deficits: Secondary | ICD-10-CM | POA: Diagnosis not present

## 2018-01-18 LAB — URINALYSIS, ROUTINE W REFLEX MICROSCOPIC
Bilirubin Urine: NEGATIVE
Glucose, UA: NEGATIVE mg/dL
Ketones, ur: NEGATIVE mg/dL
Nitrite: NEGATIVE
Protein, ur: 100 mg/dL — AB
RBC / HPF: >50 RBC/hpf — ABNORMAL HIGH (ref 0–5)
Specific Gravity, Urine: 1.028 (ref 1.005–1.030)
WBC, UA: >50 WBC/hpf — ABNORMAL HIGH (ref 0–5)
pH: 5 (ref 5.0–8.0)

## 2018-01-18 LAB — RAPID URINE DRUG SCREEN, HOSP PERFORMED
Amphetamines: NOT DETECTED
Barbiturates: NOT DETECTED
Benzodiazepines: POSITIVE — AB
Cocaine: NOT DETECTED
Opiates: POSITIVE — AB
Tetrahydrocannabinol: NOT DETECTED

## 2018-01-18 MED ORDER — IBUPROFEN 800 MG PO TABS
800.0000 mg | ORAL_TABLET | Freq: Once | ORAL | Status: AC
  Administered 2018-01-18: 800 mg via ORAL
  Filled 2018-01-18: qty 1

## 2018-01-18 NOTE — ED Notes (Signed)
Report given to Receiving RN, Andris Flurry at [facility]

## 2018-01-18 NOTE — ED Notes (Signed)
Pt discharged to [facility] with Betsy Pries

## 2018-01-18 NOTE — ED Notes (Addendum)
Daughter [name] made aware of pts transfer to [facility]. Daughter verified she took all belongings home with her.

## 2018-01-18 NOTE — Progress Notes (Signed)
Pt. meets criteria for inpatient treatment per Patriciaann Clan, PA.  Referred out to the following hospitals: [facility]  [facility]      Disposition CSW will continue to follow for placement.  Areatha Keas. Judi Cong, MSW, [place] Disposition Clinical Social Work [phone] (cell) [phone] (office)

## 2018-01-18 NOTE — Progress Notes (Addendum)
Pt accepted to [facility] Dr. Darden Palmer, MD is the accepting/attending provider.  Call report to [phone]  [facility] Psych ED notified.   Pt is Voluntary.  Pt may be transported by [facility]  Pt scheduled  to arrive at [facility] as soon as transport can be arranged.  PT TO BE BROUGHT TO [facility] INITIALLY FOR ADMISSION.  Areatha Keas. Judi Cong, MSW, [place] Disposition Clinical Social Work [phone] (cell) [phone] (office)

## 2018-01-18 NOTE — BH Assessment (Addendum)
Pt reports he is still in pain and and struggles with thoughts of not wanting to live. Pt has slurred speech and reports not sleeping well or eating well. Pt reports the bed is uncomfortable with his pain and does not have an appetite especially in the AM. Pt requested assistance with getting pain medication and this writer recommended discussing pain management options with his doctor there in the ED. Pt continues to meet inpatient criteria.

## 2018-01-24 ENCOUNTER — Ambulatory Visit: Payer: Self-pay | Admitting: Pharmacy Technician

## 2018-01-25 ENCOUNTER — Ambulatory Visit: Payer: Self-pay | Admitting: Pharmacy Technician

## 2018-01-25 ENCOUNTER — Other Ambulatory Visit: Payer: Self-pay | Admitting: Pharmacy Technician

## 2018-01-25 NOTE — Patient Outreach (Signed)
[facility]) Care Management  01/25/2018  [name] Sep 26, 1940 [id number]    Unsuccessful call placed to patients daughter, [name] regarding patient assistance application, HIPAA compliant voicemail left.  Will make 2nd call attempt in 2-3 business days if call has not been returned.  [name] [place] Certified Pharmacy Technician [place] Management Direct Dial:[phone]

## 2018-01-31 ENCOUNTER — Other Ambulatory Visit: Payer: Self-pay

## 2018-01-31 ENCOUNTER — Ambulatory Visit: Payer: Self-pay | Admitting: Pharmacy Technician

## 2018-01-31 DIAGNOSIS — R45851 Suicidal ideations: Secondary | ICD-10-CM | POA: Diagnosis not present

## 2018-01-31 DIAGNOSIS — M6281 Muscle weakness (generalized): Secondary | ICD-10-CM | POA: Diagnosis not present

## 2018-01-31 DIAGNOSIS — F329 Major depressive disorder, single episode, unspecified: Secondary | ICD-10-CM | POA: Diagnosis not present

## 2018-01-31 DIAGNOSIS — F3341 Major depressive disorder, recurrent, in partial remission: Secondary | ICD-10-CM | POA: Diagnosis not present

## 2018-01-31 DIAGNOSIS — Z79891 Long term (current) use of opiate analgesic: Secondary | ICD-10-CM | POA: Diagnosis not present

## 2018-01-31 DIAGNOSIS — Z659 Problem related to unspecified psychosocial circumstances: Secondary | ICD-10-CM | POA: Diagnosis not present

## 2018-01-31 DIAGNOSIS — M79609 Pain in unspecified limb: Secondary | ICD-10-CM | POA: Diagnosis not present

## 2018-01-31 NOTE — Patient Outreach (Signed)
[facility]) Care Management  01/31/2018  [name] 1940-07-19 [id number]   Telephone Screen  Referral Date:01/31/18 Referral Source:MD office-Urgent Referral Reason: " chronic back pain, failure to thrive, depression" Insurance: Medicare  Incoming call from patient's daughter-[name]. Screening completed.  Social: Patient resides in his home alone. Daughter states that she lives an hour away and is only able to go check on patient a few times a week and she works as well. She reports that patient recently got his neighbor(Sara) to start coming over a few times a week to assist him with personal care and safety. Daughter interested in possible other in home support/assistance services that may be available to them. She states that she was told that patient should not be left alone but feels like he is okay to be left alone. She reports that neighbor has volunteered to spend some nights with patient but this has not started yet. Daughter states that a PACE referral was done on patient. However, when staff called to discuss services with patient-he declined. Daughter voices that she called them back but was advised that she should speak with MD to determine if patient appropriate or not. She reports that he requires assistance with ADLS/IADLs. Daughter voices no recent falls since patient obtained a [employer]. She takes off work to take patient to appts.   Conditions: Per chart review, patient has PMH of anxiety,depression,HTN, A-Fib, CKD stage 3, CAD, CVA with slurred speech, PVD, neuropathy and chronic pain. Daughter voices that patient was in the ED on 01/17/18 for suicidal ideation. She states that patient "either took extra pain meds and/or gave some to his neighbors" and ran out of pain med. He was in so much pain that he wanted to "shoot and kill himself." She denies patient having any other further thoughts at this time. She voices that he was transferred from ED to  behavioral health and stayed there a week. Per daughter, patient was discharged on 01/25/18 and voices no changes to his plan of care made. She reports the only thing they wanted to do differently was refer patient to psychiatrist in [place]. She voices that patient is active already with a psychiatrist.   Medications: Per daughter, patient taking about 10 meds. She voices that since recent hospital stay patient is no longer managing his meds on his own. His neighbor is administering meds to patient directly from pill bottle. Patient is currently active with [facility] pharmacy for possible med assistance.    Advance Directives: Daughter states that she is patient's financial POA. She reports that they never completed [facility] POA form but feels it would be beneficial.  Consent: [facility] services reviewed and discussed with daughter. Verbal consent for services given. Daughter feels that she only further needs SW assistance to assist with in home support at this time.   Plan: RN CM will send [facility] SW referral for possible assistance with in home support resources/services, Medicaid and [facility] POA forms assistance.  Enzo Montgomery, RN,BSN,CCM [place] Management Telephonic Care Management Coordinator Direct Phone: [phone] Toll Free: [phone] Fax: [phone]

## 2018-01-31 NOTE — Patient Outreach (Signed)
[facility]) Care Management  01/31/2018  [name] 10-04-1940 [id number]   Telephone Screen  Referral Date:01/31/18 Referral Source:MD office-Urgent Referral Reason: " chronic back pain, failure to thrive, depression" Insurance: Medicare   Outreach attempt # 1 to patient. Spoke with patient. He requested that call be completed with his daughter-[name] who was in the home. Spoke with daughter who reports that she is getting ready to "head back to work." She requested RN CM contact info and voiced she would call RN CM back later.      Plan: RN CM will make make outreach attempt to pt/daughter within 3-4 business days if no return call.    Enzo Montgomery, RN,BSN,CCM [place] Management Telephonic Care Management Coordinator Direct Phone: [phone] Toll Free: [phone] Fax: [phone]

## 2018-02-01 ENCOUNTER — Ambulatory Visit: Payer: Self-pay | Admitting: Pharmacy Technician

## 2018-02-01 ENCOUNTER — Other Ambulatory Visit: Payer: Self-pay | Admitting: Pharmacy Technician

## 2018-02-01 NOTE — Patient Outreach (Signed)
[facility]) Care Management  02/01/2018  [name] March 17, 1940 [id number]   2nd Call Unsuccessful call placed to patient's daughter, [name] regarding patient assistance application, unable to leave voicemail.   Will make 3rd call attempt in 2-3 business days.  [name] Certified Pharmacy Technician [place] Management Direct Dial:[phone]

## 2018-02-03 ENCOUNTER — Ambulatory Visit: Payer: Self-pay | Admitting: Pharmacy Technician

## 2018-02-07 ENCOUNTER — Other Ambulatory Visit: Payer: Self-pay | Admitting: Pharmacy Technician

## 2018-02-07 ENCOUNTER — Other Ambulatory Visit: Payer: Self-pay

## 2018-02-07 ENCOUNTER — Ambulatory Visit: Payer: Self-pay | Admitting: Pharmacy Technician

## 2018-02-07 NOTE — Patient Outreach (Addendum)
[place]) Care Management  02/07/2018  [name] 04-04-1940 [id number]   Call attempt #3 Unsuccessful call placed to patients daughter, [name] regarding patient assistance application for [employer], HIPAA compliant voicemail left.  Will remove myself from Care Team due to not being able to engage patient farther with application process.  [name] [place] Certified Pharmacy Technician [place] Management Direct [phone]   ADDENDUM 1:29pm   Successful call placed to patients daughter, [name] regarding patient assistance application, HIPAA identifiers verified. Return call to [facility] after receiving voicemail. [name] confirmed that she had received the assistance application for [employer] that was mailed and she stated she would send back in. Also provided [name] with [facility] fax number in case faxing documents would be easier.  Will submit to [facility] once all documents have been received.  [name] [place] Certified Pharmacy Technician [place] Management Direct [phone]

## 2018-02-07 NOTE — Patient Outreach (Signed)
[place] [facility]) Care Management  02/07/2018  [name] 03-17-1940 [id number]  BSW placed a successful outreach call to the patients daughter [name] to discuss the patients referral to [place] [facility] for assistance with caregiver resources. Patient HIPAA identifiers confirmed. [name] reports the patient lives alone with limited assistance from her due to distance between homes. [name] resides in [place] and is approximately 1 hour from the patients home.   The patient's neighbor and friends are actively involved in providing assistance to meet the patients needs. [name] reports the patients neighbor [name] has been assisting with obtaining groceries for the patient and meal preparation. Since the patients recent hospitalization, [name] has begun monitoring medication administration, assists with ADL's and iADL's as needed, and has agreed to stay overnight when needed. [name] assists with transportation needs to physician appointments.  [name] reports the patient is in need of assistance but does not acknowledge this based on received assistance from his neighbor. It is noted the patient was recently referred to PACE. However, the patient informed PACE he was not in need of assistance during a screening call. [name] is interested in identifying programs to assist the patient in the even his neighbor is no longer available to provide such assistance. The patient is not a Medicaid recipient. The patients income is well over the defined poverty line. BSW explained that although the patient does not receive Medicaid he may still qualify for PACE but would have to pay an out of pocket expense. BSW discussed PACE services with [name] and explained what the monthly payment would cover. After discussing the services PACE has to offer [name] does not feel this service would benefit her father due to cost and what her father is interested in.  BSW spoke with [name] about [facility] caregiver resources provided  by Aging, Disability, and [employer] (ADTS). [name] is in agreement with BSW placing the patient on the wait list for an in home caregiver provided by a community block grant. BSW also educated [name] on the option to privately hire a caregiver from ADTS at $10.00 per hour in the event [name] is unavailable. [name] is in agreement this may be an option to assist under certain circumstances and is in agreement of this BSW mailing her information on this program. BSW placed an outreach call to ADTS and left a voice message for [name] in efforts to place the patient on the block grant waiting list.  Prior to ending today's call, BSW discussed [place] Management past involvement with the patient. BSW inquired if the patient had began receiving meals on wheels considering a referral to the program had been placed by [facility] BSW Amber Chrismon in July. [name] denied knowledge of patient receiving meals. BSW placed an outreach call to [name] and left a voice message requesting a return call to follow up on patients wait list status.  BSW spoke with [name] about advance directives. [name] states she is currently her fathers financial power of attorney but he is not willing to give her the right to make medical decisions for him at this time. BSW educated [name] on advance directives and explained that she would not be given this authority unless a circumstance occurred which prohibited the patient from being able to make medical decisions. [name] thanked this BSW for the education and requested BSW mail an advance directive packet to her home for her father to review.  Plan: BSW will continue to follow and assist with referral to ADTS  for caregiver resources as well as meals on wheels. BSW to outreach [name] in the next three weeks to confirm receipt of mailed resources.  Daneen Schick, BSW, CDP [facility] [phone]

## 2018-02-08 ENCOUNTER — Other Ambulatory Visit: Payer: Self-pay | Admitting: Pharmacy Technician

## 2018-02-08 ENCOUNTER — Other Ambulatory Visit: Payer: Self-pay

## 2018-02-08 NOTE — Patient Outreach (Signed)
[facility]) Care Management  02/08/2018  [name] December 06, 1940 [id number]   Received patient portion of patient assistance application for [employer]. Faxed completed application and required documents into [place].  Will follow up with company in 2-3 business days to check status of application.  [name] [place] Certified Pharmacy Technician [place] Management Direct [phone]

## 2018-02-08 NOTE — Patient Outreach (Signed)
[facility]) Care Management  02/08/2018  [name] June 08, 1940 [id number]  BSW received an incoming call from Bonnetta Barry with [employer] on wheels program. [name] is able to confirm the patient is still currently on the waiting list to receive meals. At this time, the patient is number [phone] in line. BSW provided Manus Gunning with the patients daughters contact information. BSW requested the program contact the patients daughter when needing to schedule intake as the patient often declines services. Manus Gunning stated understanding and confirmed she has updated the patients referral with this information.  Daneen Schick, BSW, CDP [facility] [phone]

## 2018-02-10 ENCOUNTER — Other Ambulatory Visit: Payer: Self-pay

## 2018-02-10 NOTE — Patient Outreach (Signed)
[facility]) Care Management  02/10/2018  [name] [birth year] [id number]  BSW successfully placed patient on the waiting list for a caregiver provided by [employer] block grant program. BSW was informed when the patients name becomes at the top of the list, an [employer] staff member would contact the patient to arrange an in-home assessment. BSW provided [employer] with contact information to [name] (patients daughter). BSW was informed this program only has funding for 12 participants at a time and a lengthy waiting list. Estimated wait list is approximately 3 years.  BSW will communicate referral of block grant program and meals on wheels status to the patients daughter during our next scheduled outreach call.  Daneen Schick, BSW, CDP [facility] [phone]

## 2018-02-14 ENCOUNTER — Other Ambulatory Visit: Payer: Self-pay | Admitting: Pharmacy Technician

## 2018-02-14 NOTE — Patient Outreach (Signed)
[facility]) Care Management  02/14/2018  [name] 11-30-1940 [id number]    Successful call placed to patients daughter, [name] regarding patient assistance update for Amit[employer], HIPAA identifiers verified. Informed [name] that [name] will no longer accept bank statements as proof of income. [name] will try to locate [facility] awards letter, 1040 or 1099 to send into me.  Will follow up 7-10 business days if I have not received an updated document.  [name] [place] Certified Pharmacy Technician [place] Management Direct Dial:[phone]

## 2018-02-21 DIAGNOSIS — F419 Anxiety disorder, unspecified: Secondary | ICD-10-CM | POA: Diagnosis present

## 2018-02-21 DIAGNOSIS — Z7401 Bed confinement status: Secondary | ICD-10-CM | POA: Diagnosis not present

## 2018-02-21 DIAGNOSIS — I130 Hypertensive heart and chronic kidney disease with heart failure and stage 1 through stage 4 chronic kidney disease, or unspecified chronic kidney disease: Secondary | ICD-10-CM | POA: Diagnosis not present

## 2018-02-21 DIAGNOSIS — R109 Unspecified abdominal pain: Secondary | ICD-10-CM | POA: Diagnosis not present

## 2018-02-21 DIAGNOSIS — N183 Chronic kidney disease, stage 3 (moderate): Secondary | ICD-10-CM | POA: Diagnosis present

## 2018-02-21 DIAGNOSIS — N179 Acute kidney failure, unspecified: Secondary | ICD-10-CM | POA: Diagnosis not present

## 2018-02-21 DIAGNOSIS — R262 Difficulty in walking, not elsewhere classified: Secondary | ICD-10-CM | POA: Diagnosis not present

## 2018-02-21 DIAGNOSIS — I252 Old myocardial infarction: Secondary | ICD-10-CM | POA: Diagnosis not present

## 2018-02-21 DIAGNOSIS — G894 Chronic pain syndrome: Secondary | ICD-10-CM | POA: Diagnosis present

## 2018-02-21 DIAGNOSIS — I10 Essential (primary) hypertension: Secondary | ICD-10-CM | POA: Diagnosis not present

## 2018-02-21 DIAGNOSIS — R4189 Other symptoms and signs involving cognitive functions and awareness: Secondary | ICD-10-CM | POA: Diagnosis not present

## 2018-02-21 DIAGNOSIS — E785 Hyperlipidemia, unspecified: Secondary | ICD-10-CM | POA: Diagnosis not present

## 2018-02-21 DIAGNOSIS — Z79899 Other long term (current) drug therapy: Secondary | ICD-10-CM | POA: Diagnosis not present

## 2018-02-21 DIAGNOSIS — E790 Hyperuricemia without signs of inflammatory arthritis and tophaceous disease: Secondary | ICD-10-CM | POA: Diagnosis not present

## 2018-02-21 DIAGNOSIS — N390 Urinary tract infection, site not specified: Secondary | ICD-10-CM | POA: Diagnosis not present

## 2018-02-21 DIAGNOSIS — N189 Chronic kidney disease, unspecified: Secondary | ICD-10-CM | POA: Diagnosis not present

## 2018-02-21 DIAGNOSIS — M109 Gout, unspecified: Secondary | ICD-10-CM | POA: Diagnosis not present

## 2018-02-21 DIAGNOSIS — E876 Hypokalemia: Secondary | ICD-10-CM | POA: Diagnosis not present

## 2018-02-21 DIAGNOSIS — I69392 Facial weakness following cerebral infarction: Secondary | ICD-10-CM | POA: Diagnosis not present

## 2018-02-21 DIAGNOSIS — F17200 Nicotine dependence, unspecified, uncomplicated: Secondary | ICD-10-CM | POA: Diagnosis not present

## 2018-02-21 DIAGNOSIS — Z8673 Personal history of transient ischemic attack (TIA), and cerebral infarction without residual deficits: Secondary | ICD-10-CM | POA: Diagnosis not present

## 2018-02-21 DIAGNOSIS — E440 Moderate protein-calorie malnutrition: Secondary | ICD-10-CM | POA: Diagnosis not present

## 2018-02-21 DIAGNOSIS — N400 Enlarged prostate without lower urinary tract symptoms: Secondary | ICD-10-CM | POA: Diagnosis not present

## 2018-02-21 DIAGNOSIS — R4182 Altered mental status, unspecified: Secondary | ICD-10-CM | POA: Diagnosis not present

## 2018-02-21 DIAGNOSIS — I482 Chronic atrial fibrillation, unspecified: Secondary | ICD-10-CM | POA: Diagnosis not present

## 2018-02-21 DIAGNOSIS — I4891 Unspecified atrial fibrillation: Secondary | ICD-10-CM | POA: Diagnosis not present

## 2018-02-21 DIAGNOSIS — M6281 Muscle weakness (generalized): Secondary | ICD-10-CM | POA: Diagnosis not present

## 2018-02-21 DIAGNOSIS — R531 Weakness: Secondary | ICD-10-CM | POA: Diagnosis not present

## 2018-02-21 DIAGNOSIS — R41841 Cognitive communication deficit: Secondary | ICD-10-CM | POA: Diagnosis not present

## 2018-02-21 DIAGNOSIS — Z1612 Extended spectrum beta lactamase (ESBL) resistance: Secondary | ICD-10-CM | POA: Diagnosis not present

## 2018-02-21 DIAGNOSIS — F418 Other specified anxiety disorders: Secondary | ICD-10-CM | POA: Diagnosis not present

## 2018-02-21 DIAGNOSIS — B961 Klebsiella pneumoniae [K. pneumoniae] as the cause of diseases classified elsewhere: Secondary | ICD-10-CM | POA: Diagnosis not present

## 2018-02-21 DIAGNOSIS — I509 Heart failure, unspecified: Secondary | ICD-10-CM | POA: Diagnosis not present

## 2018-02-21 DIAGNOSIS — B9620 Unspecified Escherichia coli [E. coli] as the cause of diseases classified elsewhere: Secondary | ICD-10-CM | POA: Diagnosis not present

## 2018-02-21 DIAGNOSIS — G8929 Other chronic pain: Secondary | ICD-10-CM | POA: Diagnosis not present

## 2018-02-21 DIAGNOSIS — I69391 Dysphagia following cerebral infarction: Secondary | ICD-10-CM | POA: Diagnosis not present

## 2018-02-21 DIAGNOSIS — I959 Hypotension, unspecified: Secondary | ICD-10-CM | POA: Diagnosis not present

## 2018-02-21 DIAGNOSIS — G9341 Metabolic encephalopathy: Secondary | ICD-10-CM | POA: Diagnosis not present

## 2018-02-21 DIAGNOSIS — F339 Major depressive disorder, recurrent, unspecified: Secondary | ICD-10-CM | POA: Diagnosis not present

## 2018-02-21 DIAGNOSIS — I2510 Atherosclerotic heart disease of native coronary artery without angina pectoris: Secondary | ICD-10-CM | POA: Diagnosis not present

## 2018-02-21 DIAGNOSIS — R296 Repeated falls: Secondary | ICD-10-CM | POA: Diagnosis not present

## 2018-02-21 DIAGNOSIS — Z66 Do not resuscitate: Secondary | ICD-10-CM | POA: Diagnosis present

## 2018-02-21 DIAGNOSIS — J42 Unspecified chronic bronchitis: Secondary | ICD-10-CM | POA: Diagnosis not present

## 2018-02-21 DIAGNOSIS — M2550 Pain in unspecified joint: Secondary | ICD-10-CM | POA: Diagnosis not present

## 2018-02-21 DIAGNOSIS — R5381 Other malaise: Secondary | ICD-10-CM | POA: Diagnosis present

## 2018-02-21 DIAGNOSIS — K5900 Constipation, unspecified: Secondary | ICD-10-CM | POA: Diagnosis present

## 2018-03-03 ENCOUNTER — Other Ambulatory Visit: Payer: Self-pay

## 2018-03-03 NOTE — Patient Outreach (Signed)
[facility]) Care Management  03/03/2018  DARICK FETTERS 05/05/1940 [id number]  Successful outreach to the patients daughter [name], patient HIPAA identifiers confirmed. [name] is able to confirm receipt of mailed resources. Unfortunately, the patient has been inpatient at [facility] since December 30th. [name] reports the patient had experienced two falls and was hard to redirect. The patient was admitted due to a kidney infection. It is reported the patient will be discharged to a SNF for rehab. The patients discharge date is not known at this time. [name] reports the patient wants to return home but she is not in agreement and wants him to attend a SNF for rehab. She further states "I don't think he will ever go home".  BSW spoke with [name] about a discipline closure considering the patients current disposition. [name] is in agreement with this and understands she will remain active with pharmacy team regarding patient assistance. [name] states she has yet to get needed documents to [place] with [place] Management because she is unsure if it is needed. BSW acknowledged this and explained that this BSW would update Caryl Pina with the patients current condition. BSW further stated BSW would ask Caryl Pina to contact [name] in the next week to follow up on patient needs regarding medication assistance. [name] is in agreement with this plan.  Prior to ending today's call, BSW did communicate to [name] the patient had been placed on the community block grant caregiver wait list as well as the mobile meals wait list. [name] understands both wait lists are lengthy and is aware she is listed as the main contact for when the patient is eligible to receive services.   Daneen Schick, BSW, CDP [facility] [phone]

## 2018-03-09 DIAGNOSIS — M2041 Other hammer toe(s) (acquired), right foot: Secondary | ICD-10-CM | POA: Diagnosis not present

## 2018-03-09 DIAGNOSIS — N189 Chronic kidney disease, unspecified: Secondary | ICD-10-CM | POA: Diagnosis not present

## 2018-03-09 DIAGNOSIS — F339 Major depressive disorder, recurrent, unspecified: Secondary | ICD-10-CM | POA: Diagnosis not present

## 2018-03-09 DIAGNOSIS — I959 Hypotension, unspecified: Secondary | ICD-10-CM | POA: Diagnosis not present

## 2018-03-09 DIAGNOSIS — I69391 Dysphagia following cerebral infarction: Secondary | ICD-10-CM | POA: Diagnosis not present

## 2018-03-09 DIAGNOSIS — B961 Klebsiella pneumoniae [K. pneumoniae] as the cause of diseases classified elsewhere: Secondary | ICD-10-CM | POA: Diagnosis not present

## 2018-03-09 DIAGNOSIS — Z1612 Extended spectrum beta lactamase (ESBL) resistance: Secondary | ICD-10-CM | POA: Diagnosis not present

## 2018-03-09 DIAGNOSIS — E790 Hyperuricemia without signs of inflammatory arthritis and tophaceous disease: Secondary | ICD-10-CM | POA: Diagnosis not present

## 2018-03-09 DIAGNOSIS — R41841 Cognitive communication deficit: Secondary | ICD-10-CM | POA: Diagnosis not present

## 2018-03-09 DIAGNOSIS — N390 Urinary tract infection, site not specified: Secondary | ICD-10-CM | POA: Diagnosis not present

## 2018-03-09 DIAGNOSIS — R296 Repeated falls: Secondary | ICD-10-CM | POA: Diagnosis not present

## 2018-03-09 DIAGNOSIS — Z7401 Bed confinement status: Secondary | ICD-10-CM | POA: Diagnosis not present

## 2018-03-09 DIAGNOSIS — I69392 Facial weakness following cerebral infarction: Secondary | ICD-10-CM | POA: Diagnosis not present

## 2018-03-09 DIAGNOSIS — I4891 Unspecified atrial fibrillation: Secondary | ICD-10-CM | POA: Diagnosis not present

## 2018-03-09 DIAGNOSIS — G9341 Metabolic encephalopathy: Secondary | ICD-10-CM | POA: Diagnosis not present

## 2018-03-09 DIAGNOSIS — E876 Hypokalemia: Secondary | ICD-10-CM | POA: Diagnosis not present

## 2018-03-09 DIAGNOSIS — B351 Tinea unguium: Secondary | ICD-10-CM | POA: Diagnosis not present

## 2018-03-09 DIAGNOSIS — R600 Localized edema: Secondary | ICD-10-CM | POA: Diagnosis not present

## 2018-03-09 DIAGNOSIS — N179 Acute kidney failure, unspecified: Secondary | ICD-10-CM | POA: Diagnosis not present

## 2018-03-09 DIAGNOSIS — L0390 Cellulitis, unspecified: Secondary | ICD-10-CM | POA: Diagnosis not present

## 2018-03-09 DIAGNOSIS — R609 Edema, unspecified: Secondary | ICD-10-CM | POA: Diagnosis not present

## 2018-03-09 DIAGNOSIS — F419 Anxiety disorder, unspecified: Secondary | ICD-10-CM | POA: Diagnosis not present

## 2018-03-09 DIAGNOSIS — M2550 Pain in unspecified joint: Secondary | ICD-10-CM | POA: Diagnosis not present

## 2018-03-09 DIAGNOSIS — M6281 Muscle weakness (generalized): Secondary | ICD-10-CM | POA: Diagnosis not present

## 2018-03-09 DIAGNOSIS — R262 Difficulty in walking, not elsewhere classified: Secondary | ICD-10-CM | POA: Diagnosis not present

## 2018-03-09 DIAGNOSIS — E440 Moderate protein-calorie malnutrition: Secondary | ICD-10-CM | POA: Diagnosis not present

## 2018-03-09 DIAGNOSIS — I509 Heart failure, unspecified: Secondary | ICD-10-CM | POA: Diagnosis not present

## 2018-03-23 DIAGNOSIS — N189 Chronic kidney disease, unspecified: Secondary | ICD-10-CM | POA: Diagnosis not present

## 2018-03-23 DIAGNOSIS — B351 Tinea unguium: Secondary | ICD-10-CM | POA: Diagnosis not present

## 2018-03-23 DIAGNOSIS — M2041 Other hammer toe(s) (acquired), right foot: Secondary | ICD-10-CM | POA: Diagnosis not present

## 2018-03-28 DIAGNOSIS — R609 Edema, unspecified: Secondary | ICD-10-CM | POA: Diagnosis not present

## 2018-03-28 DIAGNOSIS — L0390 Cellulitis, unspecified: Secondary | ICD-10-CM | POA: Diagnosis not present

## 2018-03-28 DIAGNOSIS — G9341 Metabolic encephalopathy: Secondary | ICD-10-CM | POA: Diagnosis not present

## 2018-03-28 DIAGNOSIS — N189 Chronic kidney disease, unspecified: Secondary | ICD-10-CM | POA: Diagnosis not present

## 2018-03-29 ENCOUNTER — Other Ambulatory Visit: Payer: Self-pay | Admitting: Pharmacy Technician

## 2018-03-29 ENCOUNTER — Other Ambulatory Visit: Payer: Self-pay | Admitting: *Deleted

## 2018-03-29 NOTE — Patient Outreach (Signed)
[facility]) Care Management  03/29/2018  [name] 1941-02-12 [id number]   Notified by [facility] Nurse that patient will discharge on 03/31/2018.   Call to patient daughter, [name] to check on patient discharging home or if he is remaining LTC. [name] states the facility performed a home visit with her and patient and that patient is not able to independently toilet. This will prevent a safe discharge at this time. She reports she has spoken to facility and applied for Medicaid. She states patient is too weak to go home at this time, the hope is that he may continue to improve and go home at a later time.  She reports patient has a lot of neuropathy and pain and even some depression. She feels safe plan is to remain at facility. She states she works and lives an hour away and patient lives alone. He does have some supportive neighbors and friends but patient needs more assistance than they can give at this time.   RNCM discussed that maybe he could could continue to work with therapy some under the Medicaid and to consider additional services such as PACE when patient may be able to discharge.  RNCM gave daughter [facility] contact.   Plan to sign off case and will let [facility] care team know of patient discharge plan to remain LTC at this time.   Royetta Crochet. Laymond Purser, MSN, RN, [employer] , [place] [phone]) Business Cell  [phone]) Toll Free Office

## 2018-03-29 NOTE — Patient Outreach (Signed)
[facility]) Care Management  03/29/2018  [name] Apr 23, 1940 [id number]   Received in basket message from [place]. Niemczura, MSN, RN, AGNP-C, CCM,  [facility] Care Coordinator stating patient will become resident at [facility].  Had been informed previously by [facility] Daneen Schick that patient daughter would contact me if she wanted to continue the patient assistance process but was currently unsure due to fathers hospitalization.   Will remove myself from care team now that patient will no longer need the patient assistance process continued for Amitiza.  [name] [place] Certified Pharmacy Technician [place] Management Direct Dial:[phone]

## 2018-04-01 DIAGNOSIS — R262 Difficulty in walking, not elsewhere classified: Secondary | ICD-10-CM | POA: Diagnosis not present

## 2018-04-01 DIAGNOSIS — G9341 Metabolic encephalopathy: Secondary | ICD-10-CM | POA: Diagnosis not present

## 2018-04-01 DIAGNOSIS — I69392 Facial weakness following cerebral infarction: Secondary | ICD-10-CM | POA: Diagnosis not present

## 2018-04-01 DIAGNOSIS — I69391 Dysphagia following cerebral infarction: Secondary | ICD-10-CM | POA: Diagnosis not present

## 2018-04-01 DIAGNOSIS — M6281 Muscle weakness (generalized): Secondary | ICD-10-CM | POA: Diagnosis not present

## 2018-04-01 DIAGNOSIS — R41841 Cognitive communication deficit: Secondary | ICD-10-CM | POA: Diagnosis not present

## 2018-04-04 DIAGNOSIS — L539 Erythematous condition, unspecified: Secondary | ICD-10-CM | POA: Diagnosis not present

## 2018-04-04 DIAGNOSIS — G9341 Metabolic encephalopathy: Secondary | ICD-10-CM | POA: Diagnosis not present

## 2018-04-04 DIAGNOSIS — R41841 Cognitive communication deficit: Secondary | ICD-10-CM | POA: Diagnosis not present

## 2018-04-04 DIAGNOSIS — R0989 Other specified symptoms and signs involving the circulatory and respiratory systems: Secondary | ICD-10-CM | POA: Diagnosis not present

## 2018-04-04 DIAGNOSIS — R262 Difficulty in walking, not elsewhere classified: Secondary | ICD-10-CM | POA: Diagnosis not present

## 2018-04-04 DIAGNOSIS — I69391 Dysphagia following cerebral infarction: Secondary | ICD-10-CM | POA: Diagnosis not present

## 2018-04-04 DIAGNOSIS — M6281 Muscle weakness (generalized): Secondary | ICD-10-CM | POA: Diagnosis not present

## 2018-04-04 DIAGNOSIS — M79605 Pain in left leg: Secondary | ICD-10-CM | POA: Diagnosis not present

## 2018-04-04 DIAGNOSIS — I69392 Facial weakness following cerebral infarction: Secondary | ICD-10-CM | POA: Diagnosis not present

## 2018-04-04 DIAGNOSIS — M79604 Pain in right leg: Secondary | ICD-10-CM | POA: Diagnosis not present

## 2018-04-05 DIAGNOSIS — R41841 Cognitive communication deficit: Secondary | ICD-10-CM | POA: Diagnosis not present

## 2018-04-05 DIAGNOSIS — I739 Peripheral vascular disease, unspecified: Secondary | ICD-10-CM | POA: Diagnosis not present

## 2018-04-05 DIAGNOSIS — G9341 Metabolic encephalopathy: Secondary | ICD-10-CM | POA: Diagnosis not present

## 2018-04-05 DIAGNOSIS — I69392 Facial weakness following cerebral infarction: Secondary | ICD-10-CM | POA: Diagnosis not present

## 2018-04-05 DIAGNOSIS — I69391 Dysphagia following cerebral infarction: Secondary | ICD-10-CM | POA: Diagnosis not present

## 2018-04-05 DIAGNOSIS — R262 Difficulty in walking, not elsewhere classified: Secondary | ICD-10-CM | POA: Diagnosis not present

## 2018-04-05 DIAGNOSIS — M6281 Muscle weakness (generalized): Secondary | ICD-10-CM | POA: Diagnosis not present

## 2018-04-06 DIAGNOSIS — R262 Difficulty in walking, not elsewhere classified: Secondary | ICD-10-CM | POA: Diagnosis not present

## 2018-04-06 DIAGNOSIS — R41841 Cognitive communication deficit: Secondary | ICD-10-CM | POA: Diagnosis not present

## 2018-04-06 DIAGNOSIS — I69391 Dysphagia following cerebral infarction: Secondary | ICD-10-CM | POA: Diagnosis not present

## 2018-04-06 DIAGNOSIS — G9341 Metabolic encephalopathy: Secondary | ICD-10-CM | POA: Diagnosis not present

## 2018-04-06 DIAGNOSIS — I69392 Facial weakness following cerebral infarction: Secondary | ICD-10-CM | POA: Diagnosis not present

## 2018-04-06 DIAGNOSIS — M6281 Muscle weakness (generalized): Secondary | ICD-10-CM | POA: Diagnosis not present

## 2018-04-07 DIAGNOSIS — I69391 Dysphagia following cerebral infarction: Secondary | ICD-10-CM | POA: Diagnosis not present

## 2018-04-07 DIAGNOSIS — R41841 Cognitive communication deficit: Secondary | ICD-10-CM | POA: Diagnosis not present

## 2018-04-07 DIAGNOSIS — I69392 Facial weakness following cerebral infarction: Secondary | ICD-10-CM | POA: Diagnosis not present

## 2018-04-07 DIAGNOSIS — R262 Difficulty in walking, not elsewhere classified: Secondary | ICD-10-CM | POA: Diagnosis not present

## 2018-04-07 DIAGNOSIS — G9341 Metabolic encephalopathy: Secondary | ICD-10-CM | POA: Diagnosis not present

## 2018-04-07 DIAGNOSIS — M6281 Muscle weakness (generalized): Secondary | ICD-10-CM | POA: Diagnosis not present

## 2018-04-08 DIAGNOSIS — G9341 Metabolic encephalopathy: Secondary | ICD-10-CM | POA: Diagnosis not present

## 2018-04-08 DIAGNOSIS — R41841 Cognitive communication deficit: Secondary | ICD-10-CM | POA: Diagnosis not present

## 2018-04-08 DIAGNOSIS — R262 Difficulty in walking, not elsewhere classified: Secondary | ICD-10-CM | POA: Diagnosis not present

## 2018-04-08 DIAGNOSIS — M6281 Muscle weakness (generalized): Secondary | ICD-10-CM | POA: Diagnosis not present

## 2018-04-08 DIAGNOSIS — I69391 Dysphagia following cerebral infarction: Secondary | ICD-10-CM | POA: Diagnosis not present

## 2018-04-08 DIAGNOSIS — I69392 Facial weakness following cerebral infarction: Secondary | ICD-10-CM | POA: Diagnosis not present

## 2018-04-11 DIAGNOSIS — M6281 Muscle weakness (generalized): Secondary | ICD-10-CM | POA: Diagnosis not present

## 2018-04-11 DIAGNOSIS — I69391 Dysphagia following cerebral infarction: Secondary | ICD-10-CM | POA: Diagnosis not present

## 2018-04-11 DIAGNOSIS — R262 Difficulty in walking, not elsewhere classified: Secondary | ICD-10-CM | POA: Diagnosis not present

## 2018-04-11 DIAGNOSIS — R41841 Cognitive communication deficit: Secondary | ICD-10-CM | POA: Diagnosis not present

## 2018-04-11 DIAGNOSIS — G9341 Metabolic encephalopathy: Secondary | ICD-10-CM | POA: Diagnosis not present

## 2018-04-11 DIAGNOSIS — I69392 Facial weakness following cerebral infarction: Secondary | ICD-10-CM | POA: Diagnosis not present

## 2018-04-12 DIAGNOSIS — M6281 Muscle weakness (generalized): Secondary | ICD-10-CM | POA: Diagnosis not present

## 2018-04-12 DIAGNOSIS — I69392 Facial weakness following cerebral infarction: Secondary | ICD-10-CM | POA: Diagnosis not present

## 2018-04-12 DIAGNOSIS — I69391 Dysphagia following cerebral infarction: Secondary | ICD-10-CM | POA: Diagnosis not present

## 2018-04-12 DIAGNOSIS — R41841 Cognitive communication deficit: Secondary | ICD-10-CM | POA: Diagnosis not present

## 2018-04-12 DIAGNOSIS — G9341 Metabolic encephalopathy: Secondary | ICD-10-CM | POA: Diagnosis not present

## 2018-04-12 DIAGNOSIS — R262 Difficulty in walking, not elsewhere classified: Secondary | ICD-10-CM | POA: Diagnosis not present

## 2018-04-13 DIAGNOSIS — L853 Xerosis cutis: Secondary | ICD-10-CM | POA: Diagnosis not present

## 2018-04-13 DIAGNOSIS — R41841 Cognitive communication deficit: Secondary | ICD-10-CM | POA: Diagnosis not present

## 2018-04-13 DIAGNOSIS — M6281 Muscle weakness (generalized): Secondary | ICD-10-CM | POA: Diagnosis not present

## 2018-04-13 DIAGNOSIS — G9341 Metabolic encephalopathy: Secondary | ICD-10-CM | POA: Diagnosis not present

## 2018-04-13 DIAGNOSIS — I69392 Facial weakness following cerebral infarction: Secondary | ICD-10-CM | POA: Diagnosis not present

## 2018-04-13 DIAGNOSIS — N189 Chronic kidney disease, unspecified: Secondary | ICD-10-CM | POA: Diagnosis not present

## 2018-04-13 DIAGNOSIS — I69391 Dysphagia following cerebral infarction: Secondary | ICD-10-CM | POA: Diagnosis not present

## 2018-04-13 DIAGNOSIS — I10 Essential (primary) hypertension: Secondary | ICD-10-CM | POA: Diagnosis not present

## 2018-04-13 DIAGNOSIS — I509 Heart failure, unspecified: Secondary | ICD-10-CM | POA: Diagnosis not present

## 2018-04-13 DIAGNOSIS — R262 Difficulty in walking, not elsewhere classified: Secondary | ICD-10-CM | POA: Diagnosis not present

## 2018-04-14 DIAGNOSIS — G9341 Metabolic encephalopathy: Secondary | ICD-10-CM | POA: Diagnosis not present

## 2018-04-14 DIAGNOSIS — I69391 Dysphagia following cerebral infarction: Secondary | ICD-10-CM | POA: Diagnosis not present

## 2018-04-14 DIAGNOSIS — M6281 Muscle weakness (generalized): Secondary | ICD-10-CM | POA: Diagnosis not present

## 2018-04-14 DIAGNOSIS — I69392 Facial weakness following cerebral infarction: Secondary | ICD-10-CM | POA: Diagnosis not present

## 2018-04-14 DIAGNOSIS — R41841 Cognitive communication deficit: Secondary | ICD-10-CM | POA: Diagnosis not present

## 2018-04-14 DIAGNOSIS — R262 Difficulty in walking, not elsewhere classified: Secondary | ICD-10-CM | POA: Diagnosis not present

## 2018-04-15 DIAGNOSIS — R41841 Cognitive communication deficit: Secondary | ICD-10-CM | POA: Diagnosis not present

## 2018-04-15 DIAGNOSIS — R262 Difficulty in walking, not elsewhere classified: Secondary | ICD-10-CM | POA: Diagnosis not present

## 2018-04-15 DIAGNOSIS — I69392 Facial weakness following cerebral infarction: Secondary | ICD-10-CM | POA: Diagnosis not present

## 2018-04-15 DIAGNOSIS — G9341 Metabolic encephalopathy: Secondary | ICD-10-CM | POA: Diagnosis not present

## 2018-04-15 DIAGNOSIS — I69391 Dysphagia following cerebral infarction: Secondary | ICD-10-CM | POA: Diagnosis not present

## 2018-04-15 DIAGNOSIS — M6281 Muscle weakness (generalized): Secondary | ICD-10-CM | POA: Diagnosis not present

## 2018-04-18 DIAGNOSIS — R262 Difficulty in walking, not elsewhere classified: Secondary | ICD-10-CM | POA: Diagnosis not present

## 2018-04-18 DIAGNOSIS — R41841 Cognitive communication deficit: Secondary | ICD-10-CM | POA: Diagnosis not present

## 2018-04-18 DIAGNOSIS — M6281 Muscle weakness (generalized): Secondary | ICD-10-CM | POA: Diagnosis not present

## 2018-04-18 DIAGNOSIS — I69392 Facial weakness following cerebral infarction: Secondary | ICD-10-CM | POA: Diagnosis not present

## 2018-04-18 DIAGNOSIS — G9341 Metabolic encephalopathy: Secondary | ICD-10-CM | POA: Diagnosis not present

## 2018-04-18 DIAGNOSIS — I69391 Dysphagia following cerebral infarction: Secondary | ICD-10-CM | POA: Diagnosis not present

## 2018-04-19 DIAGNOSIS — R262 Difficulty in walking, not elsewhere classified: Secondary | ICD-10-CM | POA: Diagnosis not present

## 2018-04-19 DIAGNOSIS — I69392 Facial weakness following cerebral infarction: Secondary | ICD-10-CM | POA: Diagnosis not present

## 2018-04-19 DIAGNOSIS — I69391 Dysphagia following cerebral infarction: Secondary | ICD-10-CM | POA: Diagnosis not present

## 2018-04-19 DIAGNOSIS — G9341 Metabolic encephalopathy: Secondary | ICD-10-CM | POA: Diagnosis not present

## 2018-04-19 DIAGNOSIS — R41841 Cognitive communication deficit: Secondary | ICD-10-CM | POA: Diagnosis not present

## 2018-04-19 DIAGNOSIS — M6281 Muscle weakness (generalized): Secondary | ICD-10-CM | POA: Diagnosis not present

## 2018-04-20 DIAGNOSIS — I69391 Dysphagia following cerebral infarction: Secondary | ICD-10-CM | POA: Diagnosis not present

## 2018-04-20 DIAGNOSIS — M6281 Muscle weakness (generalized): Secondary | ICD-10-CM | POA: Diagnosis not present

## 2018-04-20 DIAGNOSIS — R262 Difficulty in walking, not elsewhere classified: Secondary | ICD-10-CM | POA: Diagnosis not present

## 2018-04-20 DIAGNOSIS — G9341 Metabolic encephalopathy: Secondary | ICD-10-CM | POA: Diagnosis not present

## 2018-04-20 DIAGNOSIS — R41841 Cognitive communication deficit: Secondary | ICD-10-CM | POA: Diagnosis not present

## 2018-04-20 DIAGNOSIS — I69392 Facial weakness following cerebral infarction: Secondary | ICD-10-CM | POA: Diagnosis not present

## 2018-04-21 DIAGNOSIS — R41841 Cognitive communication deficit: Secondary | ICD-10-CM | POA: Diagnosis not present

## 2018-04-21 DIAGNOSIS — M6281 Muscle weakness (generalized): Secondary | ICD-10-CM | POA: Diagnosis not present

## 2018-04-21 DIAGNOSIS — G9341 Metabolic encephalopathy: Secondary | ICD-10-CM | POA: Diagnosis not present

## 2018-04-21 DIAGNOSIS — I69392 Facial weakness following cerebral infarction: Secondary | ICD-10-CM | POA: Diagnosis not present

## 2018-04-21 DIAGNOSIS — R262 Difficulty in walking, not elsewhere classified: Secondary | ICD-10-CM | POA: Diagnosis not present

## 2018-04-21 DIAGNOSIS — I69391 Dysphagia following cerebral infarction: Secondary | ICD-10-CM | POA: Diagnosis not present

## 2018-04-22 DIAGNOSIS — R41841 Cognitive communication deficit: Secondary | ICD-10-CM | POA: Diagnosis not present

## 2018-04-22 DIAGNOSIS — I69392 Facial weakness following cerebral infarction: Secondary | ICD-10-CM | POA: Diagnosis not present

## 2018-04-22 DIAGNOSIS — R262 Difficulty in walking, not elsewhere classified: Secondary | ICD-10-CM | POA: Diagnosis not present

## 2018-04-22 DIAGNOSIS — M6281 Muscle weakness (generalized): Secondary | ICD-10-CM | POA: Diagnosis not present

## 2018-04-22 DIAGNOSIS — G9341 Metabolic encephalopathy: Secondary | ICD-10-CM | POA: Diagnosis not present

## 2018-04-22 DIAGNOSIS — I69391 Dysphagia following cerebral infarction: Secondary | ICD-10-CM | POA: Diagnosis not present

## 2018-04-23 DIAGNOSIS — M6281 Muscle weakness (generalized): Secondary | ICD-10-CM | POA: Diagnosis not present

## 2018-04-23 DIAGNOSIS — I69391 Dysphagia following cerebral infarction: Secondary | ICD-10-CM | POA: Diagnosis not present

## 2018-04-23 DIAGNOSIS — G9341 Metabolic encephalopathy: Secondary | ICD-10-CM | POA: Diagnosis not present

## 2018-04-23 DIAGNOSIS — R41841 Cognitive communication deficit: Secondary | ICD-10-CM | POA: Diagnosis not present

## 2018-04-23 DIAGNOSIS — I69392 Facial weakness following cerebral infarction: Secondary | ICD-10-CM | POA: Diagnosis not present

## 2018-04-23 DIAGNOSIS — R262 Difficulty in walking, not elsewhere classified: Secondary | ICD-10-CM | POA: Diagnosis not present

## 2018-04-25 DIAGNOSIS — I69392 Facial weakness following cerebral infarction: Secondary | ICD-10-CM | POA: Diagnosis not present

## 2018-04-25 DIAGNOSIS — G9341 Metabolic encephalopathy: Secondary | ICD-10-CM | POA: Diagnosis not present

## 2018-04-25 DIAGNOSIS — R262 Difficulty in walking, not elsewhere classified: Secondary | ICD-10-CM | POA: Diagnosis not present

## 2018-04-25 DIAGNOSIS — M6281 Muscle weakness (generalized): Secondary | ICD-10-CM | POA: Diagnosis not present

## 2018-04-25 DIAGNOSIS — I739 Peripheral vascular disease, unspecified: Secondary | ICD-10-CM | POA: Diagnosis not present

## 2018-04-25 DIAGNOSIS — I509 Heart failure, unspecified: Secondary | ICD-10-CM | POA: Diagnosis not present

## 2018-04-25 DIAGNOSIS — N189 Chronic kidney disease, unspecified: Secondary | ICD-10-CM | POA: Diagnosis not present

## 2018-04-25 DIAGNOSIS — I69391 Dysphagia following cerebral infarction: Secondary | ICD-10-CM | POA: Diagnosis not present

## 2018-04-25 DIAGNOSIS — R41841 Cognitive communication deficit: Secondary | ICD-10-CM | POA: Diagnosis not present

## 2018-04-25 DIAGNOSIS — I10 Essential (primary) hypertension: Secondary | ICD-10-CM | POA: Diagnosis not present

## 2018-04-28 ENCOUNTER — Other Ambulatory Visit: Payer: Self-pay

## 2018-04-28 DIAGNOSIS — Z8673 Personal history of transient ischemic attack (TIA), and cerebral infarction without residual deficits: Secondary | ICD-10-CM

## 2018-04-28 DIAGNOSIS — I779 Disorder of arteries and arterioles, unspecified: Secondary | ICD-10-CM

## 2018-04-28 DIAGNOSIS — F17200 Nicotine dependence, unspecified, uncomplicated: Secondary | ICD-10-CM

## 2018-04-28 DIAGNOSIS — I6523 Occlusion and stenosis of bilateral carotid arteries: Secondary | ICD-10-CM

## 2018-05-03 DIAGNOSIS — I130 Hypertensive heart and chronic kidney disease with heart failure and stage 1 through stage 4 chronic kidney disease, or unspecified chronic kidney disease: Secondary | ICD-10-CM | POA: Diagnosis not present

## 2018-05-03 DIAGNOSIS — G479 Sleep disorder, unspecified: Secondary | ICD-10-CM | POA: Diagnosis not present

## 2018-05-03 DIAGNOSIS — I480 Paroxysmal atrial fibrillation: Secondary | ICD-10-CM | POA: Diagnosis not present

## 2018-05-03 DIAGNOSIS — M6281 Muscle weakness (generalized): Secondary | ICD-10-CM | POA: Diagnosis not present

## 2018-05-03 DIAGNOSIS — F419 Anxiety disorder, unspecified: Secondary | ICD-10-CM | POA: Diagnosis not present

## 2018-05-03 DIAGNOSIS — I509 Heart failure, unspecified: Secondary | ICD-10-CM | POA: Diagnosis not present

## 2018-05-03 DIAGNOSIS — N182 Chronic kidney disease, stage 2 (mild): Secondary | ICD-10-CM | POA: Diagnosis not present

## 2018-05-03 DIAGNOSIS — Z7901 Long term (current) use of anticoagulants: Secondary | ICD-10-CM | POA: Diagnosis not present

## 2018-05-03 DIAGNOSIS — K5903 Drug induced constipation: Secondary | ICD-10-CM | POA: Diagnosis not present

## 2018-05-03 DIAGNOSIS — E440 Moderate protein-calorie malnutrition: Secondary | ICD-10-CM | POA: Diagnosis not present

## 2018-05-03 DIAGNOSIS — I739 Peripheral vascular disease, unspecified: Secondary | ICD-10-CM | POA: Diagnosis not present

## 2018-05-03 DIAGNOSIS — L89152 Pressure ulcer of sacral region, stage 2: Secondary | ICD-10-CM | POA: Diagnosis not present

## 2018-05-04 ENCOUNTER — Ambulatory Visit: Payer: Medicare Other

## 2018-05-04 ENCOUNTER — Ambulatory Visit (HOSPITAL_COMMUNITY): Payer: Medicare Other

## 2018-11-09 ENCOUNTER — Other Ambulatory Visit: Payer: Self-pay | Admitting: Cardiovascular Disease

## 2018-12-15 ENCOUNTER — Ambulatory Visit: Payer: BLUE CROSS/BLUE SHIELD | Admitting: Podiatry

## 2018-12-26 ENCOUNTER — Telehealth: Payer: Self-pay | Admitting: Cardiovascular Disease

## 2018-12-26 ENCOUNTER — Other Ambulatory Visit: Payer: Self-pay

## 2018-12-26 MED ORDER — NITROGLYCERIN 0.4 MG SL SUBL: 0.4000 mg | SUBLINGUAL_TABLET | SUBLINGUAL | 5 refills | Status: AC | PRN

## 2018-12-26 NOTE — Telephone Encounter (Signed)
I spoke to the patient's daughter [name]) who is calling because the patient is having intermittent CP, but none presently.  He has an appointment with Vin on 11/4 to f/u.  I informed her that if symptoms worsen, he should go to the ED before then.  I will refill his NTG to have on hand.  They will discuss stomach ache, uncontrolled bowel movement and tiredness at the visit.

## 2018-12-26 NOTE — Telephone Encounter (Signed)
New Message  Pt c/o of Chest Pain: STAT if CP now or developed within 24 hours  1. Are you having CP right now? No  2. Are you experiencing any other symptoms (ex. SOB, nausea, vomiting, sweating)? Stomach ache, uncontrolled bowel movement, tiredness  3. How long have you been experiencing CP? Not sure, mentioned it yesterday  4. Is your CP continuous or coming and going? Coming and Going  5. Have you taken Nitroglycerin? No, he needs refills ?  Scheduled patient for an appointment with B Bhagat on 12/28/18 at 2:00 pm.

## 2018-12-27 NOTE — Progress Notes (Signed)
Cardiology Office Note    Date:  12/28/2018   ID:  [name], DOB 11-07-1940, MRN [id number]  PCP:  Martha Clan, MD  Cardiologist:  Dr. Excell Seltzer   Chief Complaint: 24  Months follow up  History of Present Illness:   [name] is a 78 y.o. male with hx of CAD, permanent atrial fibrillation, CKD III, CVA with residual weakness , HTN, HLD and PAD followed by vascular seen for follow up.  Stress test 03/2015: Low risk stress nuclear study with small, severe, fixed apical defect and large, severe, partially reversible inferolateral defect; findings consistent with apical thinning and prior inferolateral infarct and mild peri-infarct ischemia; EF 50 with akinesis of the basal inferolateral wall. Echo 02/2015: LVEF of 45-50%. Hypokinesis of the basal-midinferolateral, inferior, and inferoseptal myocardium.  Seen for for follow up.  Patient has left-sided weakness due to stroke.  Unfortunate condition, he lives by himself.  Use [place] for ambulation but very limited.  Daughter comes once every week.  His neighbors helps with cooking often.  His friend brought him here today.  He complains of intermittent left sided chest pain.  Unable to describe.  No associated shortness of breath, nausea, vomiting or radiation.  Unable to tell if similar to prior angina.  He reports nitroglycerin helps with this type of pain but used nitro greater than 6 months ago.  He denies palpitation, dizziness, lower extremity edema or melena.  Past Medical History:  Diagnosis Date  . Anxiety   . Atrial fibrillation (HCC)   . Bradycardia   . Carotid artery occlusion   . Cataract    bilataeral cateracts removed  . CKD (chronic kidney disease), stage III   . Colon polyps   . Common bile duct stone   . COPD (chronic obstructive pulmonary disease) (HCC)   . 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 CVA (cerebral vascular accident) (HCC)   . Depression   . DVT  (deep venous thrombosis) (HCC)   . Dyslipidemia   . Essential hypertension   . Foot pain   . GERD (gastroesophageal reflux disease)   . History of benign prostatic hypertrophy   . History of leukocytosis   . Hypertension   . MI, old   . Neuromuscular disorder (HCC)    neuropathy  . Neuropathy   . Peripheral vascular disease (HCC)   . Renal failure, chronic    stage 111  . Tobacco abuse     Past Surgical History:  Procedure Laterality Date  . ANKLE SURGERY    . BYPASS GRAFT  2009 or  2010   stent  . CHOLECYSTECTOMY     Gall Bladder  . COLONOSCOPY    . intervention of the RCA     using a single BMS    Current Medications:  Prior to Admission medications   Medication Sig Start Date End Date Taking? Authorizing Provider  amLODipine (NORVASC) 5 MG tablet Take 5 mg by mouth daily. 11/22/18  Yes [provider]  apixaban (ELIQUIS) 5 MG TABS tablet Take 5 mg by mouth 2 (two) times daily.   Yes [provider]  aspirin EC 81 MG tablet Take 81 mg by mouth daily.   Yes [provider]  atorvastatin (LIPITOR) 40 MG tablet Take 40 mg by mouth at bedtime. 11/22/18  Yes [provider]  diazepam (VALIUM) 2 MG tablet Take 2 mg by mouth 4 (four) times daily as needed.  12/06/18  Yes [provider]  finasteride (PROSCAR) 5 MG tablet Take 5 mg by mouth 2 (two) times daily.  08/17/12  Yes [provider]  furosemide (LASIX) 20 MG tablet Take 20 mg by mouth 2 (two) times daily. 07/11/13  Yes [provider]  HYDROcodone-acetaminophen (NORCO/VICODIN) 5-325 MG tablet Take 1 tablet by mouth every 6 (six) hours as needed. 11/28/18  Yes [provider]  isosorbide mononitrate (IMDUR) 30 MG 24 hr tablet Take 1 tablet (30 mg total) by mouth daily. 06/14/17  Yes Tonny Bollman, MD  lubiprostone (AMITIZA) 8 MCG capsule Take 8 mcg by mouth 2 (two) times daily with a meal.   Yes [provider]  Melatonin 5 MG CAPS Take 5 mg by  mouth at bedtime.   Yes [provider]  mirtazapine (REMERON) 15 MG tablet Take 7.5 mg by mouth at bedtime. 12/18/18  Yes [provider]  nitroGLYCERIN (NITROSTAT) 0.4 MG SL tablet Place 1 tablet (0.4 mg total) under the tongue every 5 (five) minutes as needed for chest pain. 12/26/18  Yes Chara Marquard, PA  omeprazole (PRILOSEC) 40 MG capsule Take 1 capsule (40 mg total) by mouth daily. 03/13/14  Yes Pyrtle, Carie Caddy, MD  potassium chloride SA (KLOR-CON) 20 MEQ tablet Take 20 mEq by mouth daily.   Yes [provider]  sertraline (ZOLOFT) 100 MG tablet Take 100 mg by mouth 2 (two) times daily.  03/13/15  Yes [provider]    Allergies:   Hctz [hydrochlorothiazide], Amitriptyline, Gabapentin, Metoprolol tartrate, Paroxetine, and Spiriva handihaler [tiotropium bromide monohydrate]   Social History   Socioeconomic History  . Marital status: Single    Spouse name: Not on file  . Number of children: Not on file  . Years of education: Not on file  . Highest education level: Not on file  Occupational History  . Not on file  Social Needs  . Financial resource strain: Not on file  . Food insecurity    Worry: Not on file    Inability: Not on file  . Transportation needs    Medical: Not on file    Non-medical: Not on file  Tobacco Use  . Smoking status: Current Every Day Smoker    Packs/day: 1.00    Types: Cigarettes  . Smokeless tobacco: Current User    Last attempt to quit: 09/13/2012  . Tobacco comment: currently using e-cigarettes and regular cigarettes.  Substance and Sexual Activity  . Alcohol use: No    Alcohol/week: 0.0 standard drinks  . Drug use: No  . Sexual activity: Not on file  Lifestyle  . Physical activity    Days per week: Not on file    Minutes per session: Not on file  . Stress: Not on file  Relationships  . Social Musician on phone: Not on file    Gets together: Not on file    Attends religious service: Not  on file    Active member of club or organization: Not on file    Attends meetings of clubs or organizations: Not on file    Relationship status: Not on file  Other Topics Concern  . Not on file  Social History Narrative  . Not on file     Family History:  The patient's family history includes AAA (abdominal aortic aneurysm) in his brother; CVA in his father; Cancer in his brother; Heart attack in his brother, father, and mother; Heart disease in his brother and  brother; Hypertension in his brother, father, and mother; Stroke in his mother.   ROS:   Please see the history of present illness.    ROS All other systems reviewed and are negative.   PHYSICAL EXAM:   VS:  BP 130/64   Pulse 88   Ht 6' (1.829 m)   Wt 225 lb (102.1 kg)   SpO2 97%   BMI 30.52 kg/m    GEN: Well nourished, well developed, in no acute distress  HEENT: normal  Neck: no JVD, carotid bruits, or masses Cardiac: Irregularly irregular; no murmurs, rubs, or gallops,no edema  Respiratory:  clear to auscultation bilaterally, normal work of breathing GI: soft, nontender, nondistended, + BS MS: no deformity or atrophy  Skin: warm and dry, no rash Neuro:  Alert and Oriented x 3, Psych: euthymic mood, full affect  Wt Readings from Last 3 Encounters:  12/28/18 225 lb (102.1 kg)  02/10/17 215 lb 12.8 oz (97.9 kg)  11/10/16 214 lb 1.9 oz (97.1 kg)      Studies/Labs Reviewed:   EKG:  EKG is ordered today.  The ekg ordered today demonstrates atrial fibrillation of 94 bpm  Recent Labs: 01/17/2018: ALT 8; BUN 13; Creatinine, Ser 1.49; Hemoglobin 14.0; Platelets 241; Potassium 3.6; Sodium 139   Lipid Panel    Component Value Date/Time   CHOL 74 03/21/2015 0533   TRIG 50 03/21/2015 0533   HDL 29 (L) 03/21/2015 0533   CHOLHDL 2.6 03/21/2015 0533   VLDL 10 03/21/2015 0533   LDLCALC 35 03/21/2015 0533    Additional studies/ records that were reviewed today include:   Carotid Duplex (10/08/16): 1-39% stenosis  of the right proximal internal carotid artery  40-59% stenosis of the left proximal internal carotid artery. Bilateral vertebral artery flow is antegrade.  Bilateral subclavian artery waveforms are normal.  No significant change noted when compared to the previous exams on 09/13/13, 09/20/14, and 10/08/15.  ABI (Date: 10/08/2016)  R:  ? ABI: 0.86 (1.01 on 10-08-15),  ? PT: mono ? DP: mono ? TBI:  0.51 (was 0.53)  L:  ? ABI: 1.23 (was Abram),  ? PT: bi ? DP: absent ? TBI: 0.46 (was 0.63)  ABI's are unreliable, do not correspond to waveforms: mono and biphasic.      ASSESSMENT & PLAN:    1. Chest pain with history of CAD Last stress test in 2017 as summarized above.  She has intermittent left-sided chest pain which could be anginal equivalent.  He used nitro 6 months ago but pain is more worsening in past few weeks.  Given his comorbid condition and social situation, increase Imdur to 60 mg daily.  Advised to use sublingual nitroglycerin.  He will give [place] call if worsening of symptoms.  Continue aspirin 81 mg and statin.  2. HTN -Blood pressure stable on current medication.  3. HLD -Check labs today.  Continue Lipitor.  4. PAD - Followed by Vascular  -Unable to evaluate claudication symptoms due to limited ambulation.  5. Permanent atrial fibrillation  -Rate control.  No bleeding issue.  6.  Social situation -We will try to assign social worker.   Medication Adjustments/Labs and Tests Ordered: Current medicines are reviewed at length with the patient today.  Concerns regarding medicines are outlined above.  Medication changes, Labs and Tests ordered today are listed in the Patient Instructions below. Patient Instructions   Medication Instructions:   Your physician has recommended you make the following change in your medication:   Increase  your Imdur to 60MG , 1 tablet by mouth once a day  *If you need a refill on your cardiac medications before your next appointment,  please call your pharmacy*  Lab Work:  You will have labs drawn today: CBC, CMP, Lipid panel  If you have labs (blood work) drawn today and your tests are completely normal, you will receive your results only by: Marland Kitchen MyChart Message (if you have MyChart) OR . A paper copy in the mail If you have any lab test that is abnormal or we need to change your treatment, we will call you to review the results.  Testing/Procedures:  None ordered today  Follow-Up:  On 04/19/2019 at 11:00AM with Tonny Bollman, MD    Signed, Manson Passey, [place]  12/28/2018 2:38 PM    [facility] [address] [place], [place], [place]  [address] Phone: [phone]; Fax: [phone]

## 2018-12-28 ENCOUNTER — Other Ambulatory Visit: Payer: Self-pay

## 2018-12-28 ENCOUNTER — Encounter: Payer: Self-pay | Admitting: Physician Assistant

## 2018-12-28 ENCOUNTER — Ambulatory Visit (INDEPENDENT_AMBULATORY_CARE_PROVIDER_SITE_OTHER): Payer: Medicare Other | Admitting: Physician Assistant

## 2018-12-28 VITALS — BP 130/64 | HR 88 | Ht 72.0 in | Wt 225.0 lb

## 2018-12-28 DIAGNOSIS — I779 Disorder of arteries and arterioles, unspecified: Secondary | ICD-10-CM

## 2018-12-28 DIAGNOSIS — I739 Peripheral vascular disease, unspecified: Secondary | ICD-10-CM

## 2018-12-28 DIAGNOSIS — I10 Essential (primary) hypertension: Secondary | ICD-10-CM

## 2018-12-28 DIAGNOSIS — E785 Hyperlipidemia, unspecified: Secondary | ICD-10-CM | POA: Diagnosis not present

## 2018-12-28 DIAGNOSIS — I25110 Atherosclerotic heart disease of native coronary artery with unstable angina pectoris: Secondary | ICD-10-CM | POA: Diagnosis not present

## 2018-12-28 DIAGNOSIS — I2510 Atherosclerotic heart disease of native coronary artery without angina pectoris: Secondary | ICD-10-CM

## 2018-12-28 DIAGNOSIS — I4821 Permanent atrial fibrillation: Secondary | ICD-10-CM | POA: Diagnosis not present

## 2018-12-28 MED ORDER — ISOSORBIDE MONONITRATE ER 60 MG PO TB24: 60.0000 mg | ORAL_TABLET | Freq: Every day | ORAL | 3 refills | Status: DC

## 2018-12-28 NOTE — Patient Instructions (Addendum)
  Medication Instructions:   Your physician has recommended you make the following change in your medication:   Increase your Imdur to 60MG , 1 tablet by mouth once a day  *If you need a refill on your cardiac medications before your next appointment, please call your pharmacy*  Lab Work:  You will have labs drawn today: CBC, CMP, Lipid panel  If you have labs (blood work) drawn today and your tests are completely normal, you will receive your results only by: Marland Kitchen MyChart Message (if you have MyChart) OR . A paper copy in the mail If you have any lab test that is abnormal or we need to change your treatment, we will call you to review the results.  Testing/Procedures:  None ordered today  Follow-Up:  On 04/19/2019 at 11:00AM with Sherren Mocha, MD

## 2018-12-29 ENCOUNTER — Telehealth: Payer: Self-pay | Admitting: *Deleted

## 2018-12-29 LAB — COMPREHENSIVE METABOLIC PANEL
ALT: 20 IU/L (ref 0–44)
AST: 24 IU/L (ref 0–40)
Albumin/Globulin Ratio: 1.4 (ref 1.2–2.2)
Albumin: 4.2 g/dL (ref 3.7–4.7)
Alkaline Phosphatase: 94 IU/L (ref 39–117)
BUN/Creatinine Ratio: 13 (ref 10–24)
BUN: 26 mg/dL (ref 8–27)
Bilirubin Total: 0.4 mg/dL (ref 0.0–1.2)
CO2: 20 mmol/L (ref 20–29)
Calcium: 9.3 mg/dL (ref 8.6–10.2)
Chloride: 103 mmol/L (ref 96–106)
Creatinine, Ser: 2.01 mg/dL — ABNORMAL HIGH (ref 0.76–1.27)
GFR calc Af Amer: 35 mL/min/{1.73_m2} — ABNORMAL LOW (ref >59)
GFR calc non Af Amer: 30 mL/min/{1.73_m2} — ABNORMAL LOW (ref >59)
Globulin, Total: 2.9 g/dL (ref 1.5–4.5)
Glucose: 99 mg/dL (ref 65–99)
Potassium: 4.2 mmol/L (ref 3.5–5.2)
Sodium: 139 mmol/L (ref 134–144)
Total Protein: 7.1 g/dL (ref 6.0–8.5)

## 2018-12-29 LAB — CBC
Hematocrit: 35.8 % — ABNORMAL LOW (ref 37.5–51.0)
Hemoglobin: 11.7 g/dL — ABNORMAL LOW (ref 13.0–17.7)
MCH: 27.9 pg (ref 26.6–33.0)
MCHC: 32.7 g/dL (ref 31.5–35.7)
MCV: 85 fL (ref 79–97)
Platelets: 247 10*3/uL (ref 150–450)
RBC: 4.2 x10E6/uL (ref 4.14–5.80)
RDW: 13.9 % (ref 11.6–15.4)
WBC: 8.5 10*3/uL (ref 3.4–10.8)

## 2018-12-29 LAB — LIPID PANEL
Chol/HDL Ratio: 2 ratio (ref 0.0–5.0)
Cholesterol, Total: 83 mg/dL — ABNORMAL LOW (ref 100–199)
HDL: 41 mg/dL (ref >39)
LDL Chol Calc (NIH): 25 mg/dL (ref 0–99)
Triglycerides: 82 mg/dL (ref 0–149)
VLDL Cholesterol Cal: 17 mg/dL (ref 5–40)

## 2018-12-29 MED ORDER — APIXABAN 2.5 MG PO TABS: 2.5000 mg | ORAL_TABLET | Freq: Two times a day (BID) | ORAL | 3 refills | Status: AC

## 2018-12-29 NOTE — Telephone Encounter (Signed)
-----   Message from [place], [place] sent at 12/29/2018  1:28 PM EST ----- Excellent lipid profile. Hemoglobin okay. His creatinine (renal function is worsen from prior). Reduce Eliquis to 2.5mg  (dose appropriate for age and renal function).

## 2018-12-29 NOTE — Telephone Encounter (Signed)
Follow Up:    Returning your call,, concerning pt's lab results.

## 2018-12-29 NOTE — Telephone Encounter (Signed)
Returned call to pt's daughter, [name], [place] on file. She has been made aware of pt's lab results. See result note.

## 2019-01-04 ENCOUNTER — Telehealth (HOSPITAL_COMMUNITY): Payer: Self-pay | Admitting: Licensed Clinical Social Worker

## 2019-01-04 NOTE — Telephone Encounter (Signed)
CSW received consult regarding concerns with patients ability to prepare food at home following a recent stroke.  CSW called and spoke with pt daughter regarding concerns.  Daughter reports that pt had been in a SNF up until March of this year and they had been trying to get him in long term when the pt decided he did not want to be in a long term care facility.  Now that patient is home he is struggling somewhat with caring for himself- he is able to do most ADLs but the daughter doesn't think he is managing very well and sounds like he also misses having help with his needs.  CSW discussed [facility] food program to help with food preparation concerns short term but daughter doesn't think he will like this as he is a picky eater and usually eats fast food at home- states he won't eat anything he doesn't like so thinks it would be a waste.  Daughters main wish is to get patient back in a care facility where he can have assistance when needed.  Daughter states he was over reserve limit for Medicaid when they were looking into it when he was in the SNF before- CSW endorsed this would continue to be an issue.  Pt dtr agreeable to CSW reaching out to a representative who assists in finding ALF for folks to discuss options and possible availabilities at this time.  CSW made referral.  No further concerns at this time.  Jorge Ny, LCSW Clinical Social Worker [facility] Desk#: [phone] Cell#: [phone]

## 2019-01-17 ENCOUNTER — Ambulatory Visit (INDEPENDENT_AMBULATORY_CARE_PROVIDER_SITE_OTHER): Payer: Medicare Other | Admitting: Podiatry

## 2019-01-17 ENCOUNTER — Other Ambulatory Visit: Payer: Self-pay

## 2019-01-17 DIAGNOSIS — M79674 Pain in right toe(s): Secondary | ICD-10-CM

## 2019-01-17 DIAGNOSIS — L84 Corns and callosities: Secondary | ICD-10-CM | POA: Diagnosis not present

## 2019-01-17 DIAGNOSIS — Z7901 Long term (current) use of anticoagulants: Secondary | ICD-10-CM

## 2019-01-17 DIAGNOSIS — M79675 Pain in left toe(s): Secondary | ICD-10-CM | POA: Diagnosis not present

## 2019-01-17 DIAGNOSIS — I779 Disorder of arteries and arterioles, unspecified: Secondary | ICD-10-CM

## 2019-01-17 DIAGNOSIS — B351 Tinea unguium: Secondary | ICD-10-CM

## 2019-01-18 NOTE — Progress Notes (Signed)
Subjective:   Patient ID: [name], male   DOB: 78 y.o.   MRN: [id number]   HPI 78 year old male with past medical history significant for chronic kidney disease stage III, coronary disease, carotid stenosis, tobacco on Eliquis use presents to the office today for concerns of thick, elongated toenails they cannot trim himself as well as a callus on the bottom of his right midfoot x2.  He also states his nails are very thick and elongated he cannot trim them himself.  Denies any redness or drainage or any swelling.  No other concerns.   Review of Systems  All other systems reviewed and are negative.  Past Medical History:  Diagnosis Date  . Anxiety   . Atrial fibrillation ([place])   . Bradycardia   . Carotid artery occlusion   . Cataract    bilataeral cateracts removed  . CKD (chronic kidney disease), stage III   . Colon polyps   . Common bile duct stone   . COPD (chronic obstructive pulmonary disease) ([place])   . 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 CVA (cerebral vascular accident) ([place])   . Depression   . DVT (deep venous thrombosis) ([place])   . Dyslipidemia   . Essential hypertension   . Foot pain   . GERD (gastroesophageal reflux disease)   . History of benign prostatic hypertrophy   . History of leukocytosis   . Hypertension   . MI, old   . Neuromuscular disorder ([facility])    neuropathy  . Neuropathy   . Peripheral vascular disease ([place])   . Renal failure, chronic    stage 111  . Tobacco abuse     Past Surgical History:  Procedure Laterality Date  . ANKLE SURGERY    . BYPASS GRAFT  2009 or  2010   stent  . CHOLECYSTECTOMY     Gall Bladder  . COLONOSCOPY    . intervention of the RCA     using a single BMS     Current Outpatient Medications:  .  amLODipine (NORVASC) 5 MG tablet, Take 5 mg by mouth daily., Disp: , Rfl:  .  apixaban (ELIQUIS) 2.5 MG TABS tablet, Take 1 tablet (2.5 mg total) by mouth 2 (two) times  daily., Disp: 60 tablet, Rfl: 3 .  aspirin EC 81 MG tablet, Take 81 mg by mouth daily., Disp: , Rfl:  .  atorvastatin (LIPITOR) 40 MG tablet, Take 40 mg by mouth at bedtime., Disp: , Rfl:  .  diazepam (VALIUM) 2 MG tablet, Take 2 mg by mouth 4 (four) times daily as needed., Disp: , Rfl:  .  finasteride (PROSCAR) 5 MG tablet, Take 5 mg by mouth 2 (two) times daily. , Disp: , Rfl:  .  furosemide (LASIX) 20 MG tablet, Take 20 mg by mouth 2 (two) times daily., Disp: , Rfl:  .  HYDROcodone-acetaminophen (NORCO/VICODIN) 5-325 MG tablet, Take 1 tablet by mouth every 6 (six) hours as needed., Disp: , Rfl:  .  isosorbide mononitrate (IMDUR) 60 MG 24 hr tablet, Take 1 tablet (60 mg total) by mouth daily., Disp: 90 tablet, Rfl: 3 .  lubiprostone (AMITIZA) 8 MCG capsule, Take 8 mcg by mouth 2 (two) times daily with a meal., Disp: , Rfl:  .  Melatonin 5 MG CAPS, Take 5 mg by mouth at bedtime., Disp: , Rfl:  .  mirtazapine (REMERON) 15 MG tablet, Take 7.5 mg by mouth at bedtime., Disp: ,  Rfl:  .  nitroGLYCERIN (NITROSTAT) 0.4 MG SL tablet, Place 1 tablet (0.4 mg total) under the tongue every 5 (five) minutes as needed for chest pain., Disp: 25 tablet, Rfl: 5 .  omeprazole (PRILOSEC) 40 MG capsule, Take 1 capsule (40 mg total) by mouth daily., Disp: 90 capsule, Rfl: 3 .  potassium chloride SA (KLOR-CON) 20 MEQ tablet, Take 20 mEq by mouth daily., Disp: , Rfl:  .  sertraline (ZOLOFT) 100 MG tablet, Take 100 mg by mouth 2 (two) times daily. , Disp: , Rfl:   Allergies  Allergen Reactions  . Hctz [Hydrochlorothiazide] Anaphylaxis and Other (See Comments)    Throat Swelling  . Amitriptyline Other (See Comments)    Made me go crazy  . Gabapentin Other (See Comments)    Made me go crazy  . Metoprolol Tartrate Other (See Comments)    fatigue  . Paroxetine Other (See Comments)    Made me go crazy  . Spiriva Handihaler [Tiotropium Bromide Monohydrate] Other (See Comments)    Urinary retention           Objective:  Physical Exam  General: AAO x3, NAD  Dermatological: Nails appear to be hypertrophic, dystrophic, discolored with yellow-brown discoloration and are brittle.  Hyperkeratotic lesions right plantar midfoot without any underlying ulceration drainage or any signs of infection.  No open lesions.  Vascular: Dorsalis Pedis artery and Posterior Tibial artery pedal pulses are 2/4 bilateral with immedate capillary fill time.There is no pain with calf compression, swelling, warmth, erythema.   Neruologic: Grossly intact via light touch bilateral.   Musculoskeletal: No gross boney pedal deformities bilateral. No pain, crepitus, or limitation noted with foot and ankle range of motion bilateral. Muscular strength 5/5 in all groups tested bilateral.  Gait: Unassisted, Nonantalgic.       Assessment:   78 year old male with symptomatic onychomycosis, hyperkeratotic lesions on Eliquis     Plan:  -Treatment options discussed including all alternatives, risks, and complications -Etiology of symptoms were discussed -Hyperkeratotic lesion sharply debrided x2 without any complications or bleeding -Nails sharply debrided x10 without any complications or bleeding -Discussed importance of daily foot inspection  Return in about 3 months (around 04/19/2019).  Trula Slade DPM

## 2019-02-25 ENCOUNTER — Emergency Department (HOSPITAL_COMMUNITY): Payer: Medicare Other

## 2019-02-25 ENCOUNTER — Inpatient Hospital Stay (HOSPITAL_COMMUNITY)
Admission: EM | Admit: 2019-02-25 | Discharge: 2019-03-03 | DRG: 177 | Disposition: A | Discharge status: Skilled Nursing Facility | Payer: Medicare Other | Attending: Internal Medicine | Admitting: Internal Medicine

## 2019-02-25 DIAGNOSIS — Z741 Need for assistance with personal care: Secondary | ICD-10-CM | POA: Diagnosis present

## 2019-02-25 DIAGNOSIS — J690 Pneumonitis due to inhalation of food and vomit: Principal | ICD-10-CM | POA: Diagnosis present

## 2019-02-25 DIAGNOSIS — E669 Obesity, unspecified: Secondary | ICD-10-CM | POA: Diagnosis present

## 2019-02-25 DIAGNOSIS — Z7901 Long term (current) use of anticoagulants: Secondary | ICD-10-CM

## 2019-02-25 DIAGNOSIS — K219 Gastro-esophageal reflux disease without esophagitis: Secondary | ICD-10-CM | POA: Diagnosis present

## 2019-02-25 DIAGNOSIS — J80 Acute respiratory distress syndrome: Secondary | ICD-10-CM | POA: Diagnosis not present

## 2019-02-25 DIAGNOSIS — Z7401 Bed confinement status: Secondary | ICD-10-CM | POA: Diagnosis not present

## 2019-02-25 DIAGNOSIS — W010XXA Fall on same level from slipping, tripping and stumbling without subsequent striking against object, initial encounter: Secondary | ICD-10-CM | POA: Diagnosis present

## 2019-02-25 DIAGNOSIS — Z9049 Acquired absence of other specified parts of digestive tract: Secondary | ICD-10-CM

## 2019-02-25 DIAGNOSIS — M25559 Pain in unspecified hip: Secondary | ICD-10-CM

## 2019-02-25 DIAGNOSIS — R109 Unspecified abdominal pain: Secondary | ICD-10-CM | POA: Diagnosis not present

## 2019-02-25 DIAGNOSIS — T796XXA Traumatic ischemia of muscle, initial encounter: Secondary | ICD-10-CM | POA: Diagnosis present

## 2019-02-25 DIAGNOSIS — S199XXA Unspecified injury of neck, initial encounter: Secondary | ICD-10-CM | POA: Diagnosis not present

## 2019-02-25 DIAGNOSIS — I480 Paroxysmal atrial fibrillation: Secondary | ICD-10-CM | POA: Diagnosis present

## 2019-02-25 DIAGNOSIS — Z20822 Contact with and (suspected) exposure to covid-19: Secondary | ICD-10-CM | POA: Diagnosis present

## 2019-02-25 DIAGNOSIS — M6282 Rhabdomyolysis: Secondary | ICD-10-CM | POA: Diagnosis present

## 2019-02-25 DIAGNOSIS — E785 Hyperlipidemia, unspecified: Secondary | ICD-10-CM | POA: Diagnosis present

## 2019-02-25 DIAGNOSIS — Z79899 Other long term (current) drug therapy: Secondary | ICD-10-CM

## 2019-02-25 DIAGNOSIS — M25551 Pain in right hip: Secondary | ICD-10-CM | POA: Diagnosis present

## 2019-02-25 DIAGNOSIS — E782 Mixed hyperlipidemia: Secondary | ICD-10-CM | POA: Diagnosis not present

## 2019-02-25 DIAGNOSIS — I252 Old myocardial infarction: Secondary | ICD-10-CM

## 2019-02-25 DIAGNOSIS — N183 Chronic kidney disease, stage 3 unspecified: Secondary | ICD-10-CM | POA: Diagnosis present

## 2019-02-25 DIAGNOSIS — R1319 Other dysphagia: Secondary | ICD-10-CM | POA: Diagnosis present

## 2019-02-25 DIAGNOSIS — Z7982 Long term (current) use of aspirin: Secondary | ICD-10-CM

## 2019-02-25 DIAGNOSIS — I10 Essential (primary) hypertension: Secondary | ICD-10-CM | POA: Diagnosis present

## 2019-02-25 DIAGNOSIS — J449 Chronic obstructive pulmonary disease, unspecified: Secondary | ICD-10-CM | POA: Diagnosis present

## 2019-02-25 DIAGNOSIS — J440 Chronic obstructive pulmonary disease with acute lower respiratory infection: Secondary | ICD-10-CM | POA: Diagnosis present

## 2019-02-25 DIAGNOSIS — R1310 Dysphagia, unspecified: Secondary | ICD-10-CM | POA: Diagnosis present

## 2019-02-25 DIAGNOSIS — I129 Hypertensive chronic kidney disease with stage 1 through stage 4 chronic kidney disease, or unspecified chronic kidney disease: Secondary | ICD-10-CM | POA: Diagnosis present

## 2019-02-25 DIAGNOSIS — N401 Enlarged prostate with lower urinary tract symptoms: Secondary | ICD-10-CM | POA: Diagnosis present

## 2019-02-25 DIAGNOSIS — E875 Hyperkalemia: Secondary | ICD-10-CM | POA: Diagnosis not present

## 2019-02-25 DIAGNOSIS — Z823 Family history of stroke: Secondary | ICD-10-CM

## 2019-02-25 DIAGNOSIS — R350 Frequency of micturition: Secondary | ICD-10-CM | POA: Diagnosis present

## 2019-02-25 DIAGNOSIS — Z888 Allergy status to other drugs, medicaments and biological substances status: Secondary | ICD-10-CM

## 2019-02-25 DIAGNOSIS — I499 Cardiac arrhythmia, unspecified: Secondary | ICD-10-CM | POA: Diagnosis not present

## 2019-02-25 DIAGNOSIS — R4189 Other symptoms and signs involving cognitive functions and awareness: Secondary | ICD-10-CM | POA: Diagnosis present

## 2019-02-25 DIAGNOSIS — Y92002 Bathroom of unspecified non-institutional (private) residence single-family (private) house as the place of occurrence of the external cause: Secondary | ICD-10-CM | POA: Diagnosis not present

## 2019-02-25 DIAGNOSIS — Z66 Do not resuscitate: Secondary | ICD-10-CM | POA: Diagnosis present

## 2019-02-25 DIAGNOSIS — Z86718 Personal history of other venous thrombosis and embolism: Secondary | ICD-10-CM

## 2019-02-25 DIAGNOSIS — I69391 Dysphagia following cerebral infarction: Secondary | ICD-10-CM

## 2019-02-25 DIAGNOSIS — J189 Pneumonia, unspecified organism: Secondary | ICD-10-CM

## 2019-02-25 DIAGNOSIS — F329 Major depressive disorder, single episode, unspecified: Secondary | ICD-10-CM | POA: Diagnosis present

## 2019-02-25 DIAGNOSIS — R2689 Other abnormalities of gait and mobility: Secondary | ICD-10-CM | POA: Diagnosis present

## 2019-02-25 DIAGNOSIS — S301XXA Contusion of abdominal wall, initial encounter: Secondary | ICD-10-CM | POA: Diagnosis present

## 2019-02-25 DIAGNOSIS — R0689 Other abnormalities of breathing: Secondary | ICD-10-CM | POA: Diagnosis not present

## 2019-02-25 DIAGNOSIS — I4891 Unspecified atrial fibrillation: Secondary | ICD-10-CM | POA: Diagnosis not present

## 2019-02-25 DIAGNOSIS — J9601 Acute respiratory failure with hypoxia: Secondary | ICD-10-CM | POA: Diagnosis not present

## 2019-02-25 DIAGNOSIS — T796XXD Traumatic ischemia of muscle, subsequent encounter: Secondary | ICD-10-CM | POA: Diagnosis not present

## 2019-02-25 DIAGNOSIS — Z8249 Family history of ischemic heart disease and other diseases of the circulatory system: Secondary | ICD-10-CM

## 2019-02-25 DIAGNOSIS — R52 Pain, unspecified: Secondary | ICD-10-CM | POA: Diagnosis not present

## 2019-02-25 DIAGNOSIS — R488 Other symbolic dysfunctions: Secondary | ICD-10-CM | POA: Diagnosis present

## 2019-02-25 DIAGNOSIS — F411 Generalized anxiety disorder: Secondary | ICD-10-CM | POA: Diagnosis present

## 2019-02-25 DIAGNOSIS — I2510 Atherosclerotic heart disease of native coronary artery without angina pectoris: Secondary | ICD-10-CM | POA: Diagnosis present

## 2019-02-25 DIAGNOSIS — I739 Peripheral vascular disease, unspecified: Secondary | ICD-10-CM | POA: Diagnosis present

## 2019-02-25 DIAGNOSIS — M25552 Pain in left hip: Secondary | ICD-10-CM | POA: Diagnosis present

## 2019-02-25 DIAGNOSIS — Z6837 Body mass index (BMI) 37.0-37.9, adult: Secondary | ICD-10-CM

## 2019-02-25 DIAGNOSIS — R1032 Left lower quadrant pain: Secondary | ICD-10-CM

## 2019-02-25 DIAGNOSIS — M2550 Pain in unspecified joint: Secondary | ICD-10-CM | POA: Diagnosis not present

## 2019-02-25 DIAGNOSIS — M6281 Muscle weakness (generalized): Secondary | ICD-10-CM | POA: Diagnosis present

## 2019-02-25 DIAGNOSIS — R5381 Other malaise: Secondary | ICD-10-CM | POA: Diagnosis not present

## 2019-02-25 DIAGNOSIS — F17200 Nicotine dependence, unspecified, uncomplicated: Secondary | ICD-10-CM | POA: Diagnosis present

## 2019-02-25 DIAGNOSIS — F17210 Nicotine dependence, cigarettes, uncomplicated: Secondary | ICD-10-CM | POA: Diagnosis present

## 2019-02-25 DIAGNOSIS — I6529 Occlusion and stenosis of unspecified carotid artery: Secondary | ICD-10-CM | POA: Diagnosis present

## 2019-02-25 DIAGNOSIS — S0990XA Unspecified injury of head, initial encounter: Secondary | ICD-10-CM | POA: Diagnosis not present

## 2019-02-25 DIAGNOSIS — R05 Cough: Secondary | ICD-10-CM | POA: Diagnosis not present

## 2019-02-25 DIAGNOSIS — J168 Pneumonia due to other specified infectious organisms: Secondary | ICD-10-CM | POA: Diagnosis not present

## 2019-02-25 DIAGNOSIS — R0902 Hypoxemia: Secondary | ICD-10-CM | POA: Diagnosis not present

## 2019-02-25 DIAGNOSIS — Z79890 Hormone replacement therapy (postmenopausal): Secondary | ICD-10-CM

## 2019-02-25 LAB — CBC WITH DIFFERENTIAL/PLATELET
Abs Immature Granulocytes: 0.1 10*3/uL — ABNORMAL HIGH (ref 0.00–0.07)
Basophils Absolute: 0.1 10*3/uL (ref 0.0–0.1)
Basophils Relative: 0 %
Eosinophils Absolute: 0 10*3/uL (ref 0.0–0.5)
Eosinophils Relative: 0 %
HCT: 37.7 % — ABNORMAL LOW (ref 39.0–52.0)
Hemoglobin: 12.2 g/dL — ABNORMAL LOW (ref 13.0–17.0)
Immature Granulocytes: 1 %
Lymphocytes Relative: 3 %
Lymphs Abs: 0.5 10*3/uL — ABNORMAL LOW (ref 0.7–4.0)
MCH: 27.5 pg (ref 26.0–34.0)
MCHC: 32.4 g/dL (ref 30.0–36.0)
MCV: 84.9 fL (ref 80.0–100.0)
Monocytes Absolute: 1.6 10*3/uL — ABNORMAL HIGH (ref 0.1–1.0)
Monocytes Relative: 9 %
Neutro Abs: 15 10*3/uL — ABNORMAL HIGH (ref 1.7–7.7)
Neutrophils Relative %: 87 %
Platelets: 246 10*3/uL (ref 150–400)
RBC: 4.44 MIL/uL (ref 4.22–5.81)
RDW: 15.4 % (ref 11.5–15.5)
WBC: 17.3 10*3/uL — ABNORMAL HIGH (ref 4.0–10.5)
nRBC: 0 % (ref 0.0–0.2)

## 2019-02-25 LAB — BASIC METABOLIC PANEL
Anion gap: 13 (ref 5–15)
BUN: 29 mg/dL — ABNORMAL HIGH (ref 8–23)
CO2: 24 mmol/L (ref 22–32)
Calcium: 9.3 mg/dL (ref 8.9–10.3)
Chloride: 104 mmol/L (ref 98–111)
Creatinine, Ser: 1.72 mg/dL — ABNORMAL HIGH (ref 0.61–1.24)
GFR calc Af Amer: 43 mL/min — ABNORMAL LOW (ref >60)
GFR calc non Af Amer: 37 mL/min — ABNORMAL LOW (ref >60)
Glucose, Bld: 122 mg/dL — ABNORMAL HIGH (ref 70–99)
Potassium: 5.2 mmol/L — ABNORMAL HIGH (ref 3.5–5.1)
Sodium: 141 mmol/L (ref 135–145)

## 2019-02-25 LAB — RESPIRATORY PANEL BY RT PCR (FLU A&B, COVID)
Influenza A by PCR: NEGATIVE
Influenza B by PCR: NEGATIVE
SARS Coronavirus 2 by RT PCR: NEGATIVE

## 2019-02-25 LAB — CK: Total CK: 6094 U/L — ABNORMAL HIGH (ref 49–397)

## 2019-02-25 LAB — PROTIME-INR
INR: 1.2 (ref 0.8–1.2)
Prothrombin Time: 15.4 seconds — ABNORMAL HIGH (ref 11.4–15.2)

## 2019-02-25 LAB — BRAIN NATRIURETIC PEPTIDE: B Natriuretic Peptide: 157.3 pg/mL — ABNORMAL HIGH (ref 0.0–100.0)

## 2019-02-25 LAB — TROPONIN I (HIGH SENSITIVITY): Troponin I (High Sensitivity): 25 ng/L — ABNORMAL HIGH (ref <18)

## 2019-02-25 MED ORDER — SODIUM CHLORIDE 0.9 % IV SOLN: INTRAVENOUS | Status: DC

## 2019-02-25 MED ORDER — DIAZEPAM 2 MG PO TABS
2.0000 mg | ORAL_TABLET | Freq: Four times a day (QID) | ORAL | Status: DC | PRN
  Administered 2019-02-26: 2 mg via ORAL
  Filled 2019-02-25: qty 1

## 2019-02-25 MED ORDER — SODIUM CHLORIDE 0.9 % IV BOLUS
1000.0000 mL | Freq: Once | INTRAVENOUS | Status: AC
  Administered 2019-02-25: 1000 mL via INTRAVENOUS

## 2019-02-25 MED ORDER — LUBIPROSTONE 8 MCG PO CAPS
8.0000 ug | ORAL_CAPSULE | Freq: Two times a day (BID) | ORAL | Status: DC
  Administered 2019-02-26 – 2019-03-02 (×9): 8 ug via ORAL
  Filled 2019-02-25 (×13): qty 1

## 2019-02-25 MED ORDER — FUROSEMIDE 20 MG PO TABS: 20.0000 mg | ORAL_TABLET | Freq: Two times a day (BID) | ORAL | Status: DC

## 2019-02-25 MED ORDER — PANTOPRAZOLE SODIUM 40 MG PO TBEC
40.0000 mg | DELAYED_RELEASE_TABLET | Freq: Every day | ORAL | Status: DC
  Administered 2019-02-26 – 2019-03-03 (×6): 40 mg via ORAL
  Filled 2019-02-25 (×6): qty 1

## 2019-02-25 MED ORDER — NITROGLYCERIN 0.4 MG SL SUBL: 0.4000 mg | SUBLINGUAL_TABLET | SUBLINGUAL | Status: DC | PRN

## 2019-02-25 MED ORDER — HYDROCODONE-ACETAMINOPHEN 5-325 MG PO TABS
1.0000 | ORAL_TABLET | Freq: Four times a day (QID) | ORAL | Status: DC | PRN
  Administered 2019-02-26 – 2019-03-03 (×9): 1 via ORAL
  Filled 2019-02-25 (×9): qty 1

## 2019-02-25 MED ORDER — MELATONIN 3 MG PO TABS
3.0000 mg | ORAL_TABLET | Freq: Every day | ORAL | Status: DC
  Administered 2019-02-25 – 2019-03-02 (×6): 3 mg via ORAL
  Filled 2019-02-25 (×7): qty 1

## 2019-02-25 MED ORDER — ATORVASTATIN CALCIUM 40 MG PO TABS
40.0000 mg | ORAL_TABLET | Freq: Every day | ORAL | Status: DC
  Administered 2019-02-25 – 2019-03-02 (×6): 40 mg via ORAL
  Filled 2019-02-25 (×6): qty 1

## 2019-02-25 MED ORDER — MIRTAZAPINE 15 MG PO TABS
7.5000 mg | ORAL_TABLET | Freq: Every day | ORAL | Status: DC
  Administered 2019-02-25 – 2019-03-02 (×6): 7.5 mg via ORAL
  Filled 2019-02-25 (×6): qty 1

## 2019-02-25 MED ORDER — SODIUM CHLORIDE 0.9 % IV SOLN
2.0000 g | INTRAVENOUS | Status: DC
  Administered 2019-02-25 – 2019-03-02 (×6): 2 g via INTRAVENOUS
  Filled 2019-02-25 (×2): qty 2
  Filled 2019-02-25: qty 20
  Filled 2019-02-25: qty 2
  Filled 2019-02-25: qty 20
  Filled 2019-02-25 (×2): qty 2

## 2019-02-25 MED ORDER — AMLODIPINE BESYLATE 5 MG PO TABS
5.0000 mg | ORAL_TABLET | Freq: Every day | ORAL | Status: DC
  Administered 2019-02-26 – 2019-03-03 (×6): 5 mg via ORAL
  Filled 2019-02-25 (×6): qty 1

## 2019-02-25 MED ORDER — FINASTERIDE 5 MG PO TABS
5.0000 mg | ORAL_TABLET | Freq: Two times a day (BID) | ORAL | Status: DC
  Administered 2019-02-25 – 2019-03-03 (×12): 5 mg via ORAL
  Filled 2019-02-25 (×13): qty 1

## 2019-02-25 MED ORDER — ASPIRIN EC 81 MG PO TBEC
81.0000 mg | DELAYED_RELEASE_TABLET | Freq: Every day | ORAL | Status: DC
  Administered 2019-02-26 – 2019-03-01 (×4): 81 mg via ORAL
  Filled 2019-02-25 (×4): qty 1

## 2019-02-25 MED ORDER — POTASSIUM CHLORIDE CRYS ER 20 MEQ PO TBCR
20.0000 meq | EXTENDED_RELEASE_TABLET | Freq: Every day | ORAL | Status: DC
  Administered 2019-02-26: 20 meq via ORAL
  Filled 2019-02-25: qty 1

## 2019-02-25 MED ORDER — SERTRALINE HCL 100 MG PO TABS
100.0000 mg | ORAL_TABLET | Freq: Two times a day (BID) | ORAL | Status: DC
  Administered 2019-02-25 – 2019-03-03 (×12): 100 mg via ORAL
  Filled 2019-02-25 (×13): qty 1

## 2019-02-25 MED ORDER — ISOSORBIDE MONONITRATE ER 60 MG PO TB24
60.0000 mg | ORAL_TABLET | Freq: Every day | ORAL | Status: DC
  Administered 2019-02-26 – 2019-03-03 (×6): 60 mg via ORAL
  Filled 2019-02-25 (×6): qty 1

## 2019-02-25 MED ORDER — FENTANYL CITRATE (PF) 100 MCG/2ML IJ SOLN
50.0000 ug | Freq: Once | INTRAMUSCULAR | Status: AC
  Administered 2019-02-25: 50 ug via INTRAVENOUS
  Filled 2019-02-25: qty 2

## 2019-02-25 MED ORDER — APIXABAN 2.5 MG PO TABS
2.5000 mg | ORAL_TABLET | Freq: Two times a day (BID) | ORAL | Status: DC
  Administered 2019-02-25 – 2019-03-03 (×12): 2.5 mg via ORAL
  Filled 2019-02-25 (×13): qty 1

## 2019-02-25 MED ORDER — SODIUM CHLORIDE 0.9 % IV SOLN
500.0000 mg | INTRAVENOUS | Status: DC
  Administered 2019-02-25 – 2019-03-02 (×5): 500 mg via INTRAVENOUS
  Filled 2019-02-25 (×7): qty 500

## 2019-02-25 NOTE — ED Notes (Signed)
[name] (daughter) [phone] looking for an update on the patient.

## 2019-02-25 NOTE — ED Triage Notes (Signed)
Pt brought in by EMS from home for mechanical fall yesterday. Per EMS pt lives alone, fell yesterday, but was not found until today by family. Per EMS pt has hx of CVA, MI, and afib. Pt in afib on ECG, per EMS pt takes eliquis. Per EMS pt lung sounds are diminished bilaterally, room air 70% on scene, placed on nonrebreather. Pt given 160mcg fentanyl PTA. Per EMS pt has possible right hip fracture and "several skin tears".

## 2019-02-25 NOTE — H&P (Signed)
History and Physical   [name] QMV:[id number] DOB: 1940-11-14 DOA: 02/25/2019  Referring MD/NP/PA: Dr. Bobby Rumpf  PCP: Marton Redwood, MD   Outpatient Specialists: None  Patient coming from: Home Chief Complaint: Fall  HPI: [name] is a 79 y.o. male with medical history significant of atrial fibrillation, bradycardia, anxiety disorder, carotid artery occlusion, COPD, hypertension, GERD, morbid obesity among other things who was brought in from home after sustaining a fall since yesterday.  Patient apparently has been having frequent urination.  He went to the bathroom when he slipped and fell since yesterday.  He was all by himself at home.  Family went in to see him today and found out that he is on the floor.  His oxygen sats were 70%.  He is placed on oxygen and brought into the ER.  Patient is complained of right hip pain mainly.  All imaging showed no obvious fracture.  He is now complaining of low back pain.  He has had previous CVAs and known coronary artery disease.  At this point work-up showed pneumonia but COVID-19 screen has been negative.  Patient also has generalized debility.  He is being admitted to the hospital for further work-up and treatment..  ED Course: Temperature 97.1 blood pressure 157/68 pulse 100 respirate 22 oxygen sat 95% on room air.  White count 17.3 hemoglobin 12.2 platelet count of 246.  Sodium 141 potassium 5.2 chloride 104 CO2 24 BUN 29 creatinine 1.72 calcium 9.3 and glucose 122.  CK 6094 BNP 157 troponin XX 8.  CT cervical spine head and right hip were negative.  Chest x-ray showed mild left upper lobe opacity concerning for possible pneumonia.  Patient is being admitted to the hospital for treatment  Review of Systems: As per HPI otherwise 10 point review of systems negative.    Past Medical History:  Diagnosis Date  . Anxiety   . Atrial fibrillation ([place])   . Bradycardia   . Carotid artery occlusion   . Cataract    bilataeral cateracts  removed  . CKD (chronic kidney disease), stage III   . Colon polyps   . Common bile duct stone   . COPD (chronic obstructive pulmonary disease) ([place])   . 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 CVA (cerebral vascular accident) ([place])   . Depression   . DVT (deep venous thrombosis) ([place])   . Dyslipidemia   . Essential hypertension   . Foot pain   . GERD (gastroesophageal reflux disease)   . History of benign prostatic hypertrophy   . History of leukocytosis   . Hypertension   . MI, old   . Neuromuscular disorder ([facility])    neuropathy  . Neuropathy   . Peripheral vascular disease ([place])   . Renal failure, chronic    stage 111  . Tobacco abuse     Past Surgical History:  Procedure Laterality Date  . ANKLE SURGERY    . BYPASS GRAFT  2009 or  2010   stent  . CHOLECYSTECTOMY     Gall Bladder  . COLONOSCOPY    . intervention of the RCA     using a single BMS     reports that he has been smoking cigarettes. He has been smoking about 1.00 pack per day. He uses smokeless tobacco. He reports that he does not drink alcohol or use drugs.  Allergies  Allergen Reactions  . Hctz [Hydrochlorothiazide] Anaphylaxis and Other (See Comments)  Throat Swelling  . Amitriptyline Other (See Comments)    Made me go crazy  . Gabapentin Other (See Comments)    Made me go crazy  . Metoprolol Tartrate Other (See Comments)    fatigue  . Paroxetine Other (See Comments)    Made me go crazy  . Spiriva Handihaler [Tiotropium Bromide Monohydrate] Other (See Comments)    Urinary retention    Family History  Problem Relation Age of Onset  . Stroke Mother   . Hypertension Mother   . Heart attack Mother   . Heart attack Brother   . Cancer Brother   . Hypertension Father   . Heart attack Father   . CVA Father   . Heart disease Brother   . Hypertension Brother   . AAA (abdominal aortic aneurysm) Brother   . Heart disease Brother   . Colon cancer  Neg Hx   . Esophageal cancer Neg Hx   . Rectal cancer Neg Hx   . Stomach cancer Neg Hx      Prior to Admission medications   Medication Sig Start Date End Date Taking? Authorizing Provider  amLODipine (NORVASC) 5 MG tablet Take 5 mg by mouth daily. 11/22/18   [provider]  apixaban (ELIQUIS) 2.5 MG TABS tablet Take 1 tablet (2.5 mg total) by mouth 2 (two) times daily. 12/29/18   Leanor Kail, PA  aspirin EC 81 MG tablet Take 81 mg by mouth daily.    [provider]  atorvastatin (LIPITOR) 40 MG tablet Take 40 mg by mouth at bedtime. 11/22/18   [provider]  diazepam (VALIUM) 2 MG tablet Take 2 mg by mouth 4 (four) times daily as needed. 12/06/18   [provider]  finasteride (PROSCAR) 5 MG tablet Take 5 mg by mouth 2 (two) times daily.  08/17/12   [provider]  furosemide (LASIX) 20 MG tablet Take 20 mg by mouth 2 (two) times daily. 07/11/13   [provider]  HYDROcodone-acetaminophen (NORCO/VICODIN) 5-325 MG tablet Take 1 tablet by mouth every 6 (six) hours as needed. 11/28/18   [provider]  isosorbide mononitrate (IMDUR) 60 MG 24 hr tablet Take 1 tablet (60 mg total) by mouth daily. 12/28/18   Bhagat, Crista Luria, PA  lubiprostone (AMITIZA) 8 MCG capsule Take 8 mcg by mouth 2 (two) times daily with a meal.    [provider]  Melatonin 5 MG CAPS Take 5 mg by mouth at bedtime.    [provider]  mirtazapine (REMERON) 15 MG tablet Take 7.5 mg by mouth at bedtime. 12/18/18   [provider]  nitroGLYCERIN (NITROSTAT) 0.4 MG SL tablet Place 1 tablet (0.4 mg total) under the tongue every 5 (five) minutes as needed for chest pain. 12/26/18   Bhagat, Crista Luria, PA  omeprazole (PRILOSEC) 40 MG capsule Take 1 capsule (40 mg total) by mouth daily. 03/13/14   Pyrtle, Lajuan Lines, MD  potassium chloride SA (KLOR-CON) 20 MEQ tablet Take 20 mEq by mouth daily.    [provider]  sertraline  (ZOLOFT) 100 MG tablet Take 100 mg by mouth 2 (two) times daily.  03/13/15   [provider]    Physical Exam: Vitals:   02/25/19 1423 02/25/19 1730  BP: (!) 134/118 (!) 157/68  Resp: (!) 22 (!) 22  Temp: 97.7 F (36.5 C)   TempSrc: Oral   SpO2: 96%       Constitutional: Chronically ill looking, weak, withdrawn Vitals:   02/25/19 1423 02/25/19  1730  BP: (!) 134/118 (!) 157/68  Resp: (!) 22 (!) 22  Temp: 97.7 F (36.5 C)   TempSrc: Oral   SpO2: 96%    Eyes: PERRL, lids and conjunctivae normal ENMT: Mucous membranes are dry. Posterior pharynx clear of any exudate or lesions.Normal dentition.  Neck: normal, supple, no masses, no thyromegaly Respiratory: clear to auscultation bilaterally, no wheezing, bilateral rhonchi and crackles. Normal respiratory effort. No accessory muscle use.  Cardiovascular: Irregularly irregular rate and rhythm, no murmurs / rubs / gallops. No extremity edema. 2+ pedal pulses. No carotid bruits.  Abdomen: no tenderness, no masses palpated. No hepatosplenomegaly. Bowel sounds positive.  Musculoskeletal: no clubbing / cyanosis. No joint deformity upper and lower extremities. Good ROM, no contractures. Normal muscle tone.  Skin: no rashes, lesions, ulcers. No induration Neurologic: CN 2-12 grossly intact. Sensation intact, DTR normal. Strength 5/5 in all 4.  Psychiatric: Normal judgment and insight. Alert and oriented x 3. Normal mood.     Labs on Admission: I have personally reviewed following labs and imaging studies  CBC: Recent Labs  Lab 02/25/19 1615  WBC 17.3*  NEUTROABS 15.0*  HGB 12.2*  HCT 37.7*  MCV 84.9  PLT 979   Basic Metabolic Panel: Recent Labs  Lab 02/25/19 1615  NA 141  K 5.2*  CL 104  CO2 24  GLUCOSE 122*  BUN 29*  CREATININE 1.72*  CALCIUM 9.3   GFR: CrCl cannot be calculated (Unknown ideal weight.). Liver Function Tests: No results for input(s): AST, ALT, ALKPHOS, BILITOT, PROT, ALBUMIN in the last 168  hours. No results for input(s): LIPASE, AMYLASE in the last 168 hours. No results for input(s): AMMONIA in the last 168 hours. Coagulation Profile: Recent Labs  Lab 02/25/19 1615  INR 1.2   Cardiac Enzymes: Recent Labs  Lab 02/25/19 1615  CKTOTAL 6,094*   BNP (last 3 results) No results for input(s): PROBNP in the last 8760 hours. HbA1C: No results for input(s): HGBA1C in the last 72 hours. CBG: No results for input(s): GLUCAP in the last 168 hours. Lipid Profile: No results for input(s): CHOL, HDL, LDLCALC, TRIG, CHOLHDL, LDLDIRECT in the last 72 hours. Thyroid Function Tests: No results for input(s): TSH, T4TOTAL, FREET4, T3FREE, THYROIDAB in the last 72 hours. Anemia Panel: No results for input(s): VITAMINB12, FOLATE, FERRITIN, TIBC, IRON, RETICCTPCT in the last 72 hours. Urine analysis:    Component Value Date/Time   COLORURINE AMBER (A) 01/18/2018 0002   APPEARANCEUR TURBID (A) 01/18/2018 0002   LABSPEC 1.028 01/18/2018 0002   PHURINE 5.0 01/18/2018 0002   GLUCOSEU NEGATIVE 01/18/2018 0002   HGBUR MODERATE (A) 01/18/2018 0002   BILIRUBINUR NEGATIVE 01/18/2018 0002   KETONESUR NEGATIVE 01/18/2018 0002   PROTEINUR 100 (A) 01/18/2018 0002   NITRITE NEGATIVE 01/18/2018 0002   LEUKOCYTESUR LARGE (A) 01/18/2018 0002   Sepsis Labs: @LABRCNTIP (procalcitonin:4,lacticidven:4) ) Recent Results (from the past 240 hour(s))  Respiratory Panel by RT PCR (Flu A&B, Covid) - Nasopharyngeal Swab     Status: None   Collection Time: 02/25/19  2:24 PM   Specimen: Nasopharyngeal Swab  Result Value Ref Range Status   SARS Coronavirus 2 by RT PCR NEGATIVE NEGATIVE Final    Comment: (NOTE) SARS-CoV-2 target nucleic acids are NOT DETECTED. The SARS-CoV-2 RNA is generally detectable in upper respiratoy specimens during the acute phase of infection. The lowest concentration of SARS-CoV-2 viral copies this assay can detect is 131 copies/mL. A negative result does not preclude  SARS-Cov-2 infection and should not be  used as the sole basis for treatment or other patient management decisions. A negative result may occur with  improper specimen collection/handling, submission of specimen other than nasopharyngeal swab, presence of viral mutation(s) within the areas targeted by this assay, and inadequate number of viral copies (<131 copies/mL). A negative result must be combined with clinical observations, patient history, and epidemiological information. The expected result is Negative. Fact Sheet for Patients:  PinkCheek.be Fact Sheet for Healthcare Providers:  GravelBags.it This test is not yet ap proved or cleared by the [place] FDA and  has been authorized for detection and/or diagnosis of SARS-CoV-2 by FDA under an Emergency Use Authorization (EUA). This EUA will remain  in effect (meaning this test can be used) for the duration of the COVID-19 declaration under Section 564(b)(1) of the Act, 21 U.S.C. section 360bbb-3(b)(1), unless the authorization is terminated or revoked sooner.    Influenza A by PCR NEGATIVE NEGATIVE Final   Influenza B by PCR NEGATIVE NEGATIVE Final    Comment: (NOTE) The Xpert Xpress SARS-CoV-2/FLU/RSV assay is intended as an aid in  the diagnosis of influenza from Nasopharyngeal swab specimens and  should not be used as a sole basis for treatment. Nasal washings and  aspirates are unacceptable for Xpert Xpress SARS-CoV-2/FLU/RSV  testing. Fact Sheet for Patients: PinkCheek.be Fact Sheet for Healthcare Providers: GravelBags.it This test is not yet approved or cleared by the [place] FDA and  has been authorized for detection and/or diagnosis of SARS-CoV-2 by  FDA under an Emergency Use Authorization (EUA). This EUA will remain  in effect (meaning this test can be used) for the duration of the  Covid-19  declaration under Section 564(b)(1) of the Act, 21  U.S.C. section 360bbb-3(b)(1), unless the authorization is  terminated or revoked. Performed at [facility], [place] [address] [place], [place] [address]      Radiological Exams on Admission: DG Lumbar Spine Complete  Result Date: 02/25/2019 CLINICAL DATA:  Right hip pain.  Fall yesterday. EXAM: LUMBAR SPINE - COMPLETE 4+ VIEW COMPARISON:  CT of hip of earlier today. FINDINGS: Atherosclerosis with right common iliac stent. Five lumbar type vertebral bodies. Sacroiliac joints are symmetric. Cholecystectomy. Mild osteopenia. Maintenance of vertebral body height and alignment. Mild spondylosis for age, with facet arthropathy at L4-5 and L5-S1. IMPRESSION: No acute osseous abnormality. Electronically Signed   By: Abigail Miyamoto M.D.   On: 02/25/2019 20:05   CT Head Wo Contrast  Result Date: 02/25/2019 CLINICAL DATA:  Recent fall EXAM: CT HEAD WITHOUT CONTRAST CT CERVICAL SPINE WITHOUT CONTRAST TECHNIQUE: Multidetector CT imaging of the head and cervical spine was performed following the standard protocol without intravenous contrast. Multiplanar CT image reconstructions of the cervical spine were also generated. COMPARISON:  None. FINDINGS: CT HEAD FINDINGS Brain: Atrophic changes are noted. Lacunar infarct is noted extending from the basal ganglion to the deep white matter on the left. No findings to suggest acute hemorrhage, acute infarction or space-occupying mass lesion are noted. Vascular: No hyperdense vessel or unexpected calcification. Skull: Normal. Negative for fracture or focal lesion. Sinuses/Orbits: No acute finding. Other: None. CT CERVICAL SPINE FINDINGS Alignment: Within normal limits. Skull base and vertebrae: 7 cervical segments are well visualized. Vertebral body height is well maintained. No acute fracture or acute facet abnormality is noted. No sizable facet hypertrophic changes are seen. The odontoid is within normal limits. Soft  tissues and spinal canal: Surrounding soft tissue structures demonstrate vascular calcifications. No focal hematoma is seen. Upper chest:  Visualized lung apices demonstrate patchy ground-glass infiltrates suspicious for underlying viral pneumonia. Clinical correlation is recommended. Other: None IMPRESSION: CT of the head: Chronic atrophic and ischemic changes without acute abnormality. CT of the cervical spine: Multilevel degenerative change without acute bony abnormality. Biapical ground-glass infiltrates suspicious for underlying viral pneumonia. Electronically Signed   By: Inez Catalina M.D.   On: 02/25/2019 18:04   CT Cervical Spine Wo Contrast  Result Date: 02/25/2019 CLINICAL DATA:  Recent fall EXAM: CT HEAD WITHOUT CONTRAST CT CERVICAL SPINE WITHOUT CONTRAST TECHNIQUE: Multidetector CT imaging of the head and cervical spine was performed following the standard protocol without intravenous contrast. Multiplanar CT image reconstructions of the cervical spine were also generated. COMPARISON:  None. FINDINGS: CT HEAD FINDINGS Brain: Atrophic changes are noted. Lacunar infarct is noted extending from the basal ganglion to the deep white matter on the left. No findings to suggest acute hemorrhage, acute infarction or space-occupying mass lesion are noted. Vascular: No hyperdense vessel or unexpected calcification. Skull: Normal. Negative for fracture or focal lesion. Sinuses/Orbits: No acute finding. Other: None. CT CERVICAL SPINE FINDINGS Alignment: Within normal limits. Skull base and vertebrae: 7 cervical segments are well visualized. Vertebral body height is well maintained. No acute fracture or acute facet abnormality is noted. No sizable facet hypertrophic changes are seen. The odontoid is within normal limits. Soft tissues and spinal canal: Surrounding soft tissue structures demonstrate vascular calcifications. No focal hematoma is seen. Upper chest: Visualized lung apices demonstrate patchy ground-glass  infiltrates suspicious for underlying viral pneumonia. Clinical correlation is recommended. Other: None IMPRESSION: CT of the head: Chronic atrophic and ischemic changes without acute abnormality. CT of the cervical spine: Multilevel degenerative change without acute bony abnormality. Biapical ground-glass infiltrates suspicious for underlying viral pneumonia. Electronically Signed   By: Inez Catalina M.D.   On: 02/25/2019 18:04   CT Hip Right Wo Contrast  Result Date: 02/25/2019 CLINICAL DATA:  Fatty yesterday with hip pain, initial encounter EXAM: CT OF THE RIGHT HIP WITHOUT CONTRAST TECHNIQUE: Multidetector CT imaging of the right hip was performed according to the standard protocol. Multiplanar CT image reconstructions were also generated. COMPARISON:  Plain film from earlier in the same day. FINDINGS: Bones/Joint/Cartilage No acute fracture or dislocation is noted. Mild degenerative changes are noted in the lower lumbar spine. No acute bony abnormality is seen. Ligaments Suboptimally assessed by CT. Muscles and Tendons Surrounding musculature is within normal limits. Soft tissues No joint effusion is seen. The visualized pelvic structures appear within normal limits. IMPRESSION: No acute fracture is noted. Electronically Signed   By: Inez Catalina M.D.   On: 02/25/2019 18:17   DG Chest Port 1 View  Result Date: 02/25/2019 CLINICAL DATA:  Shortness of breath. EXAM: PORTABLE CHEST 1 VIEW COMPARISON:  February 21, 2016. FINDINGS: The heart size and mediastinal contours are within normal limits. No pneumothorax or pleural effusion is noted. Atherosclerosis of thoracic aorta is noted. Right lung is clear. Mild left upper lobe opacity is noted concerning for possible pneumonia. The visualized skeletal structures are unremarkable. IMPRESSION: Aortic atherosclerosis. Mild left upper lobe opacity is noted concerning for possible pneumonia. Electronically Signed   By: Marijo Conception M.D.   On: 02/25/2019 15:44    DG Hip Unilat With Pelvis 2-3 Views Right  Result Date: 02/25/2019 CLINICAL DATA:  Recent fall, found down EXAM: DG HIP (WITH OR WITHOUT PELVIS) 2-3V RIGHT COMPARISON:  10/07/2006 FINDINGS: Bones are osteopenic and degenerative changes noted of the lower lumbar spine, SI  joints and both hips. Hips appear symmetric and intact. No displaced fracture or malalignment. Bony pelvis intact. Cross-table lateral view is limited because of overlying soft tissues. Aortoiliac and femoral atherosclerosis noted. IMPRESSION: Degenerative changes and osteopenia. No definite acute osseous finding by plain radiography Atherosclerosis Electronically Signed   By: Jerilynn Mages.  Shick M.D.   On: 02/25/2019 15:43      Assessment/Plan Principal Problem:   CAP (community acquired pneumonia) Active Problems:   HLD (hyperlipidemia)   Anxiety state   TOBACCO ABUSE   Essential hypertension   Coronary atherosclerosis   GASTROESOPHAGEAL REFLUX DISEASE     #1 community-acquired pneumonia: Based on x-ray finding.  Patient will be admitted and treated with IV Rocephin and Zithromax for community-acquired pneumonia.  Oxygen will be initiated and IV fluids.  Supportive care.  COVID-19 is negative.  #2 essential hypertension: Continue blood pressure management.  #3 coronary artery disease: Stable no decompensation  #4 morbid obesity: Dietary counseling.  #5 status post fall: Mechanical in nature.  Patient will need physical therapy and Occupational Therapy.  It appears he has some gait abnormalities.  #6 hyperlipidemia: Continue with statin.  #7 tobacco abuse: Tobacco cessation counseling and nicotine patch.  #8 anxiety disorder: Continue with home regiment.  #9 rhabdomyolysis: CPK of more than 6000.  Most likely as a result of fall and muscle breakdown.  Hold Lasix and give some fluids.  Later restart Lasix once patient is adequately hydrated.   DVT prophylaxis: Eliquis Code Status: Full code Family Communication: No  family at bedside Disposition Plan: To be determined Consults called: None Admission status: Inpatient  Severity of Illness: The appropriate patient status for this patient is INPATIENT. Inpatient status is judged to be reasonable and necessary in order to provide the required intensity of service to ensure the patient's safety. The patient's presenting symptoms, physical exam findings, and initial radiographic and laboratory data in the context of their chronic comorbidities is felt to place them at high risk for further clinical deterioration. Furthermore, it is not anticipated that the patient will be medically stable for discharge from the hospital within 2 midnights of admission. The following factors support the patient status of inpatient.   " The patient's presenting symptoms include fall with shortness of breath. " The worrisome physical exam findings include coarse bursa bilaterally. " The initial radiographic and laboratory data are worrisome because of chest x-ray findings of pneumonia with elevated CPK. " The chronic co-morbidities include hypertension.   * I certify that at the point of admission it is my clinical judgment that the patient will require inpatient hospital care spanning beyond 2 midnights from the point of admission due to high intensity of service, high risk for further deterioration and high frequency of surveillance required.Barbette Merino MD [facility] Pager [phone]  If 7PM-7AM, please contact night-coverage [email or web address] Password [email or web address]  02/25/2019, 8:25 PM

## 2019-02-25 NOTE — ED Provider Notes (Signed)
[facility] EMERGENCY DEPARTMENT Provider Note   CSN: [id number] Arrival date & time: 02/25/19  1407     History Chief Complaint  Patient presents with  . Fall  . Weakness  . Hip Pain    MAHONRI SEIDEN is a 79 y.o. male brought in by EMS for fall.  Patient states that he has to urinate frequently but he has to stand up to urinate into urinal at night.  He states that he fell sometime last evening and has been unable to get off of floor.  Family went to see him and found him on the floor.  They are unsure of how long he was down.  Is complaining of pain in his right hip.  He denies hitting his head or losing consciousness.  Patient is on Eliquis.  He was found to be in A. fib with RVR, have some difficulty breathing and hypoxia into the mid 80s by EMS.  HPI     Past Medical History:  Diagnosis Date  . Anxiety   . Atrial fibrillation ([place])   . Bradycardia   . Carotid artery occlusion   . Cataract    bilataeral cateracts removed  . CKD (chronic kidney disease), stage III   . Colon polyps   . Common bile duct stone   . COPD (chronic obstructive pulmonary disease) ([place])   . 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 CVA (cerebral vascular accident) ([place])   . Depression   . DVT (deep venous thrombosis) ([place])   . Dyslipidemia   . Essential hypertension   . Foot pain   . GERD (gastroesophageal reflux disease)   . History of benign prostatic hypertrophy   . History of leukocytosis   . Hypertension   . MI, old   . Neuromuscular disorder (HCC)    neuropathy  . Neuropathy   . Peripheral vascular disease ([place])   . Renal failure, chronic    stage 111  . Tobacco abuse     Patient Active Problem List   Diagnosis Date Noted  . Chest pain, rule out acute myocardial infarction   . Chest pain 03/21/2015  . Dysphagia 03/21/2015  . CKD (chronic kidney disease), stage III   . CN (constipation) 11/13/2014  .  Dysphagia, pharyngoesophageal phase 11/13/2014  . Coronary artery disease involving native coronary artery of native heart without angina pectoris 07/31/2014  . Chronic atrial fibrillation ([place]) 07/31/2014  . Adenomatous colon polyp 03/01/2014  . Swelling of limb- Right Leg > Left leg 09/13/2013  . Carotid stenosis 09/08/2012  . HLD (hyperlipidemia) 09/03/2008  . LEUKOCYTOSIS 09/03/2008  . Anxiety state 09/03/2008  . TOBACCO ABUSE 09/03/2008  . NEUROPATHY 09/03/2008  . Essential hypertension 09/03/2008  . Coronary atherosclerosis 09/03/2008  . BRADYCARDIA 09/03/2008  . CVA 09/03/2008  . GASTROESOPHAGEAL REFLUX DISEASE 09/03/2008  . BPH (benign prostatic hyperplasia) 09/03/2008    Past Surgical History:  Procedure Laterality Date  . ANKLE SURGERY    . BYPASS GRAFT  2009 or  2010   stent  . CHOLECYSTECTOMY     Gall Bladder  . COLONOSCOPY    . intervention of the RCA     using a single BMS       Family History  Problem Relation Age of Onset  . Stroke Mother   . Hypertension Mother   . Heart attack Mother   . Heart attack Brother   . Cancer Brother   .  Hypertension Father   . Heart attack Father   . CVA Father   . Heart disease Brother   . Hypertension Brother   . AAA (abdominal aortic aneurysm) Brother   . Heart disease Brother   . Colon cancer Neg Hx   . Esophageal cancer Neg Hx   . Rectal cancer Neg Hx   . Stomach cancer Neg Hx     Social History   Tobacco Use  . Smoking status: Current Every Day Smoker    Packs/day: 1.00    Types: Cigarettes  . Smokeless tobacco: Current User    Last attempt to quit: 09/13/2012  . Tobacco comment: currently using e-cigarettes and regular cigarettes.  Substance Use Topics  . Alcohol use: No    Alcohol/week: 0.0 standard drinks  . Drug use: No    Home Medications Prior to Admission medications   Medication Sig Start Date End Date Taking? Authorizing Provider  amLODipine (NORVASC) 5 MG tablet Take 5 mg by mouth  daily. 11/22/18   [provider]  apixaban (ELIQUIS) 2.5 MG TABS tablet Take 1 tablet (2.5 mg total) by mouth 2 (two) times daily. 12/29/18   Leanor Kail, PA  aspirin EC 81 MG tablet Take 81 mg by mouth daily.    [provider]  atorvastatin (LIPITOR) 40 MG tablet Take 40 mg by mouth at bedtime. 11/22/18   [provider]  diazepam (VALIUM) 2 MG tablet Take 2 mg by mouth 4 (four) times daily as needed. 12/06/18   [provider]  finasteride (PROSCAR) 5 MG tablet Take 5 mg by mouth 2 (two) times daily.  08/17/12   [provider]  furosemide (LASIX) 20 MG tablet Take 20 mg by mouth 2 (two) times daily. 07/11/13   [provider]  HYDROcodone-acetaminophen (NORCO/VICODIN) 5-325 MG tablet Take 1 tablet by mouth every 6 (six) hours as needed. 11/28/18   [provider]  isosorbide mononitrate (IMDUR) 60 MG 24 hr tablet Take 1 tablet (60 mg total) by mouth daily. 12/28/18   Bhagat, Crista Luria, PA  lubiprostone (AMITIZA) 8 MCG capsule Take 8 mcg by mouth 2 (two) times daily with a meal.    [provider]  Melatonin 5 MG CAPS Take 5 mg by mouth at bedtime.    [provider]  mirtazapine (REMERON) 15 MG tablet Take 7.5 mg by mouth at bedtime. 12/18/18   [provider]  nitroGLYCERIN (NITROSTAT) 0.4 MG SL tablet Place 1 tablet (0.4 mg total) under the tongue every 5 (five) minutes as needed for chest pain. 12/26/18   Bhagat, Crista Luria, PA  omeprazole (PRILOSEC) 40 MG capsule Take 1 capsule (40 mg total) by mouth daily. 03/13/14   Pyrtle, Lajuan Lines, MD  potassium chloride SA (KLOR-CON) 20 MEQ tablet Take 20 mEq by mouth daily.    [provider]  sertraline (ZOLOFT) 100 MG tablet Take 100 mg by mouth 2 (two) times daily.  03/13/15   [provider]    Allergies    Hctz [hydrochlorothiazide], Amitriptyline, Gabapentin, Metoprolol tartrate, Paroxetine, and Spiriva handihaler [tiotropium bromide  monohydrate]  Review of Systems   Review of Systems Ten systems reviewed and are negative for acute change, except as noted in the HPI.   Physical Exam Updated Vital Signs BP (!) 134/118 (BP Location: Left Arm)   Temp 97.7 F (36.5 C) (Oral)   Resp (!) 22   SpO2 96%   Physical Exam Vitals and nursing note reviewed.  Constitutional:  Appearance: He is obese. He is ill-appearing.  HENT:     Head: Normocephalic and atraumatic.     Mouth/Throat:     Mouth: Mucous membranes are dry.  Eyes:     Extraocular Movements: Extraocular movements intact.     Pupils: Pupils are equal, round, and reactive to light.  Cardiovascular:     Rate and Rhythm: Normal rate. Rhythm irregular.  Pulmonary:     Effort: Tachypnea present.     Breath sounds: Rhonchi present.  Musculoskeletal:     Cervical back: No tenderness.     Right hip: Tenderness present. No deformity. Decreased range of motion (Anterior hip bruising). Normal strength.     Left hip: No deformity, tenderness or bony tenderness. Normal range of motion.  Skin:      Neurological:     Mental Status: He is alert.     ED Results / Procedures / Treatments   Labs (all labs ordered are listed, but only abnormal results are displayed) Labs Reviewed  RESPIRATORY PANEL BY RT PCR (FLU A&B, COVID)  BASIC METABOLIC PANEL  CBC WITH DIFFERENTIAL/PLATELET  PROTIME-INR  BRAIN NATRIURETIC PEPTIDE  CK  TYPE AND SCREEN  TROPONIN I (HIGH SENSITIVITY)    EKG EKG Interpretation  Date/Time:  Saturday February 25 2019 14:22:21 EST Ventricular Rate:  87 PR Interval:    QRS Duration: 83 QT Interval:  392 QTC Calculation: 472 R Axis:   57 Text Interpretation: Atrial fibrillation Ventricular premature complex Abnormal R-wave progression, early transition Borderline T wave abnormalities Confirmed by Fredia Sorrow [phone]) on 02/25/2019 2:30:58 PM   Radiology No results found.  Procedures .Critical Care Performed by: Margarita Mail, PA-C Authorized by: Margarita Mail, PA-C   Critical care provider statement:    Critical care time (minutes):  50   Critical care time was exclusive of:  Separately billable procedures and treating other patients   Critical care was necessary to treat or prevent imminent or life-threatening deterioration of the following conditions:  Respiratory failure (Rhabdomyolysis)   Critical care was time spent personally by me on the following activities:  Discussions with consultants, evaluation of patient's response to treatment, examination of patient, ordering and performing treatments and interventions, ordering and review of laboratory studies, ordering and review of radiographic studies, pulse oximetry, re-evaluation of patient's condition, obtaining history from patient or surrogate and review of old charts   (including critical care time)  Medications Ordered in ED Medications - No data to display  ED Course  I have reviewed the triage vital signs and the nursing notes.  Pertinent labs & imaging results that were available during my care of the patient were reviewed by me and considered in my medical decision making (see chart for details).  Clinical Course as of Feb 24 2037  Sat Feb 25, 2019  1510 Patient hypoxic - now 94% on 3L   [AH]  1657 WBC(!): 17.3 [AH]  1657 NEUT#(!): 15.0 [AH]  1657 Lymphocyte #(!): 0.5 [AH]  1657 Abs Immature Granulocytes(!): 0.10 [AH]  1801 CK Total(!): 6,094 [AH]    Clinical Course User Index [AH] Margarita Mail, PA-C   MDM Rules/Calculators/A&P                      VE:LFYB, hypoxia VS:  Vitals:   02/25/19 1423 02/25/19 1730  BP: (!) 134/118 (!) 157/68  Resp: (!) 22 (!) 22  Temp: 97.7 F (36.5 C)   TempSrc: Oral   SpO2: 96%    OF:BPZWCHE  is gathered by Patient  and EMR. DDX: Hip fracture, pelvic fracture , lumbar fracture, head/ c-spine fracture, pneumothorax, COVID-19, pneumonia, pulmonary edema, BNP is slightly elevated.  White  blood cell count of 17,000 with left shift.  Covid, flu a and B-. Labs: I reviewed the labs which show elevated creatine kinase at 6094.  Potassium just above normal not requiring treatment.  Glucose slightly elevated.  BUN and creatinine elevated.  Mildly elevated troponin level of insignificant value. Imaging: I personally reviewed the images (lumbar spine film, CT hip, CT head, CT C-spine, and 2-3 view hip and pelvis plain film.) which show(s) all imaging shows no acute abnormality by my interpretation.  Patient has a 1 view chest x-ray which shows left upper lobe pneumonia on my interpretation. EKG: Rate controlled A. fib at a rate of 87 MDM: Patient with acute hypoxic respiratory failure secondary to pneumonia, fall without evidence of fracture or dislocations.  Patient also on the floor for an extended amount of time with rhabdomyolysis.  He is receiving IV fluids, and Patient disposition: Admit Patient condition: Stable. The patient appears reasonably stabilized for admission considering the current resources, flow, and capabilities available in the ED at this time, and I doubt any other [facility] requiring further screening and/or treatment in the ED prior to admission.   KYLIN DUBS was evaluated in Emergency Department on 02/25/2019 for the symptoms described in the history of present illness. He was evaluated in the context of the global COVID-19 pandemic, which necessitated consideration that the patient might be at risk for infection with the SARS-CoV-2 virus that causes COVID-19. Institutional protocols and algorithms that pertain to the evaluation of patients at risk for COVID-19 are in a state of rapid change based on information released by regulatory bodies including the CDC and federal and state organizations. These policies and algorithms were followed during the patient's care in the ED.  Final Clinical Impression(s) / ED Diagnoses Final diagnoses:  Acute respiratory failure with  hypoxia ([place])  Pneumonia of left upper lobe due to infectious organism  Traumatic rhabdomyolysis, initial encounter [facility])  Hip pain    Rx / DC Orders ED Discharge Orders    None       Margarita Mail, PA-C 02/25/19 2140    Fredia Sorrow, MD 03/17/19 1557

## 2019-02-26 ENCOUNTER — Inpatient Hospital Stay (HOSPITAL_COMMUNITY): Payer: Medicare Other

## 2019-02-26 DIAGNOSIS — T796XXD Traumatic ischemia of muscle, subsequent encounter: Secondary | ICD-10-CM

## 2019-02-26 DIAGNOSIS — E782 Mixed hyperlipidemia: Secondary | ICD-10-CM

## 2019-02-26 LAB — BASIC METABOLIC PANEL
Anion gap: 7 (ref 5–15)
BUN: 30 mg/dL — ABNORMAL HIGH (ref 8–23)
CO2: 22 mmol/L (ref 22–32)
Calcium: 8.4 mg/dL — ABNORMAL LOW (ref 8.9–10.3)
Chloride: 113 mmol/L — ABNORMAL HIGH (ref 98–111)
Creatinine, Ser: 1.61 mg/dL — ABNORMAL HIGH (ref 0.61–1.24)
GFR calc Af Amer: 46 mL/min — ABNORMAL LOW (ref >60)
GFR calc non Af Amer: 40 mL/min — ABNORMAL LOW (ref >60)
Glucose, Bld: 118 mg/dL — ABNORMAL HIGH (ref 70–99)
Potassium: 4.3 mmol/L (ref 3.5–5.1)
Sodium: 142 mmol/L (ref 135–145)

## 2019-02-26 LAB — CREATININE, SERUM
Creatinine, Ser: 1.75 mg/dL — ABNORMAL HIGH (ref 0.61–1.24)
GFR calc Af Amer: 42 mL/min — ABNORMAL LOW (ref >60)
GFR calc non Af Amer: 36 mL/min — ABNORMAL LOW (ref >60)

## 2019-02-26 LAB — CBC
HCT: 34.5 % — ABNORMAL LOW (ref 39.0–52.0)
Hemoglobin: 10.8 g/dL — ABNORMAL LOW (ref 13.0–17.0)
MCH: 26.5 pg (ref 26.0–34.0)
MCHC: 31.3 g/dL (ref 30.0–36.0)
MCV: 84.6 fL (ref 80.0–100.0)
Platelets: 234 10*3/uL (ref 150–400)
RBC: 4.08 MIL/uL — ABNORMAL LOW (ref 4.22–5.81)
RDW: 15.9 % — ABNORMAL HIGH (ref 11.5–15.5)
WBC: 13.4 10*3/uL — ABNORMAL HIGH (ref 4.0–10.5)
nRBC: 0 % (ref 0.0–0.2)

## 2019-02-26 LAB — TROPONIN I (HIGH SENSITIVITY): Troponin I (High Sensitivity): 28 ng/L — ABNORMAL HIGH

## 2019-02-26 NOTE — Progress Notes (Addendum)
PROGRESS NOTE  [name]:[id number] DOB: December 09, 1940 DOA: 02/25/2019 PCP: Marton Redwood, MD  HPI/Recap of past 24 hours: [name] is an 79 year old male with medical history significant for atrial fibrillation, bradycardia, anxiety, carotid artery occlusion, COPD, CVA, coronary artery disease hypertension, GERD, generalized debility and, morbid obesity, patient was admitted due to sustaining a fall on February 24, 2019 when he slipped off and fell at his bathroom due to his frequency of urination and had to get up to use the bathroom frequently at night and he was by himself and he was not found on the floor until the next day when family went to check on him.  His oxygen saturation was 70 on arrival to the ED he had complained of right hip pain from the fall all imaging studies showed no obvious fracture.  His Covid 19 was negative but his chest x-ray was showing mild left upper lobe pneumonia .  He also had a CT scan of the cervical spine and the hip that were negative checks x-ray as already stated above, his blood pressure was elevated at 157/68, probably from him being in pain he was afebrile temperature 97.1 his white count was 17.3 and potassium was 5.2.  He was admitted for further work-up and treatment.   February 26, 2019: Subjective: Patient seen and examined at bedside he is complaining of left lower quadrant quadrant abdominal pain.  Assessment/Plan: Principal Problem:   CAP (community acquired pneumonia) Active Problems:   HLD (hyperlipidemia)   Anxiety state   TOBACCO ABUSE   Essential hypertension   Coronary atherosclerosis   GASTROESOPHAGEAL REFLUX DISEASE   Rhabdomyolysis  #1 community-acquired pneumonia based on x-ray patient is currently on IV antibiotics with Rocephin and Zithromax also will give IV fluid. His COVID-19 test was negative  2. Leukocytosis likely from community-acquired pneumonia continue antibiotics  3. Hyperkalemia. I will hold off his  potassium daily we'll recheck his potassium level today.  4. Rhabdomyolysis his CK was elevated on admission to over 6000 most likely from his fall and being on the ground for unknown time. He is receiving IV fluids his Lasix is on hold we'll recheck CK level and monitor his renal function  5. Status post mechanical fall patient will need physical and Occupational Therapy. It seems that he has some debility that is most likely affecting his gait.  6. Essential hypertension continue home meds  7. Hyperlipidemia continue statins  8. Tobacco abuse patient was counseled for tobacco cessation and he has been started on nicotine patch  9. Anxiety disorder continue home meds  10. Vague left lower quadrant tenderness 5 over order abdominal x-ray  11. He is troponin is slightly elevated we'll continue to monitor it could be from the fall muscle breakdown  12. Acute insufficiency. 9-week from dehydration and rhabdomyolysis his creatinine is 1.75 today. We'll continue IV hydration. His Lasix is on hold we'll continue to monitor renal function   Severity of Illness: The appropriate patient status for this patient is INPATIENT. Inpatient status is judged to be reasonable and necessary in order to provide the required intensity of service to ensure the patient's safety. The patient's presenting symptoms, physical exam findings, and initial radiographic and laboratory data in the context of their chronic comorbidities is felt to place them at high risk for further clinical deterioration. Furthermore, it is not anticipated that the patient will be medically stable for discharge from the hospital within 2 midnights of admission. The following factors support  the patient status of inpatient.   " The patient's presenting symptoms include hypoxia cough and  prolonged downtime from fall. fall. " The worrisome physical exam findings include pneumonia. " The initial radiographic and laboratory data are worrisome  because of pneumonia. " The chronic co-morbidities include chronic debility  * I certify that at the point of admission it is my clinical judgment that the patient will require inpatient hospital care spanning beyond 2 midnights from the point of admission due to high intensity of service, high risk for further deterioration and high frequency of surveillance required.*      DVT prophylaxis: Eliquis Code Status: Full code Family Communication: No family at bedside Disposition Plan: To be determined Consults called: None Admission status: Inpatient Procedures:  None  Antimicrobials:  Azithromycin IV  Ceftriaxone IV  Objective: Vitals:   02/26/19 0633 02/26/19 0930 02/26/19 0945 02/26/19 1055  BP: (!) 148/80   (!) 153/81  Pulse: (!) 103 (!) 110 (!) 107 (!) 110  Resp: 18 (!) 26  18  Temp:    99.1 F (37.3 C)  TempSrc:    Oral  SpO2: 93% 97% 94% 96%    Intake/Output Summary (Last 24 hours) at 02/26/2019 1113 Last data filed at 02/26/2019 1105 Gross per 24 hour  Intake 2901.03 ml  Output 750 ml  Net 2151.03 ml   There were no vitals filed for this visit. There is no height or weight on file to calculate BMI.  Exam:   General: 79 y.o. year-old male well developed well nourished in no acute distress.  Alert and oriented x3. Communicating well  Cardiovascular: Regular rate and rhythm with no rubs or gallops.  No thyromegaly or JVD noted.    Respiratory: Clear to auscultation with no wheezes or rales. Good inspiratory effort.  Abdomen: Soft moderate tenderness in the left lower quadrant nondistended with normal bowel sounds x4 quadrants.  Musculoskeletal: No lower extremity edema. 2/4 pulses in all 4 extremities.  Skin: No ulcerative lesions noted or rashes,  Psychiatry: Mood is appropriate for condition and setting appropriately communicating his needs speech is clear and interactive    Data Reviewed: CBC: Recent Labs  Lab 02/25/19 1615  WBC 17.3*  NEUTROABS  15.0*  HGB 12.2*  HCT 37.7*  MCV 84.9  PLT 096   Basic Metabolic Panel: Recent Labs  Lab 02/25/19 1615  NA 141  K 5.2*  CL 104  CO2 24  GLUCOSE 122*  BUN 29*  CREATININE 1.72*  CALCIUM 9.3   GFR: CrCl cannot be calculated (Unknown ideal weight.). Liver Function Tests: No results for input(s): AST, ALT, ALKPHOS, BILITOT, PROT, ALBUMIN in the last 168 hours. No results for input(s): LIPASE, AMYLASE in the last 168 hours. No results for input(s): AMMONIA in the last 168 hours. Coagulation Profile: Recent Labs  Lab 02/25/19 1615  INR 1.2   Cardiac Enzymes: Recent Labs  Lab 02/25/19 1615  CKTOTAL 6,094*   BNP (last 3 results) No results for input(s): PROBNP in the last 8760 hours. HbA1C: No results for input(s): HGBA1C in the last 72 hours. CBG: No results for input(s): GLUCAP in the last 168 hours. Lipid Profile: No results for input(s): CHOL, HDL, LDLCALC, TRIG, CHOLHDL, LDLDIRECT in the last 72 hours. Thyroid Function Tests: No results for input(s): TSH, T4TOTAL, FREET4, T3FREE, THYROIDAB in the last 72 hours. Anemia Panel: No results for input(s): VITAMINB12, FOLATE, FERRITIN, TIBC, IRON, RETICCTPCT in the last 72 hours. Urine analysis:    Component Value Date/Time  COLORURINE AMBER (A) 01/18/2018 0002   APPEARANCEUR TURBID (A) 01/18/2018 0002   LABSPEC 1.028 01/18/2018 0002   PHURINE 5.0 01/18/2018 0002   GLUCOSEU NEGATIVE 01/18/2018 0002   HGBUR MODERATE (A) 01/18/2018 0002   BILIRUBINUR NEGATIVE 01/18/2018 0002   KETONESUR NEGATIVE 01/18/2018 0002   PROTEINUR 100 (A) 01/18/2018 0002   NITRITE NEGATIVE 01/18/2018 0002   LEUKOCYTESUR LARGE (A) 01/18/2018 0002   Sepsis Labs: @LABRCNTIP (procalcitonin:4,lacticidven:4)  ) Recent Results (from the past 240 hour(s))  Respiratory Panel by RT PCR (Flu A&B, Covid) - Nasopharyngeal Swab     Status: None   Collection Time: 02/25/19  2:24 PM   Specimen: Nasopharyngeal Swab  Result Value Ref Range Status    SARS Coronavirus 2 by RT PCR NEGATIVE NEGATIVE Final    Comment: (NOTE) SARS-CoV-2 target nucleic acids are NOT DETECTED. The SARS-CoV-2 RNA is generally detectable in upper respiratoy specimens during the acute phase of infection. The lowest concentration of SARS-CoV-2 viral copies this assay can detect is 131 copies/mL. A negative result does not preclude SARS-Cov-2 infection and should not be used as the sole basis for treatment or other patient management decisions. A negative result may occur with  improper specimen collection/handling, submission of specimen other than nasopharyngeal swab, presence of viral mutation(s) within the areas targeted by this assay, and inadequate number of viral copies (<131 copies/mL). A negative result must be combined with clinical observations, patient history, and epidemiological information. The expected result is Negative. Fact Sheet for Patients:  PinkCheek.be Fact Sheet for Healthcare Providers:  GravelBags.it This test is not yet ap proved or cleared by the [place] FDA and  has been authorized for detection and/or diagnosis of SARS-CoV-2 by FDA under an Emergency Use Authorization (EUA). This EUA will remain  in effect (meaning this test can be used) for the duration of the COVID-19 declaration under Section 564(b)(1) of the Act, 21 U.S.C. section 360bbb-3(b)(1), unless the authorization is terminated or revoked sooner.    Influenza A by PCR NEGATIVE NEGATIVE Final   Influenza B by PCR NEGATIVE NEGATIVE Final    Comment: (NOTE) The Xpert Xpress SARS-CoV-2/FLU/RSV assay is intended as an aid in  the diagnosis of influenza from Nasopharyngeal swab specimens and  should not be used as a sole basis for treatment. Nasal washings and  aspirates are unacceptable for Xpert Xpress SARS-CoV-2/FLU/RSV  testing. Fact Sheet for Patients: PinkCheek.be Fact  Sheet for Healthcare Providers: GravelBags.it This test is not yet approved or cleared by the [place] FDA and  has been authorized for detection and/or diagnosis of SARS-CoV-2 by  FDA under an Emergency Use Authorization (EUA). This EUA will remain  in effect (meaning this test can be used) for the duration of the  Covid-19 declaration under Section 564(b)(1) of the Act, 21  U.S.C. section 360bbb-3(b)(1), unless the authorization is  terminated or revoked. Performed at [facility], [place] [address], [place], [place] [address]       Studies: DG Lumbar Spine Complete  Result Date: 02/25/2019 CLINICAL DATA:  Right hip pain.  Fall yesterday. EXAM: LUMBAR SPINE - COMPLETE 4+ VIEW COMPARISON:  CT of hip of earlier today. FINDINGS: Atherosclerosis with right common iliac stent. Five lumbar type vertebral bodies. Sacroiliac joints are symmetric. Cholecystectomy. Mild osteopenia. Maintenance of vertebral body height and alignment. Mild spondylosis for age, with facet arthropathy at L4-5 and L5-S1. IMPRESSION: No acute osseous abnormality. Electronically Signed   By: Abigail Miyamoto M.D.   On: 02/25/2019 20:05  CT Head Wo Contrast  Result Date: 02/25/2019 CLINICAL DATA:  Recent fall EXAM: CT HEAD WITHOUT CONTRAST CT CERVICAL SPINE WITHOUT CONTRAST TECHNIQUE: Multidetector CT imaging of the head and cervical spine was performed following the standard protocol without intravenous contrast. Multiplanar CT image reconstructions of the cervical spine were also generated. COMPARISON:  None. FINDINGS: CT HEAD FINDINGS Brain: Atrophic changes are noted. Lacunar infarct is noted extending from the basal ganglion to the deep white matter on the left. No findings to suggest acute hemorrhage, acute infarction or space-occupying mass lesion are noted. Vascular: No hyperdense vessel or unexpected calcification. Skull: Normal. Negative for fracture or focal lesion. Sinuses/Orbits: No  acute finding. Other: None. CT CERVICAL SPINE FINDINGS Alignment: Within normal limits. Skull base and vertebrae: 7 cervical segments are well visualized. Vertebral body height is well maintained. No acute fracture or acute facet abnormality is noted. No sizable facet hypertrophic changes are seen. The odontoid is within normal limits. Soft tissues and spinal canal: Surrounding soft tissue structures demonstrate vascular calcifications. No focal hematoma is seen. Upper chest: Visualized lung apices demonstrate patchy ground-glass infiltrates suspicious for underlying viral pneumonia. Clinical correlation is recommended. Other: None IMPRESSION: CT of the head: Chronic atrophic and ischemic changes without acute abnormality. CT of the cervical spine: Multilevel degenerative change without acute bony abnormality. Biapical ground-glass infiltrates suspicious for underlying viral pneumonia. Electronically Signed   By: Inez Catalina M.D.   On: 02/25/2019 18:04   CT Cervical Spine Wo Contrast  Result Date: 02/25/2019 CLINICAL DATA:  Recent fall EXAM: CT HEAD WITHOUT CONTRAST CT CERVICAL SPINE WITHOUT CONTRAST TECHNIQUE: Multidetector CT imaging of the head and cervical spine was performed following the standard protocol without intravenous contrast. Multiplanar CT image reconstructions of the cervical spine were also generated. COMPARISON:  None. FINDINGS: CT HEAD FINDINGS Brain: Atrophic changes are noted. Lacunar infarct is noted extending from the basal ganglion to the deep white matter on the left. No findings to suggest acute hemorrhage, acute infarction or space-occupying mass lesion are noted. Vascular: No hyperdense vessel or unexpected calcification. Skull: Normal. Negative for fracture or focal lesion. Sinuses/Orbits: No acute finding. Other: None. CT CERVICAL SPINE FINDINGS Alignment: Within normal limits. Skull base and vertebrae: 7 cervical segments are well visualized. Vertebral body height is well  maintained. No acute fracture or acute facet abnormality is noted. No sizable facet hypertrophic changes are seen. The odontoid is within normal limits. Soft tissues and spinal canal: Surrounding soft tissue structures demonstrate vascular calcifications. No focal hematoma is seen. Upper chest: Visualized lung apices demonstrate patchy ground-glass infiltrates suspicious for underlying viral pneumonia. Clinical correlation is recommended. Other: None IMPRESSION: CT of the head: Chronic atrophic and ischemic changes without acute abnormality. CT of the cervical spine: Multilevel degenerative change without acute bony abnormality. Biapical ground-glass infiltrates suspicious for underlying viral pneumonia. Electronically Signed   By: Inez Catalina M.D.   On: 02/25/2019 18:04   CT Hip Right Wo Contrast  Result Date: 02/25/2019 CLINICAL DATA:  Fatty yesterday with hip pain, initial encounter EXAM: CT OF THE RIGHT HIP WITHOUT CONTRAST TECHNIQUE: Multidetector CT imaging of the right hip was performed according to the standard protocol. Multiplanar CT image reconstructions were also generated. COMPARISON:  Plain film from earlier in the same day. FINDINGS: Bones/Joint/Cartilage No acute fracture or dislocation is noted. Mild degenerative changes are noted in the lower lumbar spine. No acute bony abnormality is seen. Ligaments Suboptimally assessed by CT. Muscles and Tendons Surrounding musculature is within normal limits. Soft  tissues No joint effusion is seen. The visualized pelvic structures appear within normal limits. IMPRESSION: No acute fracture is noted. Electronically Signed   By: Inez Catalina M.D.   On: 02/25/2019 18:17   DG Chest Port 1 View  Result Date: 02/25/2019 CLINICAL DATA:  Shortness of breath. EXAM: PORTABLE CHEST 1 VIEW COMPARISON:  February 21, 2016. FINDINGS: The heart size and mediastinal contours are within normal limits. No pneumothorax or pleural effusion is noted. Atherosclerosis of  thoracic aorta is noted. Right lung is clear. Mild left upper lobe opacity is noted concerning for possible pneumonia. The visualized skeletal structures are unremarkable. IMPRESSION: Aortic atherosclerosis. Mild left upper lobe opacity is noted concerning for possible pneumonia. Electronically Signed   By: Marijo Conception M.D.   On: 02/25/2019 15:44   DG Hip Unilat With Pelvis 2-3 Views Right  Result Date: 02/25/2019 CLINICAL DATA:  Recent fall, found down EXAM: DG HIP (WITH OR WITHOUT PELVIS) 2-3V RIGHT COMPARISON:  10/07/2006 FINDINGS: Bones are osteopenic and degenerative changes noted of the lower lumbar spine, SI joints and both hips. Hips appear symmetric and intact. No displaced fracture or malalignment. Bony pelvis intact. Cross-table lateral view is limited because of overlying soft tissues. Aortoiliac and femoral atherosclerosis noted. IMPRESSION: Degenerative changes and osteopenia. No definite acute osseous finding by plain radiography Atherosclerosis Electronically Signed   By: Jerilynn Mages.  Shick M.D.   On: 02/25/2019 15:43    Scheduled Meds:  amLODipine  5 mg Oral Daily   apixaban  2.5 mg Oral BID   aspirin EC  81 mg Oral Daily   atorvastatin  40 mg Oral QHS   finasteride  5 mg Oral BID   isosorbide mononitrate  60 mg Oral Daily   lubiprostone  8 mcg Oral BID WC   Melatonin  3 mg Oral QHS   mirtazapine  7.5 mg Oral QHS   pantoprazole  40 mg Oral Daily   potassium chloride SA  20 mEq Oral Daily   sertraline  100 mg Oral BID    Continuous Infusions:  sodium chloride 100 mL/hr at 02/26/19 1105   azithromycin Stopped (02/25/19 2303)   cefTRIAXone (ROCEPHIN)  IV Stopped (02/25/19 1929)     LOS: 1 day     Cristal Deer, MD [facility]  To reach me or the doctor on call, go to: [email or web address] Password [email or web address]  02/26/2019, 11:13 AM

## 2019-02-26 NOTE — Plan of Care (Signed)
  Problem: Clinical Measurements: Goal: Diagnostic test results will improve Outcome: Progressing   Problem: Nutrition: Goal: Adequate nutrition will be maintained Outcome: Progressing   

## 2019-02-26 NOTE — Progress Notes (Signed)
Not sure why pt is NPO. Pt does have medications due, messaged on call to verify.   Eleanora Neighbor, RN

## 2019-02-26 NOTE — ED Notes (Signed)
Attempted to give report to 62M x 1

## 2019-02-27 ENCOUNTER — Inpatient Hospital Stay (HOSPITAL_COMMUNITY): Payer: Medicare Other

## 2019-02-27 LAB — CK: Total CK: 1786 U/L — ABNORMAL HIGH (ref 49–397)

## 2019-02-27 LAB — RENAL FUNCTION PANEL
Albumin: 2.8 g/dL — ABNORMAL LOW (ref 3.5–5.0)
Anion gap: 10 (ref 5–15)
BUN: 28 mg/dL — ABNORMAL HIGH (ref 8–23)
CO2: 19 mmol/L — ABNORMAL LOW (ref 22–32)
Calcium: 8.7 mg/dL — ABNORMAL LOW (ref 8.9–10.3)
Chloride: 114 mmol/L — ABNORMAL HIGH (ref 98–111)
Creatinine, Ser: 1.49 mg/dL — ABNORMAL HIGH (ref 0.61–1.24)
GFR calc Af Amer: 51 mL/min — ABNORMAL LOW (ref >60)
GFR calc non Af Amer: 44 mL/min — ABNORMAL LOW (ref >60)
Glucose, Bld: 100 mg/dL — ABNORMAL HIGH (ref 70–99)
Phosphorus: 2 mg/dL — ABNORMAL LOW (ref 2.5–4.6)
Potassium: 4.2 mmol/L (ref 3.5–5.1)
Sodium: 143 mmol/L (ref 135–145)

## 2019-02-27 LAB — CBC
HCT: 34.4 % — ABNORMAL LOW (ref 39.0–52.0)
Hemoglobin: 10.7 g/dL — ABNORMAL LOW (ref 13.0–17.0)
MCH: 26.7 pg (ref 26.0–34.0)
MCHC: 31.1 g/dL (ref 30.0–36.0)
MCV: 85.8 fL (ref 80.0–100.0)
Platelets: 231 10*3/uL (ref 150–400)
RBC: 4.01 MIL/uL — ABNORMAL LOW (ref 4.22–5.81)
RDW: 16.2 % — ABNORMAL HIGH (ref 11.5–15.5)
WBC: 12.4 10*3/uL — ABNORMAL HIGH (ref 4.0–10.5)
nRBC: 0 % (ref 0.0–0.2)

## 2019-02-27 LAB — MAGNESIUM: Magnesium: 2.1 mg/dL (ref 1.7–2.4)

## 2019-02-27 MED ORDER — ORAL CARE MOUTH RINSE
15.0000 mL | Freq: Two times a day (BID) | OROMUCOSAL | Status: DC
  Administered 2019-02-27 – 2019-03-03 (×8): 15 mL via OROMUCOSAL

## 2019-02-27 MED ORDER — METOPROLOL TARTRATE 12.5 MG HALF TABLET
12.5000 mg | ORAL_TABLET | Freq: Two times a day (BID) | ORAL | Status: DC
  Administered 2019-02-27 – 2019-03-03 (×9): 12.5 mg via ORAL
  Filled 2019-02-27 (×9): qty 1

## 2019-02-27 NOTE — Evaluation (Addendum)
Physical Therapy Evaluation Patient Details Name: [name] MRN: [id number] DOB: 19-Dec-1940 Today's Date: 02/27/2019   History of Present Illness  79 yo male with onset of significant weakness and hypoxia was referred to PT for R hip pain.  Found to have PNA, hypoxia, cleared for fractures on R hip and pelvis.   PMHx:  PAF, GERD, DVT, CAD, CKD 3, stroke, bradycardia, lumbar spondylosis, R common iliac a stent, osteopenia, DJD c-spine, atherosclerosis  Clinical Impression  Pt is struggling with mobility despite his recent ability to walk at home, and is unable to get OOB to chair due to weakness and pain in L ribcage.  Recommending rehab to help pt return home and recover his independence with gait and transfers.  Follow acutely for these needs, with increased time to be up in chair and standing as able.    Follow Up Recommendations SNF    Equipment Recommendations  None recommended by PT    Recommendations for Other Services       Precautions / Restrictions Precautions Precautions: Fall Restrictions Weight Bearing Restrictions: No      Mobility  Bed Mobility Overal bed mobility: Needs Assistance Bed Mobility: Supine to Sit;Sit to Supine     Supine to sit: Max assist Sit to supine: Total assist      Transfers Overall transfer level: Needs assistance               General transfer comment: pt is unwilling to attempt  Ambulation/Gait                Stairs            Wheelchair Mobility    Modified Rankin (Stroke Patients Only)       Balance Overall balance assessment: Needs assistance Sitting-balance support: Feet supported;Bilateral upper extremity supported Sitting balance-Leahy Scale: Poor                                       Pertinent Vitals/Pain Pain Assessment: Faces Faces Pain Scale: Hurts even more Pain Location: L ribcage Pain Descriptors / Indicators: Aching Pain Intervention(s): Limited activity within  patient's tolerance;Monitored during session;Repositioned    Home Living Family/patient expects to be discharged to:: Unsure                      Prior Function Level of Independence: Independent               Hand Dominance   Dominant Hand: Right    Extremity/Trunk Assessment   Upper Extremity Assessment Upper Extremity Assessment: Generalized weakness    Lower Extremity Assessment Lower Extremity Assessment: Generalized weakness    Cervical / Trunk Assessment Cervical / Trunk Assessment: Kyphotic  Communication   Communication: No difficulties  Cognition Arousal/Alertness: Awake/alert Behavior During Therapy: Flat affect Overall Cognitive Status: No family/caregiver present to determine baseline cognitive functioning                                 General Comments: pt is not able to give a full history      General Comments General comments (skin integrity, edema, etc.): pt attempted to control his sitting balance     Exercises     Assessment/Plan    PT Assessment Patient needs continued PT services  PT Problem List Decreased strength;Decreased range of  motion;Decreased activity tolerance;Decreased balance;Decreased mobility;Decreased coordination;Decreased cognition;Decreased knowledge of use of DME;Decreased safety awareness;Cardiopulmonary status limiting activity;Pain;Decreased skin integrity;Obesity       PT Treatment Interventions DME instruction;Gait training;Functional mobility training;Therapeutic activities;Therapeutic exercise;Balance training;Neuromuscular re-education;Patient/family education    PT Goals (Current goals can be found in the Care Plan section)  Acute Rehab PT Goals Patient Stated Goal: to feel better, be out of pain PT Goal Formulation: With patient Time For Goal Achievement: 03/13/19 Potential to Achieve Goals: Fair    Frequency Min 2X/week   Barriers to discharge Inaccessible home  environment;Decreased caregiver support      Co-evaluation               AM-PAC PT "6 Clicks" Mobility  Outcome Measure Help needed turning from your back to your side while in a flat bed without using bedrails?: A Lot Help needed moving from lying on your back to sitting on the side of a flat bed without using bedrails?: A Lot Help needed moving to and from a bed to a chair (including a wheelchair)?: Total Help needed standing up from a chair using your arms (e.g., wheelchair or bedside chair)?: Total Help needed to walk in hospital room?: Total Help needed climbing 3-5 steps with a railing? : Total 6 Click Score: 8    End of Session Equipment Utilized During Treatment: Gait belt Activity Tolerance: Patient limited by fatigue;Treatment limited secondary to medical complications (Comment) Patient left: in bed;with call bell/phone within reach;with bed alarm set Nurse Communication: Mobility status PT Visit Diagnosis: Muscle weakness (generalized) (M62.81);Difficulty in walking, not elsewhere classified (R26.2);Adult, failure to thrive (R62.7);Pain Pain - Right/Left: Left Pain - part of body: (trunk)    Time: 3716-9678 PT Time Calculation (min) (ACUTE ONLY): 29 min   Charges:   PT Evaluation $PT Eval Moderate Complexity: 1 Mod PT Treatments $Therapeutic Activity: 8-22 mins       Ramond Dial 02/27/2019, 8:37 PM   Mee Hives, PT MS Acute Rehab Dept. Number: [place] [facility] [place]

## 2019-02-27 NOTE — Progress Notes (Signed)
Pt had 17 beats of v-tach. MD paged. Pt is not in any distress or discomfort. Denies SOB and pain.   Eleanora Neighbor, RN

## 2019-02-27 NOTE — Progress Notes (Signed)
Hospitalist progress note  [name] [id number] DOB: [birth year] DOA: 02/25/2019  PCP: Marton Redwood, MD   Narrative:  79 year old male COPD reflux depression CAD status post BMS RCA 2010+ PVD and right iliac artery stenting 2011, prior stroke X2 with dysphagia, CKD 3 atrial fibrillation on Eliquis prior DVT Admit 02/25/2019 status post fall O2 sats 70s complained of right hip pain Found to have pneumonia and admitted treatment for the same  Data Reviewed:  BUN/creatinine down from 30/4.6-28/1.4 bicarb 19 WBC 12.4 down from 13 hemoglobin 10.7 platelet 231 Abdominal x-ray 1/3 shows normal gas pattern  Assessment & Plan: Community-acquired pneumonia left upper lobe opacity White count still slightly elevated therefore continue IV antibiotics at this time Discontinue IV saline as is net +3.5 L Desat screen as does not use oxygen at home Get 2 view chest x-ray a.m. 1/5 Stroke x2 in the past [id number] complicated by dysphagia We will ask speech therapy to not emergently see given location of pneumonia Continue preventative Lipitor 40 daily apixaban 2.5 twice daily aspirin 81 mg Paroxysmal A. fib with prior DVT Eliquis as above He is slightly tachycardic this morning had some V. tach about 18 beats magnesium was normal starting metoprolol 12.5 twice daily monitor trends CAD status post BMS RCA 2010 with right iliac artery stenting 2011 Continue Imdur 60 daily, metoprolol as above, amlodipine 5 CKD 2-3 Given fluids earlier in admission-we will hold for now as above Reflux Continue pantoprazole 40 daily BPH Continue Proscar 5 twice daily  On Eliquis, DNR confirmed discussed with daughter [phone] antcipate d/c in the next 2-3 days  Subjective: Awake alert somewhat coherent no distress some abd pain in LLQ and perseverates on this No fever mid LE edema Consultants:   n Procedures:   n Antimicrobials:   Ceftriaxone and azithromycin    Objective: Vitals:   02/26/19 1055  02/26/19 2104 02/26/19 2105 02/27/19 0444  BP: (!) 153/81 136/63 136/63 (!) 142/83  Pulse: (!) 110 (!) 107 100 (!) 108  Resp: 18 (!) 22 (!) 22 18  Temp: 99.1 F (37.3 C) 98.6 F (37 C) 98.6 F (37 C) 98.4 F (36.9 C)  TempSrc: Oral Oral Oral Oral  SpO2: 96% 95% 96% 95%  Weight:   122.5 kg     Intake/Output Summary (Last 24 hours) at 02/27/2019 0752 Last data filed at 02/27/2019 0350 Gross per 24 hour  Intake 3085.64 ml  Output 600 ml  Net 2485.64 ml   Filed Weights   02/26/19 2105  Weight: 122.5 kg    Examination: Awake alert in and quite weak  Crackles in post lat lngg fields  abd tender in LLQ Neuro intact in no focal deficit at this time  Scheduled Meds: . amLODipine  5 mg Oral Daily  . apixaban  2.5 mg Oral BID  . aspirin EC  81 mg Oral Daily  . atorvastatin  40 mg Oral QHS  . finasteride  5 mg Oral BID  . isosorbide mononitrate  60 mg Oral Daily  . lubiprostone  8 mcg Oral BID WC  . Melatonin  3 mg Oral QHS  . metoprolol tartrate  12.5 mg Oral BID  . mirtazapine  7.5 mg Oral QHS  . pantoprazole  40 mg Oral Daily  . sertraline  100 mg Oral BID   Continuous Infusions: . sodium chloride 100 mL/hr at 02/26/19 2330  . azithromycin 500 mg (02/26/19 1816)  . cefTRIAXone (ROCEPHIN)  IV 2 g (02/26/19 2059)  LOS: 2 days   Time spent: [place], MD [facility]  02/27/2019, 7:52 AM

## 2019-02-27 NOTE — Discharge Instructions (Signed)

## 2019-02-28 LAB — CBC WITH DIFFERENTIAL/PLATELET
Abs Immature Granulocytes: 0.08 10*3/uL — ABNORMAL HIGH (ref 0.00–0.07)
Basophils Absolute: 0.1 10*3/uL (ref 0.0–0.1)
Basophils Relative: 1 %
Eosinophils Absolute: 0.1 10*3/uL (ref 0.0–0.5)
Eosinophils Relative: 1 %
HCT: 30.8 % — ABNORMAL LOW (ref 39.0–52.0)
Hemoglobin: 9.3 g/dL — ABNORMAL LOW (ref 13.0–17.0)
Immature Granulocytes: 1 %
Lymphocytes Relative: 9 %
Lymphs Abs: 1 10*3/uL (ref 0.7–4.0)
MCH: 26.4 pg (ref 26.0–34.0)
MCHC: 30.2 g/dL (ref 30.0–36.0)
MCV: 87.5 fL (ref 80.0–100.0)
Monocytes Absolute: 1.2 10*3/uL — ABNORMAL HIGH (ref 0.1–1.0)
Monocytes Relative: 12 %
Neutro Abs: 7.9 10*3/uL — ABNORMAL HIGH (ref 1.7–7.7)
Neutrophils Relative %: 76 %
Platelets: 213 10*3/uL (ref 150–400)
RBC: 3.52 MIL/uL — ABNORMAL LOW (ref 4.22–5.81)
RDW: 16.4 % — ABNORMAL HIGH (ref 11.5–15.5)
WBC: 10.4 10*3/uL (ref 4.0–10.5)
nRBC: 0 % (ref 0.0–0.2)

## 2019-02-28 LAB — COMPREHENSIVE METABOLIC PANEL
ALT: 42 U/L (ref 0–44)
AST: 74 U/L — ABNORMAL HIGH (ref 15–41)
Albumin: 2.6 g/dL — ABNORMAL LOW (ref 3.5–5.0)
Alkaline Phosphatase: 63 U/L (ref 38–126)
Anion gap: 9 (ref 5–15)
BUN: 29 mg/dL — ABNORMAL HIGH (ref 8–23)
CO2: 20 mmol/L — ABNORMAL LOW (ref 22–32)
Calcium: 8.7 mg/dL — ABNORMAL LOW (ref 8.9–10.3)
Chloride: 113 mmol/L — ABNORMAL HIGH (ref 98–111)
Creatinine, Ser: 1.57 mg/dL — ABNORMAL HIGH (ref 0.61–1.24)
GFR calc Af Amer: 48 mL/min — ABNORMAL LOW (ref >60)
GFR calc non Af Amer: 41 mL/min — ABNORMAL LOW (ref >60)
Glucose, Bld: 100 mg/dL — ABNORMAL HIGH (ref 70–99)
Potassium: 4.1 mmol/L (ref 3.5–5.1)
Sodium: 142 mmol/L (ref 135–145)
Total Bilirubin: 0.9 mg/dL (ref 0.3–1.2)
Total Protein: 6.4 g/dL — ABNORMAL LOW (ref 6.5–8.1)

## 2019-02-28 MED ORDER — FUROSEMIDE 10 MG/ML IJ SOLN
40.0000 mg | Freq: Once | INTRAMUSCULAR | Status: AC
  Administered 2019-02-28: 40 mg via INTRAVENOUS
  Filled 2019-02-28: qty 4

## 2019-02-28 NOTE — TOC Initial Note (Signed)
Transition of Care [facility]) - Initial/Assessment Note    Patient Details  Name: [name] MRN: [id number] Date of Birth: 1940/11/15  Transition of Care [facility]) CM/SW Contact:    Bartholomew Crews, RN Phone Number: [phone] 02/28/2019, 2:39 PM  Clinical Narrative:                 Spoke with patient at the bedside who is agreeable to rehab. Stated that he had been to [facility] in the past which was fine once they got him into a private room. Patient agreed to [facility] calling his daughter to discuss transition plans.   Spoke with [name] (daughter) at [phone]. [name] stated that patient had been to [facility] almost a year ago and that he loved it. She was in the process of making long term arrangements when the patient decided to go home. Patient later stated that he wished he had stayed, but pandemic had started and [name] did not know how to get him back to a facility.   [name] asked about [facility] in [place] since it is closer, but she understands the bed offer process and is agreeable to having patient information faxed. NCM provided contact information for additional questions.   PASRR number retrieved. FL2 completed and faxed. Will present bed offers when available. Noted in MD note that patient may be ready to discharge in 2-3 days. Patient will need updated covid for discharge.   TOC team following for transition needs.   Expected Discharge Plan: Skilled Nursing Facility Barriers to Discharge: Continued Medical Work up   Patient Goals and CMS Choice Patient states their goals for this hospitalization and ongoing recovery are:: skilled rehab CMS Medicare.gov Compare Post Acute Care list provided to:: Patient Choice offered to / list presented to : Adult Children, Patient  Expected Discharge Plan and Services Expected Discharge Plan: [place] In-house Referral: Clinical Social Work Discharge Planning Services: CM Consult Post Acute Care Choice:  [place] arrangements for the past 2 months: Single Family Home                 DME Arranged: N/A DME Agency: NA       HH Arranged: NA [place] Agency: NA        Prior Living Arrangements/Services Living arrangements for the past 2 months: [place] with:: Self Patient language and need for interpreter reviewed:: Yes        Need for Family Participation in Patient Care: Yes (Comment) Care giver support system in place?: Yes (comment)   Criminal Activity/Legal Involvement Pertinent to Current Situation/Hospitalization: No - Comment as needed  Activities of Daily Living      Permission Sought/Granted Permission sought to share information with : Family Supports Permission granted to share information with : Yes, Verbal Permission Granted  Share Information with NAME: [name]     Permission granted to share info w Relationship: daughter     Emotional Assessment Appearance:: Appears stated age Attitude/Demeanor/Rapport: Engaged Affect (typically observed): Accepting Orientation: : Oriented to Self, Oriented to  Time, Oriented to Place, Oriented to Situation Alcohol / Substance Use: Not Applicable Psych Involvement: No (comment)  Admission diagnosis:  Hip pain [M25.559] CAP (community acquired pneumonia) [J18.9] Acute respiratory failure with hypoxia ([place]) [J96.01] Traumatic rhabdomyolysis, initial encounter ([place]) [T79.6XXA] Pneumonia of left upper lobe due to infectious organism [J18.9] Patient Active Problem List   Diagnosis Date Noted  . CAP (community acquired pneumonia) 02/25/2019  . Rhabdomyolysis  02/25/2019  . Chest pain, rule out acute myocardial infarction   . Chest pain 03/21/2015  . Dysphagia 03/21/2015  . CKD (chronic kidney disease), stage III   . CN (constipation) 11/13/2014  . Dysphagia, pharyngoesophageal phase 11/13/2014  . Coronary artery disease involving native coronary artery of native heart without angina pectoris  07/31/2014  . Chronic atrial fibrillation ([place]) 07/31/2014  . Adenomatous colon polyp 03/01/2014  . Swelling of limb- Right Leg > Left leg 09/13/2013  . Carotid stenosis 09/08/2012  . HLD (hyperlipidemia) 09/03/2008  . LEUKOCYTOSIS 09/03/2008  . Anxiety state 09/03/2008  . TOBACCO ABUSE 09/03/2008  . NEUROPATHY 09/03/2008  . Essential hypertension 09/03/2008  . Coronary atherosclerosis 09/03/2008  . BRADYCARDIA 09/03/2008  . CVA 09/03/2008  . GASTROESOPHAGEAL REFLUX DISEASE 09/03/2008  . BPH (benign prostatic hyperplasia) 09/03/2008   PCP:  Marton Redwood, MD Pharmacy:   [id number] [place] [place] [address] Phone: [phone] Fax: [phone]     Social Determinants of Health (SDOH) Interventions    Readmission Risk Interventions No flowsheet data found.

## 2019-02-28 NOTE — Care Management Important Message (Signed)
Important Message  Patient Details  Name: [name] MRN: [id number] Date of Birth: Dec 10, 1940   Medicare Important Message Given:  Yes     Orbie Pyo 02/28/2019, 3:24 PM

## 2019-02-28 NOTE — NC FL2 (Signed)
[place] LEVEL OF CARE SCREENING TOOL     IDENTIFICATION  Patient Name: [name] Birthdate: Mar 06, 1940 Sex: male Admission Date (Current Location): 02/25/2019  [facility] and [place] Number:  Herbalist and Address:  [facility] [place], [place] [address] [place], [place] [address]      Provider Number: [id number]  Attending Physician Name and Address:  Nita Sells, MD  Relative Name and Phone Number:  [name] (daughter) [phone]    Current Level of Care: Hospital Recommended Level of Care: [place] Prior Approval Number:    Date Approved/Denied: 04/06/18 PASRR Number: [id number]  Discharge Plan: SNF    Current Diagnoses: Patient Active Problem List   Diagnosis Date Noted  . CAP (community acquired pneumonia) 02/25/2019  . Rhabdomyolysis 02/25/2019  . Chest pain, rule out acute myocardial infarction   . Chest pain 03/21/2015  . Dysphagia 03/21/2015  . CKD (chronic kidney disease), stage III   . CN (constipation) 11/13/2014  . Dysphagia, pharyngoesophageal phase 11/13/2014  . Coronary artery disease involving native coronary artery of native heart without angina pectoris 07/31/2014  . Chronic atrial fibrillation ([place]) 07/31/2014  . Adenomatous colon polyp 03/01/2014  . Swelling of limb- Right Leg > Left leg 09/13/2013  . Carotid stenosis 09/08/2012  . HLD (hyperlipidemia) 09/03/2008  . LEUKOCYTOSIS 09/03/2008  . Anxiety state 09/03/2008  . TOBACCO ABUSE 09/03/2008  . NEUROPATHY 09/03/2008  . Essential hypertension 09/03/2008  . Coronary atherosclerosis 09/03/2008  . BRADYCARDIA 09/03/2008  . CVA 09/03/2008  . GASTROESOPHAGEAL REFLUX DISEASE 09/03/2008  . BPH (benign prostatic hyperplasia) 09/03/2008    Orientation RESPIRATION BLADDER Height & Weight     Self, Situation, Place  Normal Incontinent Weight: 122.5 kg(Bed may be incorrect, pt unable to stand, RN notified.) Height:      BEHAVIORAL SYMPTOMS/MOOD NEUROLOGICAL BOWEL NUTRITION STATUS      Incontinent Diet  AMBULATORY STATUS COMMUNICATION OF NEEDS Skin   Extensive Assist Verbally Bruising, Other (Comment)(bruising to arms; skin tears to elbows)                       Personal Care Assistance Level of Assistance  Bathing, Feeding, Dressing Bathing Assistance: Maximum assistance Feeding assistance: Limited assistance Dressing Assistance: Maximum assistance     Functional Limitations Info  Sight, Hearing, Speech Sight Info: Adequate Hearing Info: Adequate Speech Info: Adequate    SPECIAL CARE FACTORS FREQUENCY  PT (By licensed PT), OT (By licensed OT)     PT Frequency: PT at SNF to eval and treat 5 x week OT Frequency: OT at SNF to eval and treat 5 x week            Contractures Contractures Info: Not present    Additional Factors Info  Code Status, Allergies Code Status Info: DNR Allergies Info: amitryptilline; gabapentin; metoprolol; paroxetine; spiriva           Current Medications (02/28/2019):  This is the current hospital active medication list Current Facility-Administered Medications  Medication Dose Route Frequency Provider Last Rate Last Admin  . amLODipine (NORVASC) tablet 5 mg  5 mg Oral Daily Elwyn Reach, MD   5 mg at 02/28/19 0848  . apixaban (ELIQUIS) tablet 2.5 mg  2.5 mg Oral BID Gala Romney L, MD   2.5 mg at 02/28/19 0850  . aspirin EC tablet 81 mg  81 mg Oral Daily Elwyn Reach, MD   81 mg at 02/28/19 0850  .  atorvastatin (LIPITOR) tablet 40 mg  40 mg Oral QHS Gala Romney L, MD   40 mg at 02/27/19 2101  . azithromycin (ZITHROMAX) 500 mg in sodium chloride 0.9 % 250 mL IVPB  500 mg Intravenous Q24H Elwyn Reach, MD   Stopped at 02/27/19 1917  . cefTRIAXone (ROCEPHIN) 2 g in sodium chloride 0.9 % 100 mL IVPB  2 g Intravenous Q24H Elwyn Reach, MD   Stopped at 02/27/19 1918  . diazepam (VALIUM) tablet 2 mg  2 mg Oral QID PRN Elwyn Reach, MD   2 mg at 02/26/19 1157  . finasteride (PROSCAR) tablet 5 mg  5 mg Oral BID Elwyn Reach, MD   5 mg at 02/28/19 0849  . HYDROcodone-acetaminophen (NORCO/VICODIN) 5-325 MG per tablet 1 tablet  1 tablet Oral Q6H PRN Elwyn Reach, MD   1 tablet at 02/28/19 0407  . isosorbide mononitrate (IMDUR) 24 hr tablet 60 mg  60 mg Oral Daily Elwyn Reach, MD   60 mg at 02/28/19 0849  . lubiprostone (AMITIZA) capsule 8 mcg  8 mcg Oral BID WC Elwyn Reach, MD   8 mcg at 02/28/19 0849  . MEDLINE mouth rinse  15 mL Mouth Rinse BID Nita Sells, MD   15 mL at 02/28/19 0851  . Melatonin TABS 3 mg  3 mg Oral QHS Elwyn Reach, MD   3 mg at 02/27/19 2102  . metoprolol tartrate (LOPRESSOR) tablet 12.5 mg  12.5 mg Oral BID Nita Sells, MD   12.5 mg at 02/28/19 0849  . mirtazapine (REMERON) tablet 7.5 mg  7.5 mg Oral QHS Gala Romney L, MD   7.5 mg at 02/27/19 2103  . nitroGLYCERIN (NITROSTAT) SL tablet 0.4 mg  0.4 mg Sublingual Q5 min PRN Gala Romney L, MD      . pantoprazole (PROTONIX) EC tablet 40 mg  40 mg Oral Daily Elwyn Reach, MD   40 mg at 02/28/19 0850  . sertraline (ZOLOFT) tablet 100 mg  100 mg Oral BID Elwyn Reach, MD   100 mg at 02/28/19 1478     Discharge Medications: Please see discharge summary for a list of discharge medications.  Relevant Imaging Results:  Relevant Lab Results:   Additional Information SSN; [id number]  Bartholomew Crews, RN

## 2019-02-28 NOTE — Progress Notes (Signed)
Hospitalist progress note  [name] [id number] DOB: 11-May-1940 DOA: 02/25/2019  PCP: Marton Redwood, MD   Narrative:  79 year old male COPD reflux depression CAD status post BMS RCA 2010+ PVD and right iliac artery stenting 2011, prior stroke X2 with dysphagia, CKD 3 atrial fibrillation on Eliquis prior DVT Admit 02/25/2019 status post fall O2 sats 70s complained of right hip pain Found to have pneumonia and admitted treatment for the same  Data Reviewed:  BUN/creatinine down from 30/4.6-28/1.4 bicarb 19 WBC 12.4-->10.4 down from 13  hemoglobin 10.7--9.3 platelet 213 Abdominal x-ray 1/3 shows normal gas pattern cxr 1/4 shows bilateral infiltrates L>R--slight increase on the R  Assessment & Plan: Community-acquired pneumonia left upper lobe opacity continue IV antibiotics at this time Discontinue IV saline as is net +3.5 L--giving 1 dose IV Lasix 1/5 Desat screen as does not use oxygen at home Might require home o2? Stroke x2 in the past 4098 1191 complicated by dysphagia We will ask speech therapy to not emergently see given location of pneumonia Continue preventative Lipitor 40 daily apixaban 2.5 twice daily aspirin 81 mg Paroxysmal A. fib with prior DVT Episodic Vtach 1/3 or 1/4 Eliquis as above, Rpt mag in am CAD status post BMS RCA 2010 with right iliac artery stenting 2011 Continue Imdur 60 daily, metoprolol as above, amlodipine 5 CKD 2-3 Given fluids earlier in admission-we will hold for now as above Reflux Continue pantoprazole 40 daily BPH Continue Proscar 5 twice daily  On Eliquis, DNR confirmed discussed with daughter [phone] on 1/4 antcipate d/c in the next 2-3 days  Subjective: Well coherent in nad no new issues sitting up oxygen is off face no n/v eating Continues to c/o abd pains Consultants:   n Procedures:   n Antimicrobials:   Ceftriaxone and azithromycin    Objective: Vitals:   02/27/19 2104 02/28/19 0406 02/28/19 0537 02/28/19 0901  BP:  (!) 147/56 133/70 125/69 124/65  Pulse: (!) 104 97 82 89  Resp: 20 20  (!) 24  Temp: 98.4 F (36.9 C) 98.2 F (36.8 C)  97.6 F (36.4 C)  TempSrc: Oral Oral  Oral  SpO2: 93% 92%  93%  Weight:        Intake/Output Summary (Last 24 hours) at 02/28/2019 1053 Last data filed at 02/28/2019 0900 Gross per 24 hour  Intake 890 ml  Output 550 ml  Net 340 ml   Filed Weights   02/26/19 2105  Weight: 122.5 kg    Examination: Awake alert no distress Lungs rel clear today no new findingsd abd tender in LLQ Neuro intact in no focal deficit at this time  Scheduled Meds: . amLODipine  5 mg Oral Daily  . apixaban  2.5 mg Oral BID  . aspirin EC  81 mg Oral Daily  . atorvastatin  40 mg Oral QHS  . finasteride  5 mg Oral BID  . isosorbide mononitrate  60 mg Oral Daily  . lubiprostone  8 mcg Oral BID WC  . mouth rinse  15 mL Mouth Rinse BID  . Melatonin  3 mg Oral QHS  . metoprolol tartrate  12.5 mg Oral BID  . mirtazapine  7.5 mg Oral QHS  . pantoprazole  40 mg Oral Daily  . sertraline  100 mg Oral BID   Continuous Infusions: . azithromycin Stopped (02/27/19 1917)  . cefTRIAXone (ROCEPHIN)  IV Stopped (02/27/19 1918)     LOS: 3 days   Time spent: [place], MD [facility]  02/28/2019,  10:53 AM

## 2019-03-01 ENCOUNTER — Inpatient Hospital Stay (HOSPITAL_COMMUNITY): Payer: Medicare Other

## 2019-03-01 DIAGNOSIS — J9601 Acute respiratory failure with hypoxia: Secondary | ICD-10-CM

## 2019-03-01 LAB — CBC WITH DIFFERENTIAL/PLATELET
Abs Immature Granulocytes: 0.1 10*3/uL — ABNORMAL HIGH (ref 0.00–0.07)
Basophils Absolute: 0.1 10*3/uL (ref 0.0–0.1)
Basophils Relative: 1 %
Eosinophils Absolute: 0.2 10*3/uL (ref 0.0–0.5)
Eosinophils Relative: 2 %
HCT: 31.7 % — ABNORMAL LOW (ref 39.0–52.0)
Hemoglobin: 9.9 g/dL — ABNORMAL LOW (ref 13.0–17.0)
Immature Granulocytes: 1 %
Lymphocytes Relative: 9 %
Lymphs Abs: 1.1 10*3/uL (ref 0.7–4.0)
MCH: 26.6 pg (ref 26.0–34.0)
MCHC: 31.2 g/dL (ref 30.0–36.0)
MCV: 85.2 fL (ref 80.0–100.0)
Monocytes Absolute: 1.4 10*3/uL — ABNORMAL HIGH (ref 0.1–1.0)
Monocytes Relative: 12 %
Neutro Abs: 8.7 10*3/uL — ABNORMAL HIGH (ref 1.7–7.7)
Neutrophils Relative %: 75 %
Platelets: 236 10*3/uL (ref 150–400)
RBC: 3.72 MIL/uL — ABNORMAL LOW (ref 4.22–5.81)
RDW: 16.4 % — ABNORMAL HIGH (ref 11.5–15.5)
WBC: 11.5 10*3/uL — ABNORMAL HIGH (ref 4.0–10.5)
nRBC: 0 % (ref 0.0–0.2)

## 2019-03-01 LAB — COMPREHENSIVE METABOLIC PANEL
ALT: 51 U/L — ABNORMAL HIGH (ref 0–44)
AST: 77 U/L — ABNORMAL HIGH (ref 15–41)
Albumin: 2.6 g/dL — ABNORMAL LOW (ref 3.5–5.0)
Alkaline Phosphatase: 67 U/L (ref 38–126)
Anion gap: 12 (ref 5–15)
BUN: 31 mg/dL — ABNORMAL HIGH (ref 8–23)
CO2: 21 mmol/L — ABNORMAL LOW (ref 22–32)
Calcium: 8.5 mg/dL — ABNORMAL LOW (ref 8.9–10.3)
Chloride: 107 mmol/L (ref 98–111)
Creatinine, Ser: 1.63 mg/dL — ABNORMAL HIGH (ref 0.61–1.24)
GFR calc Af Amer: 45 mL/min — ABNORMAL LOW (ref >60)
GFR calc non Af Amer: 39 mL/min — ABNORMAL LOW (ref >60)
Glucose, Bld: 107 mg/dL — ABNORMAL HIGH (ref 70–99)
Potassium: 3.7 mmol/L (ref 3.5–5.1)
Sodium: 140 mmol/L (ref 135–145)
Total Bilirubin: 0.6 mg/dL (ref 0.3–1.2)
Total Protein: 6.5 g/dL (ref 6.5–8.1)

## 2019-03-01 MED ORDER — DIAZEPAM 2 MG PO TABS: 2.0000 mg | ORAL_TABLET | Freq: Three times a day (TID) | ORAL | Status: DC | PRN

## 2019-03-01 NOTE — Plan of Care (Signed)
  Problem: Pain Managment: Goal: General experience of comfort will improve Outcome: Progressing   

## 2019-03-01 NOTE — TOC Progression Note (Signed)
Transition of Care [facility]) - Progression Note    Patient Details  Name: [name] MRN: [id number] Date of Birth: Feb 11, 1941  Transition of Care [facility]) CM/SW Contact  Bartholomew Crews, RN Phone Number: [phone] 03/01/2019, 4:56 PM  Clinical Narrative:    Spoke with daughter, [name], by phone to discuss bed offers. Advised that [facility] in [place] is not accepting new patients at this time d/t covid outbreak. Spoke to [facility] who is only accepting covid + patients at this time. Discussed other bed offers and will f/u with [name] in the morning. TOC team following for transition needs.    Expected Discharge Plan: [place] Barriers to Discharge: Continued Medical Work up  Expected Discharge Plan and Services Expected Discharge Plan: [place] In-house Referral: Clinical Social Work Discharge Planning Services: CM Consult Post Acute Care Choice: [place] arrangements for the past 2 months: Single Family Home                 DME Arranged: N/A DME Agency: NA       HH Arranged: NA HH Agency: NA         Social Determinants of Health (SDOH) Interventions    Readmission Risk Interventions No flowsheet data found.

## 2019-03-01 NOTE — Progress Notes (Signed)
Patient has been having a productive cough this morning and recently coughed up some thick bloody sputum. Patient stated that this has been happening the past couple of days and that the doctor was aware. James John, MD paged and she stated that if it worsens to let her know. Will continue to monitor.

## 2019-03-01 NOTE — Progress Notes (Addendum)
PROGRESS NOTE    [name]  FUX:[id number] DOB: 29-Jan-1941 DOA: 02/25/2019 PCP: Marton Redwood, MD  Brief Narrative: [name] is a chronically ill 79 year old male with history of paroxysmal atrial fibrillation on apixaban, history of CAD, COPD, CVA x2, dysphagia, anxiety disorder, carotid artery disease, morbid obesity, GERD, hypertension, was brought in by family on 1/2 after a fall, he was unable to get off the floor and was found by family the following day, he complained of pain in his right hip, he was found to be in A. fib RVR and noted to have leukocytosis and hypoxia, CT head hip and C-spine were unrevealing, chest x-ray was concerning for possible left upper lobe pneumonia and also had evidence of rhabdomyolysis  Assessment & Plan:   Acute hypoxic respiratory failure/pneumonia -aspiration versus community-acquired pneumonia -Appears to be clinically improving on IV ceftriaxone and azithromycin -Blood cultures are negative, COVID-19 PCR and influenza panel are also negative -Complete 7 days of antibiotics  History of CVA -With residual dysphagia -SLP consulted, continue apixaban and Lipitor  Hip pain/fall -initially complained of R hip pain, Xray and CT without acute findings -now complains of L hip pain, will get CT L hip  Paroxysmal atrial fibrillation -Continue apixaban  CAD/PCI and stenting -Continue metoprolol, amlodipine and Imdur  CKD 2-3 -Stable  BPH -Continue Proscar  COPD -stable  Morbid obesity  DVT prophylaxis: Eliquis Code Status: DNR Family Communication: No family at bedside Disposition Plan: SNF for rehab when stable    Procedures:   Antimicrobials:    Subjective: -Complains of left-sided abdominal wall pain  Objective: Vitals:   02/28/19 1655 02/28/19 2140 03/01/19 0519 03/01/19 0922  BP: 135/75 131/66 (!) 145/74 (!) 149/76  Pulse: 99 97 94 89  Resp: 20 18 16 18   Temp: 98.6 F (37 C) 97.9 F (36.6 C) 98.5 F (36.9 C)  98.6 F (37 C)  TempSrc: Oral Oral Oral Oral  SpO2: 95% 94% 92% 94%  Weight:  122.5 kg      Intake/Output Summary (Last 24 hours) at 03/01/2019 1429 Last data filed at 03/01/2019 0830 Gross per 24 hour  Intake 890 ml  Output 1450 ml  Net -560 ml   Filed Weights   02/26/19 2105 02/28/19 2140  Weight: 122.5 kg 122.5 kg    Examination:  General exam: Elderly, chronically ill male, sitting up in bed, AAOx2 Respiratory system: Basilar rhonchi Cardiovascular system: S1 & S2 heard, RRR  Gastrointestinal system: Abdomen is nondistended, soft and nontender.Normal bowel sounds heard. Central nervous system: Awake alert, oriented x2 Extremities: No edema Skin: No rashes Psychiatry: Flat affect    Data Reviewed:   CBC: Recent Labs  Lab 02/25/19 1615 02/26/19 1245 02/27/19 0827 02/28/19 0440 03/01/19 0314  WBC 17.3* 13.4* 12.4* 10.4 11.5*  NEUTROABS 15.0*  --   --  7.9* 8.7*  HGB 12.2* 10.8* 10.7* 9.3* 9.9*  HCT 37.7* 34.5* 34.4* 30.8* 31.7*  MCV 84.9 84.6 85.8 87.5 85.2  PLT 246 234 231 213 025   Basic Metabolic Panel: Recent Labs  Lab 02/25/19 1615 02/26/19 1245 02/26/19 1531 02/27/19 0827 02/28/19 0440 03/01/19 0314  NA 141  --  142 143 142 140  K 5.2*  --  4.3 4.2 4.1 3.7  CL 104  --  113* 114* 113* 107  CO2 24  --  22 19* 20* 21*  GLUCOSE 122*  --  118* 100* 100* 107*  BUN 29*  --  30* 28* 29* 31*  CREATININE 1.72* 1.75*  1.61* 1.49* 1.57* 1.63*  CALCIUM 9.3  --  8.4* 8.7* 8.7* 8.5*  MG  --   --   --  2.1  --   --   PHOS  --   --   --  2.0*  --   --    GFR: Estimated Creatinine Clearance: 48.9 mL/min (A) (by C-G formula based on SCr of 1.63 mg/dL (H)). Liver Function Tests: Recent Labs  Lab 02/27/19 0827 02/28/19 0440 03/01/19 0314  AST  --  74* 77*  ALT  --  42 51*  ALKPHOS  --  63 67  BILITOT  --  0.9 0.6  PROT  --  6.4* 6.5  ALBUMIN 2.8* 2.6* 2.6*   No results for input(s): LIPASE, AMYLASE in the last 168 hours. No results for input(s):  AMMONIA in the last 168 hours. Coagulation Profile: Recent Labs  Lab 02/25/19 1615  INR 1.2   Cardiac Enzymes: Recent Labs  Lab 02/25/19 1615 02/27/19 1622  CKTOTAL 6,094* 1,786*   BNP (last 3 results) No results for input(s): PROBNP in the last 8760 hours. HbA1C: No results for input(s): HGBA1C in the last 72 hours. CBG: No results for input(s): GLUCAP in the last 168 hours. Lipid Profile: No results for input(s): CHOL, HDL, LDLCALC, TRIG, CHOLHDL, LDLDIRECT in the last 72 hours. Thyroid Function Tests: No results for input(s): TSH, T4TOTAL, FREET4, T3FREE, THYROIDAB in the last 72 hours. Anemia Panel: No results for input(s): VITAMINB12, FOLATE, FERRITIN, TIBC, IRON, RETICCTPCT in the last 72 hours. Urine analysis:    Component Value Date/Time   COLORURINE AMBER (A) 01/18/2018 0002   APPEARANCEUR TURBID (A) 01/18/2018 0002   LABSPEC 1.028 01/18/2018 0002   PHURINE 5.0 01/18/2018 0002   GLUCOSEU NEGATIVE 01/18/2018 0002   HGBUR MODERATE (A) 01/18/2018 0002   BILIRUBINUR NEGATIVE 01/18/2018 0002   KETONESUR NEGATIVE 01/18/2018 0002   PROTEINUR 100 (A) 01/18/2018 0002   NITRITE NEGATIVE 01/18/2018 0002   LEUKOCYTESUR LARGE (A) 01/18/2018 0002   Sepsis Labs: @LABRCNTIP (procalcitonin:4,lacticidven:4)  ) Recent Results (from the past 240 hour(s))  Respiratory Panel by RT PCR (Flu A&B, Covid) - Nasopharyngeal Swab     Status: None   Collection Time: 02/25/19  2:24 PM   Specimen: Nasopharyngeal Swab  Result Value Ref Range Status   SARS Coronavirus 2 by RT PCR NEGATIVE NEGATIVE Final    Comment: (NOTE) SARS-CoV-2 target nucleic acids are NOT DETECTED. The SARS-CoV-2 RNA is generally detectable in upper respiratoy specimens during the acute phase of infection. The lowest concentration of SARS-CoV-2 viral copies this assay can detect is 131 copies/mL. A negative result does not preclude SARS-Cov-2 infection and should not be used as the sole basis for treatment  or other patient management decisions. A negative result may occur with  improper specimen collection/handling, submission of specimen other than nasopharyngeal swab, presence of viral mutation(s) within the areas targeted by this assay, and inadequate number of viral copies (<131 copies/mL). A negative result must be combined with clinical observations, patient history, and epidemiological information. The expected result is Negative. Fact Sheet for Patients:  PinkCheek.be Fact Sheet for Healthcare Providers:  GravelBags.it This test is not yet ap proved or cleared by the [place] FDA and  has been authorized for detection and/or diagnosis of SARS-CoV-2 by FDA under an Emergency Use Authorization (EUA). This EUA will remain  in effect (meaning this test can be used) for the duration of the COVID-19 declaration under Section 564(b)(1) of the Act, 21 U.S.C. section  360bbb-3(b)(1), unless the authorization is terminated or revoked sooner.    Influenza A by PCR NEGATIVE NEGATIVE Final   Influenza B by PCR NEGATIVE NEGATIVE Final    Comment: (NOTE) The Xpert Xpress SARS-CoV-2/FLU/RSV assay is intended as an aid in  the diagnosis of influenza from Nasopharyngeal swab specimens and  should not be used as a sole basis for treatment. Nasal washings and  aspirates are unacceptable for Xpert Xpress SARS-CoV-2/FLU/RSV  testing. Fact Sheet for Patients: PinkCheek.be Fact Sheet for Healthcare Providers: GravelBags.it This test is not yet approved or cleared by the [place] FDA and  has been authorized for detection and/or diagnosis of SARS-CoV-2 by  FDA under an Emergency Use Authorization (EUA). This EUA will remain  in effect (meaning this test can be used) for the duration of the  Covid-19 declaration under Section 564(b)(1) of the Act, 21  U.S.C. section  360bbb-3(b)(1), unless the authorization is  terminated or revoked. Performed at [facility], [place] [address], [place], [place] [address]          Radiology Studies: DG Chest 2 View  Result Date: 02/27/2019 CLINICAL DATA:  Chest heaviness with productive cough EXAM: CHEST - 2 VIEW COMPARISON:  02/25/2019 FINDINGS: Cardiac shadow is stable. Aortic calcifications are again seen. Stable left upper lobe opacity is noted. Mild right upper lobe opacity is noted slightly increased from the prior exam. No sizable effusion is seen. No bony abnormality is noted. IMPRESSION: Bilateral upper lobe infiltrates left greater than right. These are slightly increased on the right when compared with prior exam. Electronically Signed   By: Inez Catalina M.D.   On: 02/27/2019 15:54        Scheduled Meds: . amLODipine  5 mg Oral Daily  . apixaban  2.5 mg Oral BID  . aspirin EC  81 mg Oral Daily  . atorvastatin  40 mg Oral QHS  . finasteride  5 mg Oral BID  . isosorbide mononitrate  60 mg Oral Daily  . lubiprostone  8 mcg Oral BID WC  . mouth rinse  15 mL Mouth Rinse BID  . Melatonin  3 mg Oral QHS  . metoprolol tartrate  12.5 mg Oral BID  . mirtazapine  7.5 mg Oral QHS  . pantoprazole  40 mg Oral Daily  . sertraline  100 mg Oral BID   Continuous Infusions: . azithromycin Stopped (02/27/19 1917)  . cefTRIAXone (ROCEPHIN)  IV Stopped (02/28/19 2204)     LOS: 4 days    Time spent:47min  Domenic Polite, MD [facility]   03/01/2019, 2:29 PM

## 2019-03-01 NOTE — Evaluation (Signed)
Occupational Therapy Evaluation Patient Details Name: [name] MRN: [id number] DOB: 1940-04-08 Today's Date: 03/01/2019    History of Present Illness [name] is a 79 y.o. male that presents after a fall in the bathroom. Pt was hypoxic to the 70s on arrival. Noted to have PNA. PMH includes atrial fibrillation, bradycardia, anxiety disorder, carotid artery occlusion, COPD, hypertension, GERD, morbid obesity.   Clinical Impression   Pt presents with above diagnoses, resulting in cardiopulmonary involvement, generalized weakness, and decreased activity tolerance limiting ability to engage in BADL at desired level of independence. He is on 2L [place] during session, reports he does not wear at baseline. Pt is not able to give clear picture of PLOF given current cognitive status. He states he lives alone, but unsure of this accuracy given current functional level. Pt required total A +2 for bed mobility and max A to maintain sitting balance EOB. Pt initiated sit <> stand with total A +2 with use of stedy to recliner chair. DOE 3/4 noted with transfer with recovery time and cues for pursed lip breathing. Given current status, recommend SNF at d/c. Will continue to follow per POC listed below.     Follow Up Recommendations  SNF;Supervision/Assistance - 24 hour    Equipment Recommendations  None recommended by OT    Recommendations for Other Services       Precautions / Restrictions Precautions Precautions: Fall Restrictions Weight Bearing Restrictions: No      Mobility Bed Mobility Overal bed mobility: Needs Assistance Bed Mobility: Supine to Sit     Supine to sit: +2 for physical assistance;+2 for safety/equipment;Total assist     General bed mobility comments: heavy total A +2 to get EOB. Assist to walk legs to EOB and trunk to pull up to sitting  Transfers Overall transfer level: Needs assistance Equipment used: Ambulation equipment used Transfers: Stand Pivot  Transfers   Stand pivot transfers: Total assist;+2 physical assistance;+2 safety/equipment       General transfer comment: heavy total A +2 to come up to standing and place stedy paddles under bottom    Balance Overall balance assessment: Needs assistance Sitting-balance support: Feet supported;Bilateral upper extremity supported Sitting balance-Leahy Scale: Zero Sitting balance - Comments: max A to maintain sitting EOB     Standing balance-Leahy Scale: Zero Standing balance comment: use of stedy and total A +2 to initiate stand                           ADL either performed or assessed with clinical judgement   ADL Overall ADL's : Needs assistance/impaired Eating/Feeding: Set up;Sitting   Grooming: Set up;Sitting   Upper Body Bathing: Maximal assistance;Sitting   Lower Body Bathing: Total assistance;Sitting/lateral leans;Sit to/from stand   Upper Body Dressing : Maximal assistance;Sitting   Lower Body Dressing: Total assistance;Sitting/lateral leans;Sit to/from stand Lower Body Dressing Details (indicate cue type and reason): states he is not able to don socks at baseline, not able to cross legs Toilet Transfer: Total assistance;+2 for physical assistance;Stand-pivot Toilet Transfer Details (indicate cue type and reason): simulated to recliner with stedy, heavy total A +2 to rise to place stedy pads under bottom Toileting- Clothing Manipulation and Hygiene: Total assistance       Functional mobility during ADLs: Total assistance;+2 for physical assistance;+2 for safety/equipment(with stedy to chair only) General ADL Comments: pt very deconditioned, needing assist for all transfers and BADL at this time     Vision Patient  Visual Report: No change from baseline Vision Assessment?: No apparent visual deficits     Perception     Praxis      Pertinent Vitals/Pain Pain Assessment: Faces Faces Pain Scale: Hurts little more Pain Location: L ribcage Pain  Descriptors / Indicators: Aching;Sore Pain Intervention(s): Limited activity within patient's tolerance;Monitored during session;Repositioned     Hand Dominance Right   Extremity/Trunk Assessment Upper Extremity Assessment Upper Extremity Assessment: Generalized weakness   Lower Extremity Assessment Lower Extremity Assessment: Generalized weakness       Communication Communication Communication: No difficulties   Cognition Arousal/Alertness: Awake/alert Behavior During Therapy: Flat affect Overall Cognitive Status: No family/caregiver present to determine baseline cognitive functioning Area of Impairment: Orientation;Memory;Safety/judgement;Problem solving                 Orientation Level: Time(able to recall that he fell but unable to recall details)   Memory: Decreased short-term memory   Safety/Judgement: Decreased awareness of deficits   Problem Solving: Slow processing;Decreased initiation;Difficulty sequencing;Requires verbal cues;Requires tactile cues General Comments: pt having difficulty giving full report of history, needing multimodal cues to complete tasks   General Comments       Exercises     Shoulder Instructions      Home Living Family/patient expects to be discharged to:: Unsure                                 Additional Comments: pt not able to give full report. He states he lives alone and manages BADL, but unsure of this accuracy given current condition      Prior Functioning/Environment          Comments: unsure of level of function, does state daughter "sometimes" helps wit groceries- again not sure of accuracy of this        OT Problem List: Decreased strength;Decreased knowledge of use of DME or AE;Decreased knowledge of precautions;Obesity;Decreased activity tolerance;Decreased cognition;Cardiopulmonary status limiting activity;Impaired balance (sitting and/or standing);Decreased safety awareness;Pain      OT  Treatment/Interventions: Self-care/ADL training;Therapeutic exercise;Patient/family education;Balance training;Energy conservation;Therapeutic activities;DME and/or AE instruction;Cognitive remediation/compensation    OT Goals(Current goals can be found in the care plan section) Acute Rehab OT Goals Patient Stated Goal: get better OT Goal Formulation: With patient Time For Goal Achievement: 03/15/19 Potential to Achieve Goals: Good  OT Frequency: Min 2X/week   Barriers to D/C:            Co-evaluation              AM-PAC OT "6 Clicks" Daily Activity     Outcome Measure Help from another person eating meals?: A Little Help from another person taking care of personal grooming?: A Little Help from another person toileting, which includes using toliet, bedpan, or urinal?: Total Help from another person bathing (including washing, rinsing, drying)?: Total Help from another person to put on and taking off regular upper body clothing?: A Lot Help from another person to put on and taking off regular lower body clothing?: Total 6 Click Score: 11   End of Session Equipment Utilized During Treatment: Other (comment);Oxygen(stedy) Nurse Communication: Mobility status;Need for lift equipment  Activity Tolerance: Patient tolerated treatment well Patient left: in chair;with call bell/phone within reach;with chair alarm set  OT Visit Diagnosis: Unsteadiness on feet (R26.81);Other abnormalities of gait and mobility (R26.89);Muscle weakness (generalized) (M62.81);Other symptoms and signs involving cognitive function;Pain;History of falling (Z91.81) Pain - part of body: (ribs)  Time: 9012-2241 OT Time Calculation (min): 26 min Charges:  OT General Charges $OT Visit: 1 Visit OT Evaluation $OT Eval Moderate Complexity: 1 Mod OT Treatments $Self Care/Home Management : 8-22 mins  Zenovia Jarred, MSOT, OTR/L Acute Rehabilitation Services [facility] Office Number:  [phone]  Zenovia Jarred 03/01/2019, 1:11 PM

## 2019-03-02 LAB — COMPREHENSIVE METABOLIC PANEL
ALT: 70 U/L — ABNORMAL HIGH (ref 0–44)
AST: 97 U/L — ABNORMAL HIGH (ref 15–41)
Albumin: 2.5 g/dL — ABNORMAL LOW (ref 3.5–5.0)
Alkaline Phosphatase: 71 U/L (ref 38–126)
Anion gap: 11 (ref 5–15)
BUN: 29 mg/dL — ABNORMAL HIGH (ref 8–23)
CO2: 20 mmol/L — ABNORMAL LOW (ref 22–32)
Calcium: 8.8 mg/dL — ABNORMAL LOW (ref 8.9–10.3)
Chloride: 111 mmol/L (ref 98–111)
Creatinine, Ser: 1.57 mg/dL — ABNORMAL HIGH (ref 0.61–1.24)
GFR calc Af Amer: 48 mL/min — ABNORMAL LOW (ref >60)
GFR calc non Af Amer: 41 mL/min — ABNORMAL LOW (ref >60)
Glucose, Bld: 107 mg/dL — ABNORMAL HIGH (ref 70–99)
Potassium: 4.1 mmol/L (ref 3.5–5.1)
Sodium: 142 mmol/L (ref 135–145)
Total Bilirubin: 0.7 mg/dL (ref 0.3–1.2)
Total Protein: 6.8 g/dL (ref 6.5–8.1)

## 2019-03-02 LAB — CBC WITH DIFFERENTIAL/PLATELET
Abs Immature Granulocytes: 0.08 10*3/uL — ABNORMAL HIGH (ref 0.00–0.07)
Basophils Absolute: 0.1 10*3/uL (ref 0.0–0.1)
Basophils Relative: 1 %
Eosinophils Absolute: 0.3 10*3/uL (ref 0.0–0.5)
Eosinophils Relative: 3 %
HCT: 30.4 % — ABNORMAL LOW (ref 39.0–52.0)
Hemoglobin: 9.6 g/dL — ABNORMAL LOW (ref 13.0–17.0)
Immature Granulocytes: 1 %
Lymphocytes Relative: 8 %
Lymphs Abs: 0.8 10*3/uL (ref 0.7–4.0)
MCH: 27 pg (ref 26.0–34.0)
MCHC: 31.6 g/dL (ref 30.0–36.0)
MCV: 85.6 fL (ref 80.0–100.0)
Monocytes Absolute: 1.1 10*3/uL — ABNORMAL HIGH (ref 0.1–1.0)
Monocytes Relative: 11 %
Neutro Abs: 7.7 10*3/uL (ref 1.7–7.7)
Neutrophils Relative %: 76 %
Platelets: 260 10*3/uL (ref 150–400)
RBC: 3.55 MIL/uL — ABNORMAL LOW (ref 4.22–5.81)
RDW: 16.5 % — ABNORMAL HIGH (ref 11.5–15.5)
WBC: 10 10*3/uL (ref 4.0–10.5)
nRBC: 0 % (ref 0.0–0.2)

## 2019-03-02 LAB — SARS CORONAVIRUS 2 (TAT 6-24 HRS): SARS Coronavirus 2: NEGATIVE

## 2019-03-02 NOTE — Progress Notes (Signed)
PROGRESS NOTE    [name]  CXK:[id number] DOB: 1940/08/22 DOA: 02/25/2019 PCP: Marton Redwood, MD  Brief Narrative: Mr. [name] is a chronically ill 79 year old male with history of paroxysmal atrial fibrillation on apixaban, history of CAD, COPD, CVA x2, dysphagia, anxiety disorder, carotid artery disease, morbid obesity, GERD, hypertension, was brought in by family on 1/2 after a fall, he was unable to get off the floor and was found by family the following day, he complained of pain in his right hip, he was found to be in A. fib RVR and noted to have leukocytosis and hypoxia, CT head hip and C-spine were unrevealing, chest x-ray was concerning for possible left upper lobe pneumonia and also had evidence of rhabdomyolysis  Assessment & Plan:   Acute hypoxic respiratory failure/pneumonia -aspiration versus community-acquired pneumonia -Appears to be clinically improving on IV ceftriaxone and azithromycin -Blood cultures are negative, COVID-19 PCR and influenza panel are also negative -Complete 7 days of antibiotics  Left rectus sheath hematoma -Status post mechanical fall, anticoagulation -Hemoglobin is stable, given extremely high CHA2DS2-VASc score with multiple strokes, elected to continue anticoagulation with apixaban, risk-benefit discussed with daughter  History of CVA -With residual dysphagia, continue dysphagia 2 diet -SLP consulted, continue apixaban and Lipitor  Paroxysmal atrial fibrillation -Continue apixaban  CAD/PCI and stenting -Continue metoprolol, amlodipine and Imdur  CKD 2-3 -Stable  BPH -Continue Proscar  COPD -stable  Morbid obesity  DVT prophylaxis: Eliquis Code Status: DNR Family Communication: No family at bedside, called and and d/w daughter [name] Disposition Plan: SNF tomorrow if stable    Procedures:   Antimicrobials:   Subjective: -No events overnight, left abdominal wall is less painful today, complains about hospital  food  Objective: Vitals:   03/01/19 0922 03/01/19 2050 03/02/19 0440 03/02/19 1016  BP: (!) 149/76 119/62 138/73 (!) 152/75  Pulse: 89 89 98 87  Resp: 18 16 20  (!) 22  Temp: 98.6 F (37 C) 97.9 F (36.6 C) 98.3 F (36.8 C) 98 F (36.7 C)  TempSrc: Oral   Oral  SpO2: 94% 91% 93% 96%  Weight:        Intake/Output Summary (Last 24 hours) at 03/02/2019 1525 Last data filed at 03/02/2019 0900 Gross per 24 hour  Intake 1098.94 ml  Output 725 ml  Net 373.94 ml   Filed Weights   02/26/19 2105 02/28/19 2140  Weight: 122.5 kg 122.5 kg    Examination:  Gen: Early, chronically ill male sitting up in bed, awake alert oriented x2 HEENT: PERRLA, Neck supple, no JVD Lungs: Few basilar rhonchi CVS: RRR,No Gallops,Rubs or new Murmurs Abd: soft, Non tender, non distended, BS present Extremities: Chronic venous stasis changes Skin: no new rashes Psychiatry: Flat affect    Data Reviewed:   CBC: Recent Labs  Lab 02/25/19 1615 02/26/19 1245 02/27/19 0827 02/28/19 0440 03/01/19 0314 03/02/19 0417  WBC 17.3* 13.4* 12.4* 10.4 11.5* 10.0  NEUTROABS 15.0*  --   --  7.9* 8.7* 7.7  HGB 12.2* 10.8* 10.7* 9.3* 9.9* 9.6*  HCT 37.7* 34.5* 34.4* 30.8* 31.7* 30.4*  MCV 84.9 84.6 85.8 87.5 85.2 85.6  PLT 246 234 231 213 236 970   Basic Metabolic Panel: Recent Labs  Lab 02/26/19 1531 02/27/19 0827 02/28/19 0440 03/01/19 0314 03/02/19 0417  NA 142 143 142 140 142  K 4.3 4.2 4.1 3.7 4.1  CL 113* 114* 113* 107 111  CO2 22 19* 20* 21* 20*  GLUCOSE 118* 100* 100* 107* 107*  BUN 30*  28* 29* 31* 29*  CREATININE 1.61* 1.49* 1.57* 1.63* 1.57*  CALCIUM 8.4* 8.7* 8.7* 8.5* 8.8*  MG  --  2.1  --   --   --   PHOS  --  2.0*  --   --   --    GFR: Estimated Creatinine Clearance: 50.7 mL/min (A) (by C-G formula based on SCr of 1.57 mg/dL (H)). Liver Function Tests: Recent Labs  Lab 02/27/19 0827 02/28/19 0440 03/01/19 0314 03/02/19 0417  AST  --  74* 77* 97*  ALT  --  42 51* 70*   ALKPHOS  --  63 67 71  BILITOT  --  0.9 0.6 0.7  PROT  --  6.4* 6.5 6.8  ALBUMIN 2.8* 2.6* 2.6* 2.5*   No results for input(s): LIPASE, AMYLASE in the last 168 hours. No results for input(s): AMMONIA in the last 168 hours. Coagulation Profile: Recent Labs  Lab 02/25/19 1615  INR 1.2   Cardiac Enzymes: Recent Labs  Lab 02/25/19 1615 02/27/19 1622  CKTOTAL 6,094* 1,786*   BNP (last 3 results) No results for input(s): PROBNP in the last 8760 hours. HbA1C: No results for input(s): HGBA1C in the last 72 hours. CBG: No results for input(s): GLUCAP in the last 168 hours. Lipid Profile: No results for input(s): CHOL, HDL, LDLCALC, TRIG, CHOLHDL, LDLDIRECT in the last 72 hours. Thyroid Function Tests: No results for input(s): TSH, T4TOTAL, FREET4, T3FREE, THYROIDAB in the last 72 hours. Anemia Panel: No results for input(s): VITAMINB12, FOLATE, FERRITIN, TIBC, IRON, RETICCTPCT in the last 72 hours. Urine analysis:    Component Value Date/Time   COLORURINE AMBER (A) 01/18/2018 0002   APPEARANCEUR TURBID (A) 01/18/2018 0002   LABSPEC 1.028 01/18/2018 0002   PHURINE 5.0 01/18/2018 0002   GLUCOSEU NEGATIVE 01/18/2018 0002   HGBUR MODERATE (A) 01/18/2018 0002   BILIRUBINUR NEGATIVE 01/18/2018 0002   KETONESUR NEGATIVE 01/18/2018 0002   PROTEINUR 100 (A) 01/18/2018 0002   NITRITE NEGATIVE 01/18/2018 0002   LEUKOCYTESUR LARGE (A) 01/18/2018 0002   Sepsis Labs: @LABRCNTIP (procalcitonin:4,lacticidven:4)  ) Recent Results (from the past 240 hour(s))  Respiratory Panel by RT PCR (Flu A&B, Covid) - Nasopharyngeal Swab     Status: None   Collection Time: 02/25/19  2:24 PM   Specimen: Nasopharyngeal Swab  Result Value Ref Range Status   SARS Coronavirus 2 by RT PCR NEGATIVE NEGATIVE Final    Comment: (NOTE) SARS-CoV-2 target nucleic acids are NOT DETECTED. The SARS-CoV-2 RNA is generally detectable in upper respiratoy specimens during the acute phase of infection. The  lowest concentration of SARS-CoV-2 viral copies this assay can detect is 131 copies/mL. A negative result does not preclude SARS-Cov-2 infection and should not be used as the sole basis for treatment or other patient management decisions. A negative result may occur with  improper specimen collection/handling, submission of specimen other than nasopharyngeal swab, presence of viral mutation(s) within the areas targeted by this assay, and inadequate number of viral copies (<131 copies/mL). A negative result must be combined with clinical observations, patient history, and epidemiological information. The expected result is Negative. Fact Sheet for Patients:  PinkCheek.be Fact Sheet for Healthcare Providers:  GravelBags.it This test is not yet ap proved or cleared by the [place] FDA and  has been authorized for detection and/or diagnosis of SARS-CoV-2 by FDA under an Emergency Use Authorization (EUA). This EUA will remain  in effect (meaning this test can be used) for the duration of the COVID-19 declaration under Section 564(b)(1)  of the Act, 21 U.S.C. section 360bbb-3(b)(1), unless the authorization is terminated or revoked sooner.    Influenza A by PCR NEGATIVE NEGATIVE Final   Influenza B by PCR NEGATIVE NEGATIVE Final    Comment: (NOTE) The Xpert Xpress SARS-CoV-2/FLU/RSV assay is intended as an aid in  the diagnosis of influenza from Nasopharyngeal swab specimens and  should not be used as a sole basis for treatment. Nasal washings and  aspirates are unacceptable for Xpert Xpress SARS-CoV-2/FLU/RSV  testing. Fact Sheet for Patients: PinkCheek.be Fact Sheet for Healthcare Providers: GravelBags.it This test is not yet approved or cleared by the [place] FDA and  has been authorized for detection and/or diagnosis of SARS-CoV-2 by  FDA under an Emergency  Use Authorization (EUA). This EUA will remain  in effect (meaning this test can be used) for the duration of the  Covid-19 declaration under Section 564(b)(1) of the Act, 21  U.S.C. section 360bbb-3(b)(1), unless the authorization is  terminated or revoked. Performed at [facility], [place] [address], [place], [place] [address]          Radiology Studies: CT HIP LEFT WO CONTRAST  Result Date: 03/02/2019 CLINICAL DATA:  The patient suffered a fall 02/24/2019. Left hip pain. Subsequent encounter. EXAM: CT OF THE LEFT HIP WITHOUT CONTRAST TECHNIQUE: Multidetector CT imaging of the left hip was performed according to the standard protocol. Multiplanar CT image reconstructions were also generated. COMPARISON:  CT chest, abdomen and pelvis 04/23/2006. CT right hip 02/25/2019. FINDINGS: Bones/Joint/Cartilage The left hip is located. No fracture is identified. No avascular necrosis of the femoral head. There is left hip joint space narrowing and a small subchondral cyst in the periphery of the left acetabulum. Degenerative endplate sclerosis is seen at L4-5 and L5-S1. There is a Schmorl's node in the inferior endplate of L4. No hip joint effusion is seen. Ligaments Suboptimally assessed by CT. Muscles and Tendons There is a hematoma in the left rectus sheath measuring approximately 3.8 cm AP x 7.8 cm transverse x 9.5 cm craniocaudal. Soft tissues Imaged intrapelvic contents demonstrate no acute abnormality. Atherosclerosis is noted. IMPRESSION: Negative for hip fracture. No acute bony abnormality is identified. If concern for hip fracture persists, MRI without contrast is a more sensitive test for the detection of fracture. Left rectus sheath hematoma as described above. Atherosclerosis. These results were called by telephone at the time of interpretation on 03/02/2019 at 7:21 am to provider Aneta Mins, RN, who verbally acknowledged these results. Electronically Signed   By: Inge Rise M.D.    On: 03/02/2019 07:24        Scheduled Meds: . amLODipine  5 mg Oral Daily  . apixaban  2.5 mg Oral BID  . atorvastatin  40 mg Oral QHS  . finasteride  5 mg Oral BID  . isosorbide mononitrate  60 mg Oral Daily  . lubiprostone  8 mcg Oral BID WC  . mouth rinse  15 mL Mouth Rinse BID  . Melatonin  3 mg Oral QHS  . metoprolol tartrate  12.5 mg Oral BID  . mirtazapine  7.5 mg Oral QHS  . pantoprazole  40 mg Oral Daily  . sertraline  100 mg Oral BID   Continuous Infusions: . azithromycin Stopped (03/01/19 2131)  . cefTRIAXone (ROCEPHIN)  IV 2 g (03/01/19 1744)     LOS: 5 days    Time spent: 75min  Domenic Polite, MD [facility]   03/02/2019, 3:25 PM

## 2019-03-02 NOTE — Evaluation (Signed)
Clinical/Bedside Swallow Evaluation Patient Details  Name: [name] MRN: [id number] Date of Birth: 1940/11/19  Today's Date: 03/02/2019 Time: SLP Start Time (ACUTE ONLY): 1008 SLP Stop Time (ACUTE ONLY): 1030 SLP Time Calculation (min) (ACUTE ONLY): 22 min  Past Medical History:  Past Medical History:  Diagnosis Date  . Anxiety   . Atrial fibrillation (HCC)   . Bradycardia   . Carotid artery occlusion   . Cataract    bilataeral cateracts removed  . CKD (chronic kidney disease), stage III   . Colon polyps   . Common bile duct stone   . COPD (chronic obstructive pulmonary disease) (HCC)   . 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 CVA (cerebral vascular accident) (HCC)   . Depression   . DVT (deep venous thrombosis) (HCC)   . Dyslipidemia   . Essential hypertension   . Foot pain   . GERD (gastroesophageal reflux disease)   . History of benign prostatic hypertrophy   . History of leukocytosis   . Hypertension   . MI, old   . Neuromuscular disorder (HCC)    neuropathy  . Neuropathy   . Peripheral vascular disease (HCC)   . Renal failure, chronic    stage 111  . Tobacco abuse    Past Surgical History:  Past Surgical History:  Procedure Laterality Date  . ANKLE SURGERY    . BYPASS GRAFT  2009 or  2010   stent  . CHOLECYSTECTOMY     Gall Bladder  . COLONOSCOPY    . intervention of the RCA     using a single BMS   HPI:  [name] is a 79 y.o. male that presents after a fall in the bathroom. Pt was hypoxic to the 70s on arrival. Noted to have PNA. PMH includes atrial fibrillation, bradycardia, anxiety disorder, carotid artery occlusion, COPD, hypertension, GERD, morbid obesity.  Pt completed a MBS on 03/01/2014 and it was remarkable for laryngeal penetration of thin liquids that was cleared with a chin tuck.     Assessment / Plan / Recommendation Clinical Impression  Pt was seen for a bedside swallow evaluation  and he presents with oral dysphagia and suspected pharyngeal dysphagia.  Pt with known hx of mild pharyngeal dysphagia via MBS in 2016.  Oral mech exam revealed generalized oral weakness and edentulism.  RN reported that pt was tolerating liquids well, but that he was occassionally choking on solids.  Upon SLP arrival, pt was observed to have a baseline congested cough and he was expelling bloody sputum from his oral cavity.  RN and MD were aware and are monitoring.  Pt consumed trials of thin liquid (cup/straw), puree, and regular solids.  He exhibited a delayed throat clear and delayed cough with thin liquid via straw sip; however, no clinical s/sx of aspiration were observed with cup sips.  SLP who completed the MBS in 2016 recommended a chin tuck with thin liquid to eliminate laryngeal penetration, but suspect that pt will not be able to consistently use this strategy secondary to cognition.  Pt exhibited prolonged mastication of regular solids secondary to edentulism and he required a liquid wash to clear mild oral residue.  Pt was additionally seen consuming multiple small pills all at once with a thin liquid wash and he exhibited an immediate cough.  Recommend that medications be given a few at a time in puree and recommend a diet change to Dysphagia 2 (fine  chop) solids and continuation of thin liquids with the following compensatory strategies: 1) NO STRAWS 2) Small bites/sips 3) Slow rate of intake 4) Sit upright as possible 5) Remain upright for 30+ minutes after PO intake.  SLP will f/u for diagnostic treatment per POC.    SLP Visit Diagnosis: Dysphagia, oropharyngeal phase (R13.12)    Aspiration Risk  Mild aspiration risk    Diet Recommendation Dysphagia 2 (Fine chop);Thin liquid   Liquid Administration via: No straw;Cup Medication Administration: Whole meds with puree Supervision: Staff to assist with self feeding;Intermittent supervision to cue for compensatory strategies Compensations:  Minimize environmental distractions;Slow rate;Small sips/bites Postural Changes: Seated upright at 90 degrees;Remain upright for at least 30 minutes after po intake    Other  Recommendations Oral Care Recommendations: Oral care BID   Follow up Recommendations Skilled Nursing facility;24 hour supervision/assistance      Frequency and Duration min 2x/week  2 weeks       Prognosis Prognosis for Safe Diet Advancement: Fair      Swallow Study   General HPI: [name] is a 79 y.o. male that presents after a fall in the bathroom. Pt was hypoxic to the 70s on arrival. Noted to have PNA. PMH includes atrial fibrillation, bradycardia, anxiety disorder, carotid artery occlusion, COPD, hypertension, GERD, morbid obesity.  Pt completed a MBS on 03/01/2014 and it was remarkable for laryngeal penetration of thin liquids that was cleared with a chin tuck.   Type of Study: Bedside Swallow Evaluation Previous Swallow Assessment: BSE 03/22/15 Diet Prior to this Study: Regular;Thin liquids Temperature Spikes Noted: No Respiratory Status: Nasal cannula History of Recent Intubation: No Behavior/Cognition: Alert;Cooperative;Pleasant mood Oral Cavity Assessment: Within Functional Limits Oral Care Completed by SLP: No Oral Cavity - Dentition: Edentulous Vision: Functional for self-feeding Self-Feeding Abilities: Needs set up;Needs assist Patient Positioning: Upright in chair Baseline Vocal Quality: Normal Volitional Cough: Strong Volitional Swallow: Able to elicit    Oral/Motor/Sensory Function Overall Oral Motor/Sensory Function: Generalized oral weakness Facial ROM: Reduced right;Reduced left Facial Symmetry: Abnormal symmetry right Facial Sensation: Within Functional Limits Lingual ROM: Reduced right;Reduced left Lingual Symmetry: Within Functional Limits Lingual Strength: Reduced   Ice Chips Ice chips: Not tested   Thin Liquid Thin Liquid: Impaired Presentation: Straw;Cup Pharyngeal   Phase Impairments: Suspected delayed Swallow;Throat Clearing - Delayed;Cough - Delayed    Nectar Thick Nectar Thick Liquid: Not tested   Honey Thick Honey Thick Liquid: Not tested   Puree Puree: Within functional limits Presentation: Self Fed;Spoon   Solid     Solid: Impaired Presentation: Self Fed Oral Phase Impairments: Impaired mastication Oral Phase Functional Implications: Impaired mastication;Prolonged oral transit     Villa Herb M.S., CCC-SLP Acute Rehabilitation Services Office: [phone]  Shanon Rosser Trajon Rosete 03/02/2019,11:24 AM

## 2019-03-02 NOTE — Progress Notes (Signed)
Physical Therapy Treatment Patient Details Name: [name] MRN: [id number] DOB: [birth year] Today's Date: 03/02/2019    History of Present Illness [name] is a 79 y.o. male that presents after a fall in the bathroom. Pt was hypoxic to the 70s on arrival. Noted to have PNA. PMH includes atrial fibrillation, bradycardia, anxiety disorder, carotid artery occlusion, COPD, hypertension, GERD, morbid obesity.    PT Comments    Pt moving better today, requiring decreased assist. +1 max assist bed mobility and +2 mod/max assist sit to stand from elevated bed in stedy. Pt transferred bed to recliner with stedy. He performed LE exercises in recliner. Pt on 2L O2 throughout session. Current POC remains appropriate.   Follow Up Recommendations  SNF     Equipment Recommendations  None recommended by PT    Recommendations for Other Services       Precautions / Restrictions Precautions Precautions: Fall    Mobility  Bed Mobility Overal bed mobility: Needs Assistance Bed Mobility: Supine to Sit     Supine to sit: HOB elevated;Max assist     General bed mobility comments: assist with BLE off bed and to elevate trunk. Use of bed pad to scoot to EOB. +rail, increased time, cues for sequencing  Transfers Overall transfer level: Needs assistance   Transfers: Sit to/from Stand;Stand Pivot Transfers Sit to Stand: +2 physical assistance;Max assist;Mod assist Stand pivot transfers: +2 physical assistance;Total assist       General transfer comment: +2 mod/max assist to power up to stand in stedy. Use of stedy for transfer bed to recliner.  Ambulation/Gait             General Gait Details: unable   Stairs             Wheelchair Mobility    Modified Rankin (Stroke Patients Only)       Balance Overall balance assessment: Needs assistance Sitting-balance support: Feet supported;Single extremity supported Sitting balance-Leahy Scale: Poor Sitting balance  - Comments: external support to maintain sitting balance EOB Postural control: Posterior lean   Standing balance-Leahy Scale: Poor Standing balance comment: stedy for transfer                            Cognition Arousal/Alertness: Awake/alert Behavior During Therapy: Flat affect Overall Cognitive Status: No family/caregiver present to determine baseline cognitive functioning Area of Impairment: Problem solving;Memory                     Memory: Decreased short-term memory       Problem Solving: Slow processing;Difficulty sequencing;Requires verbal cues General Comments: mild confusion. Able to follow commands.      Exercises General Exercises - Lower Extremity Ankle Circles/Pumps: AROM;Both;10 reps Heel Slides: AROM;Right;Left;10 reps Hip ABduction/ADduction: AROM;Right;Left;10 reps    General Comments General comments (skin integrity, edema, etc.): Pt on 2L. Desat to 88% during mobility with HR 118. At end of session in recliner, SpO2 95% on 2L and HR 98.      Pertinent Vitals/Pain      Home Living                      Prior Function            PT Goals (current goals can now be found in the care plan section) Acute Rehab PT Goals Patient Stated Goal: get better Progress towards PT goals: Progressing toward goals  Frequency    Min 2X/week      PT Plan Current plan remains appropriate    Co-evaluation              AM-PAC PT "6 Clicks" Mobility   Outcome Measure  Help needed turning from your back to your side while in a flat bed without using bedrails?: A Lot Help needed moving from lying on your back to sitting on the side of a flat bed without using bedrails?: A Lot Help needed moving to and from a bed to a chair (including a wheelchair)?: Total Help needed standing up from a chair using your arms (e.g., wheelchair or bedside chair)?: A Lot Help needed to walk in hospital room?: Total Help needed climbing 3-5  steps with a railing? : Total 6 Click Score: 9    End of Session Equipment Utilized During Treatment: Gait belt Activity Tolerance: Patient tolerated treatment well Patient left: in chair;with call bell/phone within reach Nurse Communication: Mobility status PT Visit Diagnosis: Muscle weakness (generalized) (M62.81);Difficulty in walking, not elsewhere classified (R26.2);Adult, failure to thrive (R62.7);Pain Pain - Right/Left: Right Pain - part of body: Ankle and joints of foot     Time: 0141-0301 PT Time Calculation (min) (ACUTE ONLY): 29 min  Charges:  $Therapeutic Exercise: 8-22 mins $Therapeutic Activity: 8-22 mins                     [name], PT  Office # [phone] Pager [phone]    [name] 03/02/2019, 10:46 AM

## 2019-03-02 NOTE — TOC Progression Note (Signed)
Transition of Care [facility]) - Progression Note    Patient Details  Name: [name] MRN: [id number] Date of Birth: 12-Sep-1940  [facility]) CM/SW Contact  Bartholomew Crews, RN Phone Number: [phone] 03/02/2019, 12:08 PM  Clinical Narrative:    Spoke with daughter, [name], about valid bed offers. [name] selected [facility] - advised that facility would reach out concerning admission paperwork. Patient will need covid test within 48 hours of discharge. TOC team following for transition needs.    Expected Discharge Plan: [place] Barriers to Discharge: Continued Medical Work up  Expected Discharge Plan and Services Expected Discharge Plan: [place] In-house Referral: Clinical Social Work Discharge Planning Services: CM Consult Post Acute Care Choice: [place] arrangements for the past 2 months: Single Family Home                 DME Arranged: N/A DME Agency: NA       HH Arranged: NA HH Agency: NA         Social Determinants of Health (SDOH) Interventions    Readmission Risk Interventions No flowsheet data found.

## 2019-03-03 DIAGNOSIS — R1310 Dysphagia, unspecified: Secondary | ICD-10-CM | POA: Diagnosis not present

## 2019-03-03 DIAGNOSIS — J449 Chronic obstructive pulmonary disease, unspecified: Secondary | ICD-10-CM | POA: Diagnosis present

## 2019-03-03 DIAGNOSIS — E669 Obesity, unspecified: Secondary | ICD-10-CM | POA: Diagnosis present

## 2019-03-03 DIAGNOSIS — J189 Pneumonia, unspecified organism: Secondary | ICD-10-CM | POA: Diagnosis not present

## 2019-03-03 DIAGNOSIS — R5381 Other malaise: Secondary | ICD-10-CM | POA: Diagnosis not present

## 2019-03-03 DIAGNOSIS — I10 Essential (primary) hypertension: Secondary | ICD-10-CM | POA: Diagnosis not present

## 2019-03-03 DIAGNOSIS — R3911 Hesitancy of micturition: Secondary | ICD-10-CM | POA: Diagnosis not present

## 2019-03-03 DIAGNOSIS — M2550 Pain in unspecified joint: Secondary | ICD-10-CM | POA: Diagnosis not present

## 2019-03-03 DIAGNOSIS — K219 Gastro-esophageal reflux disease without esophagitis: Secondary | ICD-10-CM | POA: Diagnosis present

## 2019-03-03 DIAGNOSIS — R488 Other symbolic dysfunctions: Secondary | ICD-10-CM | POA: Diagnosis present

## 2019-03-03 DIAGNOSIS — R2689 Other abnormalities of gait and mobility: Secondary | ICD-10-CM | POA: Diagnosis present

## 2019-03-03 DIAGNOSIS — M6282 Rhabdomyolysis: Secondary | ICD-10-CM | POA: Diagnosis present

## 2019-03-03 DIAGNOSIS — Z741 Need for assistance with personal care: Secondary | ICD-10-CM | POA: Diagnosis present

## 2019-03-03 DIAGNOSIS — Z7401 Bed confinement status: Secondary | ICD-10-CM | POA: Diagnosis not present

## 2019-03-03 DIAGNOSIS — T796XXD Traumatic ischemia of muscle, subsequent encounter: Secondary | ICD-10-CM | POA: Diagnosis not present

## 2019-03-03 DIAGNOSIS — I739 Peripheral vascular disease, unspecified: Secondary | ICD-10-CM | POA: Diagnosis not present

## 2019-03-03 DIAGNOSIS — N189 Chronic kidney disease, unspecified: Secondary | ICD-10-CM | POA: Diagnosis not present

## 2019-03-03 DIAGNOSIS — R1319 Other dysphagia: Secondary | ICD-10-CM | POA: Diagnosis present

## 2019-03-03 DIAGNOSIS — M109 Gout, unspecified: Secondary | ICD-10-CM | POA: Diagnosis not present

## 2019-03-03 DIAGNOSIS — M6281 Muscle weakness (generalized): Secondary | ICD-10-CM | POA: Diagnosis present

## 2019-03-03 DIAGNOSIS — I69391 Dysphagia following cerebral infarction: Secondary | ICD-10-CM | POA: Diagnosis not present

## 2019-03-03 DIAGNOSIS — I509 Heart failure, unspecified: Secondary | ICD-10-CM | POA: Diagnosis not present

## 2019-03-03 DIAGNOSIS — I4891 Unspecified atrial fibrillation: Secondary | ICD-10-CM | POA: Diagnosis not present

## 2019-03-03 DIAGNOSIS — R296 Repeated falls: Secondary | ICD-10-CM | POA: Diagnosis not present

## 2019-03-03 DIAGNOSIS — J9601 Acute respiratory failure with hypoxia: Secondary | ICD-10-CM | POA: Diagnosis present

## 2019-03-03 DIAGNOSIS — J690 Pneumonitis due to inhalation of food and vomit: Secondary | ICD-10-CM | POA: Diagnosis present

## 2019-03-03 DIAGNOSIS — I480 Paroxysmal atrial fibrillation: Secondary | ICD-10-CM | POA: Diagnosis present

## 2019-03-03 DIAGNOSIS — I2510 Atherosclerotic heart disease of native coronary artery without angina pectoris: Secondary | ICD-10-CM | POA: Diagnosis not present

## 2019-03-03 DIAGNOSIS — M2041 Other hammer toe(s) (acquired), right foot: Secondary | ICD-10-CM | POA: Diagnosis not present

## 2019-03-03 MED ORDER — METOPROLOL TARTRATE 25 MG PO TABS: 12.5000 mg | ORAL_TABLET | Freq: Two times a day (BID) | ORAL | Status: DC

## 2019-03-03 MED ORDER — HYDROCODONE-ACETAMINOPHEN 5-325 MG PO TABS: 1.0000 | ORAL_TABLET | Freq: Two times a day (BID) | ORAL | 0 refills | Status: AC | PRN

## 2019-03-03 MED ORDER — DIAZEPAM 2 MG PO TABS: 2.0000 mg | ORAL_TABLET | Freq: Two times a day (BID) | ORAL | 0 refills | Status: AC | PRN

## 2019-03-03 MED ORDER — AMOXICILLIN-POT CLAVULANATE 500-125 MG PO TABS: 1.0000 | ORAL_TABLET | Freq: Two times a day (BID) | ORAL | 0 refills | Status: AC

## 2019-03-03 MED ORDER — DIAZEPAM 2 MG PO TABS: 2.0000 mg | ORAL_TABLET | Freq: Two times a day (BID) | ORAL | 0 refills | Status: DC | PRN

## 2019-03-03 MED ORDER — HYDROCODONE-ACETAMINOPHEN 5-325 MG PO TABS: 1.0000 | ORAL_TABLET | Freq: Two times a day (BID) | ORAL | 0 refills | Status: DC | PRN

## 2019-03-03 NOTE — TOC Transition Note (Signed)
Transition of Care [facility]) - CM/SW Discharge Note   Patient Details  Name: [name] MRN: [id number] Date of Birth: 20-Apr-1940  Transition of Care [facility]) CM/SW Contact:  Bartholomew Crews, RN Phone Number: [phone] 03/03/2019, 11:22 AM   Clinical Narrative:    Patient to transition to [facility] ([facility]) today. Pending family to complete admission paperwork and bed assignment.   Spoke with daughter on the phone to discuss transition today. She has spoken with liaison at [facility] and is awaiting paperwork.   PTAR to be arranged when [facility] ready to accept.   TOC team following for transition needs.    Final next level of care: Skilled Nursing Facility Barriers to Discharge: No Barriers Identified   Patient Goals and CMS Choice Patient states their goals for this hospitalization and ongoing recovery are:: skilled rehab CMS Medicare.gov Compare Post Acute Care list provided to:: Patient Represenative (must comment)([name] Wedding) Choice offered to / list presented to : Adult Children  Discharge Placement   Existing PASRR number confirmed : 02/28/19          Patient chooses bed at: [facility]) Patient to be transferred to facility by: [place] Name of family member notified: [name], daughter Patient and family notified of of transfer: 03/03/19  Discharge Plan and Services In-house Referral: Clinical Social Work Discharge Planning Services: CM Consult Post Acute Care Choice: [place]          DME Arranged: N/A DME Agency: NA       HH Arranged: NA HH Agency: NA        Social Determinants of Health (SDOH) Interventions     Readmission Risk Interventions No flowsheet data found.

## 2019-03-03 NOTE — Progress Notes (Signed)
Report called and given to Heide Scales, RN at [facility].

## 2019-03-03 NOTE — Progress Notes (Signed)
DISCHARGE NOTE SNF [name] to be discharged Skilled nursing facility per MD order. Patient verbalized understanding.  IV catheter discontinued intact. Site without signs and symptoms of complications. Dressing and pressure applied. Pt denies pain at the site currently. No complaints noted.  Patient free of lines, drains, and wounds.   Discharge packet assembled. An After Visit Summary (AVS) was printed and given to the EMS personnel. Patient escorted via stretcher and discharged to [facility] via ambulance. Report called to accepting facility; all questions and concerns addressed.   Aneta Mins BSN, RN3

## 2019-03-03 NOTE — Discharge Summary (Signed)
Physician Discharge Summary  [name] KYH:[id number] DOB: Aug 23, 1940 DOA: 02/25/2019  PCP: Marton Redwood, MD  Admit date: 02/25/2019 Discharge date: 03/03/2019  Time spent: 35 minutes  Recommendations for Outpatient Follow-up:  1. PCP in 1 week, please check CBC/bmet at follow-up 2. Palliative care to be consulted at SNF 3. SLP follow-up at SNF   Discharge Diagnoses:  Aspiration pneumonia Dysphagia Left rectus sheath hematoma Fall Paroxysmal atrial fibrillation Chronic anticoagulation with Eliquis History of CVA x2 Cognitive deficits   HLD (hyperlipidemia)   Anxiety state   TOBACCO ABUSE   Essential hypertension   Coronary atherosclerosis   GASTROESOPHAGEAL REFLUX DISEASE   Rhabdomyolysis   Discharge Condition: Stable  Diet recommendation: Dysphagia 2 diet (finely chopped) with thin liquids, strict aspiration precautions, (finely chopped) solids with thin liquids, aspiration precautions, no straws, small bites/sips, slow rate of intake, sit upright as possible, remain upright for 30 minutes after p.o. intake  Filed Weights   02/26/19 2105 02/28/19 2140  Weight: 122.5 kg 122.5 kg    History of present illness:  Mr. Perkey is a chronically ill 79 year old male with history of paroxysmal atrial fibrillation on apixaban, history of CAD, COPD, CVA x2, dysphagia, anxiety disorder, carotid artery disease, morbid obesity, GERD, hypertension, was brought in by family on 1/2 after a fall, he was unable to get off the floor and was found by family the following day, he complained of pain in his right hip, he was found to be in A. fib RVR and noted to have leukocytosis and hypoxia, CT head hip and C-spine were unrevealing, chest x-ray was concerning for possible pneumonia and also had evidence of rhabdomyolysis -Through his hospital course he complained of left-sided pelvic pain, imaging noted rectus sheath hematoma  Hospital Course:   Acute hypoxic respiratory  failure/pneumonia -Likely aspiration and not community-acquired pneumonia -Treated with broad-spectrum antibiotics, improved clinically -Blood cultures are negative, COVID-19 PCR and influenza panel are also negative -Transition to oral Augmentin, for 3 more days to complete course  Left rectus sheath hematoma -Status post mechanical fall, anticoagulation -Hemoglobin is stable, given extremely high CHA2DS2-VASc score with multiple strokes, elected to continue anticoagulation with apixaban,  -Hemoglobin has been 3 stable to this hospitalization, I called and discussed risk/benefit of continuing anticoagulation, she agreed with continuing apixaban for now  History of CVA/dysphagia -With residual dysphagia, continue dysphagia 2 diet -SLP consulted, felt that he has oropharyngeal dysphagia, recommended dysphagia to (finely chopped) solids with thin liquids, aspiration precautions, no straws, small bites/sips, slow rate of intake, sit upright as possible, remain upright for 30 minutes after p.o. intake -continue apixaban and Lipitor  Paroxysmal atrial fibrillation -Continue apixaban  CAD/PCI and stenting -Continue metoprolol, amlodipine and Imdur  CKD 2-3 -Stable  BPH -Continue Proscar  COPD -stable  Obesity -BMI 37   Code Status: DNR  Consultations:  SLP evaluation  Discharge Exam: Vitals:   03/03/19 0541 03/03/19 0913  BP:  124/73  Pulse: 83 92  Resp:  20  Temp:  97.9 F (36.6 C)  SpO2:  92%    General: Alert awake chronically ill gentleman sitting up in bed, oriented x2, no distress Cardiovascular: S1-S2 regular rhythm Respiratory: Few scattered rhonchi  Discharge Instructions   Discharge Instructions    Increase activity slowly   Complete by: As directed      Allergies as of 03/03/2019      Reactions   Hctz [hydrochlorothiazide] Anaphylaxis, Other (See Comments)   Throat Swelling   Amitriptyline Other (See Comments)  Made me go crazy    Gabapentin Other (See Comments)   Made me go crazy   Metoprolol Tartrate Other (See Comments)   fatigue   Paroxetine Other (See Comments)   Made me go crazy   Spiriva Handihaler [tiotropium Bromide Monohydrate] Other (See Comments)   Urinary retention      Medication List    STOP taking these medications   aspirin EC 81 MG tablet     TAKE these medications   amLODipine 5 MG tablet Commonly known as: NORVASC Take 5 mg by mouth daily.   amoxicillin-clavulanate 500-125 MG tablet Commonly known as: Augmentin Take 1 tablet (500 mg total) by mouth 2 (two) times daily at 10 AM and 5 PM for 3 days.   apixaban 2.5 MG Tabs tablet Commonly known as: Eliquis Take 1 tablet (2.5 mg total) by mouth 2 (two) times daily.   atorvastatin 40 MG tablet Commonly known as: LIPITOR Take 40 mg by mouth at bedtime.   diazepam 2 MG tablet Commonly known as: VALIUM Take 1 tablet (2 mg total) by mouth every 12 (twelve) hours as needed for anxiety. What changed:   when to take this  reasons to take this   finasteride 5 MG tablet Commonly known as: PROSCAR Take 5 mg by mouth 2 (two) times daily.   furosemide 20 MG tablet Commonly known as: LASIX Take 20 mg by mouth 2 (two) times daily.   HYDROcodone-acetaminophen 5-325 MG tablet Commonly known as: NORCO/VICODIN Take 1 tablet by mouth 2 (two) times daily as needed for moderate pain. What changed:   when to take this  reasons to take this   isosorbide mononitrate 60 MG 24 hr tablet Commonly known as: IMDUR Take 1 tablet (60 mg total) by mouth daily.   lubiprostone 8 MCG capsule Commonly known as: AMITIZA Take 8 mcg by mouth 2 (two) times daily with a meal.   Melatonin 5 MG Caps Take 5 mg by mouth at bedtime.   metoprolol tartrate 25 MG tablet Commonly known as: LOPRESSOR Take 0.5 tablets (12.5 mg total) by mouth 2 (two) times daily.   mirtazapine 15 MG tablet Commonly known as: REMERON Take 7.5 mg by mouth at bedtime.    nitroGLYCERIN 0.4 MG SL tablet Commonly known as: NITROSTAT Place 1 tablet (0.4 mg total) under the tongue every 5 (five) minutes as needed for chest pain.   omeprazole 40 MG capsule Commonly known as: PRILOSEC Take 1 capsule (40 mg total) by mouth daily.   potassium chloride SA 20 MEQ tablet Commonly known as: KLOR-CON Take 20 mEq by mouth daily.   sertraline 100 MG tablet Commonly known as: ZOLOFT Take 100 mg by mouth 2 (two) times daily.      Allergies  Allergen Reactions  . Hctz [Hydrochlorothiazide] Anaphylaxis and Other (See Comments)    Throat Swelling  . Amitriptyline Other (See Comments)    Made me go crazy  . Gabapentin Other (See Comments)    Made me go crazy  . Metoprolol Tartrate Other (See Comments)    fatigue  . Paroxetine Other (See Comments)    Made me go crazy  . Spiriva Handihaler [Tiotropium Bromide Monohydrate] Other (See Comments)    Urinary retention   Contact information for after-discharge care    Destination    [facility] .   Service: Skilled Nursing Contact information: [address] [place] [phone]  The results of significant diagnostics from this hospitalization (including imaging, microbiology, ancillary and laboratory) are listed below for reference.    Significant Diagnostic Studies: DG Chest 2 View  Result Date: 02/27/2019 CLINICAL DATA:  Chest heaviness with productive cough EXAM: CHEST - 2 VIEW COMPARISON:  02/25/2019 FINDINGS: Cardiac shadow is stable. Aortic calcifications are again seen. Stable left upper lobe opacity is noted. Mild right upper lobe opacity is noted slightly increased from the prior exam. No sizable effusion is seen. No bony abnormality is noted. IMPRESSION: Bilateral upper lobe infiltrates left greater than right. These are slightly increased on the right when compared with prior exam. Electronically Signed   By: Inez Catalina M.D.    On: 02/27/2019 15:54   DG Lumbar Spine Complete  Result Date: 02/25/2019 CLINICAL DATA:  Right hip pain.  Fall yesterday. EXAM: LUMBAR SPINE - COMPLETE 4+ VIEW COMPARISON:  CT of hip of earlier today. FINDINGS: Atherosclerosis with right common iliac stent. Five lumbar type vertebral bodies. Sacroiliac joints are symmetric. Cholecystectomy. Mild osteopenia. Maintenance of vertebral body height and alignment. Mild spondylosis for age, with facet arthropathy at L4-5 and L5-S1. IMPRESSION: No acute osseous abnormality. Electronically Signed   By: Abigail Miyamoto M.D.   On: 02/25/2019 20:05   DG Abd 1 View  Result Date: 02/26/2019 CLINICAL DATA:  Left lower quadrant abdominal pain. EXAM: ABDOMEN - 1 VIEW COMPARISON:  None. FINDINGS: The bowel gas pattern is normal. No radio-opaque calculi or other significant radiographic abnormality are seen. Advanced vascular calcifications are noted. The patient is likely status post prior cholecystectomy. IMPRESSION: Negative. Electronically Signed   By: Constance Holster M.D.   On: 02/26/2019 17:15   CT Head Wo Contrast  Result Date: 02/25/2019 CLINICAL DATA:  Recent fall EXAM: CT HEAD WITHOUT CONTRAST CT CERVICAL SPINE WITHOUT CONTRAST TECHNIQUE: Multidetector CT imaging of the head and cervical spine was performed following the standard protocol without intravenous contrast. Multiplanar CT image reconstructions of the cervical spine were also generated. COMPARISON:  None. FINDINGS: CT HEAD FINDINGS Brain: Atrophic changes are noted. Lacunar infarct is noted extending from the basal ganglion to the deep white matter on the left. No findings to suggest acute hemorrhage, acute infarction or space-occupying mass lesion are noted. Vascular: No hyperdense vessel or unexpected calcification. Skull: Normal. Negative for fracture or focal lesion. Sinuses/Orbits: No acute finding. Other: None. CT CERVICAL SPINE FINDINGS Alignment: Within normal limits. Skull base and vertebrae: 7  cervical segments are well visualized. Vertebral body height is well maintained. No acute fracture or acute facet abnormality is noted. No sizable facet hypertrophic changes are seen. The odontoid is within normal limits. Soft tissues and spinal canal: Surrounding soft tissue structures demonstrate vascular calcifications. No focal hematoma is seen. Upper chest: Visualized lung apices demonstrate patchy ground-glass infiltrates suspicious for underlying viral pneumonia. Clinical correlation is recommended. Other: None IMPRESSION: CT of the head: Chronic atrophic and ischemic changes without acute abnormality. CT of the cervical spine: Multilevel degenerative change without acute bony abnormality. Biapical ground-glass infiltrates suspicious for underlying viral pneumonia. Electronically Signed   By: Inez Catalina M.D.   On: 02/25/2019 18:04   CT Cervical Spine Wo Contrast  Result Date: 02/25/2019 CLINICAL DATA:  Recent fall EXAM: CT HEAD WITHOUT CONTRAST CT CERVICAL SPINE WITHOUT CONTRAST TECHNIQUE: Multidetector CT imaging of the head and cervical spine was performed following the standard protocol without intravenous contrast. Multiplanar CT image reconstructions of the cervical spine were also generated. COMPARISON:  None. FINDINGS: CT HEAD FINDINGS  Brain: Atrophic changes are noted. Lacunar infarct is noted extending from the basal ganglion to the deep white matter on the left. No findings to suggest acute hemorrhage, acute infarction or space-occupying mass lesion are noted. Vascular: No hyperdense vessel or unexpected calcification. Skull: Normal. Negative for fracture or focal lesion. Sinuses/Orbits: No acute finding. Other: None. CT CERVICAL SPINE FINDINGS Alignment: Within normal limits. Skull base and vertebrae: 7 cervical segments are well visualized. Vertebral body height is well maintained. No acute fracture or acute facet abnormality is noted. No sizable facet hypertrophic changes are seen. The  odontoid is within normal limits. Soft tissues and spinal canal: Surrounding soft tissue structures demonstrate vascular calcifications. No focal hematoma is seen. Upper chest: Visualized lung apices demonstrate patchy ground-glass infiltrates suspicious for underlying viral pneumonia. Clinical correlation is recommended. Other: None IMPRESSION: CT of the head: Chronic atrophic and ischemic changes without acute abnormality. CT of the cervical spine: Multilevel degenerative change without acute bony abnormality. Biapical ground-glass infiltrates suspicious for underlying viral pneumonia. Electronically Signed   By: Inez Catalina M.D.   On: 02/25/2019 18:04   CT HIP LEFT WO CONTRAST  Result Date: 03/02/2019 CLINICAL DATA:  The patient suffered a fall 02/24/2019. Left hip pain. Subsequent encounter. EXAM: CT OF THE LEFT HIP WITHOUT CONTRAST TECHNIQUE: Multidetector CT imaging of the left hip was performed according to the standard protocol. Multiplanar CT image reconstructions were also generated. COMPARISON:  CT chest, abdomen and pelvis 04/23/2006. CT right hip 02/25/2019. FINDINGS: Bones/Joint/Cartilage The left hip is located. No fracture is identified. No avascular necrosis of the femoral head. There is left hip joint space narrowing and a small subchondral cyst in the periphery of the left acetabulum. Degenerative endplate sclerosis is seen at L4-5 and L5-S1. There is a Schmorl's node in the inferior endplate of L4. No hip joint effusion is seen. Ligaments Suboptimally assessed by CT. Muscles and Tendons There is a hematoma in the left rectus sheath measuring approximately 3.8 cm AP x 7.8 cm transverse x 9.5 cm craniocaudal. Soft tissues Imaged intrapelvic contents demonstrate no acute abnormality. Atherosclerosis is noted. IMPRESSION: Negative for hip fracture. No acute bony abnormality is identified. If concern for hip fracture persists, MRI without contrast is a more sensitive test for the detection of  fracture. Left rectus sheath hematoma as described above. Atherosclerosis. These results were called by telephone at the time of interpretation on 03/02/2019 at 7:21 am to provider Aneta Mins, RN, who verbally acknowledged these results. Electronically Signed   By: Inge Rise M.D.   On: 03/02/2019 07:24   CT Hip Right Wo Contrast  Result Date: 02/25/2019 CLINICAL DATA:  Fatty yesterday with hip pain, initial encounter EXAM: CT OF THE RIGHT HIP WITHOUT CONTRAST TECHNIQUE: Multidetector CT imaging of the right hip was performed according to the standard protocol. Multiplanar CT image reconstructions were also generated. COMPARISON:  Plain film from earlier in the same day. FINDINGS: Bones/Joint/Cartilage No acute fracture or dislocation is noted. Mild degenerative changes are noted in the lower lumbar spine. No acute bony abnormality is seen. Ligaments Suboptimally assessed by CT. Muscles and Tendons Surrounding musculature is within normal limits. Soft tissues No joint effusion is seen. The visualized pelvic structures appear within normal limits. IMPRESSION: No acute fracture is noted. Electronically Signed   By: Inez Catalina M.D.   On: 02/25/2019 18:17   DG Chest Port 1 View  Result Date: 02/25/2019 CLINICAL DATA:  Shortness of breath. EXAM: PORTABLE CHEST 1 VIEW COMPARISON:  February 21, 2016. FINDINGS: The heart size and mediastinal contours are within normal limits. No pneumothorax or pleural effusion is noted. Atherosclerosis of thoracic aorta is noted. Right lung is clear. Mild left upper lobe opacity is noted concerning for possible pneumonia. The visualized skeletal structures are unremarkable. IMPRESSION: Aortic atherosclerosis. Mild left upper lobe opacity is noted concerning for possible pneumonia. Electronically Signed   By: Marijo Conception M.D.   On: 02/25/2019 15:44   DG Hip Unilat With Pelvis 2-3 Views Right  Result Date: 02/25/2019 CLINICAL DATA:  Recent fall, found down EXAM: DG  HIP (WITH OR WITHOUT PELVIS) 2-3V RIGHT COMPARISON:  10/07/2006 FINDINGS: Bones are osteopenic and degenerative changes noted of the lower lumbar spine, SI joints and both hips. Hips appear symmetric and intact. No displaced fracture or malalignment. Bony pelvis intact. Cross-table lateral view is limited because of overlying soft tissues. Aortoiliac and femoral atherosclerosis noted. IMPRESSION: Degenerative changes and osteopenia. No definite acute osseous finding by plain radiography Atherosclerosis Electronically Signed   By: Jerilynn Mages.  Shick M.D.   On: 02/25/2019 15:43    Microbiology: Recent Results (from the past 240 hour(s))  Respiratory Panel by RT PCR (Flu A&B, Covid) - Nasopharyngeal Swab     Status: None   Collection Time: 02/25/19  2:24 PM   Specimen: Nasopharyngeal Swab  Result Value Ref Range Status   SARS Coronavirus 2 by RT PCR NEGATIVE NEGATIVE Final    Comment: (NOTE) SARS-CoV-2 target nucleic acids are NOT DETECTED. The SARS-CoV-2 RNA is generally detectable in upper respiratoy specimens during the acute phase of infection. The lowest concentration of SARS-CoV-2 viral copies this assay can detect is 131 copies/mL. A negative result does not preclude SARS-Cov-2 infection and should not be used as the sole basis for treatment or other patient management decisions. A negative result may occur with  improper specimen collection/handling, submission of specimen other than nasopharyngeal swab, presence of viral mutation(s) within the areas targeted by this assay, and inadequate number of viral copies (<131 copies/mL). A negative result must be combined with clinical observations, patient history, and epidemiological information. The expected result is Negative. Fact Sheet for Patients:  PinkCheek.be Fact Sheet for Healthcare Providers:  GravelBags.it This test is not yet ap proved or cleared by the [place] FDA and  has  been authorized for detection and/or diagnosis of SARS-CoV-2 by FDA under an Emergency Use Authorization (EUA). This EUA will remain  in effect (meaning this test can be used) for the duration of the COVID-19 declaration under Section 564(b)(1) of the Act, 21 U.S.C. section 360bbb-3(b)(1), unless the authorization is terminated or revoked sooner.    Influenza A by PCR NEGATIVE NEGATIVE Final   Influenza B by PCR NEGATIVE NEGATIVE Final    Comment: (NOTE) The Xpert Xpress SARS-CoV-2/FLU/RSV assay is intended as an aid in  the diagnosis of influenza from Nasopharyngeal swab specimens and  should not be used as a sole basis for treatment. Nasal washings and  aspirates are unacceptable for Xpert Xpress SARS-CoV-2/FLU/RSV  testing. Fact Sheet for Patients: PinkCheek.be Fact Sheet for Healthcare Providers: GravelBags.it This test is not yet approved or cleared by the [place] FDA and  has been authorized for detection and/or diagnosis of SARS-CoV-2 by  FDA under an Emergency Use Authorization (EUA). This EUA will remain  in effect (meaning this test can be used) for the duration of the  Covid-19 declaration under Section 564(b)(1) of the Act, 21  U.S.C. section 360bbb-3(b)(1), unless the authorization is  terminated or  revoked. Performed at [facility], [place] [address] [place], [place] [address]   SARS CORONAVIRUS 2 (TAT 6-24 HRS) Nasopharyngeal Nasopharyngeal Swab     Status: None   Collection Time: 03/02/19  5:22 PM   Specimen: Nasopharyngeal Swab  Result Value Ref Range Status   SARS Coronavirus 2 NEGATIVE NEGATIVE Final    Comment: (NOTE) SARS-CoV-2 target nucleic acids are NOT DETECTED. The SARS-CoV-2 RNA is generally detectable in upper and lower respiratory specimens during the acute phase of infection. Negative results do not preclude SARS-CoV-2 infection, do not rule out co-infections with other pathogens,  and should not be used as the sole basis for treatment or other patient management decisions. Negative results must be combined with clinical observations, patient history, and epidemiological information. The expected result is Negative. Fact Sheet for Patients: SugarRoll.be Fact Sheet for Healthcare Providers: https://www.woods-mathews.com/ This test is not yet approved or cleared by the [place] FDA and  has been authorized for detection and/or diagnosis of SARS-CoV-2 by FDA under an Emergency Use Authorization (EUA). This EUA will remain  in effect (meaning this test can be used) for the duration of the COVID-19 declaration under Section 56 4(b)(1) of the Act, 21 U.S.C. section 360bbb-3(b)(1), unless the authorization is terminated or revoked sooner. Performed at [facility], [place] [address] [place], [place] [address]      Labs: Basic Metabolic Panel: Recent Labs  Lab 02/26/19 1531 02/27/19 0827 02/28/19 0440 03/01/19 0314 03/02/19 0417  NA 142 143 142 140 142  K 4.3 4.2 4.1 3.7 4.1  CL 113* 114* 113* 107 111  CO2 22 19* 20* 21* 20*  GLUCOSE 118* 100* 100* 107* 107*  BUN 30* 28* 29* 31* 29*  CREATININE 1.61* 1.49* 1.57* 1.63* 1.57*  CALCIUM 8.4* 8.7* 8.7* 8.5* 8.8*  MG  --  2.1  --   --   --   PHOS  --  2.0*  --   --   --    Liver Function Tests: Recent Labs  Lab 02/27/19 0827 02/28/19 0440 03/01/19 0314 03/02/19 0417  AST  --  74* 77* 97*  ALT  --  42 51* 70*  ALKPHOS  --  63 67 71  BILITOT  --  0.9 0.6 0.7  PROT  --  6.4* 6.5 6.8  ALBUMIN 2.8* 2.6* 2.6* 2.5*   No results for input(s): LIPASE, AMYLASE in the last 168 hours. No results for input(s): AMMONIA in the last 168 hours. CBC: Recent Labs  Lab 02/25/19 1615 02/26/19 1245 02/27/19 0827 02/28/19 0440 03/01/19 0314 03/02/19 0417  WBC 17.3* 13.4* 12.4* 10.4 11.5* 10.0  NEUTROABS 15.0*  --   --  7.9* 8.7* 7.7  HGB 12.2* 10.8* 10.7* 9.3* 9.9*  9.6*  HCT 37.7* 34.5* 34.4* 30.8* 31.7* 30.4*  MCV 84.9 84.6 85.8 87.5 85.2 85.6  PLT 246 234 231 213 236 260   Cardiac Enzymes: Recent Labs  Lab 02/25/19 1615 02/27/19 1622  CKTOTAL 6,094* 1,786*   BNP: BNP (last 3 results) Recent Labs    02/25/19 1615  BNP 157.3*    ProBNP (last 3 results) No results for input(s): PROBNP in the last 8760 hours.  CBG: No results for input(s): GLUCAP in the last 168 hours.   Signed:  Domenic Polite MD.  [facility] 03/03/2019, 10:31 AM

## 2019-03-03 NOTE — TOC Transition Note (Signed)
Transition of Care [facility]) - CM/SW Discharge Note   Patient Details  Name: [name] MRN: [id number] Date of Birth: [birth year]  [facility] CM/SW Contact:  Bartholomew Crews, RN Phone Number: [phone] 03/03/2019, 11:57 AM   Clinical Narrative:     Notified by Wandra Feinstein that patient will be going to room 127. RN to call report to [phone].    Final next level of care: Skilled Nursing Facility Barriers to Discharge: No Barriers Identified   Patient Goals and CMS Choice Patient states their goals for this hospitalization and ongoing recovery are:: skilled rehab CMS Medicare.gov Compare Post Acute Care list provided to:: Patient Represenative (must comment)([name] Wedding) Choice offered to / list presented to : Adult Children  Discharge Placement   Existing PASRR number confirmed : 02/28/19          Patient [facility] bed at: [facility]) Patient to be transferred to facility by: [facility] Name of family member notified: [name], daughter Patient and family notified of of transfer: 03/03/19  Discharge Plan and Services In-house Referral: Clinical Social Work Discharge Planning Services: CM Consult Post Acute Care Choice: [facility]          DME Arranged: N/A DME Agency: NA       HH Arranged: NA HH Agency: NA        Social Determinants of Health (SDOH) Interventions     Readmission Risk Interventions No flowsheet data found.

## 2019-03-03 NOTE — Progress Notes (Signed)
  Speech Language Pathology Treatment: Dysphagia  Patient Details Name: [name] MRN: [id number] DOB: [birth year] Today's Date: 03/03/2019 Time: 0940-7680 SLP Time Calculation (min) (ACUTE ONLY): 23 min  Assessment / Plan / Recommendation Clinical Impression  Patient seen during breakfast today. He reports feeling much better today. Able to converse with SLP about his hospital course. Observations of Dys 2, thin liquid diet revealed no s/s of aspiration. No coughing and clear vocal quality throughout intake. Reviewed aspiration precautions, no use of straw. Patient able to repeat recommendation.  He reported difficulty with grapes several days ago, as they were too hard to masticate and swallow. He feels he is tolerating a finely/ground diet. ST to continue to follow patient for compensatory strategy training and diet tolerance.   HPI HPI: [name] is a 79 y.o. male that presents after a fall in the bathroom. Pt was hypoxic to the 70s on arrival. Noted to have PNA. PMH includes atrial fibrillation, bradycardia, anxiety disorder, carotid artery occlusion, COPD, hypertension, GERD, morbid obesity.  Pt completed a MBS on 03/01/2014 and it was remarkable for laryngeal penetration of thin liquids that was cleared with a chin tuck.        SLP Plan  Continue with current plan of care       Recommendations  Diet recommendations: Dysphagia 2 (fine chop);Thin liquid Medication Administration: Whole meds with puree Compensations: Minimize environmental distractions;Slow rate;Small sips/bites                Oral Care Recommendations: Oral care BID Follow up Recommendations: Skilled Nursing facility;24 hour supervision/assistance SLP Visit Diagnosis: Dysphagia, oropharyngeal phase (R13.12) Plan: Continue with current plan of care       GO                Wynelle Bourgeois., MA, CCC-SLP 03/03/2019, 10:05 AM

## 2019-03-09 DIAGNOSIS — M6282 Rhabdomyolysis: Secondary | ICD-10-CM | POA: Diagnosis not present

## 2019-03-09 DIAGNOSIS — R1310 Dysphagia, unspecified: Secondary | ICD-10-CM | POA: Diagnosis not present

## 2019-03-09 DIAGNOSIS — J189 Pneumonia, unspecified organism: Secondary | ICD-10-CM | POA: Diagnosis not present

## 2019-03-09 DIAGNOSIS — I10 Essential (primary) hypertension: Secondary | ICD-10-CM | POA: Diagnosis not present

## 2019-03-09 DIAGNOSIS — I4891 Unspecified atrial fibrillation: Secondary | ICD-10-CM | POA: Diagnosis not present

## 2019-03-10 DIAGNOSIS — I4891 Unspecified atrial fibrillation: Secondary | ICD-10-CM | POA: Diagnosis not present

## 2019-03-10 DIAGNOSIS — R1310 Dysphagia, unspecified: Secondary | ICD-10-CM | POA: Diagnosis not present

## 2019-03-10 DIAGNOSIS — R5381 Other malaise: Secondary | ICD-10-CM | POA: Diagnosis not present

## 2019-03-10 DIAGNOSIS — J189 Pneumonia, unspecified organism: Secondary | ICD-10-CM | POA: Diagnosis not present

## 2019-03-24 DIAGNOSIS — I4891 Unspecified atrial fibrillation: Secondary | ICD-10-CM | POA: Diagnosis not present

## 2019-03-24 DIAGNOSIS — M6282 Rhabdomyolysis: Secondary | ICD-10-CM | POA: Diagnosis not present

## 2019-03-24 DIAGNOSIS — I10 Essential (primary) hypertension: Secondary | ICD-10-CM | POA: Diagnosis not present

## 2019-03-24 DIAGNOSIS — R1310 Dysphagia, unspecified: Secondary | ICD-10-CM | POA: Diagnosis not present

## 2019-03-26 DIAGNOSIS — I4891 Unspecified atrial fibrillation: Secondary | ICD-10-CM | POA: Diagnosis not present

## 2019-03-26 DIAGNOSIS — N189 Chronic kidney disease, unspecified: Secondary | ICD-10-CM | POA: Diagnosis not present

## 2019-03-26 DIAGNOSIS — I10 Essential (primary) hypertension: Secondary | ICD-10-CM | POA: Diagnosis not present

## 2019-03-26 DIAGNOSIS — I509 Heart failure, unspecified: Secondary | ICD-10-CM | POA: Diagnosis not present

## 2019-03-31 DIAGNOSIS — I4891 Unspecified atrial fibrillation: Secondary | ICD-10-CM | POA: Diagnosis not present

## 2019-03-31 DIAGNOSIS — I739 Peripheral vascular disease, unspecified: Secondary | ICD-10-CM | POA: Diagnosis not present

## 2019-03-31 DIAGNOSIS — R5381 Other malaise: Secondary | ICD-10-CM | POA: Diagnosis not present

## 2019-03-31 DIAGNOSIS — R1310 Dysphagia, unspecified: Secondary | ICD-10-CM | POA: Diagnosis not present

## 2019-04-02 DIAGNOSIS — N189 Chronic kidney disease, unspecified: Secondary | ICD-10-CM | POA: Diagnosis not present

## 2019-04-02 DIAGNOSIS — I10 Essential (primary) hypertension: Secondary | ICD-10-CM | POA: Diagnosis not present

## 2019-04-02 DIAGNOSIS — I4891 Unspecified atrial fibrillation: Secondary | ICD-10-CM | POA: Diagnosis not present

## 2019-04-02 DIAGNOSIS — I509 Heart failure, unspecified: Secondary | ICD-10-CM | POA: Diagnosis not present

## 2019-04-06 DIAGNOSIS — R1310 Dysphagia, unspecified: Secondary | ICD-10-CM | POA: Diagnosis not present

## 2019-04-06 DIAGNOSIS — M109 Gout, unspecified: Secondary | ICD-10-CM | POA: Diagnosis not present

## 2019-04-06 DIAGNOSIS — R5381 Other malaise: Secondary | ICD-10-CM | POA: Diagnosis not present

## 2019-04-06 DIAGNOSIS — I4891 Unspecified atrial fibrillation: Secondary | ICD-10-CM | POA: Diagnosis not present

## 2019-04-07 DIAGNOSIS — R296 Repeated falls: Secondary | ICD-10-CM | POA: Diagnosis not present

## 2019-04-07 DIAGNOSIS — R5381 Other malaise: Secondary | ICD-10-CM | POA: Diagnosis not present

## 2019-04-07 DIAGNOSIS — I10 Essential (primary) hypertension: Secondary | ICD-10-CM | POA: Diagnosis not present

## 2019-04-07 DIAGNOSIS — I739 Peripheral vascular disease, unspecified: Secondary | ICD-10-CM | POA: Diagnosis not present

## 2019-04-13 DIAGNOSIS — R3911 Hesitancy of micturition: Secondary | ICD-10-CM | POA: Diagnosis not present

## 2019-04-13 DIAGNOSIS — R5381 Other malaise: Secondary | ICD-10-CM | POA: Diagnosis not present

## 2019-04-13 DIAGNOSIS — I10 Essential (primary) hypertension: Secondary | ICD-10-CM | POA: Diagnosis not present

## 2019-04-13 DIAGNOSIS — R296 Repeated falls: Secondary | ICD-10-CM | POA: Diagnosis not present

## 2019-04-19 ENCOUNTER — Ambulatory Visit: Payer: Medicare Other | Admitting: Cardiovascular Disease

## 2019-04-20 ENCOUNTER — Ambulatory Visit: Payer: Medicare Other | Admitting: Podiatry

## 2019-04-26 DIAGNOSIS — I10 Essential (primary) hypertension: Secondary | ICD-10-CM | POA: Diagnosis not present

## 2019-04-26 DIAGNOSIS — I739 Peripheral vascular disease, unspecified: Secondary | ICD-10-CM | POA: Diagnosis not present

## 2019-04-26 DIAGNOSIS — I4891 Unspecified atrial fibrillation: Secondary | ICD-10-CM | POA: Diagnosis not present

## 2019-04-26 DIAGNOSIS — M2041 Other hammer toe(s) (acquired), right foot: Secondary | ICD-10-CM | POA: Diagnosis not present

## 2019-04-28 DIAGNOSIS — I739 Peripheral vascular disease, unspecified: Secondary | ICD-10-CM | POA: Diagnosis not present

## 2019-04-28 DIAGNOSIS — M6282 Rhabdomyolysis: Secondary | ICD-10-CM | POA: Diagnosis not present

## 2019-04-28 DIAGNOSIS — F329 Major depressive disorder, single episode, unspecified: Secondary | ICD-10-CM | POA: Diagnosis not present

## 2019-04-28 DIAGNOSIS — Z6830 Body mass index (BMI) 30.0-30.9, adult: Secondary | ICD-10-CM | POA: Diagnosis not present

## 2019-04-28 DIAGNOSIS — I480 Paroxysmal atrial fibrillation: Secondary | ICD-10-CM | POA: Diagnosis not present

## 2019-04-28 DIAGNOSIS — N183 Chronic kidney disease, stage 3 unspecified: Secondary | ICD-10-CM | POA: Diagnosis not present

## 2019-04-28 DIAGNOSIS — K219 Gastro-esophageal reflux disease without esophagitis: Secondary | ICD-10-CM | POA: Diagnosis not present

## 2019-04-28 DIAGNOSIS — G3184 Mild cognitive impairment, so stated: Secondary | ICD-10-CM | POA: Diagnosis not present

## 2019-04-28 DIAGNOSIS — G629 Polyneuropathy, unspecified: Secondary | ICD-10-CM | POA: Diagnosis not present

## 2019-04-28 DIAGNOSIS — J449 Chronic obstructive pulmonary disease, unspecified: Secondary | ICD-10-CM | POA: Diagnosis not present

## 2019-04-28 DIAGNOSIS — I130 Hypertensive heart and chronic kidney disease with heart failure and stage 1 through stage 4 chronic kidney disease, or unspecified chronic kidney disease: Secondary | ICD-10-CM | POA: Diagnosis not present

## 2019-04-28 DIAGNOSIS — I2510 Atherosclerotic heart disease of native coronary artery without angina pectoris: Secondary | ICD-10-CM | POA: Diagnosis not present

## 2019-04-28 DIAGNOSIS — J698 Pneumonitis due to inhalation of other solids and liquids: Secondary | ICD-10-CM | POA: Diagnosis not present

## 2019-04-28 DIAGNOSIS — I509 Heart failure, unspecified: Secondary | ICD-10-CM | POA: Diagnosis not present

## 2019-04-28 DIAGNOSIS — R1310 Dysphagia, unspecified: Secondary | ICD-10-CM | POA: Diagnosis not present

## 2019-04-28 DIAGNOSIS — E6609 Other obesity due to excess calories: Secondary | ICD-10-CM | POA: Diagnosis not present

## 2019-04-28 DIAGNOSIS — I6529 Occlusion and stenosis of unspecified carotid artery: Secondary | ICD-10-CM | POA: Diagnosis not present

## 2019-04-28 DIAGNOSIS — I69391 Dysphagia following cerebral infarction: Secondary | ICD-10-CM | POA: Diagnosis not present

## 2019-04-28 DIAGNOSIS — F419 Anxiety disorder, unspecified: Secondary | ICD-10-CM | POA: Diagnosis not present

## 2019-04-28 DIAGNOSIS — F17200 Nicotine dependence, unspecified, uncomplicated: Secondary | ICD-10-CM | POA: Diagnosis not present

## 2019-04-28 DIAGNOSIS — J9600 Acute respiratory failure, unspecified whether with hypoxia or hypercapnia: Secondary | ICD-10-CM | POA: Diagnosis not present

## 2019-04-28 DIAGNOSIS — E440 Moderate protein-calorie malnutrition: Secondary | ICD-10-CM | POA: Diagnosis not present

## 2019-04-28 DIAGNOSIS — I252 Old myocardial infarction: Secondary | ICD-10-CM | POA: Diagnosis not present

## 2019-04-28 DIAGNOSIS — G894 Chronic pain syndrome: Secondary | ICD-10-CM | POA: Diagnosis not present

## 2019-04-28 DIAGNOSIS — N400 Enlarged prostate without lower urinary tract symptoms: Secondary | ICD-10-CM | POA: Diagnosis not present

## 2019-04-29 DIAGNOSIS — R1310 Dysphagia, unspecified: Secondary | ICD-10-CM | POA: Diagnosis not present

## 2019-04-29 DIAGNOSIS — J449 Chronic obstructive pulmonary disease, unspecified: Secondary | ICD-10-CM | POA: Diagnosis not present

## 2019-04-29 DIAGNOSIS — J698 Pneumonitis due to inhalation of other solids and liquids: Secondary | ICD-10-CM | POA: Diagnosis not present

## 2019-04-29 DIAGNOSIS — M6282 Rhabdomyolysis: Secondary | ICD-10-CM | POA: Diagnosis not present

## 2019-04-29 DIAGNOSIS — J9600 Acute respiratory failure, unspecified whether with hypoxia or hypercapnia: Secondary | ICD-10-CM | POA: Diagnosis not present

## 2019-04-29 DIAGNOSIS — I69391 Dysphagia following cerebral infarction: Secondary | ICD-10-CM | POA: Diagnosis not present

## 2019-05-01 DIAGNOSIS — M6282 Rhabdomyolysis: Secondary | ICD-10-CM | POA: Diagnosis not present

## 2019-05-01 DIAGNOSIS — J9600 Acute respiratory failure, unspecified whether with hypoxia or hypercapnia: Secondary | ICD-10-CM | POA: Diagnosis not present

## 2019-05-01 DIAGNOSIS — R1310 Dysphagia, unspecified: Secondary | ICD-10-CM | POA: Diagnosis not present

## 2019-05-01 DIAGNOSIS — I69391 Dysphagia following cerebral infarction: Secondary | ICD-10-CM | POA: Diagnosis not present

## 2019-05-01 DIAGNOSIS — J449 Chronic obstructive pulmonary disease, unspecified: Secondary | ICD-10-CM | POA: Diagnosis not present

## 2019-05-01 DIAGNOSIS — J698 Pneumonitis due to inhalation of other solids and liquids: Secondary | ICD-10-CM | POA: Diagnosis not present

## 2019-05-02 DIAGNOSIS — R1310 Dysphagia, unspecified: Secondary | ICD-10-CM | POA: Diagnosis not present

## 2019-05-02 DIAGNOSIS — J449 Chronic obstructive pulmonary disease, unspecified: Secondary | ICD-10-CM | POA: Diagnosis not present

## 2019-05-02 DIAGNOSIS — J9600 Acute respiratory failure, unspecified whether with hypoxia or hypercapnia: Secondary | ICD-10-CM | POA: Diagnosis not present

## 2019-05-02 DIAGNOSIS — I69391 Dysphagia following cerebral infarction: Secondary | ICD-10-CM | POA: Diagnosis not present

## 2019-05-02 DIAGNOSIS — M6282 Rhabdomyolysis: Secondary | ICD-10-CM | POA: Diagnosis not present

## 2019-05-02 DIAGNOSIS — J698 Pneumonitis due to inhalation of other solids and liquids: Secondary | ICD-10-CM | POA: Diagnosis not present

## 2019-05-03 DIAGNOSIS — J698 Pneumonitis due to inhalation of other solids and liquids: Secondary | ICD-10-CM | POA: Diagnosis not present

## 2019-05-03 DIAGNOSIS — J9600 Acute respiratory failure, unspecified whether with hypoxia or hypercapnia: Secondary | ICD-10-CM | POA: Diagnosis not present

## 2019-05-03 DIAGNOSIS — I69391 Dysphagia following cerebral infarction: Secondary | ICD-10-CM | POA: Diagnosis not present

## 2019-05-03 DIAGNOSIS — M6282 Rhabdomyolysis: Secondary | ICD-10-CM | POA: Diagnosis not present

## 2019-05-03 DIAGNOSIS — J449 Chronic obstructive pulmonary disease, unspecified: Secondary | ICD-10-CM | POA: Diagnosis not present

## 2019-05-03 DIAGNOSIS — R1310 Dysphagia, unspecified: Secondary | ICD-10-CM | POA: Diagnosis not present

## 2019-05-04 DIAGNOSIS — R1310 Dysphagia, unspecified: Secondary | ICD-10-CM | POA: Diagnosis not present

## 2019-05-04 DIAGNOSIS — J698 Pneumonitis due to inhalation of other solids and liquids: Secondary | ICD-10-CM | POA: Diagnosis not present

## 2019-05-04 DIAGNOSIS — I69391 Dysphagia following cerebral infarction: Secondary | ICD-10-CM | POA: Diagnosis not present

## 2019-05-04 DIAGNOSIS — J9600 Acute respiratory failure, unspecified whether with hypoxia or hypercapnia: Secondary | ICD-10-CM | POA: Diagnosis not present

## 2019-05-04 DIAGNOSIS — J449 Chronic obstructive pulmonary disease, unspecified: Secondary | ICD-10-CM | POA: Diagnosis not present

## 2019-05-04 DIAGNOSIS — M6282 Rhabdomyolysis: Secondary | ICD-10-CM | POA: Diagnosis not present

## 2019-05-05 DIAGNOSIS — J698 Pneumonitis due to inhalation of other solids and liquids: Secondary | ICD-10-CM | POA: Diagnosis not present

## 2019-05-05 DIAGNOSIS — K5909 Other constipation: Secondary | ICD-10-CM | POA: Diagnosis not present

## 2019-05-05 DIAGNOSIS — D6869 Other thrombophilia: Secondary | ICD-10-CM | POA: Diagnosis not present

## 2019-05-05 DIAGNOSIS — R1310 Dysphagia, unspecified: Secondary | ICD-10-CM | POA: Diagnosis not present

## 2019-05-05 DIAGNOSIS — R0609 Other forms of dyspnea: Secondary | ICD-10-CM | POA: Diagnosis not present

## 2019-05-05 DIAGNOSIS — I509 Heart failure, unspecified: Secondary | ICD-10-CM | POA: Diagnosis not present

## 2019-05-05 DIAGNOSIS — I480 Paroxysmal atrial fibrillation: Secondary | ICD-10-CM | POA: Diagnosis not present

## 2019-05-05 DIAGNOSIS — I69391 Dysphagia following cerebral infarction: Secondary | ICD-10-CM | POA: Diagnosis not present

## 2019-05-05 DIAGNOSIS — M109 Gout, unspecified: Secondary | ICD-10-CM | POA: Diagnosis not present

## 2019-05-05 DIAGNOSIS — N182 Chronic kidney disease, stage 2 (mild): Secondary | ICD-10-CM | POA: Diagnosis not present

## 2019-05-05 DIAGNOSIS — J9600 Acute respiratory failure, unspecified whether with hypoxia or hypercapnia: Secondary | ICD-10-CM | POA: Diagnosis not present

## 2019-05-05 DIAGNOSIS — J449 Chronic obstructive pulmonary disease, unspecified: Secondary | ICD-10-CM | POA: Diagnosis not present

## 2019-05-05 DIAGNOSIS — M6282 Rhabdomyolysis: Secondary | ICD-10-CM | POA: Diagnosis not present

## 2019-05-05 DIAGNOSIS — I130 Hypertensive heart and chronic kidney disease with heart failure and stage 1 through stage 4 chronic kidney disease, or unspecified chronic kidney disease: Secondary | ICD-10-CM | POA: Diagnosis not present

## 2019-05-05 DIAGNOSIS — R0600 Dyspnea, unspecified: Secondary | ICD-10-CM | POA: Diagnosis not present

## 2019-05-08 DIAGNOSIS — M6282 Rhabdomyolysis: Secondary | ICD-10-CM | POA: Diagnosis not present

## 2019-05-08 DIAGNOSIS — R1310 Dysphagia, unspecified: Secondary | ICD-10-CM | POA: Diagnosis not present

## 2019-05-08 DIAGNOSIS — I69391 Dysphagia following cerebral infarction: Secondary | ICD-10-CM | POA: Diagnosis not present

## 2019-05-08 DIAGNOSIS — J449 Chronic obstructive pulmonary disease, unspecified: Secondary | ICD-10-CM | POA: Diagnosis not present

## 2019-05-08 DIAGNOSIS — J9600 Acute respiratory failure, unspecified whether with hypoxia or hypercapnia: Secondary | ICD-10-CM | POA: Diagnosis not present

## 2019-05-08 DIAGNOSIS — J698 Pneumonitis due to inhalation of other solids and liquids: Secondary | ICD-10-CM | POA: Diagnosis not present

## 2019-05-09 ENCOUNTER — Ambulatory Visit: Payer: Medicare Other | Admitting: Podiatry

## 2019-05-09 DIAGNOSIS — M6282 Rhabdomyolysis: Secondary | ICD-10-CM | POA: Diagnosis not present

## 2019-05-09 DIAGNOSIS — I69391 Dysphagia following cerebral infarction: Secondary | ICD-10-CM | POA: Diagnosis not present

## 2019-05-09 DIAGNOSIS — R1310 Dysphagia, unspecified: Secondary | ICD-10-CM | POA: Diagnosis not present

## 2019-05-09 DIAGNOSIS — J698 Pneumonitis due to inhalation of other solids and liquids: Secondary | ICD-10-CM | POA: Diagnosis not present

## 2019-05-09 DIAGNOSIS — J449 Chronic obstructive pulmonary disease, unspecified: Secondary | ICD-10-CM | POA: Diagnosis not present

## 2019-05-09 DIAGNOSIS — J9600 Acute respiratory failure, unspecified whether with hypoxia or hypercapnia: Secondary | ICD-10-CM | POA: Diagnosis not present

## 2019-05-11 DIAGNOSIS — J449 Chronic obstructive pulmonary disease, unspecified: Secondary | ICD-10-CM | POA: Diagnosis not present

## 2019-05-11 DIAGNOSIS — R1310 Dysphagia, unspecified: Secondary | ICD-10-CM | POA: Diagnosis not present

## 2019-05-11 DIAGNOSIS — M6282 Rhabdomyolysis: Secondary | ICD-10-CM | POA: Diagnosis not present

## 2019-05-11 DIAGNOSIS — I69391 Dysphagia following cerebral infarction: Secondary | ICD-10-CM | POA: Diagnosis not present

## 2019-05-11 DIAGNOSIS — J9600 Acute respiratory failure, unspecified whether with hypoxia or hypercapnia: Secondary | ICD-10-CM | POA: Diagnosis not present

## 2019-05-11 DIAGNOSIS — J698 Pneumonitis due to inhalation of other solids and liquids: Secondary | ICD-10-CM | POA: Diagnosis not present

## 2019-05-16 DIAGNOSIS — J9600 Acute respiratory failure, unspecified whether with hypoxia or hypercapnia: Secondary | ICD-10-CM | POA: Diagnosis not present

## 2019-05-16 DIAGNOSIS — I69391 Dysphagia following cerebral infarction: Secondary | ICD-10-CM | POA: Diagnosis not present

## 2019-05-16 DIAGNOSIS — R1310 Dysphagia, unspecified: Secondary | ICD-10-CM | POA: Diagnosis not present

## 2019-05-16 DIAGNOSIS — M6282 Rhabdomyolysis: Secondary | ICD-10-CM | POA: Diagnosis not present

## 2019-05-16 DIAGNOSIS — J698 Pneumonitis due to inhalation of other solids and liquids: Secondary | ICD-10-CM | POA: Diagnosis not present

## 2019-05-16 DIAGNOSIS — J449 Chronic obstructive pulmonary disease, unspecified: Secondary | ICD-10-CM | POA: Diagnosis not present

## 2019-05-17 ENCOUNTER — Ambulatory Visit: Payer: Self-pay | Admitting: Pharmacist

## 2019-05-17 ENCOUNTER — Other Ambulatory Visit: Payer: Self-pay | Admitting: Pharmacist

## 2019-05-17 NOTE — Patient Outreach (Signed)
[place] [facility]) Care Management  05/17/2019  DAIL LEREW 1940/12/03 [id number]   Patient's daughter [name]) was called per referral at her mobile number. Unfortunately, she did not answer the phone. HIPAA compliant message will be left on her voicemail.  Plan: Call patient back in 3-5 business days.  Elayne Guerin, PharmD, [place] Clinical Pharmacist [phone]

## 2019-05-18 ENCOUNTER — Other Ambulatory Visit: Payer: Self-pay | Admitting: Pharmacist

## 2019-05-18 NOTE — Patient Outreach (Signed)
[place] [facility]) Care Management  05/18/2019  [name] [birth year] [id number]   Called patient's daughter, [name] back per referral. Unfortunately, she did not answer the phone again. HIPAA compliant message was left on her voicemail.  Will call the patient back in 3-5 business days.  Elayne Guerin, PharmD, [place] Clinical Pharmacist [phone]

## 2019-05-19 ENCOUNTER — Other Ambulatory Visit: Payer: Self-pay | Admitting: *Deleted

## 2019-05-19 DIAGNOSIS — M6282 Rhabdomyolysis: Secondary | ICD-10-CM | POA: Diagnosis not present

## 2019-05-19 DIAGNOSIS — R1310 Dysphagia, unspecified: Secondary | ICD-10-CM | POA: Diagnosis not present

## 2019-05-19 DIAGNOSIS — J449 Chronic obstructive pulmonary disease, unspecified: Secondary | ICD-10-CM | POA: Diagnosis not present

## 2019-05-19 DIAGNOSIS — I69391 Dysphagia following cerebral infarction: Secondary | ICD-10-CM | POA: Diagnosis not present

## 2019-05-19 DIAGNOSIS — J9600 Acute respiratory failure, unspecified whether with hypoxia or hypercapnia: Secondary | ICD-10-CM | POA: Diagnosis not present

## 2019-05-19 DIAGNOSIS — J698 Pneumonitis due to inhalation of other solids and liquids: Secondary | ICD-10-CM | POA: Diagnosis not present

## 2019-05-19 NOTE — Addendum Note (Signed)
Addended by: [name] on: 05/19/2019 05:51 PM   Modules accepted: Orders

## 2019-05-19 NOTE — Patient Outreach (Signed)
Telephone outreach to daughter, [name], who call [facility] office requested a nurse to return her call. No answer today on the number that was left by [name]. Also called home #listed in Epic without success. Left a message she can call me back.  Eulah Pont. Myrtie Neither, MSN, [facility] Gerontological Nurse Practitioner [facility] Care Management [phone]

## 2019-05-19 NOTE — Patient Outreach (Signed)
Daughter [name] returned my call.  She reports her father needs pharmacy assistance again this year. [name] helped him last year.  She would like to ask assistance again with obtaining Amitiza and Eliquis.  [name] of other [facility] services. She is familiar and has our information. Encouaged her to keep my number for any future needs.  Will send an order to our pharmacy staff. [name] has outreached 2 times this week without success.  [name]. James Neither, MSN, [facility] Gerontological Nurse Practitioner [facility] Care Management [phone]

## 2019-05-22 ENCOUNTER — Other Ambulatory Visit: Payer: Self-pay | Admitting: Pharmacist

## 2019-05-22 NOTE — Patient Outreach (Signed)
[name]  05/22/2019  [name] 1941/02/17 [id number]    Patient's daughter [name]) was called regarding medication assistance with Amitiza and Eliquis.   Patient is an 79 year old male with multiple medical medical conditions including but not limited to:  BPH, carotid stenosis, anxiety, CVA, chronic a fib, hypertension, hyperlipidemia, GERD, hypertension, and morbid obesity.  Patient was [id number] pitalized in January for CAP and was discharged to a SNF.  His daughter, [name] is his caregiver and stated Amitza had a $90 copay and she was concerned that he may go into the donut hole with the Eliquis as well.  After initial financial review, patient was found to be over income for LIS/Extra Help.   He MAY qualify for [employer] program for Amitza but has not spent the required medication expense amount for [name] program for Eliquis.    Patient will be sent an application for [name]. (Via Epic/CHL)   [name], CPhT will follow up with the patient.    Plan: Send note to James Canary, NP. [name] will follow the patient.    Medications Reviewed Today    Reviewed by [name], [facility] (Pharmacist) on 05/22/19 at [place] List Status: <None>  Medication Order Taking? Sig Documenting Provider Last Dose Status Informant  amLODipine (NORVASC) 5 MG tablet [id number] Yes Take 5 mg by mouth daily. [provider] Taking Active Child  ANORO ELLIPTA 62.5-25 MCG/INH AEPB [id number] Yes 1 puff daily. [provider] Taking Active   apixaban (ELIQUIS) 2.5 MG TABS tablet [id number] Yes Take 1 tablet (2.5 mg total) by mouth 2 (two) times daily. Leanor Kail, PA Taking Active Child        Discontinued 05/22/19 1252 (Change in therapy)   [facility] MELATONIN 5 MG TABS [id number] Yes Take 5 mg by mouth at bedtime. [provider] Taking Active   diazepam (VALIUM) 2 MG tablet [id number] No Take 1 tablet  (2 mg total) by mouth every 12 (twelve) hours as needed for anxiety.  Patient not taking: Reported on 05/22/2019   Domenic Polite, MD Not Taking Active   finasteride (PROSCAR) 5 MG tablet [id number] Yes Take 5 mg by mouth 2 (two) times daily.  [provider] Taking Active Child  furosemide (LASIX) 20 MG tablet [id number] Yes Take 20 mg by mouth 2 (two) times daily. [provider] Taking Active Child           Med Note Tamsen Snider   Wed Oct 30, 2015  9:13 AM)    HYDROcodone-acetaminophen (NORCO/VICODIN) 5-325 MG tablet [id number] Yes Take 1 tablet by mouth 2 (two) times daily as needed for moderate pain. Domenic Polite, MD Taking Active   isosorbide mononitrate (IMDUR) 60 MG 24 hr tablet [id number] Yes Take 1 tablet (60 mg total) by mouth daily. Leanor Kail, PA Taking Active Child  KLOR-CON M10 10 MEQ tablet [id number] Yes Take 20 mEq by mouth daily. [provider] Taking Active   lubiprostone (AMITIZA) 8 MCG capsule [id number] Yes Take 8 mcg by mouth 2 (two) times daily with a meal. [provider] Taking Active Child  metoprolol tartrate (LOPRESSOR) 25 MG tablet [id number] Yes Take 0.5 tablets (12.5 mg total) by mouth 2 (two) times daily. Domenic Polite, MD Taking Active   mirtazapine (REMERON) 7.5 MG tablet [id number] Yes Take 7.5 mg by mouth at bedtime. [provider] Taking Active   MITIGARE 0.6 MG CAPS [id number] No Take 2 capsules by mouth every 8 (  eight) hours as needed. [provider] Not Taking Active   nitroGLYCERIN (NITROSTAT) 0.4 MG SL tablet [id number] Yes Place 1 tablet (0.4 mg total) under the tongue every 5 (five) minutes as needed for chest pain. Leanor Kail, PA Taking Active Child  Omeprazole 20 MG TBEC [id number] Yes Take 2 tablets by mouth daily. [provider] Taking Active   rosuvastatin (CRESTOR) 20 MG tablet [id number] Yes Take 20 mg by mouth at bedtime. [provider] Taking Active    sertraline (ZOLOFT) 100 MG tablet [id number]  Take 100 mg by mouth 2 (two) times daily.  [provider]  Active Child           Med Note Tamala Julian, JEFFREY W   Thu Mar 21, 2015  2:44 AM)    tamsulosin (FLOMAX) 0.4 MG CAPS capsule [id number] Yes Take 0.4 mg by mouth daily. [provider] Taking Active          Plan: Patient's case is being closed as the [place] Team has transitioned to the Quality Department and will no longer document in CHL.   Jill [name], CPhT will follow the patient for medication assistance.

## 2019-05-23 ENCOUNTER — Other Ambulatory Visit: Payer: Self-pay | Admitting: Pharmacy Technician

## 2019-05-23 NOTE — Patient Outreach (Signed)
[place] [facility]) Care Management  05/23/2019  [name] 1940/10/20 [id number]                                       Medication Assistance Referral  Referral From: [place]  Medication/Company: [name] Patient application portion:  Mailed Provider application portion: Faxed  to Dr. Marton Redwood Provider address/fax verified via: Office website     Follow up:  Will follow up with patient in 10-14 business days to confirm application(s) have been received.  [name], [place]  [phone]

## 2019-05-25 DIAGNOSIS — M6282 Rhabdomyolysis: Secondary | ICD-10-CM | POA: Diagnosis not present

## 2019-05-25 DIAGNOSIS — I69391 Dysphagia following cerebral infarction: Secondary | ICD-10-CM | POA: Diagnosis not present

## 2019-05-25 DIAGNOSIS — J698 Pneumonitis due to inhalation of other solids and liquids: Secondary | ICD-10-CM | POA: Diagnosis not present

## 2019-05-25 DIAGNOSIS — J449 Chronic obstructive pulmonary disease, unspecified: Secondary | ICD-10-CM | POA: Diagnosis not present

## 2019-05-25 DIAGNOSIS — J9600 Acute respiratory failure, unspecified whether with hypoxia or hypercapnia: Secondary | ICD-10-CM | POA: Diagnosis not present

## 2019-05-25 DIAGNOSIS — R1310 Dysphagia, unspecified: Secondary | ICD-10-CM | POA: Diagnosis not present

## 2019-05-28 DIAGNOSIS — J9600 Acute respiratory failure, unspecified whether with hypoxia or hypercapnia: Secondary | ICD-10-CM | POA: Diagnosis not present

## 2019-05-28 DIAGNOSIS — G629 Polyneuropathy, unspecified: Secondary | ICD-10-CM | POA: Diagnosis not present

## 2019-05-28 DIAGNOSIS — K219 Gastro-esophageal reflux disease without esophagitis: Secondary | ICD-10-CM | POA: Diagnosis not present

## 2019-05-28 DIAGNOSIS — F419 Anxiety disorder, unspecified: Secondary | ICD-10-CM | POA: Diagnosis not present

## 2019-05-28 DIAGNOSIS — I739 Peripheral vascular disease, unspecified: Secondary | ICD-10-CM | POA: Diagnosis not present

## 2019-05-28 DIAGNOSIS — I130 Hypertensive heart and chronic kidney disease with heart failure and stage 1 through stage 4 chronic kidney disease, or unspecified chronic kidney disease: Secondary | ICD-10-CM | POA: Diagnosis not present

## 2019-05-28 DIAGNOSIS — R1310 Dysphagia, unspecified: Secondary | ICD-10-CM | POA: Diagnosis not present

## 2019-05-28 DIAGNOSIS — N183 Chronic kidney disease, stage 3 unspecified: Secondary | ICD-10-CM | POA: Diagnosis not present

## 2019-05-28 DIAGNOSIS — E6609 Other obesity due to excess calories: Secondary | ICD-10-CM | POA: Diagnosis not present

## 2019-05-28 DIAGNOSIS — M6282 Rhabdomyolysis: Secondary | ICD-10-CM | POA: Diagnosis not present

## 2019-05-28 DIAGNOSIS — I480 Paroxysmal atrial fibrillation: Secondary | ICD-10-CM | POA: Diagnosis not present

## 2019-05-28 DIAGNOSIS — E440 Moderate protein-calorie malnutrition: Secondary | ICD-10-CM | POA: Diagnosis not present

## 2019-05-28 DIAGNOSIS — I69391 Dysphagia following cerebral infarction: Secondary | ICD-10-CM | POA: Diagnosis not present

## 2019-05-28 DIAGNOSIS — F17200 Nicotine dependence, unspecified, uncomplicated: Secondary | ICD-10-CM | POA: Diagnosis not present

## 2019-05-28 DIAGNOSIS — I509 Heart failure, unspecified: Secondary | ICD-10-CM | POA: Diagnosis not present

## 2019-05-28 DIAGNOSIS — G894 Chronic pain syndrome: Secondary | ICD-10-CM | POA: Diagnosis not present

## 2019-05-28 DIAGNOSIS — G3184 Mild cognitive impairment, so stated: Secondary | ICD-10-CM | POA: Diagnosis not present

## 2019-05-28 DIAGNOSIS — N400 Enlarged prostate without lower urinary tract symptoms: Secondary | ICD-10-CM | POA: Diagnosis not present

## 2019-05-28 DIAGNOSIS — I252 Old myocardial infarction: Secondary | ICD-10-CM | POA: Diagnosis not present

## 2019-05-28 DIAGNOSIS — F329 Major depressive disorder, single episode, unspecified: Secondary | ICD-10-CM | POA: Diagnosis not present

## 2019-05-28 DIAGNOSIS — I2510 Atherosclerotic heart disease of native coronary artery without angina pectoris: Secondary | ICD-10-CM | POA: Diagnosis not present

## 2019-05-28 DIAGNOSIS — J698 Pneumonitis due to inhalation of other solids and liquids: Secondary | ICD-10-CM | POA: Diagnosis not present

## 2019-05-28 DIAGNOSIS — J449 Chronic obstructive pulmonary disease, unspecified: Secondary | ICD-10-CM | POA: Diagnosis not present

## 2019-05-28 DIAGNOSIS — I6529 Occlusion and stenosis of unspecified carotid artery: Secondary | ICD-10-CM | POA: Diagnosis not present

## 2019-05-28 DIAGNOSIS — Z6830 Body mass index (BMI) 30.0-30.9, adult: Secondary | ICD-10-CM | POA: Diagnosis not present

## 2019-05-29 DIAGNOSIS — I480 Paroxysmal atrial fibrillation: Secondary | ICD-10-CM | POA: Diagnosis not present

## 2019-05-29 DIAGNOSIS — I130 Hypertensive heart and chronic kidney disease with heart failure and stage 1 through stage 4 chronic kidney disease, or unspecified chronic kidney disease: Secondary | ICD-10-CM | POA: Diagnosis not present

## 2019-05-29 DIAGNOSIS — I509 Heart failure, unspecified: Secondary | ICD-10-CM | POA: Diagnosis not present

## 2019-05-29 DIAGNOSIS — J449 Chronic obstructive pulmonary disease, unspecified: Secondary | ICD-10-CM | POA: Diagnosis not present

## 2019-05-29 DIAGNOSIS — M6281 Muscle weakness (generalized): Secondary | ICD-10-CM | POA: Diagnosis not present

## 2019-05-29 DIAGNOSIS — R0609 Other forms of dyspnea: Secondary | ICD-10-CM | POA: Diagnosis not present

## 2019-05-29 DIAGNOSIS — N401 Enlarged prostate with lower urinary tract symptoms: Secondary | ICD-10-CM | POA: Diagnosis not present

## 2019-05-29 DIAGNOSIS — N1832 Chronic kidney disease, stage 3b: Secondary | ICD-10-CM | POA: Diagnosis not present

## 2019-05-29 DIAGNOSIS — R269 Unspecified abnormalities of gait and mobility: Secondary | ICD-10-CM | POA: Diagnosis not present

## 2019-05-30 ENCOUNTER — Ambulatory Visit: Payer: Self-pay | Admitting: Pharmacist

## 2019-06-06 ENCOUNTER — Other Ambulatory Visit (HOSPITAL_COMMUNITY): Payer: Self-pay | Admitting: Respiratory Therapy

## 2019-06-06 DIAGNOSIS — R0600 Dyspnea, unspecified: Secondary | ICD-10-CM

## 2019-06-06 DIAGNOSIS — R0609 Other forms of dyspnea: Secondary | ICD-10-CM

## 2019-06-07 ENCOUNTER — Encounter: Payer: Self-pay | Admitting: Cardiovascular Disease

## 2019-06-07 ENCOUNTER — Other Ambulatory Visit: Payer: Self-pay

## 2019-06-07 ENCOUNTER — Ambulatory Visit (INDEPENDENT_AMBULATORY_CARE_PROVIDER_SITE_OTHER): Payer: Medicare Other | Admitting: Cardiovascular Disease

## 2019-06-07 VITALS — BP 110/62 | HR 84 | Ht 69.0 in | Wt 234.8 lb

## 2019-06-07 DIAGNOSIS — R0602 Shortness of breath: Secondary | ICD-10-CM

## 2019-06-07 DIAGNOSIS — I4821 Permanent atrial fibrillation: Secondary | ICD-10-CM

## 2019-06-07 DIAGNOSIS — I25119 Atherosclerotic heart disease of native coronary artery with unspecified angina pectoris: Secondary | ICD-10-CM

## 2019-06-07 DIAGNOSIS — E782 Mixed hyperlipidemia: Secondary | ICD-10-CM

## 2019-06-07 DIAGNOSIS — I10 Essential (primary) hypertension: Secondary | ICD-10-CM

## 2019-06-07 NOTE — Patient Instructions (Signed)
Medication Instructions:  Your provider recommends that you continue on your current medications as directed. Please refer to the Current Medication list given to you today.   *If you need a refill on your cardiac medications before your next appointment, please call your pharmacy*  Testing/Procedures: Your provider has requested that you have an echocardiogram. Echocardiography is a painless test that uses sound waves to create images of your heart. It provides your doctor with information about the size and shape of your heart and how well your heart's chambers and valves are working. This procedure takes approximately one hour. There are no restrictions for this procedure.  Follow-Up: At [facility], you and your health needs are our priority.  As part of our continuing mission to provide you with exceptional heart care, we have created designated Provider Care Teams.  These Care Teams include your primary Cardiologist (physician) and Advanced Practice Providers (APPs -  Physician Assistants and Nurse Practitioners) who all work together to provide you with the care you need, when you need it. Your next appointment:   6 month(s) The format for your next appointment:   In Person Provider:   Scott Weaver, PA-C  

## 2019-06-07 NOTE — Progress Notes (Signed)
Cardiology Office Note:    Date:  06/07/2019   ID:  [name], DOB May 02, 1940, MRN [id number]  PCP:  James Redwood, MD  Cardiologist:  James Mocha, MD  Electrophysiologist:  None   Referring MD: James Redwood, MD   Chief Complaint  Patient presents with  . Shortness of Breath    History of Present Illness:    [name] is a 79 y.o. male with a hx of CAD, permanent atrial fibrillation on long term anticoagulation, stroke with residual disability and poor functional status, and PAD. He presents today for follow-up evaluation.   The patient is here with his son.  He remains very and active.  He complains of increasing shortness of breath now with low-level activity.  Notes that he has some pulmonary testing in the near future.  His furosemide has been increased to 40 mg twice daily, but he is not appreciated any significant change in his breathing with this.  Leg swelling has been mild.  No clear orthopnea or PND.  No recent chest pain.  The patient quit smoking about 15 months ago.  Past Medical History:  Diagnosis Date  . Anxiety   . Atrial fibrillation ([place])   . Bradycardia   . Carotid artery occlusion   . Cataract    bilataeral cateracts removed  . CKD (chronic kidney disease), stage III   . Colon polyps   . Common bile duct stone   . COPD (chronic obstructive pulmonary disease) ([place])   . 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 CVA (cerebral vascular accident) ([place])   . Depression   . DVT (deep venous thrombosis) ([place])   . Dyslipidemia   . Essential hypertension   . Foot pain   . GERD (gastroesophageal reflux disease)   . History of benign prostatic hypertrophy   . History of leukocytosis   . Hypertension   . MI, old   . Neuromuscular disorder (HCC)    neuropathy  . Neuropathy   . Peripheral vascular disease ([place])   . Renal failure, chronic    stage 111  . Tobacco abuse     Past Surgical History:    Procedure Laterality Date  . ANKLE SURGERY    . BYPASS GRAFT  2009 or  2010   stent  . CHOLECYSTECTOMY     Gall Bladder  . COLONOSCOPY    . intervention of the RCA     using a single BMS    Current Medications: Current Meds  Medication Sig  . amLODipine (NORVASC) 5 MG tablet Take 5 mg by mouth daily.  Marland Kitchen apixaban (ELIQUIS) 2.5 MG TABS tablet Take 1 tablet (2.5 mg total) by mouth 2 (two) times daily.  . CVS MELATONIN 5 MG TABS Take 5 mg by mouth at bedtime.  . diazepam (VALIUM) 2 MG tablet Take 1 tablet (2 mg total) by mouth every 12 (twelve) hours as needed for anxiety.  . finasteride (PROSCAR) 5 MG tablet Take 5 mg by mouth 2 (two) times daily.   . furosemide (LASIX) 20 MG tablet Take 40 mg by mouth 2 (two) times daily.   Marland Kitchen HYDROcodone-acetaminophen (NORCO/VICODIN) 5-325 MG tablet Take 1 tablet by mouth 2 (two) times daily as needed for moderate pain.  . isosorbide mononitrate (IMDUR) 60 MG 24 hr tablet Take 1 tablet (60 mg total) by mouth daily.  Marland Kitchen KLOR-CON M10 10 MEQ tablet Take 20 mEq by mouth daily.  Marland Kitchen lubiprostone (  AMITIZA) 8 MCG capsule Take 8 mcg by mouth 2 (two) times daily with a meal.  . metoprolol tartrate (LOPRESSOR) 25 MG tablet Take 25 mg by mouth 2 (two) times daily.  . mirtazapine (REMERON) 15 MG tablet Take 7.5 mg by mouth daily.  . nitroGLYCERIN (NITROSTAT) 0.4 MG SL tablet Place 1 tablet (0.4 mg total) under the tongue every 5 (five) minutes as needed for chest pain.  Marland Kitchen Omeprazole 20 MG TBEC Take 2 tablets by mouth daily.  . rosuvastatin (CRESTOR) 20 MG tablet Take 20 mg by mouth at bedtime.  . sertraline (ZOLOFT) 100 MG tablet Take 100 mg by mouth 2 (two) times daily.   . tamsulosin (FLOMAX) 0.4 MG CAPS capsule Take 0.4 mg by mouth daily.     Allergies:   Hctz [hydrochlorothiazide], Amitriptyline, Gabapentin, Metoprolol tartrate, Paroxetine, and Spiriva handihaler [tiotropium bromide monohydrate]   Social History   Socioeconomic History  . Marital status:  Single    Spouse name: Not on file  . Number of children: Not on file  . Years of education: Not on file  . Highest education level: Not on file  Occupational History  . Not on file  Tobacco Use  . Smoking status: Current Every Day Smoker    Packs/day: 1.00    Types: Cigarettes  . Smokeless tobacco: Current User    Last attempt to quit: 09/13/2012  . Tobacco comment: currently using e-cigarettes and regular cigarettes.  Substance and Sexual Activity  . Alcohol use: No    Alcohol/week: 0.0 standard drinks  . Drug use: No  . Sexual activity: Not on file  Other Topics Concern  . Not on file  Social History Narrative  . Not on file   Social Determinants of Health   Financial Resource Strain:   . Difficulty of Paying Living Expenses:   Food Insecurity:   . Worried About Charity fundraiser in the Last Year:   . Arboriculturist in the Last Year:   Transportation Needs:   . Film/video editor (Medical):   Marland Kitchen Lack of Transportation (Non-Medical):   Physical Activity:   . Days of Exercise per Week:   . Minutes of Exercise per Session:   Stress:   . Feeling of Stress :   Social Connections:   . Frequency of Communication with Friends and Family:   . Frequency of Social Gatherings with Friends and Family:   . Attends Religious Services:   . Active Member of Clubs or Organizations:   . Attends Archivist Meetings:   Marland Kitchen Marital Status:      Family History: The patient's family history includes AAA (abdominal aortic aneurysm) in his brother; CVA in his father; Cancer in his brother; Heart attack in his brother, father, and mother; Heart disease in his brother and brother; Hypertension in his brother, father, and mother; Stroke in his mother. There is no history of Colon cancer, Esophageal cancer, Rectal cancer, or Stomach cancer.  ROS:   Please see the history of present illness.     All other systems reviewed and are negative.  EKGs/Labs/Other Studies Reviewed:      The following studies were reviewed today: Echo 03/22/2015: Study Conclusions   - Left ventricle: The cavity size was normal. Wall thickness was  normal. Systolic function was mildly reduced. The estimated  ejection fraction was in the range of 45% to 50%. Hypokinesis of  the basal-midinferolateral, inferior, and inferoseptal  myocardium.  - Aortic valve: There was  trivial regurgitation.   Myocardial Perfusion Scan 03/28/2015: Study Highlights   Nuclear stress EF: 50%.  There was no ST segment deviation noted during stress.  The left ventricular ejection fraction is mildly decreased (45-54%).  This is a low risk study.   Low risk stress nuclear study with small, severe, fixed apical defect and large, severe, partially reversible inferolateral defect; findings consistent with apical thinning and prior inferolateral infarct and mild peri-infarct ischemia; EF 50 with akinesis of the basal inferolateral wall.   EKG:  EKG is not ordered today.    Recent Labs: 02/25/2019: B Natriuretic Peptide 157.3 02/27/2019: Magnesium 2.1 03/02/2019: ALT 70; BUN 29; Creatinine, Ser 1.57; Hemoglobin 9.6; Platelets 260; Potassium 4.1; Sodium 142  Recent Lipid Panel    Component Value Date/Time   CHOL 83 (L) 12/28/2018 1439   TRIG 82 12/28/2018 1439   HDL 41 12/28/2018 1439   CHOLHDL 2.0 12/28/2018 1439   CHOLHDL 2.6 03/21/2015 0533   VLDL 10 03/21/2015 0533   LDLCALC 25 12/28/2018 1439    Physical Exam:    VS:  BP 110/62   Pulse 84   Ht 5\' 9"  (1.753 m)   Wt 234 lb 12.8 oz (106.5 kg)   SpO2 96%   BMI 34.67 kg/m     Wt Readings from Last 3 Encounters:  06/07/19 234 lb 12.8 oz (106.5 kg)  02/28/19 270 lb (122.5 kg)  12/28/18 225 lb (102.1 kg)     GEN: Elderly appearing male in no acute distress HEENT: Normal NECK: No JVD; No carotid bruits LYMPHATICS: No lymphadenopathy CARDIAC: irregularly irregular, no murmurs, rubs, gallops RESPIRATORY:  Clear to auscultation without  rales, wheezing or rhonchi  ABDOMEN: Soft, non-tender, non-distended MUSCULOSKELETAL:  1+ left edema, trace on the right; No deformity  SKIN: Warm and dry NEUROLOGIC:  Alert and oriented x 3 PSYCHIATRIC:  Normal affect   ASSESSMENT:    1. Coronary artery disease involving native coronary artery of native heart with angina pectoris ([place])   2. Essential hypertension   3. Permanent atrial fibrillation ([place])   4. Mixed hyperlipidemia   5. Shortness of breath      PLAN:    In order of problems listed above:  1. The patient appears stable with no angina.  He is not on antiplatelet therapy because of chronic oral anticoagulation.  We will check for apixaban samples today.  No changes are recommended.  He continues to be rate controlled on metoprolol. 2. Blood pressure is well controlled on amlodipine, isosorbide, and metoprolol. 3. Rate controlled.  I have recommended checking an echocardiogram in light of his worsening shortness of breath.  I suspect his breathing is multifactorial and may be related to a combination of longstanding smoking, weight gain, and cardiac disease. 4. Followed by Dr. Brigitte Pulse.  Treated with rosuvastatin 20 mg daily. 5. As above, likely multifactorial.  His cardiopulmonary physical exam is fairly unremarkable.  We will check an echocardiogram.   Medication Adjustments/Labs and Tests Ordered: Current medicines are reviewed at length with the patient today.  Concerns regarding medicines are outlined above.  Orders Placed This Encounter  Procedures  . ECHOCARDIOGRAM COMPLETE   No orders of the defined types were placed in this encounter.   Patient Instructions  Medication Instructions:  Your provider recommends that you continue on your current medications as directed. Please refer to the Current Medication list given to you today.   *If you need a refill on your cardiac medications before your next appointment, please call  your  pharmacy*   Testing/Procedures: Your provider has requested that you have an echocardiogram. Echocardiography is a painless test that uses sound waves to create images of your heart. It provides your doctor with information about the size and shape of your heart and how well your heart's chambers and valves are working. This procedure takes approximately one hour. There are no restrictions for this procedure.     Follow-Up: At [facility], you and your health needs are our priority.  As part of our continuing mission to provide you with exceptional heart care, we have created designated Provider Care Teams.  These Care Teams include your primary Cardiologist (physician) and Advanced Practice Providers (APPs -  Physician Assistants and Nurse Practitioners) who all work together to provide you with the care you need, when you need it. Your next appointment:   6 month(s) The format for your next appointment:   In Person Provider:   Richardson Dopp, PA-C     Signed, James Mocha, MD  06/07/2019 10:14 AM    [place]

## 2019-06-08 ENCOUNTER — Other Ambulatory Visit: Payer: Self-pay | Admitting: Pharmacy Technician

## 2019-06-08 NOTE — Patient Outreach (Signed)
[place] [facility]) Care Management  06/08/2019  PRINCE COUEY 12-28-1940 [id number]    Successful call placed to patient's daughter [name] regarding patient assistance application(s) for Amitiza with [name] , HIPAA identifiers verified.   Spoke to [employer] who was unsure if her father had received the application. She informed she had a visit planned for Monday 06/12/2019. She would check his mailbox. She informed he mostly receives his mail from his PO Box. Informed [name] to pay attention to the check list on the letter from [place] as that has additional information that is required in addition to the application. Informed [name] that some of the application is filled out based on the information on hand and that the areas marked with asterisks are the places that required attention. [name] verbalized understanding and informed she would call if the application was not in his mailbox when she visits him on Monday.   Follow up:  Will route note to [place] for case closure if document(s) have not been received in the next 20 business days.  Dartanian Knaggs P. Gurleen Larrivee, [place]  [phone]

## 2019-06-20 ENCOUNTER — Other Ambulatory Visit (HOSPITAL_COMMUNITY)
Admission: RE | Admit: 2019-06-20 | Discharge: 2019-06-20 | Disposition: A | Discharge status: Home / Self Care | Payer: Medicare Other | Source: Ambulatory Visit | Attending: Internal Medicine | Admitting: Internal Medicine

## 2019-06-20 DIAGNOSIS — Z01812 Encounter for preprocedural laboratory examination: Secondary | ICD-10-CM | POA: Diagnosis not present

## 2019-06-20 DIAGNOSIS — Z20822 Contact with and (suspected) exposure to covid-19: Secondary | ICD-10-CM | POA: Insufficient documentation

## 2019-06-20 LAB — SARS CORONAVIRUS 2 (TAT 6-24 HRS): SARS Coronavirus 2: NEGATIVE

## 2019-06-22 ENCOUNTER — Other Ambulatory Visit: Payer: Self-pay

## 2019-06-22 ENCOUNTER — Ambulatory Visit (HOSPITAL_COMMUNITY)
Admission: RE | Admit: 2019-06-22 | Discharge: 2019-06-22 | Disposition: A | Discharge status: Home / Self Care | Payer: Medicare Other | Source: Ambulatory Visit | Attending: Internal Medicine | Admitting: Internal Medicine

## 2019-06-22 DIAGNOSIS — R0609 Other forms of dyspnea: Secondary | ICD-10-CM

## 2019-06-22 DIAGNOSIS — R0600 Dyspnea, unspecified: Secondary | ICD-10-CM | POA: Diagnosis not present

## 2019-06-22 LAB — PULMONARY FUNCTION TEST
DL/VA % pred: 47 %
DL/VA: 1.83 ml/min/mmHg/L
DLCO unc % pred: 31 %
DLCO unc: 7.73 ml/min/mmHg
FEF 25-75 Pre: 0.94 L/sec
FEF2575-%Pred-Pre: 47 %
FEV1-%Pred-Pre: 68 %
FEV1-Pre: 1.96 L
FEV1FVC-%Pred-Pre: 91 %
FEV6-%Pred-Pre: 76 %
FEV6-Pre: 2.86 L
FEV6FVC-%Pred-Pre: 102 %
FVC-%Pred-Pre: 74 %
FVC-Pre: 2.98 L
Pre FEV1/FVC ratio: 66 %
Pre FEV6/FVC Ratio: 96 %

## 2019-06-23 ENCOUNTER — Telehealth: Payer: Self-pay | Admitting: Cardiovascular Disease

## 2019-06-23 NOTE — Telephone Encounter (Signed)
Follow Up  Nurse with [facility] is calling back in wanting to speak with Nurse. Please give nurse a call back to assist.

## 2019-06-23 NOTE — Telephone Encounter (Signed)
Nurse with [facility] is seeing the pt and wanted to report to [place] that she has advised him that he should go to [facility] ER for multiple cardiac complaints. Otila Kluver RN with [facility] is calling in to let Dr. Burt Knack and RN know that she is with the pt and noted he is very sob, has DOE, bilateral lower extremity edema with 4+ pitting edema, and his weight has gone up from 234 lbs at his last OV with [place] on 4/14, and today it is 254.4 lbs.  Otila Kluver is recommending that the pt go to the ER for further evaluation and treatment, but wanted to get our input on this to see if we agree. Informed Otila Kluver, that given he's symptomatic, his weight is up 20 lbs, and he has trouble breathing, the pt does need to go to the ER via EMS now.  Informed Otila Kluver RN that I agree with her plan to send the pt. Informed Otila Kluver RN that I will send this message to Dr. Burt Knack and his RN as an Juluis Rainier, to make them aware of this plan. Otila Kluver verbalized understanding and agrees with this plan.  Otila Kluver states she will assist the pt now, with getting him transported to the ED.

## 2019-06-23 NOTE — Telephone Encounter (Signed)
New Message  Pt c/o swelling: STAT is pt has developed SOB within 24 hours  1) How much weight have you gained and in what time span? 10 lbs since 06/07/19  2) If swelling, where is the swelling located? Lower legs  3) Are you currently taking a fluid pill? Yes  4) Are you currently SOB? Yes  5) Do you have a log of your daily weights (if so, list)? No  6) Have you gained 3 pounds in a day or 5 pounds in a week? Yes  7) Have you traveled recently? No

## 2019-06-23 NOTE — Telephone Encounter (Signed)
I returned call to Otila Kluver who reports patient refused to go to the ED.  Otila Kluver is asking if OK with cardiology for home health to see patient over the weekend to follow up.  I told her this would be OK and to please reiterate to patient it is recommended he go to ED

## 2019-06-26 ENCOUNTER — Other Ambulatory Visit: Payer: Self-pay

## 2019-06-26 ENCOUNTER — Ambulatory Visit (HOSPITAL_COMMUNITY): Payer: Medicare Other | Attending: Cardiology

## 2019-06-26 DIAGNOSIS — R0602 Shortness of breath: Secondary | ICD-10-CM | POA: Insufficient documentation

## 2019-06-26 NOTE — Telephone Encounter (Signed)
Pt should increase furosemide to 80 mg BID x 3 days, then return to 40 mg BID. Continue same potassium supplementation. BMET/BNP 1 week. thanks

## 2019-06-26 NOTE — Telephone Encounter (Signed)
Spoke with the patient and his nurse, Otila Kluver. They both understand to increase Lasix to 80 mg BID for three days then resume Lasix 40 mg BID. Gave verbal orders for BMET and BNP to be drawn at home. Otila Kluver will fax results to [phone]. They were grateful for call and agree with treatment plan.

## 2019-06-27 DIAGNOSIS — M797 Fibromyalgia: Secondary | ICD-10-CM | POA: Diagnosis not present

## 2019-06-27 DIAGNOSIS — G629 Polyneuropathy, unspecified: Secondary | ICD-10-CM | POA: Diagnosis not present

## 2019-06-27 DIAGNOSIS — I6529 Occlusion and stenosis of unspecified carotid artery: Secondary | ICD-10-CM | POA: Diagnosis not present

## 2019-06-27 DIAGNOSIS — I130 Hypertensive heart and chronic kidney disease with heart failure and stage 1 through stage 4 chronic kidney disease, or unspecified chronic kidney disease: Secondary | ICD-10-CM | POA: Diagnosis not present

## 2019-06-27 DIAGNOSIS — I2510 Atherosclerotic heart disease of native coronary artery without angina pectoris: Secondary | ICD-10-CM | POA: Diagnosis not present

## 2019-06-27 DIAGNOSIS — F419 Anxiety disorder, unspecified: Secondary | ICD-10-CM | POA: Diagnosis not present

## 2019-06-27 DIAGNOSIS — Z6830 Body mass index (BMI) 30.0-30.9, adult: Secondary | ICD-10-CM | POA: Diagnosis not present

## 2019-06-27 DIAGNOSIS — J9600 Acute respiratory failure, unspecified whether with hypoxia or hypercapnia: Secondary | ICD-10-CM | POA: Diagnosis not present

## 2019-06-27 DIAGNOSIS — F17200 Nicotine dependence, unspecified, uncomplicated: Secondary | ICD-10-CM | POA: Diagnosis not present

## 2019-06-27 DIAGNOSIS — J449 Chronic obstructive pulmonary disease, unspecified: Secondary | ICD-10-CM | POA: Diagnosis not present

## 2019-06-27 DIAGNOSIS — J698 Pneumonitis due to inhalation of other solids and liquids: Secondary | ICD-10-CM | POA: Diagnosis not present

## 2019-06-27 DIAGNOSIS — N400 Enlarged prostate without lower urinary tract symptoms: Secondary | ICD-10-CM | POA: Diagnosis not present

## 2019-06-27 DIAGNOSIS — I739 Peripheral vascular disease, unspecified: Secondary | ICD-10-CM | POA: Diagnosis not present

## 2019-06-27 DIAGNOSIS — I480 Paroxysmal atrial fibrillation: Secondary | ICD-10-CM | POA: Diagnosis not present

## 2019-06-27 DIAGNOSIS — N183 Chronic kidney disease, stage 3 unspecified: Secondary | ICD-10-CM | POA: Diagnosis not present

## 2019-06-27 DIAGNOSIS — I252 Old myocardial infarction: Secondary | ICD-10-CM | POA: Diagnosis not present

## 2019-06-27 DIAGNOSIS — I509 Heart failure, unspecified: Secondary | ICD-10-CM | POA: Diagnosis not present

## 2019-06-27 DIAGNOSIS — E6609 Other obesity due to excess calories: Secondary | ICD-10-CM | POA: Diagnosis not present

## 2019-06-27 DIAGNOSIS — G894 Chronic pain syndrome: Secondary | ICD-10-CM | POA: Diagnosis not present

## 2019-06-27 DIAGNOSIS — I69391 Dysphagia following cerebral infarction: Secondary | ICD-10-CM | POA: Diagnosis not present

## 2019-06-27 DIAGNOSIS — E440 Moderate protein-calorie malnutrition: Secondary | ICD-10-CM | POA: Diagnosis not present

## 2019-06-27 DIAGNOSIS — M6282 Rhabdomyolysis: Secondary | ICD-10-CM | POA: Diagnosis not present

## 2019-06-27 DIAGNOSIS — F329 Major depressive disorder, single episode, unspecified: Secondary | ICD-10-CM | POA: Diagnosis not present

## 2019-06-27 DIAGNOSIS — R1310 Dysphagia, unspecified: Secondary | ICD-10-CM | POA: Diagnosis not present

## 2019-06-27 DIAGNOSIS — G3184 Mild cognitive impairment, so stated: Secondary | ICD-10-CM | POA: Diagnosis not present

## 2019-06-27 DIAGNOSIS — K219 Gastro-esophageal reflux disease without esophagitis: Secondary | ICD-10-CM | POA: Diagnosis not present

## 2019-07-03 ENCOUNTER — Telehealth: Payer: Self-pay | Admitting: Cardiovascular Disease

## 2019-07-03 DIAGNOSIS — I5023 Acute on chronic systolic (congestive) heart failure: Secondary | ICD-10-CM | POA: Diagnosis not present

## 2019-07-03 NOTE — Telephone Encounter (Signed)
[name] is calling to get verbal orders to continue care at his skilled nursing home.

## 2019-07-03 NOTE — Telephone Encounter (Signed)
Left message for [name] that general [place] orders will need to come from PCP.

## 2019-07-06 ENCOUNTER — Telehealth: Payer: Self-pay | Admitting: Cardiovascular Disease

## 2019-07-06 NOTE — Telephone Encounter (Signed)
Spoke with Claiborne Billings at [facility] faxed the results of BMET and BNP there, not to Cardiology. She reports elevated BNP = 1853.   The patient reports he is feeling much better. His swelling and breathing are better. She faxed results over and they were reviewed with Dr. Burt Knack.   Per Dr. Burt Knack, instructed patient to continue Lasix 40 mg BID. He will continue to limit salt, elevate legs when sitting, and he will call if symptoms worsen.  He was grateful for assistance.

## 2019-07-06 NOTE — Telephone Encounter (Signed)
New message   Per Claiborne Billings need to go over abnormal labs on this patient.

## 2019-07-13 ENCOUNTER — Telehealth: Payer: Self-pay | Admitting: Cardiovascular Disease

## 2019-07-13 NOTE — Telephone Encounter (Signed)
Attempted to call patient - the phone rang continuously and no VM picked up.  Will try again tomorrow.

## 2019-07-13 NOTE — Telephone Encounter (Signed)
sPt c/o swelling: STAT is pt has developed SOB within 24 hours  1) How much weight have you gained and in what time span? 7 lbs in 3 days  2) If swelling, where is the swelling located? Left ankle swollen one cm  3) Are you currently taking a fluid pill? Yes 40mg  lasix twice a day  4) Are you currently SOB? no  5) Do you have a log of your daily weights (if so, list)? 5/17 245.8lbs, 5/13  244lbs, 5/10  242lbs, 5/6  241.8lbs, 5/5  245lbs  6) Have you gained 3 pounds in a day or 5 pounds in a week? 7 lbs in 3 days  7) Have you traveled recently? No  Whitney from [facility] states the patient has gained 7lbs since she last saw him on Monday. She states he has also eaten 3 bags of Lays potato chips in 3 days. She states his left ankle measures one cm larger, but he is not having any symptoms. She would like a nurse to call the patient back.

## 2019-07-14 NOTE — Telephone Encounter (Signed)
The patient reports he does have some swelling, but knows exactly why. He ate "lots of chips" recently and drank "lots of booze." He states the [place] talked with him in detail about salt intake. Reiterated to him to limit salty foods including canned foods and frozen meals. He understands to stop eating potato chips or his swelling will continue to worsen. Today, he says his swelling is noticeably improving. He does not report any other symptoms.  He understands to monitor weight and strictly limit salt. He will call Monday if weight and swelling do not continue to decrease. He was grateful for call.

## 2019-07-21 ENCOUNTER — Ambulatory Visit: Payer: Medicare Other | Admitting: Cardiovascular Disease

## 2019-07-26 ENCOUNTER — Telehealth: Payer: Self-pay | Admitting: Cardiovascular Disease

## 2019-07-26 NOTE — Telephone Encounter (Signed)
[name] complains of constant SOB for several days that is worse on exertion.  He has no other complaints and specifically denies swelling (this was an issue a couple weeks ago and he quit eating chips and his swelling subsided), CP, racing heart, irregular HR, dizziness, lightheadedness, weight gain.  [facility] RN) checked his BP today and it was 120/60. He does not remember HR.  He is not in acute distress, but is concerned his SOB is getting worse.  He understands he will be called with Dr. Antionette Char recommendations.

## 2019-07-26 NOTE — Telephone Encounter (Signed)
Pt c/o Shortness Of Breath: STAT if SOB developed within the last 24 hours or pt is noticeably SOB on the phone  1. Are you currently SOB (can you hear that pt is SOB on the phone)? Otila Kluver, from [facility] called.   2. How long have you been experiencing SOB? SOB since Monday 5/31  3. Are you SOB when sitting or when up moving around? All the time. Less when he is sitting.   4. Are you currently experiencing any other symptoms? NO

## 2019-07-27 DIAGNOSIS — Z79891 Long term (current) use of opiate analgesic: Secondary | ICD-10-CM | POA: Diagnosis not present

## 2019-07-27 DIAGNOSIS — I2510 Atherosclerotic heart disease of native coronary artery without angina pectoris: Secondary | ICD-10-CM | POA: Diagnosis not present

## 2019-07-27 DIAGNOSIS — G3184 Mild cognitive impairment, so stated: Secondary | ICD-10-CM | POA: Diagnosis not present

## 2019-07-27 DIAGNOSIS — I69391 Dysphagia following cerebral infarction: Secondary | ICD-10-CM | POA: Diagnosis not present

## 2019-07-27 DIAGNOSIS — F17210 Nicotine dependence, cigarettes, uncomplicated: Secondary | ICD-10-CM | POA: Diagnosis not present

## 2019-07-27 DIAGNOSIS — G894 Chronic pain syndrome: Secondary | ICD-10-CM | POA: Diagnosis not present

## 2019-07-27 DIAGNOSIS — F329 Major depressive disorder, single episode, unspecified: Secondary | ICD-10-CM | POA: Diagnosis not present

## 2019-07-27 DIAGNOSIS — I509 Heart failure, unspecified: Secondary | ICD-10-CM | POA: Diagnosis not present

## 2019-07-27 DIAGNOSIS — Z7901 Long term (current) use of anticoagulants: Secondary | ICD-10-CM | POA: Diagnosis not present

## 2019-07-27 DIAGNOSIS — E440 Moderate protein-calorie malnutrition: Secondary | ICD-10-CM | POA: Diagnosis not present

## 2019-07-27 DIAGNOSIS — G629 Polyneuropathy, unspecified: Secondary | ICD-10-CM | POA: Diagnosis not present

## 2019-07-27 DIAGNOSIS — K219 Gastro-esophageal reflux disease without esophagitis: Secondary | ICD-10-CM | POA: Diagnosis not present

## 2019-07-27 DIAGNOSIS — N183 Chronic kidney disease, stage 3 unspecified: Secondary | ICD-10-CM | POA: Diagnosis not present

## 2019-07-27 DIAGNOSIS — I6529 Occlusion and stenosis of unspecified carotid artery: Secondary | ICD-10-CM | POA: Diagnosis not present

## 2019-07-27 DIAGNOSIS — E6609 Other obesity due to excess calories: Secondary | ICD-10-CM | POA: Diagnosis not present

## 2019-07-27 DIAGNOSIS — Z8701 Personal history of pneumonia (recurrent): Secondary | ICD-10-CM | POA: Diagnosis not present

## 2019-07-27 DIAGNOSIS — I252 Old myocardial infarction: Secondary | ICD-10-CM | POA: Diagnosis not present

## 2019-07-27 DIAGNOSIS — N400 Enlarged prostate without lower urinary tract symptoms: Secondary | ICD-10-CM | POA: Diagnosis not present

## 2019-07-27 DIAGNOSIS — M797 Fibromyalgia: Secondary | ICD-10-CM | POA: Diagnosis not present

## 2019-07-27 DIAGNOSIS — I739 Peripheral vascular disease, unspecified: Secondary | ICD-10-CM | POA: Diagnosis not present

## 2019-07-27 DIAGNOSIS — Z955 Presence of coronary angioplasty implant and graft: Secondary | ICD-10-CM | POA: Diagnosis not present

## 2019-07-27 DIAGNOSIS — F419 Anxiety disorder, unspecified: Secondary | ICD-10-CM | POA: Diagnosis not present

## 2019-07-27 DIAGNOSIS — I480 Paroxysmal atrial fibrillation: Secondary | ICD-10-CM | POA: Diagnosis not present

## 2019-07-27 DIAGNOSIS — I130 Hypertensive heart and chronic kidney disease with heart failure and stage 1 through stage 4 chronic kidney disease, or unspecified chronic kidney disease: Secondary | ICD-10-CM | POA: Diagnosis not present

## 2019-07-27 DIAGNOSIS — J449 Chronic obstructive pulmonary disease, unspecified: Secondary | ICD-10-CM | POA: Diagnosis not present

## 2019-07-31 DIAGNOSIS — I252 Old myocardial infarction: Secondary | ICD-10-CM | POA: Diagnosis not present

## 2019-07-31 DIAGNOSIS — N183 Chronic kidney disease, stage 3 unspecified: Secondary | ICD-10-CM | POA: Diagnosis not present

## 2019-07-31 DIAGNOSIS — I509 Heart failure, unspecified: Secondary | ICD-10-CM | POA: Diagnosis not present

## 2019-07-31 DIAGNOSIS — I2510 Atherosclerotic heart disease of native coronary artery without angina pectoris: Secondary | ICD-10-CM | POA: Diagnosis not present

## 2019-07-31 DIAGNOSIS — I130 Hypertensive heart and chronic kidney disease with heart failure and stage 1 through stage 4 chronic kidney disease, or unspecified chronic kidney disease: Secondary | ICD-10-CM | POA: Diagnosis not present

## 2019-07-31 DIAGNOSIS — J449 Chronic obstructive pulmonary disease, unspecified: Secondary | ICD-10-CM | POA: Diagnosis not present

## 2019-08-02 NOTE — Telephone Encounter (Signed)
Follow up    Pt is returning call  Call transferred to [facility]

## 2019-08-02 NOTE — Telephone Encounter (Signed)
Without swelling or weight gain, and with stable vitals, I'm not sure I would recommend any changes at this point. Probably best for him to be seen either by PCP or here with next available visit with me or APP.

## 2019-08-02 NOTE — Telephone Encounter (Signed)
Left message to call back  

## 2019-08-02 NOTE — Telephone Encounter (Signed)
Confirmed with the patient he has no swelling and his symptoms have not worsened. He has an appointment with Dr. Brigitte Pulse. In 2 weeks. Scheduled him for 6 month visit with Richardson Dopp in October.  He understands to call prior to that visit if symptoms worsen. He was grateful for assistance.

## 2019-08-07 ENCOUNTER — Telehealth: Payer: Self-pay | Admitting: Cardiovascular Disease

## 2019-08-07 DIAGNOSIS — J449 Chronic obstructive pulmonary disease, unspecified: Secondary | ICD-10-CM | POA: Diagnosis not present

## 2019-08-07 DIAGNOSIS — I2510 Atherosclerotic heart disease of native coronary artery without angina pectoris: Secondary | ICD-10-CM | POA: Diagnosis not present

## 2019-08-07 DIAGNOSIS — N183 Chronic kidney disease, stage 3 unspecified: Secondary | ICD-10-CM | POA: Diagnosis not present

## 2019-08-07 DIAGNOSIS — I252 Old myocardial infarction: Secondary | ICD-10-CM | POA: Diagnosis not present

## 2019-08-07 DIAGNOSIS — I509 Heart failure, unspecified: Secondary | ICD-10-CM | POA: Diagnosis not present

## 2019-08-07 DIAGNOSIS — I130 Hypertensive heart and chronic kidney disease with heart failure and stage 1 through stage 4 chronic kidney disease, or unspecified chronic kidney disease: Secondary | ICD-10-CM | POA: Diagnosis not present

## 2019-08-07 NOTE — Telephone Encounter (Signed)
Would have him take lasix 80 mg qam and 40 mg qpm until he sees Dr Brigitte Pulse. I'm sure he has fluid on board with all of the potato chips.

## 2019-08-07 NOTE — Telephone Encounter (Signed)
Spoke with Whitney, LPN, HHN who states patient has gained 9 lb since 6/7. She reports ankle swelling and abdominal girth have decreased; lungs clear to auscultation. Sees him once weekly. He has an appointment on Wednesday with PCP, Dr. Brigitte Pulse. States he eats 3 large bags of potato chips every week; feels his weight gain is related to calorie intake.  I advised that I will call patient to reiterate that he should not be consuming such high amounts of sodium and to assess for s/s of chf. Whitney thanked me for the call.  Called patient who states he feels "short-winded." I asked about the chips and he states he doesn't have anymore. States a neighbor cooks for him but he hasn't eaten anything yet. I asked about fruits and vegetables in the house and he agrees that he has some of those things available. I asked about appointment Wed. With Dr. Brigitte Pulse and he confirms. I advised him to call back with questions or concerns and he thanked me for the call.

## 2019-08-07 NOTE — Telephone Encounter (Signed)
Called and advised patient to increase furosemide to 80 mg in the am and 40 mg in the pm today, tomorrow, and Wednesday morning until he sees Dr. Brigitte Pulse. I asked patient to verify that he has the pill bottle and he states it is sitting right in front of him on the bar. He verbalized understanding and thanked me for the call.

## 2019-08-07 NOTE — Telephone Encounter (Signed)
New Message   [name] is calling from [facility] to let the nurse know  Since June 7th the pt has gained 9.4 pounds and is experiencing SOB, no other symptoms at this time   Please call Patient

## 2019-08-09 DIAGNOSIS — M109 Gout, unspecified: Secondary | ICD-10-CM | POA: Diagnosis not present

## 2019-08-09 DIAGNOSIS — E7849 Other hyperlipidemia: Secondary | ICD-10-CM | POA: Diagnosis not present

## 2019-08-09 DIAGNOSIS — Z125 Encounter for screening for malignant neoplasm of prostate: Secondary | ICD-10-CM | POA: Diagnosis not present

## 2019-08-09 DIAGNOSIS — R7301 Impaired fasting glucose: Secondary | ICD-10-CM | POA: Diagnosis not present

## 2019-08-17 DIAGNOSIS — N183 Chronic kidney disease, stage 3 unspecified: Secondary | ICD-10-CM | POA: Diagnosis not present

## 2019-08-17 DIAGNOSIS — I130 Hypertensive heart and chronic kidney disease with heart failure and stage 1 through stage 4 chronic kidney disease, or unspecified chronic kidney disease: Secondary | ICD-10-CM | POA: Diagnosis not present

## 2019-08-17 DIAGNOSIS — I2510 Atherosclerotic heart disease of native coronary artery without angina pectoris: Secondary | ICD-10-CM | POA: Diagnosis not present

## 2019-08-17 DIAGNOSIS — I509 Heart failure, unspecified: Secondary | ICD-10-CM | POA: Diagnosis not present

## 2019-08-17 DIAGNOSIS — I252 Old myocardial infarction: Secondary | ICD-10-CM | POA: Diagnosis not present

## 2019-08-17 DIAGNOSIS — J449 Chronic obstructive pulmonary disease, unspecified: Secondary | ICD-10-CM | POA: Diagnosis not present

## 2019-08-24 DIAGNOSIS — I130 Hypertensive heart and chronic kidney disease with heart failure and stage 1 through stage 4 chronic kidney disease, or unspecified chronic kidney disease: Secondary | ICD-10-CM | POA: Diagnosis not present

## 2019-08-24 DIAGNOSIS — J449 Chronic obstructive pulmonary disease, unspecified: Secondary | ICD-10-CM | POA: Diagnosis not present

## 2019-08-24 DIAGNOSIS — I2510 Atherosclerotic heart disease of native coronary artery without angina pectoris: Secondary | ICD-10-CM | POA: Diagnosis not present

## 2019-08-24 DIAGNOSIS — N183 Chronic kidney disease, stage 3 unspecified: Secondary | ICD-10-CM | POA: Diagnosis not present

## 2019-08-24 DIAGNOSIS — I252 Old myocardial infarction: Secondary | ICD-10-CM | POA: Diagnosis not present

## 2019-08-24 DIAGNOSIS — I509 Heart failure, unspecified: Secondary | ICD-10-CM | POA: Diagnosis not present

## 2019-08-26 DIAGNOSIS — I739 Peripheral vascular disease, unspecified: Secondary | ICD-10-CM | POA: Diagnosis not present

## 2019-08-26 DIAGNOSIS — R1310 Dysphagia, unspecified: Secondary | ICD-10-CM | POA: Diagnosis not present

## 2019-08-26 DIAGNOSIS — G3184 Mild cognitive impairment, so stated: Secondary | ICD-10-CM | POA: Diagnosis not present

## 2019-08-26 DIAGNOSIS — N400 Enlarged prostate without lower urinary tract symptoms: Secondary | ICD-10-CM | POA: Diagnosis not present

## 2019-08-26 DIAGNOSIS — I2510 Atherosclerotic heart disease of native coronary artery without angina pectoris: Secondary | ICD-10-CM | POA: Diagnosis not present

## 2019-08-26 DIAGNOSIS — I252 Old myocardial infarction: Secondary | ICD-10-CM | POA: Diagnosis not present

## 2019-08-26 DIAGNOSIS — K219 Gastro-esophageal reflux disease without esophagitis: Secondary | ICD-10-CM | POA: Diagnosis not present

## 2019-08-26 DIAGNOSIS — I69391 Dysphagia following cerebral infarction: Secondary | ICD-10-CM | POA: Diagnosis not present

## 2019-08-26 DIAGNOSIS — M797 Fibromyalgia: Secondary | ICD-10-CM | POA: Diagnosis not present

## 2019-08-26 DIAGNOSIS — Z7901 Long term (current) use of anticoagulants: Secondary | ICD-10-CM | POA: Diagnosis not present

## 2019-08-26 DIAGNOSIS — E6609 Other obesity due to excess calories: Secondary | ICD-10-CM | POA: Diagnosis not present

## 2019-08-26 DIAGNOSIS — I6529 Occlusion and stenosis of unspecified carotid artery: Secondary | ICD-10-CM | POA: Diagnosis not present

## 2019-08-26 DIAGNOSIS — F17210 Nicotine dependence, cigarettes, uncomplicated: Secondary | ICD-10-CM | POA: Diagnosis not present

## 2019-08-26 DIAGNOSIS — F419 Anxiety disorder, unspecified: Secondary | ICD-10-CM | POA: Diagnosis not present

## 2019-08-26 DIAGNOSIS — Z8701 Personal history of pneumonia (recurrent): Secondary | ICD-10-CM | POA: Diagnosis not present

## 2019-08-26 DIAGNOSIS — J449 Chronic obstructive pulmonary disease, unspecified: Secondary | ICD-10-CM | POA: Diagnosis not present

## 2019-08-26 DIAGNOSIS — G629 Polyneuropathy, unspecified: Secondary | ICD-10-CM | POA: Diagnosis not present

## 2019-08-26 DIAGNOSIS — Z955 Presence of coronary angioplasty implant and graft: Secondary | ICD-10-CM | POA: Diagnosis not present

## 2019-08-26 DIAGNOSIS — I480 Paroxysmal atrial fibrillation: Secondary | ICD-10-CM | POA: Diagnosis not present

## 2019-08-26 DIAGNOSIS — G894 Chronic pain syndrome: Secondary | ICD-10-CM | POA: Diagnosis not present

## 2019-08-26 DIAGNOSIS — F329 Major depressive disorder, single episode, unspecified: Secondary | ICD-10-CM | POA: Diagnosis not present

## 2019-08-26 DIAGNOSIS — E440 Moderate protein-calorie malnutrition: Secondary | ICD-10-CM | POA: Diagnosis not present

## 2019-08-26 DIAGNOSIS — I130 Hypertensive heart and chronic kidney disease with heart failure and stage 1 through stage 4 chronic kidney disease, or unspecified chronic kidney disease: Secondary | ICD-10-CM | POA: Diagnosis not present

## 2019-08-26 DIAGNOSIS — I509 Heart failure, unspecified: Secondary | ICD-10-CM | POA: Diagnosis not present

## 2019-08-26 DIAGNOSIS — N183 Chronic kidney disease, stage 3 unspecified: Secondary | ICD-10-CM | POA: Diagnosis not present

## 2019-08-31 DIAGNOSIS — I252 Old myocardial infarction: Secondary | ICD-10-CM | POA: Diagnosis not present

## 2019-08-31 DIAGNOSIS — N183 Chronic kidney disease, stage 3 unspecified: Secondary | ICD-10-CM | POA: Diagnosis not present

## 2019-08-31 DIAGNOSIS — J449 Chronic obstructive pulmonary disease, unspecified: Secondary | ICD-10-CM | POA: Diagnosis not present

## 2019-08-31 DIAGNOSIS — I2510 Atherosclerotic heart disease of native coronary artery without angina pectoris: Secondary | ICD-10-CM | POA: Diagnosis not present

## 2019-08-31 DIAGNOSIS — I509 Heart failure, unspecified: Secondary | ICD-10-CM | POA: Diagnosis not present

## 2019-08-31 DIAGNOSIS — I130 Hypertensive heart and chronic kidney disease with heart failure and stage 1 through stage 4 chronic kidney disease, or unspecified chronic kidney disease: Secondary | ICD-10-CM | POA: Diagnosis not present

## 2019-09-04 DIAGNOSIS — I509 Heart failure, unspecified: Secondary | ICD-10-CM | POA: Diagnosis not present

## 2019-09-04 DIAGNOSIS — I130 Hypertensive heart and chronic kidney disease with heart failure and stage 1 through stage 4 chronic kidney disease, or unspecified chronic kidney disease: Secondary | ICD-10-CM | POA: Diagnosis not present

## 2019-09-04 DIAGNOSIS — I252 Old myocardial infarction: Secondary | ICD-10-CM | POA: Diagnosis not present

## 2019-09-04 DIAGNOSIS — N183 Chronic kidney disease, stage 3 unspecified: Secondary | ICD-10-CM | POA: Diagnosis not present

## 2019-09-04 DIAGNOSIS — I2510 Atherosclerotic heart disease of native coronary artery without angina pectoris: Secondary | ICD-10-CM | POA: Diagnosis not present

## 2019-09-04 DIAGNOSIS — J449 Chronic obstructive pulmonary disease, unspecified: Secondary | ICD-10-CM | POA: Diagnosis not present

## 2019-09-11 DIAGNOSIS — I130 Hypertensive heart and chronic kidney disease with heart failure and stage 1 through stage 4 chronic kidney disease, or unspecified chronic kidney disease: Secondary | ICD-10-CM | POA: Diagnosis not present

## 2019-09-11 DIAGNOSIS — I509 Heart failure, unspecified: Secondary | ICD-10-CM | POA: Diagnosis not present

## 2019-09-11 DIAGNOSIS — J449 Chronic obstructive pulmonary disease, unspecified: Secondary | ICD-10-CM | POA: Diagnosis not present

## 2019-09-11 DIAGNOSIS — I252 Old myocardial infarction: Secondary | ICD-10-CM | POA: Diagnosis not present

## 2019-09-11 DIAGNOSIS — I2510 Atherosclerotic heart disease of native coronary artery without angina pectoris: Secondary | ICD-10-CM | POA: Diagnosis not present

## 2019-09-11 DIAGNOSIS — N183 Chronic kidney disease, stage 3 unspecified: Secondary | ICD-10-CM | POA: Diagnosis not present

## 2019-09-18 ENCOUNTER — Telehealth: Payer: Self-pay | Admitting: Cardiovascular Disease

## 2019-09-18 DIAGNOSIS — N183 Chronic kidney disease, stage 3 unspecified: Secondary | ICD-10-CM | POA: Diagnosis not present

## 2019-09-18 DIAGNOSIS — I130 Hypertensive heart and chronic kidney disease with heart failure and stage 1 through stage 4 chronic kidney disease, or unspecified chronic kidney disease: Secondary | ICD-10-CM | POA: Diagnosis not present

## 2019-09-18 DIAGNOSIS — J449 Chronic obstructive pulmonary disease, unspecified: Secondary | ICD-10-CM | POA: Diagnosis not present

## 2019-09-18 DIAGNOSIS — I2510 Atherosclerotic heart disease of native coronary artery without angina pectoris: Secondary | ICD-10-CM | POA: Diagnosis not present

## 2019-09-18 DIAGNOSIS — I509 Heart failure, unspecified: Secondary | ICD-10-CM | POA: Diagnosis not present

## 2019-09-18 DIAGNOSIS — I252 Old myocardial infarction: Secondary | ICD-10-CM | POA: Diagnosis not present

## 2019-09-18 NOTE — Telephone Encounter (Signed)
I spoke with patient's home health nurse Otila Kluver).  She saw patient today and his weight is 272 lb. Previous weights- 7/8-262 lbs 7/12- 259 lbs 7-19- 265 lbs. Patient is always short of breath but it has gotten worse in the last week. He is eating chips and food high in salt.  Sometimes people will bring meals to him. Ankles are swollen and has slight swelling in calves. Abdomen swollen.  Decreased breath sounds in lower lobes but upper lobes are clear. I asked about need for emergency room visit and Otila Kluver reports patient has had worse episodes in past but she is concerned about increased weight and shortness of breath.  Home health can do labs and mobile chest X-ray if needed. He is taking lasix 40 mg twice daily. Has not seen PCP recently and does not have visit scheduled anytime soon.

## 2019-09-18 NOTE — Telephone Encounter (Signed)
Pt c/o swelling: STAT is pt has developed SOB within 24 hours  1) How much weight have you gained and in what time span? 7/12 259.4      Today 272.0  2) If swelling, where is the swelling located?  Ankles  mesuring 25cm each  3) Are you currently taking a fluid pill? yes  4) Are you currently SOB? Yes  5) Do you have a log of your daily weights (if so, list)? Doesn't write them down   Have you gained 3 pounds in a day or 5 pounds in a week? He did gain more than 5 lbs in a week, but another nurse saw him last week.     6) Have you traveled recently? No

## 2019-09-18 NOTE — Telephone Encounter (Signed)
Phone call became disconnected while I was speaking with [name].  Follow up call went to voicemail.  Left message I would try again later to reach her.

## 2019-09-18 NOTE — Telephone Encounter (Signed)
I spoke with Otila Kluver and gave her information from [facility] [place] [place], [place].  Home health will draw BMP this Friday

## 2019-09-18 NOTE — Telephone Encounter (Signed)
Looks like he is about 10 lbs up from 7/8. Let's have him increase his lasix to 80mg  BID and Kdur 40mg  daily x 2 days. He needs to eat less than 2000mg  sodium a day. Then he can go back to normal dosing of lasix and potassium. Will need repeat BMET on Friday.

## 2019-09-21 DIAGNOSIS — I130 Hypertensive heart and chronic kidney disease with heart failure and stage 1 through stage 4 chronic kidney disease, or unspecified chronic kidney disease: Secondary | ICD-10-CM | POA: Diagnosis not present

## 2019-09-21 DIAGNOSIS — I252 Old myocardial infarction: Secondary | ICD-10-CM | POA: Diagnosis not present

## 2019-09-21 DIAGNOSIS — I2510 Atherosclerotic heart disease of native coronary artery without angina pectoris: Secondary | ICD-10-CM | POA: Diagnosis not present

## 2019-09-21 DIAGNOSIS — J449 Chronic obstructive pulmonary disease, unspecified: Secondary | ICD-10-CM | POA: Diagnosis not present

## 2019-09-21 DIAGNOSIS — N183 Chronic kidney disease, stage 3 unspecified: Secondary | ICD-10-CM | POA: Diagnosis not present

## 2019-09-21 DIAGNOSIS — R0600 Dyspnea, unspecified: Secondary | ICD-10-CM | POA: Diagnosis not present

## 2019-09-21 DIAGNOSIS — E8770 Fluid overload, unspecified: Secondary | ICD-10-CM | POA: Diagnosis not present

## 2019-09-21 DIAGNOSIS — I509 Heart failure, unspecified: Secondary | ICD-10-CM | POA: Diagnosis not present

## 2019-09-25 DIAGNOSIS — Z8701 Personal history of pneumonia (recurrent): Secondary | ICD-10-CM | POA: Diagnosis not present

## 2019-09-25 DIAGNOSIS — G3184 Mild cognitive impairment, so stated: Secondary | ICD-10-CM | POA: Diagnosis not present

## 2019-09-25 DIAGNOSIS — N183 Chronic kidney disease, stage 3 unspecified: Secondary | ICD-10-CM | POA: Diagnosis not present

## 2019-09-25 DIAGNOSIS — M797 Fibromyalgia: Secondary | ICD-10-CM | POA: Diagnosis not present

## 2019-09-25 DIAGNOSIS — I69391 Dysphagia following cerebral infarction: Secondary | ICD-10-CM | POA: Diagnosis not present

## 2019-09-25 DIAGNOSIS — J449 Chronic obstructive pulmonary disease, unspecified: Secondary | ICD-10-CM | POA: Diagnosis not present

## 2019-09-25 DIAGNOSIS — Z7901 Long term (current) use of anticoagulants: Secondary | ICD-10-CM | POA: Diagnosis not present

## 2019-09-25 DIAGNOSIS — E6609 Other obesity due to excess calories: Secondary | ICD-10-CM | POA: Diagnosis not present

## 2019-09-25 DIAGNOSIS — I739 Peripheral vascular disease, unspecified: Secondary | ICD-10-CM | POA: Diagnosis not present

## 2019-09-25 DIAGNOSIS — F329 Major depressive disorder, single episode, unspecified: Secondary | ICD-10-CM | POA: Diagnosis not present

## 2019-09-25 DIAGNOSIS — G894 Chronic pain syndrome: Secondary | ICD-10-CM | POA: Diagnosis not present

## 2019-09-25 DIAGNOSIS — I130 Hypertensive heart and chronic kidney disease with heart failure and stage 1 through stage 4 chronic kidney disease, or unspecified chronic kidney disease: Secondary | ICD-10-CM | POA: Diagnosis not present

## 2019-09-25 DIAGNOSIS — Z955 Presence of coronary angioplasty implant and graft: Secondary | ICD-10-CM | POA: Diagnosis not present

## 2019-09-25 DIAGNOSIS — F17210 Nicotine dependence, cigarettes, uncomplicated: Secondary | ICD-10-CM | POA: Diagnosis not present

## 2019-09-25 DIAGNOSIS — I480 Paroxysmal atrial fibrillation: Secondary | ICD-10-CM | POA: Diagnosis not present

## 2019-09-25 DIAGNOSIS — N400 Enlarged prostate without lower urinary tract symptoms: Secondary | ICD-10-CM | POA: Diagnosis not present

## 2019-09-25 DIAGNOSIS — E440 Moderate protein-calorie malnutrition: Secondary | ICD-10-CM | POA: Diagnosis not present

## 2019-09-25 DIAGNOSIS — K219 Gastro-esophageal reflux disease without esophagitis: Secondary | ICD-10-CM | POA: Diagnosis not present

## 2019-09-25 DIAGNOSIS — I6529 Occlusion and stenosis of unspecified carotid artery: Secondary | ICD-10-CM | POA: Diagnosis not present

## 2019-09-25 DIAGNOSIS — F419 Anxiety disorder, unspecified: Secondary | ICD-10-CM | POA: Diagnosis not present

## 2019-09-25 DIAGNOSIS — I2510 Atherosclerotic heart disease of native coronary artery without angina pectoris: Secondary | ICD-10-CM | POA: Diagnosis not present

## 2019-09-25 DIAGNOSIS — I252 Old myocardial infarction: Secondary | ICD-10-CM | POA: Diagnosis not present

## 2019-09-25 DIAGNOSIS — G629 Polyneuropathy, unspecified: Secondary | ICD-10-CM | POA: Diagnosis not present

## 2019-09-25 DIAGNOSIS — R1310 Dysphagia, unspecified: Secondary | ICD-10-CM | POA: Diagnosis not present

## 2019-09-25 DIAGNOSIS — I509 Heart failure, unspecified: Secondary | ICD-10-CM | POA: Diagnosis not present

## 2019-09-28 DIAGNOSIS — I252 Old myocardial infarction: Secondary | ICD-10-CM | POA: Diagnosis not present

## 2019-09-28 DIAGNOSIS — I130 Hypertensive heart and chronic kidney disease with heart failure and stage 1 through stage 4 chronic kidney disease, or unspecified chronic kidney disease: Secondary | ICD-10-CM | POA: Diagnosis not present

## 2019-09-28 DIAGNOSIS — J449 Chronic obstructive pulmonary disease, unspecified: Secondary | ICD-10-CM | POA: Diagnosis not present

## 2019-09-28 DIAGNOSIS — I509 Heart failure, unspecified: Secondary | ICD-10-CM | POA: Diagnosis not present

## 2019-09-28 DIAGNOSIS — I2510 Atherosclerotic heart disease of native coronary artery without angina pectoris: Secondary | ICD-10-CM | POA: Diagnosis not present

## 2019-09-28 DIAGNOSIS — N183 Chronic kidney disease, stage 3 unspecified: Secondary | ICD-10-CM | POA: Diagnosis not present

## 2019-10-03 DIAGNOSIS — I130 Hypertensive heart and chronic kidney disease with heart failure and stage 1 through stage 4 chronic kidney disease, or unspecified chronic kidney disease: Secondary | ICD-10-CM | POA: Diagnosis not present

## 2019-10-03 DIAGNOSIS — I2510 Atherosclerotic heart disease of native coronary artery without angina pectoris: Secondary | ICD-10-CM | POA: Diagnosis not present

## 2019-10-03 DIAGNOSIS — J449 Chronic obstructive pulmonary disease, unspecified: Secondary | ICD-10-CM | POA: Diagnosis not present

## 2019-10-03 DIAGNOSIS — I252 Old myocardial infarction: Secondary | ICD-10-CM | POA: Diagnosis not present

## 2019-10-03 DIAGNOSIS — N183 Chronic kidney disease, stage 3 unspecified: Secondary | ICD-10-CM | POA: Diagnosis not present

## 2019-10-03 DIAGNOSIS — I509 Heart failure, unspecified: Secondary | ICD-10-CM | POA: Diagnosis not present

## 2019-10-10 DIAGNOSIS — I2510 Atherosclerotic heart disease of native coronary artery without angina pectoris: Secondary | ICD-10-CM | POA: Diagnosis not present

## 2019-10-10 DIAGNOSIS — I130 Hypertensive heart and chronic kidney disease with heart failure and stage 1 through stage 4 chronic kidney disease, or unspecified chronic kidney disease: Secondary | ICD-10-CM | POA: Diagnosis not present

## 2019-10-10 DIAGNOSIS — N183 Chronic kidney disease, stage 3 unspecified: Secondary | ICD-10-CM | POA: Diagnosis not present

## 2019-10-10 DIAGNOSIS — I252 Old myocardial infarction: Secondary | ICD-10-CM | POA: Diagnosis not present

## 2019-10-10 DIAGNOSIS — J449 Chronic obstructive pulmonary disease, unspecified: Secondary | ICD-10-CM | POA: Diagnosis not present

## 2019-10-10 DIAGNOSIS — I509 Heart failure, unspecified: Secondary | ICD-10-CM | POA: Diagnosis not present

## 2019-10-20 DIAGNOSIS — J449 Chronic obstructive pulmonary disease, unspecified: Secondary | ICD-10-CM | POA: Diagnosis not present

## 2019-10-20 DIAGNOSIS — I252 Old myocardial infarction: Secondary | ICD-10-CM | POA: Diagnosis not present

## 2019-10-20 DIAGNOSIS — N183 Chronic kidney disease, stage 3 unspecified: Secondary | ICD-10-CM | POA: Diagnosis not present

## 2019-10-20 DIAGNOSIS — I2510 Atherosclerotic heart disease of native coronary artery without angina pectoris: Secondary | ICD-10-CM | POA: Diagnosis not present

## 2019-10-20 DIAGNOSIS — I130 Hypertensive heart and chronic kidney disease with heart failure and stage 1 through stage 4 chronic kidney disease, or unspecified chronic kidney disease: Secondary | ICD-10-CM | POA: Diagnosis not present

## 2019-10-20 DIAGNOSIS — I509 Heart failure, unspecified: Secondary | ICD-10-CM | POA: Diagnosis not present

## 2019-11-28 DIAGNOSIS — R7301 Impaired fasting glucose: Secondary | ICD-10-CM | POA: Diagnosis not present

## 2019-11-28 DIAGNOSIS — I2510 Atherosclerotic heart disease of native coronary artery without angina pectoris: Secondary | ICD-10-CM | POA: Diagnosis not present

## 2019-11-28 DIAGNOSIS — N184 Chronic kidney disease, stage 4 (severe): Secondary | ICD-10-CM | POA: Diagnosis not present

## 2019-11-28 DIAGNOSIS — R0600 Dyspnea, unspecified: Secondary | ICD-10-CM | POA: Diagnosis not present

## 2019-11-28 DIAGNOSIS — I480 Paroxysmal atrial fibrillation: Secondary | ICD-10-CM | POA: Diagnosis not present

## 2019-11-28 DIAGNOSIS — Z23 Encounter for immunization: Secondary | ICD-10-CM | POA: Diagnosis not present

## 2019-11-28 DIAGNOSIS — E039 Hypothyroidism, unspecified: Secondary | ICD-10-CM | POA: Diagnosis not present

## 2019-11-28 DIAGNOSIS — I5022 Chronic systolic (congestive) heart failure: Secondary | ICD-10-CM | POA: Diagnosis not present

## 2019-11-28 DIAGNOSIS — J449 Chronic obstructive pulmonary disease, unspecified: Secondary | ICD-10-CM | POA: Diagnosis not present

## 2019-11-28 DIAGNOSIS — Z7901 Long term (current) use of anticoagulants: Secondary | ICD-10-CM | POA: Diagnosis not present

## 2019-11-28 DIAGNOSIS — I10 Essential (primary) hypertension: Secondary | ICD-10-CM | POA: Diagnosis not present

## 2019-11-28 DIAGNOSIS — E785 Hyperlipidemia, unspecified: Secondary | ICD-10-CM | POA: Diagnosis not present

## 2019-12-03 NOTE — Progress Notes (Addendum)
Cardiology Office Note:    Date:  12/04/2019   ID:  [name], DOB Aug 30, 1940, MRN [id number]  PCP:  James Redwood, MD  [facility] Cardiologist:  James Mocha, MD   [facility] Electrophysiologist:  None   Referring MD: James Redwood, MD   Chief Complaint:  Follow-up (CAD, atrial fibrillation)    Patient Profile:    [name] is a 79 y.o. male with:   Coronary artery disease   S/p Inf STEMI in 2010 tx with BMS to RCA  Cath in 2011: RCA stent patent, mod non-obs CAD   Myoview in 2017: low risk, inf infarct with peri-infarct ischemia   Ischemic CM  Echocardiogram 5/21: EF 45-50, inf HK  Permanent atrial fibrillation   Hx of stroke with residual facial droop   Peripheral arterial disease   Cath in 2011: R iliac occluded; L iliac 80   Carotid artery disease   Chronic kidney disease   COPD  Ex-smoker   Hypertension   Hyperlipidemia   Hx of DVT   Bradycardia   Prior CV studies: Echocardiogram 06/26/19 EF 45-50, inf HK, mild reduced RVSF, RVSP 35.5, mild BAE, trivial MR, mild AV sclerosis (no AS), mild dilation of Ao root (37 mm)  Carotid US 10/08/16 R 1-39; L 40-59  Myoview 03/28/15 EF 50, apical thinning, prior inf-lat infarct and mild peri-infarct ischemia, inf-lat AK, Low Risk   Echocardiogram 03/22/15 EF 45-50, inf HK  Carotid US 7/15 R 40%, L 40-59%  Echo 6/15 Mild focal basal septal hypertrophy, EF 50-55%, inferior AK Mild AI MAC, mild MR  LHC 2/11 LAD:  Mid 40, dist 30 LCx:  Dist 50 RCA: prox 50 at edge of stent R iliac occluded; L iliac 80 EF 50-55%   History of Present Illness:    [name] was last seen by Dr. Burt [name] in 4/21.  He had symptoms of shortness of breath that was felt to be multifactorial.  An echocardiogram showed stable findings with EF 45-50 and inf HK.  He returns for follow up.  He is here with his neighbor.  He continues to note worsening shortness of breath.  He is fairly sedentary.  He  arrives in a wheelchair.  He does not getting up to use his [place] during the day.  He sleeps in a recliner.  He has not had chest discomfort, syncope.  He notes fairly stable leg swelling.  PFTs in April did demonstrate moderate obstructive disease with reduced DLCO.  He notes that he uses 2 different types of inhalers.  They are not currently on his medication list.  He has not really had any improvement with this.  He also notes no improvement with escalating doses of furosemide.  He does admit to a diet high in salt.  His neighbors bring him food.  He usually eats fast food.  His weight is up 50 pounds since April.  However, I suspect that a weight of 234 in April was incorrect as his previous weight was 270.  In any event, his weight is up overall.       Past Medical History:  Diagnosis Date  . Anxiety   . Atrial fibrillation ([place])   . Bradycardia   . Carotid artery occlusion   . Cataract    bilataeral cateracts removed  . CKD (chronic kidney disease), stage III ([place])   . Colon polyps   . Common bile duct stone   . COPD (chronic obstructive pulmonary disease) ([place])   .  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 CVA (cerebral vascular accident) ([place])   . Depression   . DVT (deep venous thrombosis) ([place])   . Dyslipidemia   . Essential hypertension   . Foot pain   . GERD (gastroesophageal reflux disease)   . History of benign prostatic hypertrophy   . History of leukocytosis   . Hypertension   . MI, old   . Neuromuscular disorder (HCC)    neuropathy  . Neuropathy   . Peripheral vascular disease ([place])   . Renal failure, chronic    stage 111  . Tobacco abuse     Current Medications: Current Meds  Medication Sig  . ANORO ELLIPTA 62.5-25 MCG/INH AEPB 1 puff daily.  Marland Kitchen apixaban (ELIQUIS) 2.5 MG TABS tablet Take 1 tablet (2.5 mg total) by mouth 2 (two) times daily.  . diazepam (VALIUM) 2 MG tablet Take 1 tablet (2 mg total) by mouth every  12 (twelve) hours as needed for anxiety.  . finasteride (PROSCAR) 5 MG tablet Take 5 mg by mouth 2 (two) times daily.   Marland Kitchen HYDROcodone-acetaminophen (NORCO/VICODIN) 5-325 MG tablet Take 1 tablet by mouth 2 (two) times daily as needed for moderate pain.  . isosorbide mononitrate (IMDUR) 60 MG 24 hr tablet Take 1 tablet (60 mg total) by mouth daily.  Marland Kitchen KLOR-CON M10 10 MEQ tablet Take 20 mEq by mouth daily.  Marland Kitchen levothyroxine (SYNTHROID) 50 MCG tablet Take 50 mcg by mouth daily.  . metoprolol tartrate (LOPRESSOR) 25 MG tablet Take 25 mg by mouth 2 (two) times daily.  . mirtazapine (REMERON) 15 MG tablet Take 7.5 mg by mouth daily.  . nitroGLYCERIN (NITROSTAT) 0.4 MG SL tablet Place 1 tablet (0.4 mg total) under the tongue every 5 (five) minutes as needed for chest pain.  Marland Kitchen omeprazole (PRILOSEC) 40 MG capsule Take 40 mg by mouth daily.  . rosuvastatin (CRESTOR) 20 MG tablet Take 20 mg by mouth at bedtime.  . sertraline (ZOLOFT) 100 MG tablet Take 100 mg by mouth 2 (two) times daily.   . tamsulosin (FLOMAX) 0.4 MG CAPS capsule Take 0.4 mg by mouth daily.  . [DISCONTINUED] furosemide (LASIX) 20 MG tablet Take 40 mg by mouth 2 (two) times daily.      Allergies:   Hctz [hydrochlorothiazide], Amitriptyline, Gabapentin, Metoprolol tartrate, Paroxetine, and Spiriva handihaler [tiotropium bromide monohydrate]   Social History   Tobacco Use  . Smoking status: Current Every Day Smoker    Packs/day: 1.00    Types: Cigarettes  . Smokeless tobacco: Current User    Last attempt to quit: 09/13/2012  . Tobacco comment: currently using e-cigarettes and regular cigarettes.  Vaping Use  . Vaping Use: Never used  Substance Use Topics  . Alcohol use: No    Alcohol/week: 0.0 standard drinks  . Drug use: No     Family Hx: The patient's family history includes AAA (abdominal aortic aneurysm) in his brother; CVA in his father; Cancer in his brother; Heart attack in his brother, father, and mother; Heart disease  in his brother and brother; Hypertension in his brother, father, and mother; Stroke in his mother. There is no history of Colon cancer, Esophageal cancer, Rectal cancer, or Stomach cancer.  Review of Systems  Constitutional: Negative for fever.  Respiratory: Negative for cough.   Gastrointestinal: Negative for hematochezia.  Genitourinary: Negative for hematuria.     EKGs/Labs/Other Test Reviewed:    EKG:  EKG is  ordered today.  The ekg ordered today demonstrates atrial fibrillation, HR 85, normal axis, nonspecific ST-T wave changes, similar to prior tracings  Recent Labs: 02/25/2019: B Natriuretic Peptide 157.3 02/27/2019: Magnesium 2.1 03/02/2019: ALT 70; BUN 29; Creatinine, Ser 1.57; Hemoglobin 9.6; Platelets 260; Potassium 4.1; Sodium 142   Recent Lipid Panel Lab Results  Component Value Date/Time   CHOL 83 (L) 12/28/2018 02:39 PM   TRIG 82 12/28/2018 02:39 PM   HDL 41 12/28/2018 02:39 PM   CHOLHDL 2.0 12/28/2018 02:39 PM   CHOLHDL 2.6 03/21/2015 05:33 AM   LDLCALC 25 12/28/2018 02:39 PM   Labs from PCP personally reviewed and interpreted (KPN Tool): 08/09/2019: LDL 39, triglycerides 102, hemoglobin 11.6, ALT 17, creatinine 2.2, K+ 4.7, albumin 3.4, TSH 4.94, 11/28/2019: Creatinine 2.3, ALT 24, Hgb 12.1, TSH 0.390, BNP 83.1  Physical Exam:    VS:  BP 120/78   Pulse 85   Ht _0  (1.753 m)   Wt 285 lb (129.3 kg)   SpO2 92%   BMI 42.09 kg/m     Wt Readings from Last 3 Encounters:  12/04/19 285 lb (129.3 kg)  06/07/19 234 lb 12.8 oz (106.5 kg)  02/28/19 270 lb (122.5 kg)     Constitutional:      Appearance: Healthy appearance. Not in distress.  Neck:     Vascular: No JVR.  Pulmonary:     Effort: Pulmonary effort is normal.     Breath sounds: No wheezing. No rales.     Comments: Bibasilar crackles that cleared completely with cough Cardiovascular:     Normal rate. Regularly irregular rhythm. Normal S1. Normal S2.     Murmurs: There is no murmur.  Edema:     Peripheral edema present.    Pretibial: bilateral 1+ edema of the pretibial area.    Ankle: bilateral 2+ edema of the ankle. Abdominal:     General: There is no distension.     Palpations: Abdomen is soft.  Musculoskeletal:     Cervical back: Neck supple. Skin:    General: Skin is warm and dry.  Neurological:     General: No focal deficit present.     Mental Status: Alert and oriented to person, place and time.     Cranial Nerves: Cranial nerves are intact.       ASSESSMENT & PLAN:    1. Chronic systolic CHF (congestive heart failure) ([place]) 2. Shortness of breath EF 45-50 by most recent echocardiogram in May 2021.  His shortness of breath is likely multifactorial and related to deconditioning, debility from previous stroke, CHF, coronary disease, atrial fibrillation and COPD.  His weights are not really all that reliable.  However, it seems that his weight has increased over time.  His exam is difficult.  He did have rales initially but these cleared with cough.  He has fairly chronic appearing lower extremity swelling.  His diet is rich in salt.  We discussed the importance of limiting his salt intake in terms of treating his heart failure.  Given his PFTs in April, question if he would benefit from referral to pulmonology.  I will increase his furosemide to 60 mg twice daily.  Obtain a BMET, BNP today.  Repeat a BMET in 1 week.  I will see him back in 2 weeks for close follow-up.  Addendum: I received labs and notes from his PCP after he left the office today.  He has actually been referred to pulmonology.  Also recent BNP was normal.  I will see what his repeat BNP looks like today.  I will continue him on higher dose furosemide to see if this improves any of his symptoms.  As noted, he will have close follow-up on renal function potassium with repeat BMET next week.  3. Stage 3b chronic kidney disease ([place]) I would keep a close eye on his renal function and potassium with adjustments in  his furosemide as outlined above.  4. Coronary artery disease involving native coronary artery of native heart without angina pectoris History of inferior STEMI in 2010 treated with a bare-metal stent to the RCA.  He had a cardiac catheterization in 2011 with patent RCA stent and a low risk Myoview in 2017.  He is not currently having anginal symptoms.  He is not on aspirin as he is on Apixaban.  Continue isosorbide, metoprolol, rosuvastatin.  5. Permanent atrial fibrillation ([place]) Rate is well controlled.  He is on the appropriate dose of Apixaban given his renal function and age.  6. Essential hypertension The patient's blood pressure is controlled on his current regimen.  Continue current therapy.     Dispo:  Return in about 2 weeks (around 12/18/2019) for Close Follow Up, w/ Dr. Burt [name], or Richardson Dopp, PA-C, in person.   Medication Adjustments/Labs and Tests Ordered: Current medicines are reviewed at length with the patient today.  Concerns regarding medicines are outlined above.  Tests Ordered: Orders Placed This Encounter  Procedures  . Basic metabolic panel  . Pro b natriuretic peptide (BNP)  . Basic metabolic panel  . EKG 12-Lead   Medication Changes: Meds ordered this encounter  Medications  . furosemide (LASIX) 40 MG tablet    Sig: Take 1.5 tablets (60 mg total) by mouth daily.    Dispense:  135 tablet    Refill:  1    Signed, Richardson Dopp, PA-C  12/04/2019 10:34 AM    [address], [place],   [id number] Phone: [phone]; Fax: [phone]

## 2019-12-04 ENCOUNTER — Other Ambulatory Visit: Payer: Self-pay

## 2019-12-04 ENCOUNTER — Ambulatory Visit (INDEPENDENT_AMBULATORY_CARE_PROVIDER_SITE_OTHER): Payer: Medicare Other | Admitting: Physician Assistant

## 2019-12-04 ENCOUNTER — Encounter: Payer: Self-pay | Admitting: Physician Assistant

## 2019-12-04 VITALS — BP 120/78 | HR 85 | Ht 69.0 in | Wt 285.0 lb

## 2019-12-04 DIAGNOSIS — I4821 Permanent atrial fibrillation: Secondary | ICD-10-CM | POA: Diagnosis not present

## 2019-12-04 DIAGNOSIS — E782 Mixed hyperlipidemia: Secondary | ICD-10-CM

## 2019-12-04 DIAGNOSIS — I6523 Occlusion and stenosis of bilateral carotid arteries: Secondary | ICD-10-CM

## 2019-12-04 DIAGNOSIS — I25119 Atherosclerotic heart disease of native coronary artery with unspecified angina pectoris: Secondary | ICD-10-CM | POA: Diagnosis not present

## 2019-12-04 DIAGNOSIS — I5022 Chronic systolic (congestive) heart failure: Secondary | ICD-10-CM

## 2019-12-04 DIAGNOSIS — I255 Ischemic cardiomyopathy: Secondary | ICD-10-CM

## 2019-12-04 DIAGNOSIS — I2510 Atherosclerotic heart disease of native coronary artery without angina pectoris: Secondary | ICD-10-CM

## 2019-12-04 DIAGNOSIS — N1832 Chronic kidney disease, stage 3b: Secondary | ICD-10-CM

## 2019-12-04 DIAGNOSIS — I10 Essential (primary) hypertension: Secondary | ICD-10-CM | POA: Diagnosis not present

## 2019-12-04 DIAGNOSIS — R0602 Shortness of breath: Secondary | ICD-10-CM | POA: Diagnosis not present

## 2019-12-04 MED ORDER — FUROSEMIDE 40 MG PO TABS
60.0000 mg | ORAL_TABLET | Freq: Every day | ORAL | 1 refills | Status: DC
Start: 2019-12-04 — End: 2019-12-06

## 2019-12-04 NOTE — Patient Instructions (Addendum)
Medication Instructions:  Your physician has recommended you make the following change in your medication:   1) Increase Lasix to 60 mg (1.5 tablets) by mouth once a day  *If you need a refill on your cardiac medications before your next appointment, please call your pharmacy*  Lab Work: You will have labs drawn today: BMET/BNP  Your physician recommends that you return for lab work in 1 week on 12/11/19 **The lab is open from 7:30AM-4:30PM** You may come anytime between these hours  Testing/Procedures: None ordered today   Follow-Up: On 12/29/19 at 8:45AM with Richardson Dopp, PA-C   Other Instructions Two Gram Sodium Diet 2000 mg  What is Sodium? Sodium is a mineral found naturally in many foods. The most significant source of sodium in the diet is table salt, which is about 40% sodium.  Processed, convenience, and preserved foods also contain a large amount of sodium.  The body needs only 500 mg of sodium daily to function,  A normal diet provides more than enough sodium even if you do not use salt.  Why Limit Sodium? A build up of sodium in the body can cause thirst, increased blood pressure, shortness of breath, and water retention.  Decreasing sodium in the diet can reduce edema and risk of heart attack or stroke associated with high blood pressure.  Keep in mind that there are many other factors involved in these health problems.  Heredity, obesity, lack of exercise, cigarette smoking, stress and what you eat all play a role.  General Guidelines:  Do not add salt at the table or in cooking.  One teaspoon of salt contains over 2 grams of sodium.  Read food labels  Avoid processed and convenience foods  Ask your dietitian before eating any foods not dicussed in the menu planning guidelines  Consult your physician if you wish to use a salt substitute or a sodium containing medication such as antacids.  Limit milk and milk products to 16 oz (2 cups) per day.  Shopping  Hints:  READ LABELS!! "Dietetic" does not necessarily mean low sodium.  Salt and other sodium ingredients are often added to foods during processing.   Menu Planning Guidelines Food Group Choose More Often Avoid  Beverages (see also the milk group All fruit juices, low-sodium, salt-free vegetables juices, low-sodium carbonated beverages Regular vegetable or tomato juices, commercially softened water used for drinking or cooking  Breads and Cereals Enriched white, wheat, rye and pumpernickel bread, hard rolls and dinner rolls; muffins, cornbread and waffles; most dry cereals, cooked cereal without added salt; unsalted crackers and breadsticks; low sodium or homemade bread crumbs Bread, rolls and crackers with salted tops; quick breads; instant hot cereals; pancakes; commercial bread stuffing; self-rising flower and biscuit mixes; regular bread crumbs or cracker crumbs  Desserts and Sweets Desserts and sweets mad with mild should be within allowance Instant pudding mixes and cake mixes  Fats Butter or margarine; vegetable oils; unsalted salad dressings, regular salad dressings limited to 1 Tbs; light, sour and heavy cream Regular salad dressings containing bacon fat, bacon bits, and salt pork; snack dips made with instant soup mixes or processed cheese; salted nuts  Fruits Most fresh, frozen and canned fruits Fruits processed with salt or sodium-containing ingredient (some dried fruits are processed with sodium sulfites        Vegetables Fresh, frozen vegetables and low- sodium canned vegetables Regular canned vegetables, sauerkraut, pickled vegetables, and others prepared in brine; frozen vegetables in sauces; vegetables seasoned with ham, bacon  or salt pork  Condiments, Sauces, Miscellaneous  Salt substitute with physician's approval; pepper, herbs, spices; vinegar, lemon or lime juice; hot pepper sauce; garlic powder, onion powder, low sodium soy sauce (1 Tbs.); low sodium condiments (ketchup,  chili sauce, mustard) in limited amounts (1 tsp.) fresh ground horseradish; unsalted tortilla chips, pretzels, potato chips, popcorn, salsa (1/4 cup) Any seasoning made with salt including garlic salt, celery salt, onion salt, and seasoned salt; sea salt, rock salt, kosher salt; meat tenderizers; monosodium glutamate; mustard, regular soy sauce, barbecue, sauce, chili sauce, teriyaki sauce, steak sauce, Worcestershire sauce, and most flavored vinegars; canned gravy and mixes; regular condiments; salted snack foods, olives, picles, relish, horseradish sauce, catsup   Food preparation: Try these seasonings Meats:    Pork Sage, onion Serve with applesauce  Chicken Poultry seasoning, thyme, parsley Serve with cranberry sauce  Lamb Curry powder, rosemary, garlic, thyme Serve with mint sauce or jelly  Veal Marjoram, basil Serve with current jelly, cranberry sauce  Beef Pepper, bay leaf Serve with dry mustard, unsalted chive butter  Fish [place] leaf, dill Serve with unsalted lemon butter, unsalted parsley butter  Vegetables:    Asparagus Lemon juice   Broccoli Lemon juice   Carrots Mustard dressing parsley, mint, nutmeg, glazed with unsalted butter and sugar   Green beans Marjoram, lemon juice, nutmeg,dill seed   Tomatoes Basil, marjoram, onion   Spice /blend for [employer]" 4 tsp ground thyme 1 tsp ground sage 3 tsp ground rosemary 4 tsp ground marjoram   Test your knowledge 1. A product that says "Salt Free" may still contain sodium. True or False 2. Garlic Powder and Hot Pepper Sauce an be used as alternative seasonings.True or False 3. Processed foods have more sodium than fresh foods.  True or False 4. Canned Vegetables have less sodium than froze True or False  WAYS TO DECREASE YOUR SODIUM INTAKE 1. Avoid the use of added salt in cooking and at the table.  Table salt (and other prepared seasonings which contain salt) is probably one of the greatest sources of sodium in the diet.  Unsalted  foods can gain flavor from the sweet, sour, and butter taste sensations of herbs and spices.  Instead of using salt for seasoning, try the following seasonings with the foods listed.  Remember: how you use them to enhance natural food flavors is limited only by your creativity... Allspice-Meat, fish, eggs, fruit, peas, red and yellow vegetables Almond Extract-Fruit baked goods Anise Seed-Sweet breads, fruit, carrots, beets, cottage cheese, cookies (tastes like licorice) Basil-Meat, fish, eggs, vegetables, rice, vegetables salads, soups, sauces Bay Leaf-Meat, fish, stews, poultry Burnet-Salad, vegetables (cucumber-like flavor) Caraway Seed-Bread, cookies, cottage cheese, meat, vegetables, cheese, rice Cardamon-Baked goods, fruit, soups Celery Powder or seed-Salads, salad dressings, sauces, meatloaf, soup, bread.Do not use  celery salt Chervil-Meats, salads, fish, eggs, vegetables, cottage cheese (parsley-like flavor) Chili Power-Meatloaf, chicken cheese, corn, eggplant, egg dishes Chives-Salads cottage cheese, egg dishes, soups, vegetables, sauces Cilantro-Salsa, casseroles Cinnamon-Baked goods, fruit, pork, lamb, chicken, carrots Cloves-Fruit, baked goods, fish, pot roast, green beans, beets, carrots Coriander-Pastry, cookies, meat, salads, cheese (lemon-orange flavor) Cumin-Meatloaf, fish,cheese, eggs, cabbage,fruit pie (caraway flavor) [employer], fruit, eggs, fish, poultry, cottage cheese, vegetables Dill Seed-Meat, cottage cheese, poultry, vegetables, fish, salads, bread Fennel Seed-Bread, cookies, apples, pork, eggs, fish, beets, cabbage, cheese, Licorice-like flavor Garlic-(buds or powder) Salads, meat, poultry, fish, bread, butter, vegetables, potatoes.Do not  use garlic salt Ginger-Fruit, vegetables, baked goods, meat, fish, poultry Horseradish Root-Meet, vegetables, butter Lemon Juice or Extract-Vegetables, fruit,  tea, baked goods, fish salads Mace-Baked goods fruit,  vegetables, fish, poultry (taste like nutmeg) Maple Extract-Syrups Marjoram-Meat, chicken, fish, vegetables, breads, green salads (taste like Sage) Mint-Tea, lamb, sherbet, vegetables, desserts, carrots, cabbage Mustard, Dry or Seed-Cheese, eggs, meats, vegetables, poultry Nutmeg-Baked goods, fruit, chicken, eggs, vegetables, desserts Onion Powder-Meat, fish, poultry, vegetables, cheese, eggs, bread, rice salads (Do not use   Onion salt) Orange Extract-Desserts, baked goods Oregano-Pasta, eggs, cheese, onions, pork, lamb, fish, chicken, vegetables, green salads Paprika-Meat, fish, poultry, eggs, cheese, vegetables Parsley Flakes-Butter, vegetables, meat fish, poultry, eggs, bread, salads (certain forms may   Contain sodium Pepper-Meat fish, poultry, vegetables, eggs Peppermint Extract-Desserts, baked goods Poppy Seed-Eggs, bread, cheese, fruit dressings, baked goods, noodles, vegetables, cottage  [employer], poultry, meat, fish, cauliflower, turnips,eggs bread Saffron-Rice, bread, veal, chicken, fish, eggs Sage-Meat, fish, poultry, onions, eggplant, tomateos, pork, stews Savory-Eggs, salads, poultry, meat, rice, vegetables, soups, pork Tarragon-Meat, poultry, fish, eggs, butter, vegetables (licorice-like flavor)  Thyme-Meat, poultry, fish, eggs, vegetables, (clover-like flavor), sauces, soups Tumeric-Salads, butter, eggs, fish, rice, vegetables (saffron-like flavor) Vanilla Extract-Baked goods, candy Vinegar-Salads, vegetables, meat marinades Walnut Extract-baked goods, candy  2. Choose your Foods Wisely   The following is a list of foods to avoid which are high in sodium:  Meats-Avoid all smoked, canned, salt cured, dried and kosher meat and fish as well as Anchovies   [employer] meats:Bologna, Liverwurst, Pastrami Canned meat or fish  Marinated herring Caviar    Pepperoni Corned Beef   Pizza Dried chipped beef  Salami Frozen  breaded fish or meat Salt pork Frankfurters or hot dogs  Sardines Gefilte fish   Sausage Ham (boiled ham, Proscuitto Smoked butt    spiced ham)   Spam      TV Dinners Vegetables Canned vegetables (Regular) Relish Canned mushrooms  Sauerkraut Olives    Tomato juice Pickles  Bakery and Dessert Products Canned puddings  Cream pies Cheesecake   Decorated cakes Cookies  Beverages/Juices Tomato juice, regular  Gatorade   V-8 vegetable juice, regular  Breads and Cereals Biscuit mixes   Salted potato chips, corn chips, pretzels Bread stuffing mixes  Salted crackers and rolls Pancake and waffle mixes Self-rising flour  Seasonings Accent    Meat sauces Barbecue sauce  Meat tenderizer Catsup    Monosodium glutamate (MSG) Celery salt   Onion salt Chili sauce   Prepared mustard Garlic salt   Salt, seasoned salt, sea salt Gravy mixes   Soy sauce Horseradish   Steak sauce Ketchup   Tartar sauce Lite salt    Teriyaki sauce Marinade mixes   Worcestershire sauce  Others Baking powder   Cocoa and cocoa mixes Baking soda   Commercial casserole mixes Candy-caramels, chocolate  Dehydrated soups    Bars, fudge,nougats  Instant rice and pasta mixes Canned broth or soup  Maraschino cherries Cheese, aged and processed cheese and cheese spreads  Learning Assessment Quiz  Indicated T (for True) or F (for False) for each of the following statements:  1. _____ Fresh fruits and vegetables and unprocessed grains are generally low in sodium 2. _____ Water may contain a considerable amount of sodium, depending on the source 3. _____ You can always tell if a food is high in sodium by tasting it 4. _____ Certain laxatives my be high in sodium and should be avoided unless prescribed   by a physician or pharmacist 5. _____ Salt substitutes may be used freely by anyone on a sodium restricted diet 6. _____  Sodium is present in table salt, food additives and as a natural component of   most  foods 7. _____ Table salt is approximately 90% sodium 8. _____ Limiting sodium intake may help prevent excess fluid accumulation in the body 9. _____ On a sodium-restricted diet, seasonings such as bouillon soy sauce, and    cooking wine should be used in place of table salt 10. _____ On an ingredient list, a product which lists monosodium glutamate as the first   ingredient is an appropriate food to include on a low sodium diet  Circle the best answer(s) to the following statements (Hint: there may be more than one correct answer)  11. On a low-sodium diet, some acceptable snack items are:    A. Olives  F. Bean dip   K. Grapefruit juice    B. Salted Pretzels G. Commercial Popcorn   L. Canned peaches    C. Carrot Sticks  H. Bouillon   M. Unsalted nuts   D. [place] fries  I. Peanut butter crackers N. Salami   E. Sweet pickles J. Tomato Juice   O. Pizza  12.  Seasonings that may be used freely on a reduced - sodium diet include   A. Lemon wedges F.Monosodium glutamate K. Celery seed    B.Soysauce   G. Pepper   L. Mustard powder   C. Sea salt  H. Cooking wine  M. Onion flakes   D. Vinegar  E. Prepared horseradish N. Salsa   E. Sage   J. Worcestershire sauce  O. Chutney

## 2019-12-05 LAB — BASIC METABOLIC PANEL
BUN/Creatinine Ratio: 24 (ref 10–24)
BUN: 50 mg/dL — ABNORMAL HIGH (ref 8–27)
CO2: 24 mmol/L (ref 20–29)
Calcium: 9.6 mg/dL (ref 8.6–10.2)
Chloride: 101 mmol/L (ref 96–106)
Creatinine, Ser: 2.12 mg/dL — ABNORMAL HIGH (ref 0.76–1.27)
GFR calc Af Amer: 33 mL/min/{1.73_m2} — ABNORMAL LOW (ref >59)
GFR calc non Af Amer: 28 mL/min/{1.73_m2} — ABNORMAL LOW (ref >59)
Glucose: 108 mg/dL — ABNORMAL HIGH (ref 65–99)
Potassium: 4.4 mmol/L (ref 3.5–5.2)
Sodium: 141 mmol/L (ref 134–144)

## 2019-12-05 LAB — PRO B NATRIURETIC PEPTIDE: NT-Pro BNP: 1167 pg/mL — ABNORMAL HIGH (ref 0–486)

## 2019-12-06 MED ORDER — FUROSEMIDE 40 MG PO TABS: 80.0000 mg | ORAL_TABLET | Freq: Two times a day (BID) | ORAL | Status: AC

## 2019-12-06 NOTE — Telephone Encounter (Signed)
Ok.  I corrected his dose in his chart. If he feels he would like to increase the Lasix, he can take 120 mg in the AM and 80 mg in the PM for 3 days. We would need to get a BMET 1 week later if he does this.  Otherwise, work on limiting salt in his diet, keep legs elevated. Richardson Dopp, PA-C    12/06/2019 5:21 PM

## 2019-12-11 ENCOUNTER — Other Ambulatory Visit: Payer: Medicare Other

## 2019-12-29 ENCOUNTER — Ambulatory Visit: Payer: Medicare Other | Admitting: Physician Assistant

## 2019-12-29 ENCOUNTER — Encounter: Payer: Self-pay | Admitting: Pulmonary Disease

## 2019-12-29 ENCOUNTER — Ambulatory Visit (INDEPENDENT_AMBULATORY_CARE_PROVIDER_SITE_OTHER): Payer: Medicare Other | Admitting: Pulmonary Disease

## 2019-12-29 ENCOUNTER — Other Ambulatory Visit: Payer: Self-pay

## 2019-12-29 ENCOUNTER — Ambulatory Visit (INDEPENDENT_AMBULATORY_CARE_PROVIDER_SITE_OTHER): Payer: Medicare Other

## 2019-12-29 VITALS — BP 118/54 | HR 75 | Temp 97.3°F | Ht 69.0 in | Wt 286.0 lb

## 2019-12-29 DIAGNOSIS — J438 Other emphysema: Secondary | ICD-10-CM | POA: Diagnosis not present

## 2019-12-29 DIAGNOSIS — J449 Chronic obstructive pulmonary disease, unspecified: Secondary | ICD-10-CM | POA: Diagnosis not present

## 2019-12-29 MED ORDER — ALBUTEROL SULFATE (2.5 MG/3ML) 0.083% IN NEBU: 2.5000 mg | INHALATION_SOLUTION | Freq: Four times a day (QID) | RESPIRATORY_TRACT | 12 refills | Status: AC | PRN

## 2019-12-29 NOTE — Progress Notes (Signed)
[name]    [id number]    1940-06-14  Primary Care Physician:Shaw, Gwyndolyn Saxon, MD  Referring Physician: Marton Redwood, MD [address] [place],  [place] [address]  Chief complaint:   Patient is being seen for shortness of breath  HPI:  Shortness of breath for many months Unable to perform any activities without feeling short of breath  Only able to walk a few steps  Did smoke in the past and does have a diagnosis of obstructive lung disease Quit smoking in 2019 Was smoking up to 2 packs a day, however 120-pack-year smoking history  He is on Anoro and albuterol History of diastolic heart failure On diuretics  Does have an occasional cough Shortness of breath with minimal activity  Limited with activities of daily living He does live by himself Was accompanied today by a friend  Outpatient Encounter Medications as of 12/29/2019  Medication Sig  . albuterol (VENTOLIN HFA) 108 (90 Base) MCG/ACT inhaler Inhale into the lungs.  Jearl Klinefelter ELLIPTA 62.5-25 MCG/INH AEPB 1 puff daily.  Marland Kitchen apixaban (ELIQUIS) 2.5 MG TABS tablet Take 1 tablet (2.5 mg total) by mouth 2 (two) times daily.  . diazepam (VALIUM) 2 MG tablet Take 1 tablet (2 mg total) by mouth every 12 (twelve) hours as needed for anxiety.  . finasteride (PROSCAR) 5 MG tablet Take 5 mg by mouth 2 (two) times daily.   . furosemide (LASIX) 40 MG tablet Take 2 tablets (80 mg total) by mouth 2 (two) times daily.  Marland Kitchen HYDROcodone-acetaminophen (NORCO/VICODIN) 5-325 MG tablet Take 1 tablet by mouth 2 (two) times daily as needed for moderate pain.  . isosorbide mononitrate (IMDUR) 60 MG 24 hr tablet Take 1 tablet (60 mg total) by mouth daily.  Marland Kitchen KLOR-CON M10 10 MEQ tablet Take 20 mEq by mouth daily.  Marland Kitchen levothyroxine (SYNTHROID) 50 MCG tablet Take 50 mcg by mouth daily.  . metoprolol tartrate (LOPRESSOR) 25 MG tablet Take 25 mg by mouth 2 (two) times daily.  . mirtazapine (REMERON) 15 MG tablet Take 7.5 mg by mouth  daily.  . nitroGLYCERIN (NITROSTAT) 0.4 MG SL tablet Place 1 tablet (0.4 mg total) under the tongue every 5 (five) minutes as needed for chest pain.  Marland Kitchen omeprazole (PRILOSEC) 40 MG capsule Take 40 mg by mouth daily.  . rosuvastatin (CRESTOR) 20 MG tablet Take 20 mg by mouth at bedtime.  . sertraline (ZOLOFT) 100 MG tablet Take 100 mg by mouth 2 (two) times daily.   . tamsulosin (FLOMAX) 0.4 MG CAPS capsule Take 0.4 mg by mouth daily.   No facility-administered encounter medications on file as of 12/29/2019.    Allergies as of 12/29/2019 - Review Complete 12/29/2019  Allergen Reaction Noted  . Hctz [hydrochlorothiazide] Anaphylaxis and Other (See Comments) 02/05/2014  . Amitriptyline Other (See Comments) 12/06/2010  . Gabapentin Other (See Comments) 09/05/2008  . Metoprolol tartrate Other (See Comments) 02/05/2014  . Paroxetine Other (See Comments) 09/05/2008  . Spiriva handihaler [tiotropium bromide monohydrate] Other (See Comments) 02/05/2014    Past Medical History:  Diagnosis Date  . Anxiety   . Atrial fibrillation ([place])   . Bradycardia   . Carotid artery occlusion   . Cataract    bilataeral cateracts removed  . CKD (chronic kidney disease), stage III ([place])   . Colon polyps   . Common bile duct stone   . COPD (chronic obstructive pulmonary disease) ([place])   . 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 CVA (cerebral vascular accident) ([place])   . Depression   . DVT (deep venous thrombosis) ([place])   . Dyslipidemia   . Essential hypertension   . Foot pain   . GERD (gastroesophageal reflux disease)   . History of benign prostatic hypertrophy   . History of leukocytosis   . Hypertension   . MI, old   . Neuromuscular disorder ([facility])    neuropathy  . Neuropathy   . Peripheral vascular disease ([place])   . Renal failure, chronic    stage 111  . Tobacco abuse     Past Surgical History:  Procedure Laterality Date  . ANKLE SURGERY    .  BYPASS GRAFT  2009 or  2010   stent  . CHOLECYSTECTOMY     Gall Bladder  . COLONOSCOPY    . intervention of the RCA     using a single BMS    Family History  Problem Relation Age of Onset  . Stroke Mother   . Hypertension Mother   . Heart attack Mother   . Heart attack Brother   . Cancer Brother   . Hypertension Father   . Heart attack Father   . CVA Father   . Heart disease Brother   . Hypertension Brother   . AAA (abdominal aortic aneurysm) Brother   . Heart disease Brother   . Colon cancer Neg Hx   . Esophageal cancer Neg Hx   . Rectal cancer Neg Hx   . Stomach cancer Neg Hx     Social History   Socioeconomic History  . Marital status: Single    Spouse name: Not on file  . Number of children: Not on file  . Years of education: Not on file  . Highest education level: Not on file  Occupational History  . Not on file  Tobacco Use  . Smoking status: Former Smoker    Packs/day: 2.00    Years: 63.00    Pack years: 126.00    Types: Cigarettes    Quit date: 01/2018    Years since quitting: 1.9  . Smokeless tobacco: Never Used  Vaping Use  . Vaping Use: Never used  Substance and Sexual Activity  . Alcohol use: No    Alcohol/week: 0.0 standard drinks  . Drug use: No  . Sexual activity: Not on file  Other Topics Concern  . Not on file  Social History Narrative  . Not on file   Social Determinants of Health   Financial Resource Strain:   . Difficulty of Paying Living Expenses: Not on file  Food Insecurity:   . Worried About Charity fundraiser in the Last Year: Not on file  . Ran Out of Food in the Last Year: Not on file  Transportation Needs:   . Lack of Transportation (Medical): Not on file  . Lack of Transportation (Non-Medical): Not on file  Physical Activity:   . Days of Exercise per Week: Not on file  . Minutes of Exercise per Session: Not on file  Stress:   . Feeling of Stress : Not on file  Social Connections:   . Frequency of Communication  with Friends and Family: Not on file  . Frequency of Social Gatherings with Friends and Family: Not on file  . Attends Religious Services: Not on file  . Active Member of Clubs or Organizations: Not on file  . Attends Archivist Meetings: Not on file  .  Marital Status: Not on file  Intimate Partner Violence:   . Fear of Current or Ex-Partner: Not on file  . Emotionally Abused: Not on file  . Physically Abused: Not on file  . Sexually Abused: Not on file    Review of Systems  Constitutional: Positive for fatigue.  Respiratory: Positive for shortness of breath.   Cardiovascular: Positive for leg swelling.    Vitals:   12/29/19 1050  BP: (!) 118/54  Pulse: 75  Temp: (!) 97.3 F (36.3 C)  SpO2: 95%     Physical Exam Constitutional:      Appearance: He is obese.  HENT:     Head: Normocephalic.     Mouth/Throat:     Mouth: Mucous membranes are moist.  Eyes:     General:        Right eye: No discharge.        Left eye: No discharge.  Cardiovascular:     Rate and Rhythm: Normal rate and regular rhythm.     Heart sounds: No murmur heard.  No friction rub.  Pulmonary:     Effort: No respiratory distress.     Breath sounds: No stridor. No wheezing or rhonchi.  Musculoskeletal:     Cervical back: No rigidity or tenderness.     Right lower leg: Edema present.     Left lower leg: Edema present.  Skin:    Coloration: Skin is not jaundiced.  Neurological:     Mental Status: He is alert.  Psychiatric:        Mood and Affect: Mood normal.    Data Reviewed: Chest x-ray today shows no acute infiltrate PFT showed mild obstructive lung disease  Assessment:  COPD  Severe deconditioning  Diastolic heart failure  Plan/Recommendations: Continue bronchodilator treatments  We will prescribe a nebulizer with albuterol to be used as needed up to 4 times a day  Continue Anoro  The importance of graded physical activity was discussed extensively with the patient  as I think this may be playing a huge role in patient's shortness of breath This was discussed with his friend who was present with him as well Patient does live alone, the need to avoid unnecessary risk with risk of falls was also discussed Will not be able to easily get to physical therapy on a regular basis so it is important to be able to at least increase activity in his home environment as able  I will follow-up with him in about 3 months  Encouraged to call with any significant concerns   Sherrilyn Rist MD [facility] 12/29/2019, 11:20 AM  CC: Marton Redwood, MD

## 2019-12-29 NOTE — Patient Instructions (Addendum)
Shortness of breath  Multifactorial reasons for shortness of breath Obstructive lung disease Significant deconditioning  Obtain chest x-ray Nebulizer to use albuterol 4 times daily as needed  Importance of graded exercises discussed with the patient  We will follow up in 6 weeks  Call with significant concerns

## 2020-01-24 DIAGNOSIS — R069 Unspecified abnormalities of breathing: Secondary | ICD-10-CM | POA: Diagnosis not present

## 2020-01-24 DIAGNOSIS — R52 Pain, unspecified: Secondary | ICD-10-CM | POA: Diagnosis not present

## 2020-01-24 DIAGNOSIS — I4891 Unspecified atrial fibrillation: Secondary | ICD-10-CM | POA: Diagnosis not present

## 2020-02-14 ENCOUNTER — Other Ambulatory Visit: Payer: Self-pay | Admitting: Physician Assistant

## 2020-02-29 DIAGNOSIS — I87322 Chronic venous hypertension (idiopathic) with inflammation of left lower extremity: Secondary | ICD-10-CM | POA: Diagnosis not present

## 2020-02-29 DIAGNOSIS — L989 Disorder of the skin and subcutaneous tissue, unspecified: Secondary | ICD-10-CM | POA: Diagnosis not present

## 2020-02-29 DIAGNOSIS — I739 Peripheral vascular disease, unspecified: Secondary | ICD-10-CM | POA: Diagnosis not present

## 2020-02-29 DIAGNOSIS — J441 Chronic obstructive pulmonary disease with (acute) exacerbation: Secondary | ICD-10-CM | POA: Diagnosis not present

## 2020-03-07 DIAGNOSIS — I4891 Unspecified atrial fibrillation: Secondary | ICD-10-CM | POA: Diagnosis not present

## 2020-03-07 DIAGNOSIS — I739 Peripheral vascular disease, unspecified: Secondary | ICD-10-CM | POA: Diagnosis not present

## 2020-03-07 DIAGNOSIS — I87331 Chronic venous hypertension (idiopathic) with ulcer and inflammation of right lower extremity: Secondary | ICD-10-CM | POA: Diagnosis not present

## 2020-03-07 DIAGNOSIS — Z79891 Long term (current) use of opiate analgesic: Secondary | ICD-10-CM | POA: Diagnosis not present

## 2020-03-07 DIAGNOSIS — E785 Hyperlipidemia, unspecified: Secondary | ICD-10-CM | POA: Diagnosis not present

## 2020-03-07 DIAGNOSIS — Z95820 Peripheral vascular angioplasty status with implants and grafts: Secondary | ICD-10-CM | POA: Diagnosis not present

## 2020-03-07 DIAGNOSIS — R2681 Unsteadiness on feet: Secondary | ICD-10-CM | POA: Diagnosis not present

## 2020-03-07 DIAGNOSIS — I2510 Atherosclerotic heart disease of native coronary artery without angina pectoris: Secondary | ICD-10-CM | POA: Diagnosis not present

## 2020-03-07 DIAGNOSIS — L989 Disorder of the skin and subcutaneous tissue, unspecified: Secondary | ICD-10-CM | POA: Diagnosis not present

## 2020-03-07 DIAGNOSIS — I87332 Chronic venous hypertension (idiopathic) with ulcer and inflammation of left lower extremity: Secondary | ICD-10-CM | POA: Diagnosis not present

## 2020-03-07 DIAGNOSIS — F419 Anxiety disorder, unspecified: Secondary | ICD-10-CM | POA: Diagnosis not present

## 2020-03-07 DIAGNOSIS — I69398 Other sequelae of cerebral infarction: Secondary | ICD-10-CM | POA: Diagnosis not present

## 2020-03-07 DIAGNOSIS — I69322 Dysarthria following cerebral infarction: Secondary | ICD-10-CM | POA: Diagnosis not present

## 2020-03-07 DIAGNOSIS — Z7901 Long term (current) use of anticoagulants: Secondary | ICD-10-CM | POA: Diagnosis not present

## 2020-03-07 DIAGNOSIS — I10 Essential (primary) hypertension: Secondary | ICD-10-CM | POA: Diagnosis not present

## 2020-03-07 DIAGNOSIS — G629 Polyneuropathy, unspecified: Secondary | ICD-10-CM | POA: Diagnosis not present

## 2020-03-07 DIAGNOSIS — L97811 Non-pressure chronic ulcer of other part of right lower leg limited to breakdown of skin: Secondary | ICD-10-CM | POA: Diagnosis not present

## 2020-03-07 DIAGNOSIS — L97821 Non-pressure chronic ulcer of other part of left lower leg limited to breakdown of skin: Secondary | ICD-10-CM | POA: Diagnosis not present

## 2020-03-07 DIAGNOSIS — I252 Old myocardial infarction: Secondary | ICD-10-CM | POA: Diagnosis not present

## 2020-03-07 DIAGNOSIS — Z955 Presence of coronary angioplasty implant and graft: Secondary | ICD-10-CM | POA: Diagnosis not present

## 2020-03-07 DIAGNOSIS — R7301 Impaired fasting glucose: Secondary | ICD-10-CM | POA: Diagnosis not present

## 2020-03-07 DIAGNOSIS — Z7982 Long term (current) use of aspirin: Secondary | ICD-10-CM | POA: Diagnosis not present

## 2020-03-07 DIAGNOSIS — J441 Chronic obstructive pulmonary disease with (acute) exacerbation: Secondary | ICD-10-CM | POA: Diagnosis not present

## 2020-03-08 DIAGNOSIS — L97811 Non-pressure chronic ulcer of other part of right lower leg limited to breakdown of skin: Secondary | ICD-10-CM | POA: Diagnosis not present

## 2020-03-08 DIAGNOSIS — I87331 Chronic venous hypertension (idiopathic) with ulcer and inflammation of right lower extremity: Secondary | ICD-10-CM | POA: Diagnosis not present

## 2020-03-08 DIAGNOSIS — I87332 Chronic venous hypertension (idiopathic) with ulcer and inflammation of left lower extremity: Secondary | ICD-10-CM | POA: Diagnosis not present

## 2020-03-08 DIAGNOSIS — J441 Chronic obstructive pulmonary disease with (acute) exacerbation: Secondary | ICD-10-CM | POA: Diagnosis not present

## 2020-03-08 DIAGNOSIS — L97821 Non-pressure chronic ulcer of other part of left lower leg limited to breakdown of skin: Secondary | ICD-10-CM | POA: Diagnosis not present

## 2020-03-08 DIAGNOSIS — I739 Peripheral vascular disease, unspecified: Secondary | ICD-10-CM | POA: Diagnosis not present

## 2020-03-15 DIAGNOSIS — L97821 Non-pressure chronic ulcer of other part of left lower leg limited to breakdown of skin: Secondary | ICD-10-CM | POA: Diagnosis not present

## 2020-03-15 DIAGNOSIS — I87332 Chronic venous hypertension (idiopathic) with ulcer and inflammation of left lower extremity: Secondary | ICD-10-CM | POA: Diagnosis not present

## 2020-03-15 DIAGNOSIS — I87331 Chronic venous hypertension (idiopathic) with ulcer and inflammation of right lower extremity: Secondary | ICD-10-CM | POA: Diagnosis not present

## 2020-03-15 DIAGNOSIS — J441 Chronic obstructive pulmonary disease with (acute) exacerbation: Secondary | ICD-10-CM | POA: Diagnosis not present

## 2020-03-15 DIAGNOSIS — L97811 Non-pressure chronic ulcer of other part of right lower leg limited to breakdown of skin: Secondary | ICD-10-CM | POA: Diagnosis not present

## 2020-03-15 DIAGNOSIS — I739 Peripheral vascular disease, unspecified: Secondary | ICD-10-CM | POA: Diagnosis not present

## 2020-03-20 DIAGNOSIS — I739 Peripheral vascular disease, unspecified: Secondary | ICD-10-CM | POA: Diagnosis not present

## 2020-03-20 DIAGNOSIS — I87331 Chronic venous hypertension (idiopathic) with ulcer and inflammation of right lower extremity: Secondary | ICD-10-CM | POA: Diagnosis not present

## 2020-03-20 DIAGNOSIS — I87332 Chronic venous hypertension (idiopathic) with ulcer and inflammation of left lower extremity: Secondary | ICD-10-CM | POA: Diagnosis not present

## 2020-03-20 DIAGNOSIS — J441 Chronic obstructive pulmonary disease with (acute) exacerbation: Secondary | ICD-10-CM | POA: Diagnosis not present

## 2020-03-20 DIAGNOSIS — L97821 Non-pressure chronic ulcer of other part of left lower leg limited to breakdown of skin: Secondary | ICD-10-CM | POA: Diagnosis not present

## 2020-03-20 DIAGNOSIS — L97811 Non-pressure chronic ulcer of other part of right lower leg limited to breakdown of skin: Secondary | ICD-10-CM | POA: Diagnosis not present

## 2020-03-27 DIAGNOSIS — J441 Chronic obstructive pulmonary disease with (acute) exacerbation: Secondary | ICD-10-CM | POA: Diagnosis not present

## 2020-03-27 DIAGNOSIS — L97811 Non-pressure chronic ulcer of other part of right lower leg limited to breakdown of skin: Secondary | ICD-10-CM | POA: Diagnosis not present

## 2020-03-27 DIAGNOSIS — L97821 Non-pressure chronic ulcer of other part of left lower leg limited to breakdown of skin: Secondary | ICD-10-CM | POA: Diagnosis not present

## 2020-03-27 DIAGNOSIS — I87332 Chronic venous hypertension (idiopathic) with ulcer and inflammation of left lower extremity: Secondary | ICD-10-CM | POA: Diagnosis not present

## 2020-03-27 DIAGNOSIS — I739 Peripheral vascular disease, unspecified: Secondary | ICD-10-CM | POA: Diagnosis not present

## 2020-03-27 DIAGNOSIS — I87331 Chronic venous hypertension (idiopathic) with ulcer and inflammation of right lower extremity: Secondary | ICD-10-CM | POA: Diagnosis not present

## 2020-04-03 DIAGNOSIS — I5022 Chronic systolic (congestive) heart failure: Secondary | ICD-10-CM | POA: Diagnosis not present

## 2020-04-03 DIAGNOSIS — I87331 Chronic venous hypertension (idiopathic) with ulcer and inflammation of right lower extremity: Secondary | ICD-10-CM | POA: Diagnosis not present

## 2020-04-03 DIAGNOSIS — R0600 Dyspnea, unspecified: Secondary | ICD-10-CM | POA: Diagnosis not present

## 2020-04-03 DIAGNOSIS — I739 Peripheral vascular disease, unspecified: Secondary | ICD-10-CM | POA: Diagnosis not present

## 2020-04-03 DIAGNOSIS — E039 Hypothyroidism, unspecified: Secondary | ICD-10-CM | POA: Diagnosis not present

## 2020-04-03 DIAGNOSIS — J441 Chronic obstructive pulmonary disease with (acute) exacerbation: Secondary | ICD-10-CM | POA: Diagnosis not present

## 2020-04-03 DIAGNOSIS — E785 Hyperlipidemia, unspecified: Secondary | ICD-10-CM | POA: Diagnosis not present

## 2020-04-03 DIAGNOSIS — L97821 Non-pressure chronic ulcer of other part of left lower leg limited to breakdown of skin: Secondary | ICD-10-CM | POA: Diagnosis not present

## 2020-04-03 DIAGNOSIS — Z7901 Long term (current) use of anticoagulants: Secondary | ICD-10-CM | POA: Diagnosis not present

## 2020-04-03 DIAGNOSIS — I480 Paroxysmal atrial fibrillation: Secondary | ICD-10-CM | POA: Diagnosis not present

## 2020-04-03 DIAGNOSIS — F3341 Major depressive disorder, recurrent, in partial remission: Secondary | ICD-10-CM | POA: Diagnosis not present

## 2020-04-03 DIAGNOSIS — I2510 Atherosclerotic heart disease of native coronary artery without angina pectoris: Secondary | ICD-10-CM | POA: Diagnosis not present

## 2020-04-03 DIAGNOSIS — I87332 Chronic venous hypertension (idiopathic) with ulcer and inflammation of left lower extremity: Secondary | ICD-10-CM | POA: Diagnosis not present

## 2020-04-03 DIAGNOSIS — N184 Chronic kidney disease, stage 4 (severe): Secondary | ICD-10-CM | POA: Diagnosis not present

## 2020-04-03 DIAGNOSIS — I10 Essential (primary) hypertension: Secondary | ICD-10-CM | POA: Diagnosis not present

## 2020-04-03 DIAGNOSIS — J449 Chronic obstructive pulmonary disease, unspecified: Secondary | ICD-10-CM | POA: Diagnosis not present

## 2020-04-03 DIAGNOSIS — L97811 Non-pressure chronic ulcer of other part of right lower leg limited to breakdown of skin: Secondary | ICD-10-CM | POA: Diagnosis not present

## 2020-04-06 ENCOUNTER — Inpatient Hospital Stay (HOSPITAL_COMMUNITY)
Admission: EM | Admit: 2020-04-06 | Discharge: 2020-04-10 | DRG: 190 | Disposition: A | Discharge status: Home Health | Payer: Medicare Other | Attending: Family Medicine | Admitting: Family Medicine

## 2020-04-06 ENCOUNTER — Other Ambulatory Visit: Payer: Self-pay

## 2020-04-06 ENCOUNTER — Encounter (HOSPITAL_COMMUNITY): Payer: Self-pay

## 2020-04-06 ENCOUNTER — Emergency Department (HOSPITAL_COMMUNITY): Payer: Medicare Other

## 2020-04-06 DIAGNOSIS — E782 Mixed hyperlipidemia: Secondary | ICD-10-CM

## 2020-04-06 DIAGNOSIS — I5022 Chronic systolic (congestive) heart failure: Secondary | ICD-10-CM | POA: Diagnosis not present

## 2020-04-06 DIAGNOSIS — I739 Peripheral vascular disease, unspecified: Secondary | ICD-10-CM | POA: Diagnosis not present

## 2020-04-06 DIAGNOSIS — J440 Chronic obstructive pulmonary disease with acute lower respiratory infection: Principal | ICD-10-CM

## 2020-04-06 DIAGNOSIS — Z20822 Contact with and (suspected) exposure to covid-19: Secondary | ICD-10-CM | POA: Diagnosis present

## 2020-04-06 DIAGNOSIS — Z8673 Personal history of transient ischemic attack (TIA), and cerebral infarction without residual deficits: Secondary | ICD-10-CM | POA: Diagnosis not present

## 2020-04-06 DIAGNOSIS — I2510 Atherosclerotic heart disease of native coronary artery without angina pectoris: Secondary | ICD-10-CM | POA: Diagnosis present

## 2020-04-06 DIAGNOSIS — N179 Acute kidney failure, unspecified: Secondary | ICD-10-CM | POA: Diagnosis not present

## 2020-04-06 DIAGNOSIS — Z7901 Long term (current) use of anticoagulants: Secondary | ICD-10-CM

## 2020-04-06 DIAGNOSIS — R0602 Shortness of breath: Secondary | ICD-10-CM | POA: Diagnosis not present

## 2020-04-06 DIAGNOSIS — R0902 Hypoxemia: Secondary | ICD-10-CM

## 2020-04-06 DIAGNOSIS — F419 Anxiety disorder, unspecified: Secondary | ICD-10-CM | POA: Diagnosis not present

## 2020-04-06 DIAGNOSIS — N1832 Chronic kidney disease, stage 3b: Secondary | ICD-10-CM | POA: Diagnosis not present

## 2020-04-06 DIAGNOSIS — J209 Acute bronchitis, unspecified: Secondary | ICD-10-CM | POA: Diagnosis not present

## 2020-04-06 DIAGNOSIS — Z87891 Personal history of nicotine dependence: Secondary | ICD-10-CM

## 2020-04-06 DIAGNOSIS — I5033 Acute on chronic diastolic (congestive) heart failure: Secondary | ICD-10-CM | POA: Diagnosis not present

## 2020-04-06 DIAGNOSIS — L97821 Non-pressure chronic ulcer of other part of left lower leg limited to breakdown of skin: Secondary | ICD-10-CM | POA: Diagnosis not present

## 2020-04-06 DIAGNOSIS — G629 Polyneuropathy, unspecified: Secondary | ICD-10-CM | POA: Diagnosis not present

## 2020-04-06 DIAGNOSIS — I482 Chronic atrial fibrillation, unspecified: Secondary | ICD-10-CM | POA: Diagnosis not present

## 2020-04-06 DIAGNOSIS — I959 Hypotension, unspecified: Secondary | ICD-10-CM | POA: Diagnosis not present

## 2020-04-06 DIAGNOSIS — Z888 Allergy status to other drugs, medicaments and biological substances status: Secondary | ICD-10-CM

## 2020-04-06 DIAGNOSIS — I10 Essential (primary) hypertension: Secondary | ICD-10-CM | POA: Diagnosis not present

## 2020-04-06 DIAGNOSIS — I69398 Other sequelae of cerebral infarction: Secondary | ICD-10-CM | POA: Diagnosis not present

## 2020-04-06 DIAGNOSIS — I87331 Chronic venous hypertension (idiopathic) with ulcer and inflammation of right lower extremity: Secondary | ICD-10-CM | POA: Diagnosis not present

## 2020-04-06 DIAGNOSIS — R000 Tachycardia, unspecified: Secondary | ICD-10-CM | POA: Diagnosis present

## 2020-04-06 DIAGNOSIS — Z79899 Other long term (current) drug therapy: Secondary | ICD-10-CM

## 2020-04-06 DIAGNOSIS — R2681 Unsteadiness on feet: Secondary | ICD-10-CM | POA: Diagnosis not present

## 2020-04-06 DIAGNOSIS — J441 Chronic obstructive pulmonary disease with (acute) exacerbation: Secondary | ICD-10-CM

## 2020-04-06 DIAGNOSIS — Z79890 Hormone replacement therapy (postmenopausal): Secondary | ICD-10-CM

## 2020-04-06 DIAGNOSIS — R062 Wheezing: Secondary | ICD-10-CM | POA: Diagnosis not present

## 2020-04-06 DIAGNOSIS — R9431 Abnormal electrocardiogram [ECG] [EKG]: Secondary | ICD-10-CM

## 2020-04-06 DIAGNOSIS — Z66 Do not resuscitate: Secondary | ICD-10-CM | POA: Diagnosis not present

## 2020-04-06 DIAGNOSIS — D649 Anemia, unspecified: Secondary | ICD-10-CM | POA: Diagnosis present

## 2020-04-06 DIAGNOSIS — Z8249 Family history of ischemic heart disease and other diseases of the circulatory system: Secondary | ICD-10-CM

## 2020-04-06 DIAGNOSIS — E039 Hypothyroidism, unspecified: Secondary | ICD-10-CM | POA: Diagnosis not present

## 2020-04-06 DIAGNOSIS — N400 Enlarged prostate without lower urinary tract symptoms: Secondary | ICD-10-CM | POA: Diagnosis present

## 2020-04-06 DIAGNOSIS — I5043 Acute on chronic combined systolic (congestive) and diastolic (congestive) heart failure: Secondary | ICD-10-CM | POA: Diagnosis present

## 2020-04-06 DIAGNOSIS — L989 Disorder of the skin and subcutaneous tissue, unspecified: Secondary | ICD-10-CM | POA: Diagnosis not present

## 2020-04-06 DIAGNOSIS — B372 Candidiasis of skin and nail: Secondary | ICD-10-CM | POA: Diagnosis present

## 2020-04-06 DIAGNOSIS — F32A Depression, unspecified: Secondary | ICD-10-CM | POA: Diagnosis present

## 2020-04-06 DIAGNOSIS — R0789 Other chest pain: Secondary | ICD-10-CM | POA: Diagnosis not present

## 2020-04-06 DIAGNOSIS — I130 Hypertensive heart and chronic kidney disease with heart failure and stage 1 through stage 4 chronic kidney disease, or unspecified chronic kidney disease: Secondary | ICD-10-CM | POA: Diagnosis present

## 2020-04-06 DIAGNOSIS — K219 Gastro-esophageal reflux disease without esophagitis: Secondary | ICD-10-CM | POA: Diagnosis present

## 2020-04-06 DIAGNOSIS — J9601 Acute respiratory failure with hypoxia: Secondary | ICD-10-CM | POA: Diagnosis not present

## 2020-04-06 DIAGNOSIS — F0390 Unspecified dementia without behavioral disturbance: Secondary | ICD-10-CM | POA: Diagnosis present

## 2020-04-06 DIAGNOSIS — Z823 Family history of stroke: Secondary | ICD-10-CM

## 2020-04-06 DIAGNOSIS — E785 Hyperlipidemia, unspecified: Secondary | ICD-10-CM | POA: Diagnosis present

## 2020-04-06 DIAGNOSIS — I69322 Dysarthria following cerebral infarction: Secondary | ICD-10-CM | POA: Diagnosis not present

## 2020-04-06 DIAGNOSIS — Z6841 Body Mass Index (BMI) 40.0 and over, adult: Secondary | ICD-10-CM

## 2020-04-06 DIAGNOSIS — R7301 Impaired fasting glucose: Secondary | ICD-10-CM | POA: Diagnosis not present

## 2020-04-06 DIAGNOSIS — R0689 Other abnormalities of breathing: Secondary | ICD-10-CM | POA: Diagnosis not present

## 2020-04-06 DIAGNOSIS — Z86010 Personal history of colonic polyps: Secondary | ICD-10-CM

## 2020-04-06 DIAGNOSIS — L97811 Non-pressure chronic ulcer of other part of right lower leg limited to breakdown of skin: Secondary | ICD-10-CM | POA: Diagnosis not present

## 2020-04-06 DIAGNOSIS — I252 Old myocardial infarction: Secondary | ICD-10-CM

## 2020-04-06 DIAGNOSIS — I87332 Chronic venous hypertension (idiopathic) with ulcer and inflammation of left lower extremity: Secondary | ICD-10-CM | POA: Diagnosis not present

## 2020-04-06 LAB — CBC WITH DIFFERENTIAL/PLATELET
Abs Immature Granulocytes: 0.05 10*3/uL (ref 0.00–0.07)
Basophils Absolute: 0.1 10*3/uL (ref 0.0–0.1)
Basophils Relative: 1 %
Eosinophils Absolute: 0.1 10*3/uL (ref 0.0–0.5)
Eosinophils Relative: 2 %
HCT: 38.9 % — ABNORMAL LOW (ref 39.0–52.0)
Hemoglobin: 11.8 g/dL — ABNORMAL LOW (ref 13.0–17.0)
Immature Granulocytes: 1 %
Lymphocytes Relative: 7 %
Lymphs Abs: 0.6 10*3/uL — ABNORMAL LOW (ref 0.7–4.0)
MCH: 26.1 pg (ref 26.0–34.0)
MCHC: 30.3 g/dL (ref 30.0–36.0)
MCV: 86.1 fL (ref 80.0–100.0)
Monocytes Absolute: 0.5 10*3/uL (ref 0.1–1.0)
Monocytes Relative: 6 %
Neutro Abs: 6.6 10*3/uL (ref 1.7–7.7)
Neutrophils Relative %: 83 %
Platelets: 251 10*3/uL (ref 150–400)
RBC: 4.52 MIL/uL (ref 4.22–5.81)
RDW: 18.3 % — ABNORMAL HIGH (ref 11.5–15.5)
WBC: 7.8 10*3/uL (ref 4.0–10.5)
nRBC: 0 % (ref 0.0–0.2)

## 2020-04-06 LAB — COMPREHENSIVE METABOLIC PANEL
ALT: 17 U/L (ref 0–44)
AST: 26 U/L (ref 15–41)
Albumin: 3.5 g/dL (ref 3.5–5.0)
Alkaline Phosphatase: 80 U/L (ref 38–126)
Anion gap: 10 (ref 5–15)
BUN: 39 mg/dL — ABNORMAL HIGH (ref 8–23)
CO2: 25 mmol/L (ref 22–32)
Calcium: 9 mg/dL (ref 8.9–10.3)
Chloride: 103 mmol/L (ref 98–111)
Creatinine, Ser: 2.01 mg/dL — ABNORMAL HIGH (ref 0.61–1.24)
GFR, Estimated: 33 mL/min — ABNORMAL LOW (ref >60)
Glucose, Bld: 131 mg/dL — ABNORMAL HIGH (ref 70–99)
Potassium: 3.9 mmol/L (ref 3.5–5.1)
Sodium: 138 mmol/L (ref 135–145)
Total Bilirubin: 0.7 mg/dL (ref 0.3–1.2)
Total Protein: 7 g/dL (ref 6.5–8.1)

## 2020-04-06 LAB — TROPONIN I (HIGH SENSITIVITY)
Troponin I (High Sensitivity): 9 ng/L (ref <18)
Troponin I (High Sensitivity): 8 ng/L (ref <18)

## 2020-04-06 LAB — BRAIN NATRIURETIC PEPTIDE: B Natriuretic Peptide: 66 pg/mL (ref 0.0–100.0)

## 2020-04-06 NOTE — ED Provider Notes (Signed)
[facility] EMERGENCY DEPARTMENT Provider Note   CSN: [id number] Arrival date & time: 04/06/20  1754     History Chief Complaint  Patient presents with  . Shortness of Breath    [name] is a 80 y.o. male.  HPI Level 5 caveat due to some dementia. Patient reportedly brought in for home for hypoxia and difficulty breathing.  Unknown saturations at home but reportedly was hypoxic.  Started on nonrebreather due to mouth breathing.  Has been given steroids albuterol and breathing treatment.  Has had recent admission the hospital for CHF.  History of COPD.  Patient really cannot provide much history.  States he is feeling somewhat short of breath.  Patient's weight and October at cardiology visit was 129 kg.    Past Medical History:  Diagnosis Date  . Anxiety   . Atrial fibrillation ([place])   . Bradycardia   . Carotid artery occlusion   . Cataract    bilataeral cateracts removed  . CKD (chronic kidney disease), stage III ([place])   . Colon polyps   . Common bile duct stone   . COPD (chronic obstructive pulmonary disease) ([place])   . 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 CVA (cerebral vascular accident) ([place])   . Depression   . DVT (deep venous thrombosis) ([place])   . Dyslipidemia   . Essential hypertension   . Foot pain   . GERD (gastroesophageal reflux disease)   . History of benign prostatic hypertrophy   . History of leukocytosis   . Hypertension   . MI, old   . Neuromuscular disorder (HCC)    neuropathy  . Neuropathy   . Peripheral vascular disease ([place])   . Renal failure, chronic    stage 111  . Tobacco abuse     Patient Active Problem List   Diagnosis Date Noted  . CAP (community acquired pneumonia) 02/25/2019  . Rhabdomyolysis 02/25/2019  . Chest pain, rule out acute myocardial infarction   . Chest pain 03/21/2015  . Dysphagia 03/21/2015  . CKD (chronic kidney disease), stage III ([place])   . CN  (constipation) 11/13/2014  . Dysphagia, pharyngoesophageal phase 11/13/2014  . Coronary artery disease involving native coronary artery of native heart without angina pectoris 07/31/2014  . Chronic atrial fibrillation ([place]) 07/31/2014  . Adenomatous colon polyp 03/01/2014  . Swelling of limb- Right Leg > Left leg 09/13/2013  . Carotid stenosis 09/08/2012  . HLD (hyperlipidemia) 09/03/2008  . LEUKOCYTOSIS 09/03/2008  . Anxiety state 09/03/2008  . TOBACCO ABUSE 09/03/2008  . NEUROPATHY 09/03/2008  . Essential hypertension 09/03/2008  . Coronary atherosclerosis 09/03/2008  . BRADYCARDIA 09/03/2008  . CVA 09/03/2008  . GASTROESOPHAGEAL REFLUX DISEASE 09/03/2008  . BPH (benign prostatic hyperplasia) 09/03/2008    Past Surgical History:  Procedure Laterality Date  . ANKLE SURGERY    . BYPASS GRAFT  2009 or  2010   stent  . CHOLECYSTECTOMY     Gall Bladder  . COLONOSCOPY    . intervention of the RCA     using a single BMS       Family History  Problem Relation Age of Onset  . Stroke Mother   . Hypertension Mother   . Heart attack Mother   . Heart attack Brother   . Cancer Brother   . Hypertension Father   . Heart attack Father   . CVA Father   . Heart disease Brother   . Hypertension Brother   .  AAA (abdominal aortic aneurysm) Brother   . Heart disease Brother   . Colon cancer Neg Hx   . Esophageal cancer Neg Hx   . Rectal cancer Neg Hx   . Stomach cancer Neg Hx     Social History   Tobacco Use  . Smoking status: Former Smoker    Packs/day: 2.00    Years: 63.00    Pack years: 126.00    Types: Cigarettes    Quit date: 01/2018    Years since quitting: 2.2  . Smokeless tobacco: Never Used  Vaping Use  . Vaping Use: Never used  Substance Use Topics  . Alcohol use: No    Alcohol/week: 0.0 standard drinks  . Drug use: No    Home Medications Prior to Admission medications   Medication Sig Start Date End Date Taking? Authorizing Provider  albuterol  (PROVENTIL) (2.5 MG/3ML) 0.083% nebulizer solution Take 3 mLs (2.5 mg total) by nebulization every 6 (six) hours as needed for wheezing or shortness of breath. 12/29/19   Olalere, Cicero Duck A, MD  albuterol (VENTOLIN HFA) 108 (90 Base) MCG/ACT inhaler Inhale into the lungs. 12/18/19   [provider]  ANORO ELLIPTA 62.5-25 MCG/INH AEPB 1 puff daily. 11/28/19   [provider]  apixaban (ELIQUIS) 2.5 MG TABS tablet Take 1 tablet (2.5 mg total) by mouth 2 (two) times daily. 12/29/18   Bhagat, Crista Luria, PA  diazepam (VALIUM) 2 MG tablet Take 1 tablet (2 mg total) by mouth every 12 (twelve) hours as needed for anxiety. 03/03/19   Domenic Polite, MD  finasteride (PROSCAR) 5 MG tablet Take 5 mg by mouth 2 (two) times daily.  08/17/12   [provider]  furosemide (LASIX) 40 MG tablet Take 2 tablets (80 mg total) by mouth 2 (two) times daily. 12/06/19 12/05/20  Richardson Dopp T, PA-C  HYDROcodone-acetaminophen (NORCO/VICODIN) 5-325 MG tablet Take 1 tablet by mouth 2 (two) times daily as needed for moderate pain. 03/03/19   Domenic Polite, MD  isosorbide mononitrate (IMDUR) 60 MG 24 hr tablet TAKE 1 TABLET BY MOUTH EVERY DAY 02/14/20   Sherren Mocha, MD  KLOR-CON M10 10 MEQ tablet Take 20 mEq by mouth daily. 04/26/19   [provider]  levothyroxine (SYNTHROID) 50 MCG tablet Take 50 mcg by mouth daily. 11/17/19   [provider]  metoprolol tartrate (LOPRESSOR) 25 MG tablet Take 25 mg by mouth 2 (two) times daily.    [provider]  mirtazapine (REMERON) 15 MG tablet Take 7.5 mg by mouth daily. 05/31/19   [provider]  nitroGLYCERIN (NITROSTAT) 0.4 MG SL tablet Place 1 tablet (0.4 mg total) under the tongue every 5 (five) minutes as needed for chest pain. 12/26/18   Bhagat, Crista Luria, PA  omeprazole (PRILOSEC) 40 MG capsule Take 40 mg by mouth daily. 11/28/19   [provider]  rosuvastatin (CRESTOR) 20 MG tablet Take 20 mg by mouth at  bedtime. 04/26/19   [provider]  sertraline (ZOLOFT) 100 MG tablet Take 100 mg by mouth 2 (two) times daily.  03/13/15   [provider]  tamsulosin (FLOMAX) 0.4 MG CAPS capsule Take 0.4 mg by mouth daily. 04/26/19   [provider]    Allergies    Hctz [hydrochlorothiazide], Amitriptyline, Gabapentin, Metoprolol tartrate, Paroxetine, and Spiriva handihaler [tiotropium bromide monohydrate]  Review of Systems   Review of Systems  Unable to perform ROS: Dementia    Physical Exam Updated Vital Signs BP (!) 133/50   Pulse 87  Temp 97.7 F (36.5 C) (Oral)   Resp 20   Ht 5\' 10"  (1.778 m)   Wt (!) 137.9 kg   SpO2 93%   BMI 43.62 kg/m   Physical Exam Vitals and nursing note reviewed.  Constitutional:      Appearance: He is well-developed.  HENT:     Head: Atraumatic.  Cardiovascular:     Rate and Rhythm: Tachycardia present. Rhythm irregular.  Pulmonary:     Comments: Decreased breath sounds throughout. Chest:     Chest wall: No tenderness.  Abdominal:     Tenderness: There is abdominal tenderness.     Comments: Some tenderness over her abdomen with some edema.  Musculoskeletal:     Cervical back: Neck supple.     Right lower leg: Edema present.     Left lower leg: Edema present.     Comments: Moderate to severe pitting edema bilateral lower extremities.  Skin:    General: Skin is warm.     Capillary Refill: Capillary refill takes less than 2 seconds.     Comments: Yeastlike redness under breasts bilaterally.  Neurological:     Comments: Awake.  Really cannot provide much history.     ED Results / Procedures / Treatments   Labs (all labs ordered are listed, but only abnormal results are displayed) Labs Reviewed  COMPREHENSIVE METABOLIC PANEL - Abnormal; Notable for the following components:      Result Value   Glucose, Bld 131 (*)    BUN 39 (*)    Creatinine, Ser 2.01 (*)    GFR, Estimated 33 (*)    All other components within normal  limits  CBC WITH DIFFERENTIAL/PLATELET - Abnormal; Notable for the following components:   Hemoglobin 11.8 (*)    HCT 38.9 (*)    RDW 18.3 (*)    Lymphs Abs 0.6 (*)    All other components within normal limits  RESP PANEL BY RT-PCR (FLU A&B, COVID) ARPGX2  BRAIN NATRIURETIC PEPTIDE  TROPONIN I (HIGH SENSITIVITY)  TROPONIN I (HIGH SENSITIVITY)    EKG EKG Interpretation  Date/Time:  Saturday April 06 2020 18:21:45 EST Ventricular Rate:  112 PR Interval:    QRS Duration: 95 QT Interval:  353 QTC Calculation: 482 R Axis:   64 Text Interpretation: Atrial fibrillation Low voltage, precordial leads Nonspecific T abnormalities, lateral leads Borderline prolonged QT interval afib is old. rate increased Confirmed by Davonna Belling [phone]) on 04/06/2020 6:23:48 PM   Radiology DG Chest Portable 1 View  Result Date: 04/06/2020 CLINICAL DATA:  Progressive shortness of breath for 3 months. EXAM: PORTABLE CHEST 1 VIEW COMPARISON:  12/29/2019 FINDINGS: Stable low lung volumes. Patient is rotated. Stable upper normal heart size. Aortic atherosclerosis and tortuosity. Chronic interstitial coarsening, unchanged. No acute airspace disease. No significant pleural effusion. No pneumothorax. Bones are diffusely under mineralized. IMPRESSION: Stable low lung volumes and chronic interstitial coarsening. No acute abnormality. Aortic Atherosclerosis (ICD10-I70.0). Electronically Signed   By: Keith Rake M.D.   On: 04/06/2020 18:57    Procedures Procedures   Medications Ordered in ED Medications - No data to display  ED Course  I have reviewed the triage vital signs and the nursing notes.  Pertinent labs & imaging results that were available during my care of the patient were reviewed by me and considered in my medical decision making (see chart for details).    MDM Rules/Calculators/A&P  Patient with shortness of breath.  Has had increased weight gain at home.   Weight is up at least 8 kg from previous weight at cardiology.  However chest x-ray reassuring and BNP normal.  Has new oxygen requirement.  Doing better now somewhat after breathing treatments.  Mental status improved.  However with new oxygen requirement I feels the patient would benefit from admission to the hospital.  Covid ordered but has not resulted yet.  Pulmonary embolism felt less likely with patient already being on anticoagulation.  Will admit to hospitalist Final Clinical Impression(s) / ED Diagnoses Final diagnoses:  COPD exacerbation ([place])  Hypoxia    Rx / DC Orders ED Discharge Orders    None       Davonna Belling, MD 04/06/20 2151

## 2020-04-06 NOTE — ED Notes (Signed)
Pt verbalized he had covid vaccine x2.

## 2020-04-06 NOTE — ED Notes (Signed)
Reminded pt to deep breath and not shallow breathing

## 2020-04-06 NOTE — H&P (Signed)
History and Physical  ADRYAN SHIN JKD:[id number] DOB: 1940-11-05 DOA: 04/06/2020  Referring physician: Davonna Belling, MD PCP: Marton Redwood, MD  Patient coming from: Home  Chief Complaint: Shortness of breath  HPI: [name] is a 80 y.o. male with medical history significant forparoxysmal atrial fibrillation on apixaban, history of CAD, COPD, CVA x2, dysphagia, anxiety disorder, carotid artery disease, morbid obesity, GERD, hypertension and morbid obesity department who presents to the emergency department due to shortness of breath and increased work of breathing.  Patient was only able to provide limited history, respiratory history was obtained from ED physician and ED medical record.  Patient states that he has had slowly and progressive shortness of breath within last 3 months and that the shortness of breath worsened within last several days.  He states that he ambulates with a Levene and easily gets short of breath after a few steps within last few days.  Apparently, EMS was activated and on arrival of EMS, patient was started on a nonrebreather due to hypoxia .  Steroids and albuterol breathing treatment was provided in route.  He denies chest pain, fever, chills, nausea or vomiting.  ED Course:  In the emergency department, he was initially tachypneic and intermittently tachycardic.  O2 sat was 89% on room air, supplemental oxygen via [place] at 3 LPM was provided with improvement in O2 sat to 92 to 95%.  Work-up in the ED showed normocytic anemia, BUN/creatinine 39/2.0 (baseline creatinine at 1.5-1.6).  Troponin x2- 9>8. Chest x-ray showed no acute abnormality Hospitalist was asked to admit patient for further evaluation and management.   Review of Systems: Constitutional: Negative for chills and fever.  HENT: Negative for ear pain and sore throat.   Eyes: Negative for pain and visual disturbance.  Respiratory: Positive for shortness of breath  Cardiovascular: Negative for  chest pain and palpitations.  Gastrointestinal: Negative for abdominal pain and vomiting.  Endocrine: Negative for polyphagia and polyuria.  Genitourinary: Negative for decreased urine volume, dysuria, enuresis Musculoskeletal: Negative for arthralgias and back pain.  Skin: Negative for color change and rash.  Allergic/Immunologic: Negative for immunocompromised state.  Neurological: Negative for tremors, syncope, speech difficulty Hematological: Does not bruise/bleed easily.  All other systems reviewed and are negative    Past Medical History:  Diagnosis Date  . Anxiety   . Atrial fibrillation ([place])   . Bradycardia   . Carotid artery occlusion   . Cataract    bilataeral cateracts removed  . CKD (chronic kidney disease), stage III ([place])   . Colon polyps   . Common bile duct stone   . COPD (chronic obstructive pulmonary disease) ([place])   . 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 CVA (cerebral vascular accident) ([place])   . Depression   . DVT (deep venous thrombosis) ([place])   . Dyslipidemia   . Essential hypertension   . Foot pain   . GERD (gastroesophageal reflux disease)   . History of benign prostatic hypertrophy   . History of leukocytosis   . Hypertension   . MI, old   . Neuromuscular disorder (HCC)    neuropathy  . Neuropathy   . Peripheral vascular disease ([place])   . Renal failure, chronic    stage 111  . Tobacco abuse    Past Surgical History:  Procedure Laterality Date  . ANKLE SURGERY    . BYPASS GRAFT  2009 or  2010   stent  . CHOLECYSTECTOMY  Gall Bladder  . COLONOSCOPY    . intervention of the RCA     using a single BMS    Social History:  reports that he quit smoking about 2 years ago. His smoking use included cigarettes. He has a 126.00 pack-year smoking history. He has never used smokeless tobacco. He reports that he does not drink alcohol and does not use drugs.   Allergies  Allergen Reactions  .  Hctz [Hydrochlorothiazide] Anaphylaxis and Other (See Comments)    Throat Swelling  . Amitriptyline Other (See Comments)    Made me go crazy  . Gabapentin Other (See Comments)    Made me go crazy  . Metoprolol Tartrate Other (See Comments)    fatigue  . Paroxetine Other (See Comments)    Made me go crazy  . Spiriva Handihaler [Tiotropium Bromide Monohydrate] Other (See Comments)    Urinary retention    Family History  Problem Relation Age of Onset  . Stroke Mother   . Hypertension Mother   . Heart attack Mother   . Heart attack Brother   . Cancer Brother   . Hypertension Father   . Heart attack Father   . CVA Father   . Heart disease Brother   . Hypertension Brother   . AAA (abdominal aortic aneurysm) Brother   . Heart disease Brother   . Colon cancer Neg Hx   . Esophageal cancer Neg Hx   . Rectal cancer Neg Hx   . Stomach cancer Neg Hx     Prior to Admission medications   Medication Sig Start Date End Date Taking? Authorizing Provider  albuterol (PROVENTIL) (2.5 MG/3ML) 0.083% nebulizer solution Take 3 mLs (2.5 mg total) by nebulization every 6 (six) hours as needed for wheezing or shortness of breath. 12/29/19   Olalere, Cicero Duck A, MD  albuterol (VENTOLIN HFA) 108 (90 Base) MCG/ACT inhaler Inhale into the lungs. 12/18/19   [provider]  ANORO ELLIPTA 62.5-25 MCG/INH AEPB 1 puff daily. 11/28/19   [provider]  apixaban (ELIQUIS) 2.5 MG TABS tablet Take 1 tablet (2.5 mg total) by mouth 2 (two) times daily. 12/29/18   Bhagat, Crista Luria, PA  diazepam (VALIUM) 2 MG tablet Take 1 tablet (2 mg total) by mouth every 12 (twelve) hours as needed for anxiety. 03/03/19   Domenic Polite, MD  finasteride (PROSCAR) 5 MG tablet Take 5 mg by mouth 2 (two) times daily.  08/17/12   [provider]  furosemide (LASIX) 40 MG tablet Take 2 tablets (80 mg total) by mouth 2 (two) times daily. 12/06/19 12/05/20  Richardson Dopp T, PA-C  HYDROcodone-acetaminophen  (NORCO/VICODIN) 5-325 MG tablet Take 1 tablet by mouth 2 (two) times daily as needed for moderate pain. 03/03/19   Domenic Polite, MD  isosorbide mononitrate (IMDUR) 60 MG 24 hr tablet TAKE 1 TABLET BY MOUTH EVERY DAY 02/14/20   Sherren Mocha, MD  KLOR-CON M10 10 MEQ tablet Take 20 mEq by mouth daily. 04/26/19   [provider]  levothyroxine (SYNTHROID) 50 MCG tablet Take 50 mcg by mouth daily. 11/17/19   [provider]  metoprolol tartrate (LOPRESSOR) 25 MG tablet Take 25 mg by mouth 2 (two) times daily.    [provider]  mirtazapine (REMERON) 15 MG tablet Take 7.5 mg by mouth daily. 05/31/19   [provider]  nitroGLYCERIN (NITROSTAT) 0.4 MG SL tablet Place 1 tablet (0.4 mg total) under the tongue every 5 (five) minutes as needed for chest pain. 12/26/18  Bhagat, Bhavinkumar, PA  omeprazole (PRILOSEC) 40 MG capsule Take 40 mg by mouth daily. 11/28/19   [provider]  rosuvastatin (CRESTOR) 20 MG tablet Take 20 mg by mouth at bedtime. 04/26/19   [provider]  sertraline (ZOLOFT) 100 MG tablet Take 100 mg by mouth 2 (two) times daily.  03/13/15   [provider]  tamsulosin (FLOMAX) 0.4 MG CAPS capsule Take 0.4 mg by mouth daily. 04/26/19   [provider]    Physical Exam: BP (!) 149/60   Pulse (!) 111   Temp 97.7 F (36.5 C) (Oral)   Resp 20   Ht 5\' 10"  (1.778 m)   Wt (!) 137.9 kg   SpO2 95%   BMI 43.62 kg/m   . General: 80 y.o. year-old male well developed well nourished in no acute distress.  Alert and oriented x3. Marland Kitchen HEENT: NCAT, EOMI . Neck: Supple, trachea medial . Cardiovascular: Tachycardia, irregular rate and rhythm with no rubs or gallops.  2/4 pulses in all 4 extremities. Marland Kitchen Respiratory: Coarse breath sounds which appears to be around neck and decreased breath sounds in lung area.   . Abdomen: Soft nontender with normal bowel sounds x4 quadrants. . Muskuloskeletal: +2 bilateral lower extremity edema  wrapped with self-adhesive bandage.  No cyanosis or clubbing noted bilaterally . Neuro: CN II-XII intact, strength, sensation, reflexes . Skin: No ulcerative lesions noted or rashes . Psychiatry: Judgement and insight appear normal. Mood is appropriate for condition and setting          Labs on Admission:  Basic Metabolic Panel: Recent Labs  Lab 04/06/20 1828  NA 138  K 3.9  CL 103  CO2 25  GLUCOSE 131*  BUN 39*  CREATININE 2.01*  CALCIUM 9.0   Liver Function Tests: Recent Labs  Lab 04/06/20 1828  AST 26  ALT 17  ALKPHOS 80  BILITOT 0.7  PROT 7.0  ALBUMIN 3.5   No results for input(s): LIPASE, AMYLASE in the last 168 hours. No results for input(s): AMMONIA in the last 168 hours. CBC: Recent Labs  Lab 04/06/20 1828  WBC 7.8  NEUTROABS 6.6  HGB 11.8*  HCT 38.9*  MCV 86.1  PLT 251   Cardiac Enzymes: No results for input(s): CKTOTAL, CKMB, CKMBINDEX, TROPONINI in the last 168 hours.  BNP (last 3 results) Recent Labs    04/06/20 1829  BNP 66.0    ProBNP (last 3 results) Recent Labs    12/04/19 1015  PROBNP 1,167*    CBG: No results for input(s): GLUCAP in the last 168 hours.  Radiological Exams on Admission: DG Chest Portable 1 View  Result Date: 04/06/2020 CLINICAL DATA:  Progressive shortness of breath for 3 months. EXAM: PORTABLE CHEST 1 VIEW COMPARISON:  12/29/2019 FINDINGS: Stable low lung volumes. Patient is rotated. Stable upper normal heart size. Aortic atherosclerosis and tortuosity. Chronic interstitial coarsening, unchanged. No acute airspace disease. No significant pleural effusion. No pneumothorax. Bones are diffusely under mineralized. IMPRESSION: Stable low lung volumes and chronic interstitial coarsening. No acute abnormality. Aortic Atherosclerosis (ICD10-I70.0). Electronically Signed   By: Keith Rake M.D.   On: 04/06/2020 18:57    EKG: I independently viewed the EKG done and my findings are as followed: A. fib with RVR and  nonspecific T wave abnormalities  Assessment/Plan Present on Admission: . Essential hypertension . HLD (hyperlipidemia) . GASTROESOPHAGEAL REFLUX DISEASE . Coronary artery disease involving native coronary artery of native heart without angina pectoris . Chronic atrial fibrillation (HCC)  Principal Problem:   COPD with acute bronchitis ([place]) Active Problems:   HLD (hyperlipidemia)   Essential hypertension   GASTROESOPHAGEAL REFLUX DISEASE   Coronary artery disease involving native coronary artery of native heart without angina pectoris   Chronic atrial fibrillation (HCC)   Acute respiratory failure with hypoxia (HCC)   Candidal intertrigo   History of stroke   Chronic systolic CHF (congestive heart failure) (HCC)  Acute respiratory failure with hypoxia possibly secondary to COPD with acute bronchitis  Continue albuterol, Mucinex, Solu-Medrol. Continue Protonix to prevent steroid-induced ulcer Continue incentive spirometry and flutter valve Continue supplemental oxygen to maintain O2 sat > 92% with plan to wean patient off oxygen as tolerated  Candidal intertrigo Patient presents with intertrigo in the abdominal and groin area Continue nystatin powder  Borderline prolonged QTc (481 ms) Avoid QT prolonging drugs Magnesium level will be checked  Hypothyroidism Continue Synthroid  Chronic CHF BNP 66, this was 1853 about 9 months ago Patient presents with bilateral leg swelling Continue Lasix per home regimen (monitor for worsening creatinine) Continue metoprolol per home regimen Cardiogram done on 06/26/2019 showed LVEF of 45 to 50% with LV demonstrating regional wall motion abnormalities, severe hypokinesis of the basal to mid inferior wall and the basal inferoseptal wall  Acute kidney injury BUN/creatinine 39/2.0 (baseline creatinine at 1.5-1.6), creatinine was 2.12 about 4 months ago Renally adjust medications, avoid nephrotoxic agents/dehydration/hypotension  Chronic  atrial fibrillation/history of stroke Continue metoprolol and apixaban  CAD Continue metoprolol  Essential hypertension Continue Lasix and metoprolol  Hyperlipidemia Continue Crestor   DVT prophylaxis: Eliquis  Code Status: Full code  Family Communication: None at bedside  Disposition Plan:  Patient is from:                        home Anticipated DC to:                   SNF or family members home Anticipated DC date:               2-3 days Anticipated DC barriers:           Patient requires inpatient management due to respiratory failure with hypoxia requiring supplemental oxygen   Consults called: None  Admission status: Inpatient    Bernadette Hoit MD [facility]  04/07/2020, 1:10 AM

## 2020-04-06 NOTE — ED Notes (Signed)
EDP assess.

## 2020-04-06 NOTE — ED Triage Notes (Signed)
Pt presented to ED for SOB. Pt came with EMS from home. EMS applied nonrebreather at 10 lpm due to mouth breathing. EMS gave solumedrol, neb tx, and inhaler. Pt stated abdomin has increased with fluid x 5 days. Medication list at bedside. Bilateral legs dressed in una boots.

## 2020-04-06 NOTE — ED Notes (Signed)
Patient desat to 82% on room air nasal cannula placed

## 2020-04-07 DIAGNOSIS — D649 Anemia, unspecified: Secondary | ICD-10-CM | POA: Diagnosis present

## 2020-04-07 DIAGNOSIS — J209 Acute bronchitis, unspecified: Secondary | ICD-10-CM

## 2020-04-07 DIAGNOSIS — Z87891 Personal history of nicotine dependence: Secondary | ICD-10-CM | POA: Diagnosis not present

## 2020-04-07 DIAGNOSIS — E785 Hyperlipidemia, unspecified: Secondary | ICD-10-CM | POA: Diagnosis present

## 2020-04-07 DIAGNOSIS — Z8673 Personal history of transient ischemic attack (TIA), and cerebral infarction without residual deficits: Secondary | ICD-10-CM | POA: Diagnosis not present

## 2020-04-07 DIAGNOSIS — I130 Hypertensive heart and chronic kidney disease with heart failure and stage 1 through stage 4 chronic kidney disease, or unspecified chronic kidney disease: Secondary | ICD-10-CM | POA: Diagnosis present

## 2020-04-07 DIAGNOSIS — N179 Acute kidney failure, unspecified: Secondary | ICD-10-CM | POA: Diagnosis present

## 2020-04-07 DIAGNOSIS — R9431 Abnormal electrocardiogram [ECG] [EKG]: Secondary | ICD-10-CM

## 2020-04-07 DIAGNOSIS — B372 Candidiasis of skin and nail: Secondary | ICD-10-CM

## 2020-04-07 DIAGNOSIS — I2510 Atherosclerotic heart disease of native coronary artery without angina pectoris: Secondary | ICD-10-CM | POA: Diagnosis present

## 2020-04-07 DIAGNOSIS — E039 Hypothyroidism, unspecified: Secondary | ICD-10-CM

## 2020-04-07 DIAGNOSIS — F0390 Unspecified dementia without behavioral disturbance: Secondary | ICD-10-CM | POA: Diagnosis present

## 2020-04-07 DIAGNOSIS — N1832 Chronic kidney disease, stage 3b: Secondary | ICD-10-CM | POA: Diagnosis present

## 2020-04-07 DIAGNOSIS — J440 Chronic obstructive pulmonary disease with acute lower respiratory infection: Secondary | ICD-10-CM

## 2020-04-07 DIAGNOSIS — Z66 Do not resuscitate: Secondary | ICD-10-CM | POA: Diagnosis present

## 2020-04-07 DIAGNOSIS — Z823 Family history of stroke: Secondary | ICD-10-CM | POA: Diagnosis not present

## 2020-04-07 DIAGNOSIS — K219 Gastro-esophageal reflux disease without esophagitis: Secondary | ICD-10-CM | POA: Diagnosis present

## 2020-04-07 DIAGNOSIS — R0600 Dyspnea, unspecified: Secondary | ICD-10-CM | POA: Diagnosis not present

## 2020-04-07 DIAGNOSIS — Z6841 Body Mass Index (BMI) 40.0 and over, adult: Secondary | ICD-10-CM | POA: Diagnosis not present

## 2020-04-07 DIAGNOSIS — I482 Chronic atrial fibrillation, unspecified: Secondary | ICD-10-CM | POA: Diagnosis present

## 2020-04-07 DIAGNOSIS — R000 Tachycardia, unspecified: Secondary | ICD-10-CM | POA: Diagnosis present

## 2020-04-07 DIAGNOSIS — J9601 Acute respiratory failure with hypoxia: Secondary | ICD-10-CM | POA: Diagnosis present

## 2020-04-07 DIAGNOSIS — Z888 Allergy status to other drugs, medicaments and biological substances status: Secondary | ICD-10-CM | POA: Diagnosis not present

## 2020-04-07 DIAGNOSIS — R0602 Shortness of breath: Secondary | ICD-10-CM | POA: Diagnosis not present

## 2020-04-07 DIAGNOSIS — I5043 Acute on chronic combined systolic (congestive) and diastolic (congestive) heart failure: Secondary | ICD-10-CM | POA: Diagnosis present

## 2020-04-07 DIAGNOSIS — I5022 Chronic systolic (congestive) heart failure: Secondary | ICD-10-CM | POA: Diagnosis not present

## 2020-04-07 DIAGNOSIS — Z20822 Contact with and (suspected) exposure to covid-19: Secondary | ICD-10-CM | POA: Diagnosis present

## 2020-04-07 LAB — COMPREHENSIVE METABOLIC PANEL
ALT: 19 U/L (ref 0–44)
AST: 28 U/L (ref 15–41)
Albumin: 3.3 g/dL — ABNORMAL LOW (ref 3.5–5.0)
Alkaline Phosphatase: 73 U/L (ref 38–126)
Anion gap: 11 (ref 5–15)
BUN: 36 mg/dL — ABNORMAL HIGH (ref 8–23)
CO2: 24 mmol/L (ref 22–32)
Calcium: 9.6 mg/dL (ref 8.9–10.3)
Chloride: 104 mmol/L (ref 98–111)
Creatinine, Ser: 1.82 mg/dL — ABNORMAL HIGH (ref 0.61–1.24)
GFR, Estimated: 37 mL/min — ABNORMAL LOW (ref >60)
Glucose, Bld: 156 mg/dL — ABNORMAL HIGH (ref 70–99)
Potassium: 4.2 mmol/L (ref 3.5–5.1)
Sodium: 139 mmol/L (ref 135–145)
Total Bilirubin: 0.7 mg/dL (ref 0.3–1.2)
Total Protein: 7.2 g/dL (ref 6.5–8.1)

## 2020-04-07 LAB — CBC
HCT: 39 % (ref 39.0–52.0)
Hemoglobin: 11.9 g/dL — ABNORMAL LOW (ref 13.0–17.0)
MCH: 25.9 pg — ABNORMAL LOW (ref 26.0–34.0)
MCHC: 30.5 g/dL (ref 30.0–36.0)
MCV: 84.8 fL (ref 80.0–100.0)
Platelets: 250 10*3/uL (ref 150–400)
RBC: 4.6 MIL/uL (ref 4.22–5.81)
RDW: 18.3 % — ABNORMAL HIGH (ref 11.5–15.5)
WBC: 11.7 10*3/uL — ABNORMAL HIGH (ref 4.0–10.5)
nRBC: 0 % (ref 0.0–0.2)

## 2020-04-07 LAB — RESP PANEL BY RT-PCR (FLU A&B, COVID) ARPGX2
Influenza A by PCR: NEGATIVE
Influenza B by PCR: NEGATIVE
SARS Coronavirus 2 by RT PCR: NEGATIVE

## 2020-04-07 LAB — PHOSPHORUS: Phosphorus: 2.6 mg/dL (ref 2.5–4.6)

## 2020-04-07 LAB — PROTIME-INR
INR: 1.2 (ref 0.8–1.2)
Prothrombin Time: 14.4 seconds (ref 11.4–15.2)

## 2020-04-07 LAB — APTT: aPTT: 31 seconds (ref 24–36)

## 2020-04-07 LAB — MAGNESIUM: Magnesium: 2.5 mg/dL — ABNORMAL HIGH (ref 1.7–2.4)

## 2020-04-07 MED ORDER — IPRATROPIUM BROMIDE 0.02 % IN SOLN
RESPIRATORY_TRACT | Status: AC
  Filled 2020-04-07: qty 2.5

## 2020-04-07 MED ORDER — ALBUTEROL SULFATE (2.5 MG/3ML) 0.083% IN NEBU
2.5000 mg | INHALATION_SOLUTION | Freq: Three times a day (TID) | RESPIRATORY_TRACT | Status: DC
  Administered 2020-04-08: 2.5 mg via RESPIRATORY_TRACT
  Filled 2020-04-07: qty 3

## 2020-04-07 MED ORDER — FUROSEMIDE 80 MG PO TABS
80.0000 mg | ORAL_TABLET | Freq: Two times a day (BID) | ORAL | Status: DC
Start: 2020-04-07 — End: 2020-04-09
  Administered 2020-04-07 – 2020-04-08 (×4): 80 mg via ORAL
  Filled 2020-04-07 (×3): qty 1
  Filled 2020-04-07 (×2): qty 2

## 2020-04-07 MED ORDER — LEVOTHYROXINE SODIUM 50 MCG PO TABS
50.0000 ug | ORAL_TABLET | Freq: Every day | ORAL | Status: DC
  Administered 2020-04-07 – 2020-04-10 (×4): 50 ug via ORAL
  Filled 2020-04-07 (×4): qty 1

## 2020-04-07 MED ORDER — NYSTATIN 100000 UNIT/GM EX POWD
Freq: Two times a day (BID) | CUTANEOUS | Status: DC
  Filled 2020-04-07: qty 15

## 2020-04-07 MED ORDER — APIXABAN 2.5 MG PO TABS
2.5000 mg | ORAL_TABLET | Freq: Two times a day (BID) | ORAL | Status: DC
  Administered 2020-04-07 – 2020-04-10 (×7): 2.5 mg via ORAL
  Filled 2020-04-07 (×7): qty 1

## 2020-04-07 MED ORDER — ACETAMINOPHEN 650 MG RE SUPP: 650.0000 mg | Freq: Four times a day (QID) | RECTAL | Status: DC | PRN

## 2020-04-07 MED ORDER — ROSUVASTATIN CALCIUM 20 MG PO TABS
20.0000 mg | ORAL_TABLET | Freq: Every day | ORAL | Status: DC
  Administered 2020-04-07 – 2020-04-09 (×3): 20 mg via ORAL
  Filled 2020-04-07 (×3): qty 1

## 2020-04-07 MED ORDER — DM-GUAIFENESIN ER 30-600 MG PO TB12
1.0000 | ORAL_TABLET | Freq: Two times a day (BID) | ORAL | Status: DC
  Administered 2020-04-07 – 2020-04-10 (×8): 1 via ORAL
  Filled 2020-04-07 (×8): qty 1

## 2020-04-07 MED ORDER — ACETAMINOPHEN 325 MG PO TABS
650.0000 mg | ORAL_TABLET | Freq: Four times a day (QID) | ORAL | Status: DC | PRN
  Administered 2020-04-09: 650 mg via ORAL
  Filled 2020-04-07: qty 2

## 2020-04-07 MED ORDER — METOPROLOL TARTRATE 25 MG PO TABS
25.0000 mg | ORAL_TABLET | Freq: Two times a day (BID) | ORAL | Status: DC
  Administered 2020-04-07 – 2020-04-10 (×7): 25 mg via ORAL
  Filled 2020-04-07 (×7): qty 1

## 2020-04-07 MED ORDER — ALBUTEROL SULFATE (2.5 MG/3ML) 0.083% IN NEBU
INHALATION_SOLUTION | RESPIRATORY_TRACT | Status: AC
  Administered 2020-04-07: 2.5 mg
  Filled 2020-04-07: qty 3

## 2020-04-07 MED ORDER — ALBUTEROL SULFATE (2.5 MG/3ML) 0.083% IN NEBU
2.5000 mg | INHALATION_SOLUTION | Freq: Four times a day (QID) | RESPIRATORY_TRACT | Status: DC
  Administered 2020-04-07 (×3): 2.5 mg via RESPIRATORY_TRACT
  Filled 2020-04-07 (×3): qty 3

## 2020-04-07 MED ORDER — METHYLPREDNISOLONE SODIUM SUCC 40 MG IJ SOLR
40.0000 mg | Freq: Two times a day (BID) | INTRAMUSCULAR | Status: DC
  Administered 2020-04-07 – 2020-04-09 (×5): 40 mg via INTRAVENOUS
  Filled 2020-04-07 (×4): qty 1

## 2020-04-07 MED ORDER — PANTOPRAZOLE SODIUM 40 MG IV SOLR
40.0000 mg | INTRAVENOUS | Status: DC
  Administered 2020-04-07 – 2020-04-10 (×4): 40 mg via INTRAVENOUS
  Filled 2020-04-07 (×4): qty 40

## 2020-04-07 MED ORDER — ALBUTEROL SULFATE (2.5 MG/3ML) 0.083% IN NEBU
2.5000 mg | INHALATION_SOLUTION | RESPIRATORY_TRACT | Status: DC | PRN
  Administered 2020-04-07 – 2020-04-08 (×2): 2.5 mg via RESPIRATORY_TRACT
  Filled 2020-04-07: qty 3

## 2020-04-07 NOTE — Progress Notes (Signed)
PROGRESS NOTE    [name]  ZOX:[id number] DOB: 12-28-1940 DOA: 04/06/2020 PCP: Martha Clan, MD   Brief Narrative:  Per HPI: [name] is a 80 y.o. male with medical history significant forparoxysmal atrial fibrillation on apixaban, history of CAD, COPD, CVA x2, dysphagia, anxiety disorder, carotid artery disease, morbid obesity, GERD, hypertension and morbid obesity department who presents to the emergency department due to shortness of breath and increased work of breathing.  Patient was only able to provide limited history, respiratory history was obtained from ED physician and ED medical record.  Patient states that he has had slowly and progressive shortness of breath within last 3 months and that the shortness of breath worsened within last several days.  He states that he ambulates with a [place] and easily gets short of breath after a few steps within last few days.  Apparently, EMS was activated and on arrival of EMS, patient was started on a nonrebreather due to hypoxia .  Steroids and albuterol breathing treatment was provided in route.  He denies chest pain, fever, chills, nausea or vomiting.   Assessment & Plan:   Principal Problem:   COPD with acute bronchitis (HCC) Active Problems:   HLD (hyperlipidemia)   Essential hypertension   GASTROESOPHAGEAL REFLUX DISEASE   Coronary artery disease involving native coronary artery of native heart without angina pectoris   Chronic atrial fibrillation (HCC)   Acute respiratory failure with hypoxia (HCC)   Candidal intertrigo   History of stroke   Chronic systolic CHF (congestive heart failure) (HCC)   Prolonged QT interval   Hypothyroidism   Acute respiratory failure with hypoxia possibly secondary to COPD with acute bronchitis  Continue albuterol, Mucinex, Solu-Medrol. Continue Protonix to prevent steroid-induced ulcer Continue incentive spirometry and flutter valve Continue supplemental oxygen to maintain O2 sat >  92% with plan to wean patient off oxygen as tolerated  Worsening dyspnea on exertion 2D echocardiogram for further evaluation Prior in 5/21 with LVEF 45-50%; see below  Candidal intertrigo Patient presents with intertrigo in the abdominal and groin area Continue nystatin powder  Borderline prolonged QTc (481 ms) Avoid QT prolonging drugs Magnesium level ok  Hypothyroidism Continue Synthroid  Chronic CHF BNP 66, this was 1853 about 9 months ago Patient presents with bilateral leg swelling Continue Lasix per home regimen (monitor for worsening creatinine) Continue metoprolol per home regimen Cardiogram done on 06/26/2019 showed LVEF of 45 to 50% with LV demonstrating regional wall motion abnormalities, severe hypokinesis of the basal to mid inferior wall and the basal inferoseptal wall  Acute kidney injury-improving BUN/creatinine 36/1.8 (baseline creatinine at 1.5-1.6), creatinine was 2.12 about 4 months ago Renally adjust medications, avoid nephrotoxic agents/dehydration/hypotension  Chronic atrial fibrillation/history of stroke Continue metoprolol and apixaban  CAD Continue metoprolol  Essential hypertension Continue metoprolol and oral Lasix  Hyperlipidemia Continue Crestor  Morbid obesity BMI 43.62 kg/m2   DVT prophylaxis:Eliquis Code Status: DNR Family Communication: Discussed with daughter on 2/13  Disposition Plan:  Status is: Inpatient  Remains inpatient appropriate because:Ongoing diagnostic testing needed not appropriate for outpatient work up, IV treatments appropriate due to intensity of illness or inability to take PO and Inpatient level of care appropriate due to severity of illness   Dispo: The patient is from: Home              Anticipated d/c is to: SNF              Anticipated d/c date is: 3 days  Patient currently is not medically stable to d/c.   Difficult to place patient No   Consultants:   None  Procedures:    See below  Antimicrobials:  Anti-infectives (From admission, onward)   None       Subjective: Patient seen and evaluated today with no new acute complaints or concerns. No acute concerns or events noted overnight. Minimal dyspnea, but no chest pain.  Objective: Vitals:   04/07/20 0500 04/07/20 0530 04/07/20 0600 04/07/20 0852  BP: 128/60 137/70 (!) 107/52   Pulse: (!) 103 95 91   Resp:   (!) 22   Temp:      TempSrc:      SpO2: 92% 93% 94% 96%  Weight:      Height:       No intake or output data in the 24 hours ending 04/07/20 1113 Filed Weights   04/06/20 1805  Weight: (!) 137.9 kg    Examination:  General exam: Appears calm and comfortable, obese Respiratory system: Clear to auscultation. Respiratory effort normal. Currently on 3L [place]. Cardiovascular system: S1 & S2 heard, RRR.  Gastrointestinal system: Abdomen is soft Central nervous system: Alert and awake Extremities: Scant edema Skin: No significant lesions noted Psychiatry: Flat affect.    Data Reviewed: I have personally reviewed following labs and imaging studies  CBC: Recent Labs  Lab 04/06/20 1828 04/07/20 0940  WBC 7.8 11.7*  NEUTROABS 6.6  --   HGB 11.8* 11.9*  HCT 38.9* 39.0  MCV 86.1 84.8  PLT 251 250   Basic Metabolic Panel: Recent Labs  Lab 04/06/20 1828 04/07/20 0940  NA 138 139  K 3.9 4.2  CL 103 104  CO2 25 24  GLUCOSE 131* 156*  BUN 39* 36*  CREATININE 2.01* 1.82*  CALCIUM 9.0 9.6  MG  --  2.5*  PHOS  --  2.6   GFR: Estimated Creatinine Clearance: 44.6 mL/min (A) (by C-G formula based on SCr of 1.82 mg/dL (H)). Liver Function Tests: Recent Labs  Lab 04/06/20 1828 04/07/20 0940  AST 26 28  ALT 17 19  ALKPHOS 80 73  BILITOT 0.7 0.7  PROT 7.0 7.2  ALBUMIN 3.5 3.3*   No results for input(s): LIPASE, AMYLASE in the last 168 hours. No results for input(s): AMMONIA in the last 168 hours. Coagulation Profile: Recent Labs  Lab 04/07/20 0940  INR 1.2   Cardiac  Enzymes: No results for input(s): CKTOTAL, CKMB, CKMBINDEX, TROPONINI in the last 168 hours. BNP (last 3 results) Recent Labs    12/04/19 1015  PROBNP 1,167*   HbA1C: No results for input(s): HGBA1C in the last 72 hours. CBG: No results for input(s): GLUCAP in the last 168 hours. Lipid Profile: No results for input(s): CHOL, HDL, LDLCALC, TRIG, CHOLHDL, LDLDIRECT in the last 72 hours. Thyroid Function Tests: No results for input(s): TSH, T4TOTAL, FREET4, T3FREE, THYROIDAB in the last 72 hours. Anemia Panel: No results for input(s): VITAMINB12, FOLATE, FERRITIN, TIBC, IRON, RETICCTPCT in the last 72 hours. Sepsis Labs: No results for input(s): PROCALCITON, LATICACIDVEN in the last 168 hours.  Recent Results (from the past 240 hour(s))  Resp Panel by RT-PCR (Flu A&B, Covid) Nasopharyngeal Swab     Status: None   Collection Time: 04/06/20 10:12 PM   Specimen: Nasopharyngeal Swab; Nasopharyngeal(NP) swabs in vial transport medium  Result Value Ref Range Status   SARS Coronavirus 2 by RT PCR NEGATIVE NEGATIVE Final    Comment: (NOTE) SARS-CoV-2 target nucleic acids are NOT DETECTED.  The SARS-CoV-2 RNA is generally detectable in upper respiratory specimens during the acute phase of infection. The lowest concentration of SARS-CoV-2 viral copies this assay can detect is 138 copies/mL. A negative result does not preclude SARS-Cov-2 infection and should not be used as the sole basis for treatment or other patient management decisions. A negative result may occur with  improper specimen collection/handling, submission of specimen other than nasopharyngeal swab, presence of viral mutation(s) within the areas targeted by this assay, and inadequate number of viral copies(<138 copies/mL). A negative result must be combined with clinical observations, patient history, and epidemiological information. The expected result is Negative.  Fact Sheet for Patients:   BloggerCourse.com  Fact Sheet for Healthcare Providers:  SeriousBroker.it  This test is no t yet approved or cleared by the [place] FDA and  has been authorized for detection and/or diagnosis of SARS-CoV-2 by FDA under an Emergency Use Authorization (EUA). This EUA will remain  in effect (meaning this test can be used) for the duration of the COVID-19 declaration under Section 564(b)(1) of the Act, 21 U.S.C.section 360bbb-3(b)(1), unless the authorization is terminated  or revoked sooner.       Influenza A by PCR NEGATIVE NEGATIVE Final   Influenza B by PCR NEGATIVE NEGATIVE Final    Comment: (NOTE) The Xpert Xpress SARS-CoV-2/FLU/RSV plus assay is intended as an aid in the diagnosis of influenza from Nasopharyngeal swab specimens and should not be used as a sole basis for treatment. Nasal washings and aspirates are unacceptable for Xpert Xpress SARS-CoV-2/FLU/RSV testing.  Fact Sheet for Patients: BloggerCourse.com  Fact Sheet for Healthcare Providers: SeriousBroker.it  This test is not yet approved or cleared by the [place] FDA and has been authorized for detection and/or diagnosis of SARS-CoV-2 by FDA under an Emergency Use Authorization (EUA). This EUA will remain in effect (meaning this test can be used) for the duration of the COVID-19 declaration under Section 564(b)(1) of the Act, 21 U.S.C. section 360bbb-3(b)(1), unless the authorization is terminated or revoked.  Performed at [facility], [address] [place], [place] [address]          Radiology Studies: DG Chest Portable 1 View  Result Date: 04/06/2020 CLINICAL DATA:  Progressive shortness of breath for 3 months. EXAM: PORTABLE CHEST 1 VIEW COMPARISON:  12/29/2019 FINDINGS: Stable low lung volumes. Patient is rotated. Stable upper normal heart size. Aortic atherosclerosis and tortuosity.  Chronic interstitial coarsening, unchanged. No acute airspace disease. No significant pleural effusion. No pneumothorax. Bones are diffusely under mineralized. IMPRESSION: Stable low lung volumes and chronic interstitial coarsening. No acute abnormality. Aortic Atherosclerosis (ICD10-I70.0). Electronically Signed   By: Narda Rutherford M.D.   On: 04/06/2020 18:57        Scheduled Meds: . albuterol  2.5 mg Nebulization Q6H  . apixaban  2.5 mg Oral BID  . dextromethorphan-guaiFENesin  1 tablet Oral BID  . furosemide  80 mg Oral BID  . ipratropium      . levothyroxine  50 mcg Oral Daily  . methylPREDNISolone (SOLU-MEDROL) injection  40 mg Intravenous Q12H  . metoprolol tartrate  25 mg Oral BID  . nystatin   Topical BID  . pantoprazole (PROTONIX) IV  40 mg Intravenous Q24H  . rosuvastatin  20 mg Oral QHS    LOS: 0 days    Time spent: 35 minutes    Flonnie Wierman Hoover Brunette, DO [facility]  If 7PM-7AM, please contact night-coverage [email or web address] 04/07/2020, 11:13 AM

## 2020-04-08 ENCOUNTER — Inpatient Hospital Stay (HOSPITAL_COMMUNITY): Payer: Medicare Other

## 2020-04-08 DIAGNOSIS — R0600 Dyspnea, unspecified: Secondary | ICD-10-CM | POA: Diagnosis not present

## 2020-04-08 DIAGNOSIS — J209 Acute bronchitis, unspecified: Secondary | ICD-10-CM | POA: Diagnosis not present

## 2020-04-08 DIAGNOSIS — J440 Chronic obstructive pulmonary disease with acute lower respiratory infection: Secondary | ICD-10-CM | POA: Diagnosis not present

## 2020-04-08 LAB — ECHOCARDIOGRAM COMPLETE
Area-P 1/2: 3.63 cm2
Height: 70 in
P 1/2 time: 556 msec
S' Lateral: 3.25 cm
Weight: 4864 oz

## 2020-04-08 LAB — CBC
HCT: 37.9 % — ABNORMAL LOW (ref 39.0–52.0)
Hemoglobin: 11.4 g/dL — ABNORMAL LOW (ref 13.0–17.0)
MCH: 25.9 pg — ABNORMAL LOW (ref 26.0–34.0)
MCHC: 30.1 g/dL (ref 30.0–36.0)
MCV: 85.9 fL (ref 80.0–100.0)
Platelets: 264 10*3/uL (ref 150–400)
RBC: 4.41 MIL/uL (ref 4.22–5.81)
RDW: 18.7 % — ABNORMAL HIGH (ref 11.5–15.5)
WBC: 13.5 10*3/uL — ABNORMAL HIGH (ref 4.0–10.5)
nRBC: 0 % (ref 0.0–0.2)

## 2020-04-08 LAB — BASIC METABOLIC PANEL
Anion gap: 12 (ref 5–15)
BUN: 43 mg/dL — ABNORMAL HIGH (ref 8–23)
CO2: 24 mmol/L (ref 22–32)
Calcium: 9.4 mg/dL (ref 8.9–10.3)
Chloride: 103 mmol/L (ref 98–111)
Creatinine, Ser: 1.94 mg/dL — ABNORMAL HIGH (ref 0.61–1.24)
GFR, Estimated: 34 mL/min — ABNORMAL LOW (ref >60)
Glucose, Bld: 134 mg/dL — ABNORMAL HIGH (ref 70–99)
Potassium: 4.5 mmol/L (ref 3.5–5.1)
Sodium: 139 mmol/L (ref 135–145)

## 2020-04-08 LAB — MAGNESIUM: Magnesium: 2.6 mg/dL — ABNORMAL HIGH (ref 1.7–2.4)

## 2020-04-08 LAB — PROCALCITONIN: Procalcitonin: <0.1 ng/mL

## 2020-04-08 MED ORDER — ALBUTEROL SULFATE (2.5 MG/3ML) 0.083% IN NEBU
2.5000 mg | INHALATION_SOLUTION | Freq: Two times a day (BID) | RESPIRATORY_TRACT | Status: DC
  Administered 2020-04-08 – 2020-04-10 (×4): 2.5 mg via RESPIRATORY_TRACT
  Filled 2020-04-08 (×4): qty 3

## 2020-04-08 NOTE — TOC Initial Note (Signed)
Transition of Care [facility]) - Initial/Assessment Note   Patient Details  Name: [name] MRN: [id number] Date of Birth: Aug 11, 1940  [facility]) CM/SW Contact:    Sherie Don, LCSW Phone Number: 04/08/2020, 4:07 PM  Clinical Narrative: Patient is an 80 year old male who was admitted for COPD with acute bronchitis. PT evaluation recommended SNF, however, per the Appropriate Use Committee, the patient is not being recommended for SNF.  CSW spoke with patient's daughter, [name], to discuss options for patient. CSW explained that SNF is not being recommended and daughter acknowledged patient has a history of leaving SNFs AMA. Per daughter, she tried to help the patient apply for LTC Medicaid a few years ago as the family cannot afford to private pay for a nursing facility, but the patient was denied as he had 2 life insurance policies. Daughter reported the patient now only has one, however, she is unsure if the patient will qualify this time either. CSW encouraged daughter to contact [facility] today to start another LTC Medicaid application as facilities will only accept a patient that can private pay or if they have LTC Medicaid/application is pending. TOC to follow.  Expected Discharge Plan: [place] Barriers to Discharge: Continued Medical Work up  Patient Goals and CMS Choice Patient states their goals for this hospitalization and ongoing recovery are:: Find LTC placement CMS Medicare.gov Compare Post Acute Care list provided to:: Patient Represenative (must comment) Choice offered to / list presented to : Adult Children  Expected Discharge Plan and Services Expected Discharge Plan: [place] In-house Referral: Clinical Social Work Discharge Planning Services: NA Post Acute Care Choice: [place] arrangements for the past 2 months: Mobile Home             DME Arranged: N/A DME Agency: NA HH Arranged: NA [place]  Agency: NA  Prior Living Arrangements/Services Living arrangements for the past 2 months: Mobile Home Lives with:: Self Patient language and need for interpreter reviewed:: Yes Do you feel safe going back to the place where you live?: No   Patient reported he cannot care for himself at home  Need for Family Participation in Patient Care: Yes (Comment) Care giver support system in place?: Yes (comment) Criminal Activity/Legal Involvement Pertinent to Current Situation/Hospitalization: No - Comment as needed  Activities of Daily Living Home Assistive Devices/Equipment: [place] (specify type),Blood pressure cuff ADL Screening (condition at time of admission) Patient's cognitive ability adequate to safely complete daily activities?: Yes Is the patient deaf or have difficulty hearing?: No Does the patient have difficulty seeing, even when wearing glasses/contacts?: No Does the patient have difficulty concentrating, remembering, or making decisions?: No Patient able to express need for assistance with ADLs?: Yes Does the patient have difficulty dressing or bathing?: No Independently performs ADLs?: Yes (appropriate for developmental age) Does the patient have difficulty walking or climbing stairs?: No Weakness of Legs: Both Weakness of Arms/Hands: None  Emotional Assessment Orientation: : Oriented to Self,Oriented to Place,Oriented to  Time,Oriented to Situation Alcohol / Substance Use: Not Applicable Psych Involvement: No (comment)  Admission diagnosis:  Hypoxia [R09.02] COPD exacerbation ([place]) [J44.1] Acute respiratory failure with hypoxia ([place]) [J96.01] Patient Active Problem List   Diagnosis Date Noted  . Acute respiratory failure with hypoxia ([place]) 04/07/2020  . COPD with acute bronchitis ([place]) 04/07/2020  . Candidal intertrigo 04/07/2020  . History of stroke 04/07/2020  . Chronic systolic CHF (congestive heart failure) ([place])  04/07/2020  . Prolonged QT interval 04/07/2020  .  Hypothyroidism 04/07/2020  . CAP (community acquired pneumonia) 02/25/2019  . Rhabdomyolysis 02/25/2019  . Chest pain, rule out acute myocardial infarction   . Chest pain 03/21/2015  . Dysphagia 03/21/2015  . CKD (chronic kidney disease), stage III ([place])   . CN (constipation) 11/13/2014  . Dysphagia, pharyngoesophageal phase 11/13/2014  . Coronary artery disease involving native coronary artery of native heart without angina pectoris 07/31/2014  . Chronic atrial fibrillation ([place]) 07/31/2014  . Adenomatous colon polyp 03/01/2014  . Swelling of limb- Right Leg > Left leg 09/13/2013  . Carotid stenosis 09/08/2012  . HLD (hyperlipidemia) 09/03/2008  . LEUKOCYTOSIS 09/03/2008  . Anxiety state 09/03/2008  . TOBACCO ABUSE 09/03/2008  . NEUROPATHY 09/03/2008  . Essential hypertension 09/03/2008  . Coronary atherosclerosis 09/03/2008  . BRADYCARDIA 09/03/2008  . Cerebral artery occlusion with cerebral infarction ([place]) 09/03/2008  . GASTROESOPHAGEAL REFLUX DISEASE 09/03/2008  . BPH (benign prostatic hyperplasia) 09/03/2008   PCP:  Marton Redwood, MD Pharmacy:   [id number] [place], [address] [place] [address] Phone: [phone] Fax: [phone]  [id number] [place], [place] - [address] [place] [address] Phone: [phone] Fax: [phone]  Readmission Risk Interventions No flowsheet data found.

## 2020-04-08 NOTE — Plan of Care (Signed)
  Problem: Acute Rehab PT Goals(only PT should resolve) Goal: Pt will Roll Supine to Side Outcome: Progressing Flowsheets (Taken 04/08/2020 1415) Pt will Roll Supine to Side: with mod assist Goal: Pt Will Go Supine/Side To Sit Outcome: Progressing Flowsheets (Taken 04/08/2020 1415) Pt will go Supine/Side to Sit: with moderate assist Goal: Pt Will Go Sit To Supine/Side Outcome: Progressing Flowsheets (Taken 04/08/2020 1415) Pt will go Sit to Supine/Side: with moderate assist Goal: Patient Will Transfer Sit To/From Stand Outcome: Progressing Flowsheets (Taken 04/08/2020 1415) Patient will transfer sit to/from stand: with moderate assist Goal: Pt Will Transfer Bed To Chair/Chair To Bed Outcome: Progressing Flowsheets (Taken 04/08/2020 1415) Pt will Transfer Bed to Chair/Chair to Bed: min guard assist Goal: Pt Will Ambulate Outcome: Progressing Flowsheets (Taken 04/08/2020 1415) Pt will Ambulate:  50 feet  with min guard assist  with rolling [place]   Pamala Hurry D. Hartnett-Rands, MS, PT Per [place] [phone] 04/08/2020

## 2020-04-08 NOTE — Progress Notes (Signed)
PROGRESS NOTE    [name]  GEX:[id number] DOB: 04/09/1940 DOA: 04/06/2020 PCP: James Clan, MD   Brief Narrative:  Per HPI: [name] a 80 y.o.malewith medical history significant forparoxysmal atrial fibrillation on apixaban, history of CAD, COPD, CVA x2, dysphagia, anxiety disorder, carotid artery disease, morbid obesity, GERD, hypertensionandmorbid obesity department who presents to the emergency department due to shortness of breath and increased work of breathing. Patient was only able to provide limited history, respiratory history was obtained from ED physician and ED medical record. Patient states that he has had slowly and progressive shortness of breath within last 3 months and that the shortness of breath worsened within last several days. He states that he ambulates with a Mcduffey and easily gets short of breath after a few steps within last few days. Apparently, EMS was activated and on arrival of EMS, patient was started on a nonrebreather due to hypoxia. Steroids andalbuterol breathing treatment was provided in route. He denies chest pain, fever, chills, nausea or vomiting.  Assessment & Plan:   Principal Problem:   COPD with acute bronchitis (HCC) Active Problems:   HLD (hyperlipidemia)   Essential hypertension   GASTROESOPHAGEAL REFLUX DISEASE   Coronary artery disease involving native coronary artery of native heart without angina pectoris   Chronic atrial fibrillation (HCC)   Acute respiratory failure with hypoxia (HCC)   Candidal intertrigo   History of stroke   Chronic systolic CHF (congestive heart failure) (HCC)   Prolonged QT interval   Hypothyroidism   Acute respiratory failure with hypoxia possibly secondary to COPD with acute bronchitis Continuealbuterol, Mucinex, Solu-Medrol. Continue Protonix to prevent steroid-induced ulcer Continue incentive spirometry and flutter valve Continue supplemental oxygen to maintain O2 sat >  92% with plan to wean patient off oxygen as tolerated  Worsening dyspnea on exertion 2D echocardiogram with LVEF 45-50% and no significant valvular abnormalities noted Prior in 5/21 with LVEF 45-50%; see below  Candidal intertrigo Patient presents with intertrigo in the abdominal and groin area Continue nystatin powder  Borderline prolonged QTc(481 ms) Avoid QT prolonging drugs Magnesium level ok  Hypothyroidism Continue Synthroid  Chronic CHF BNP 66,this was 1853 about 9 months ago Patient presents with bilateral leg swelling Continue Lasix per home regimen (monitor for worsening creatinine) Continue metoprolol per home regimen Cardiogram done on 06/26/2019 showed LVEF of 45 to 50% with LV demonstrating regional wall motion abnormalities, severe hypokinesis of the basal to mid inferior wall and the basal inferoseptal wall  Acute kidney injury-fluctuating BUN/creatinine 36/1.8 (baseline creatinine at 1.5-1.6),creatinine was 2.12 about 4 months ago Renally adjust medications, avoid nephrotoxic agents/dehydration/hypotension  Chronic atrial fibrillation/history of stroke Continue metoprolol and apixaban  CAD Continue metoprolol  Essential hypertension Continue metoprolol and oral Lasix  Hyperlipidemia Continue Crestor  Morbid obesity BMI 43.62 kg/m2   DVT prophylaxis:Eliquis Code Status: DNR Family Communication: Discussed with daughter on 2/14  Disposition Plan:  Status is: Inpatient  Remains inpatient appropriate because:Ongoing diagnostic testing needed not appropriate for outpatient work up, IV treatments appropriate due to intensity of illness or inability to take PO and Inpatient level of care appropriate due to severity of illness   Dispo: The patient is from: Home  Anticipated d/c is to: SNF  Anticipated d/c date is: 1-3 days  Patient currently is medically stable to d/c. PT evaluation pending.               Difficult to place patient No   Consultants:   None  Procedures:  See below  Antimicrobials:   None   Subjective: Patient seen and evaluated today with no new acute complaints or concerns. No acute concerns or events noted overnight.  Objective: Vitals:   04/07/20 1937 04/08/20 0053 04/08/20 0528 04/08/20 0736  BP:  121/69 (!) 110/59   Pulse: 79 75 75   Resp: 18 18 18    Temp:  98.8 F (37.1 C) 98.6 F (37 C)   TempSrc:  Oral Oral   SpO2: 95% 97% 94% 94%  Weight:      Height:        Intake/Output Summary (Last 24 hours) at 04/08/2020 1211 Last data filed at 04/08/2020 0900 Gross per 24 hour  Intake 0 ml  Output 1750 ml  Net -1750 ml   Filed Weights   04/06/20 1805  Weight: (!) 137.9 kg    Examination:  General exam: Appears calm and comfortable  Respiratory system: Clear to auscultation. Respiratory effort normal. On 2L [place]. Cardiovascular system: S1 & S2 heard, RRR.  Gastrointestinal system: Abdomen is soft Central nervous system: Alert and awake Extremities: No edema Skin: No significant lesions noted Psychiatry: Flat affect.    Data Reviewed: I have personally reviewed following labs and imaging studies  CBC: Recent Labs  Lab 04/06/20 1828 04/07/20 0940 04/08/20 0558  WBC 7.8 11.7* 13.5*  NEUTROABS 6.6  --   --   HGB 11.8* 11.9* 11.4*  HCT 38.9* 39.0 37.9*  MCV 86.1 84.8 85.9  PLT 251 250 264   Basic Metabolic Panel: Recent Labs  Lab 04/06/20 1828 04/07/20 0940 04/08/20 0558  NA 138 139 139  K 3.9 4.2 4.5  CL 103 104 103  CO2 25 24 24   GLUCOSE 131* 156* 134*  BUN 39* 36* 43*  CREATININE 2.01* 1.82* 1.94*  CALCIUM 9.0 9.6 9.4  MG  --  2.5* 2.6*  PHOS  --  2.6  --    GFR: Estimated Creatinine Clearance: 41.8 mL/min (A) (by C-G formula based on SCr of 1.94 mg/dL (H)). Liver Function Tests: Recent Labs  Lab 04/06/20 1828 04/07/20 0940  AST 26 28  ALT 17 19  ALKPHOS 80 73  BILITOT 0.7 0.7  PROT 7.0 7.2  ALBUMIN  3.5 3.3*   No results for input(s): LIPASE, AMYLASE in the last 168 hours. No results for input(s): AMMONIA in the last 168 hours. Coagulation Profile: Recent Labs  Lab 04/07/20 0940  INR 1.2   Cardiac Enzymes: No results for input(s): CKTOTAL, CKMB, CKMBINDEX, TROPONINI in the last 168 hours. BNP (last 3 results) Recent Labs    12/04/19 1015  PROBNP 1,167*   HbA1C: No results for input(s): HGBA1C in the last 72 hours. CBG: No results for input(s): GLUCAP in the last 168 hours. Lipid Profile: No results for input(s): CHOL, HDL, LDLCALC, TRIG, CHOLHDL, LDLDIRECT in the last 72 hours. Thyroid Function Tests: No results for input(s): TSH, T4TOTAL, FREET4, T3FREE, THYROIDAB in the last 72 hours. Anemia Panel: No results for input(s): VITAMINB12, FOLATE, FERRITIN, TIBC, IRON, RETICCTPCT in the last 72 hours. Sepsis Labs: Recent Labs  Lab 04/08/20 0558  PROCALCITON <0.10    Recent Results (from the past 240 hour(s))  Resp Panel by RT-PCR (Flu A&B, Covid) Nasopharyngeal Swab     Status: None   Collection Time: 04/06/20 10:12 PM   Specimen: Nasopharyngeal Swab; Nasopharyngeal(NP) swabs in vial transport medium  Result Value Ref Range Status   SARS Coronavirus 2 by RT PCR NEGATIVE NEGATIVE Final    Comment: (NOTE) SARS-CoV-2  target nucleic acids are NOT DETECTED.  The SARS-CoV-2 RNA is generally detectable in upper respiratory specimens during the acute phase of infection. The lowest concentration of SARS-CoV-2 viral copies this assay can detect is 138 copies/mL. A negative result does not preclude SARS-Cov-2 infection and should not be used as the sole basis for treatment or other patient management decisions. A negative result may occur with  improper specimen collection/handling, submission of specimen other than nasopharyngeal swab, presence of viral mutation(s) within the areas targeted by this assay, and inadequate number of viral copies(<138 copies/mL). A negative  result must be combined with clinical observations, patient history, and epidemiological information. The expected result is Negative.  Fact Sheet for Patients:  BloggerCourse.com  Fact Sheet for Healthcare Providers:  SeriousBroker.it  This test is no t yet approved or cleared by the [place] FDA and  has been authorized for detection and/or diagnosis of SARS-CoV-2 by FDA under an Emergency Use Authorization (EUA). This EUA will remain  in effect (meaning this test can be used) for the duration of the COVID-19 declaration under Section 564(b)(1) of the Act, 21 U.S.C.section 360bbb-3(b)(1), unless the authorization is terminated  or revoked sooner.       Influenza A by PCR NEGATIVE NEGATIVE Final   Influenza B by PCR NEGATIVE NEGATIVE Final    Comment: (NOTE) The Xpert Xpress SARS-CoV-2/FLU/RSV plus assay is intended as an aid in the diagnosis of influenza from Nasopharyngeal swab specimens and should not be used as a sole basis for treatment. Nasal washings and aspirates are unacceptable for Xpert Xpress SARS-CoV-2/FLU/RSV testing.  Fact Sheet for Patients: BloggerCourse.com  Fact Sheet for Healthcare Providers: SeriousBroker.it  This test is not yet approved or cleared by the [place] FDA and has been authorized for detection and/or diagnosis of SARS-CoV-2 by FDA under an Emergency Use Authorization (EUA). This EUA will remain in effect (meaning this test can be used) for the duration of the COVID-19 declaration under Section 564(b)(1) of the Act, 21 U.S.C. section 360bbb-3(b)(1), unless the authorization is terminated or revoked.  Performed at [facility], [address] [place], [place] [address]          Radiology Studies: DG Chest Portable 1 View  Result Date: 04/06/2020 CLINICAL DATA:  Progressive shortness of breath for 3 months. EXAM: PORTABLE  CHEST 1 VIEW COMPARISON:  12/29/2019 FINDINGS: Stable low lung volumes. Patient is rotated. Stable upper normal heart size. Aortic atherosclerosis and tortuosity. Chronic interstitial coarsening, unchanged. No acute airspace disease. No significant pleural effusion. No pneumothorax. Bones are diffusely under mineralized. IMPRESSION: Stable low lung volumes and chronic interstitial coarsening. No acute abnormality. Aortic Atherosclerosis (ICD10-I70.0). Electronically Signed   By: Narda Rutherford M.D.   On: 04/06/2020 18:57   ECHOCARDIOGRAM COMPLETE  Result Date: 04/08/2020    ECHOCARDIOGRAM REPORT   Patient Name:   [name] Date of Exam: 04/08/2020 Medical Rec #:  [id number]        Height:       70.0 in Accession #:    [id number]       Weight:       304.0 lb Date of Birth:  Nov 14, 1940        BSA:          2.494 m Patient Age:    79 years         BP:           110/59 mmHg Patient Gender: M  HR:           75 bpm. [facility] Procedure: 2D Echo Indications:    Dyspnea R06.00  History:        Patient has prior history of Echocardiogram examinations, most                 recent 06/26/2019. CHF, COPD, Arrythmias:Atrial Fibrillation,                 Signs/Symptoms:Chest Pain; Risk Factors:Hypertension,                 Dyslipidemia and Former Smoker. GERD.  Sonographer:    Jeryl Columbia RDCS (AE) Referring Phys: [phone] [facility] IMPRESSIONS  1. Mild diffuse hypokinesis in afib slighlty worse in septum . Left ventricular ejection fraction, by estimation, is 45 to 50%. The left ventricle has mildly decreased function. The left ventricle has no regional wall motion abnormalities. There is mild  left ventricular hypertrophy. Left ventricular diastolic parameters are indeterminate.  2. Right ventricular systolic function is normal. The right ventricular size is normal.  3. The mitral valve is normal in structure. Trivial mitral valve regurgitation. No evidence of mitral stenosis.  4.  The aortic valve is tricuspid. Aortic valve regurgitation is trivial. Mild to moderate aortic valve sclerosis/calcification is present, without any evidence of aortic stenosis.  5. The inferior vena cava is normal in size with greater than 50% respiratory variability, suggesting right atrial pressure of 3 mmHg. FINDINGS  Left Ventricle: Mild diffuse hypokinesis in afib slighlty worse in septum. Left ventricular ejection fraction, by estimation, is 45 to 50%. The left ventricle has mildly decreased function. The left ventricle has no regional wall motion abnormalities. The left ventricular internal cavity size was normal in size. There is mild left ventricular hypertrophy. Left ventricular diastolic parameters are indeterminate. Right Ventricle: The right ventricular size is normal. No increase in right ventricular wall thickness. Right ventricular systolic function is normal. Left Atrium: Left atrial size was normal in size. Right Atrium: Right atrial size was normal in size. Pericardium: There is no evidence of pericardial effusion. Mitral Valve: The mitral valve is normal in structure. There is mild thickening of the mitral valve leaflet(s). There is mild calcification of the mitral valve leaflet(s). Mild mitral annular calcification. Trivial mitral valve regurgitation. No evidence  of mitral valve stenosis. Tricuspid Valve: The tricuspid valve is normal in structure. Tricuspid valve regurgitation is not demonstrated. No evidence of tricuspid stenosis. Aortic Valve: The aortic valve is tricuspid. Aortic valve regurgitation is trivial. Aortic regurgitation PHT measures 556 msec. Mild to moderate aortic valve sclerosis/calcification is present, without any evidence of aortic stenosis. Pulmonic Valve: The pulmonic valve was normal in structure. Pulmonic valve regurgitation is not visualized. No evidence of pulmonic stenosis. Aorta: The aortic root is normal in size and structure. Venous: The inferior vena cava is  normal in size with greater than 50% respiratory variability, suggesting right atrial pressure of 3 mmHg. IAS/Shunts: No atrial level shunt detected by color flow Doppler.  LEFT VENTRICLE PLAX 2D LVIDd:         3.70 cm  Diastology LVIDs:         3.25 cm  LV e' medial:    7.72 cm/s LV PW:         1.29 cm  LV E/e' medial:  16.2 LV IVS:        1.39 cm  LV e' lateral:   9.46 cm/s LVOT diam:  1.90 cm  LV E/e' lateral: 13.2 LVOT Area:     2.84 cm  RIGHT VENTRICLE RV S prime:     10.60 cm/s TAPSE (M-mode): 1.8 cm LEFT ATRIUM           Index       RIGHT ATRIUM           Index LA diam:      2.90 cm 1.16 cm/m  RA Area:     14.70 cm LA Vol (A2C): 45.2 ml 18.12 ml/m RA Volume:   39.50 ml  15.84 ml/m LA Vol (A4C): 57.2 ml 22.94 ml/m  AORTIC VALVE AI PHT:      556 msec  AORTA Ao Root diam: 3.60 cm MITRAL VALVE                TRICUSPID VALVE MV Area (PHT): 3.63 cm     TR Peak grad:   28.1 mmHg MV Decel Time: 209 msec     TR Vmax:        265.00 cm/s MV E velocity: 125.00 cm/s MV A velocity: 38.60 cm/s   SHUNTS MV E/A ratio:  3.24         Systemic Diam: 1.90 cm Charlton Haws MD Electronically signed by Charlton Haws MD Signature Date/Time: 04/08/2020/11:39:28 AM    Final         Scheduled Meds: . albuterol  2.5 mg Nebulization BID  . apixaban  2.5 mg Oral BID  . dextromethorphan-guaiFENesin  1 tablet Oral BID  . furosemide  80 mg Oral BID  . levothyroxine  50 mcg Oral Daily  . methylPREDNISolone (SOLU-MEDROL) injection  40 mg Intravenous Q12H  . metoprolol tartrate  25 mg Oral BID  . nystatin   Topical BID  . pantoprazole (PROTONIX) IV  40 mg Intravenous Q24H  . rosuvastatin  20 mg Oral QHS    LOS: 1 day    Time spent: 35 minutes    Saadia Dewitt [facility] Brunette, DO [facility]  If 7PM-7AM, please contact night-coverage [email or web address] 04/08/2020, 12:11 PM

## 2020-04-08 NOTE — Progress Notes (Signed)
*  PRELIMINARY RESULTS* Echocardiogram 2D Echocardiogram has been performed.  Leavy Cella 04/08/2020, 9:48 AM

## 2020-04-08 NOTE — TOC Progression Note (Signed)
Transition of Care [facility]) - Progression Note    Patient Details  Name: [name] MRN: [id number] Date of Birth: 23-Sep-1940  [facility]) CM/SW Contact  Roda Shutters Margretta Sidle, RN Phone Number: 04/08/2020, 3:22 PM  The Appropriate Use Committee has met to discuss this patient's plan of care to review recommendations for post-acute treatment options.  The options were reviewed with the attending MD, TOC, rehab services and the physician advisor.  All available documentation has been reviewed and the determination has been made that this patient does not meet criteria for placement in a [place] for short-term rehab. A consult to the Transitions of Care Team has been made to discuss alternative options and discharge planning with the patient/family.

## 2020-04-08 NOTE — Evaluation (Signed)
Physical Therapy Evaluation Patient Details Name: [name] MRN: [id number] DOB: January 18, 1941 Today's Date: 04/08/2020   History of Present Illness  [name] is a 80 y.o. male with medical history significant forparoxysmal atrial fibrillation on apixaban, history of CAD, COPD, CVA x2, dysphagia, anxiety disorder, carotid artery disease, morbid obesity, GERD, hypertension and morbid obesity department who presents to the emergency department due to shortness of breath and increased work of breathing.  Patient was only able to provide limited history, respiratory history was obtained from ED physician and ED medical record.  Patient states that he has had slowly and progressive shortness of breath within last 3 months and that the shortness of breath worsened within last several days.  He states that he ambulates with a Bohan and easily gets short of breath after a few steps within last few days.  Apparently, EMS was activated and on arrival of EMS, patient was started on a nonrebreather due to hypoxia .  Steroids and albuterol breathing treatment was provided in route.  He denies chest pain, fever, chills, nausea or vomiting.    Clinical Impression  Pt admitted with above diagnosis. Patient agreeable to participating in PT evaluation today. Patient requires assistance for bed mobility and use of bed rails. Patient requires cues and assistance for transfers and ambulation with RW. Slow, labored ambulation with RW and 2 LPM O2, complaints of shortness of breathe; dyspnea on exertion noted; limited by fatigue; cues to walk within base of support for more erect posturing and safety. Patient reports 4 stairs to climb to get into his home. He reports he sleeps in his lift chair as he is unable to stand up from his bed at home. Pt currently with functional limitations due to the deficits listed below (see PT Problem List). Pt will benefit from skilled PT to increase their independence and safety with  mobility to allow discharge to the venue listed below.       Follow Up Recommendations SNF;Supervision/Assistance - 24 hour    Equipment Recommendations  None recommended by PT    Recommendations for Other Services       Precautions / Restrictions Precautions Precautions: Fall Precaution Comments: patient reports no falls in the last 6 months Restrictions Weight Bearing Restrictions: No      Mobility  Bed Mobility Overal bed mobility: Needs Assistance Bed Mobility: Supine to Sit     Supine to sit: Mod assist;HOB elevated     General bed mobility comments: mod assist for trunk and scooting to edge of bed    Transfers Overall transfer level: Needs assistance Equipment used: Rolling [place] (2 wheeled) Transfers: Sit to/from Stand Sit to Stand: From elevated surface;Mod assist;Min assist    General transfer comment: max assist from regular height hospital bed; cues for sequencing of steps, placement of hands, and more narrow base of support  Ambulation/Gait Ambulation/Gait assistance: Min guard Gait Distance (Feet): 20 Feet Assistive device: Rolling [place] (2 wheeled) Gait Pattern/deviations: Step-through pattern;Decreased step length - right;Decreased step length - left;Decreased stride length;Trunk flexed;Wide base of support Gait velocity: decreased   General Gait Details: slow, labored ambulation with RW and 2 LPM O2, complaints of shortness of breathe; dyspnea on exertion noted; limited by fatigue; cues to walk within base of support for more erect posturing and safety  Stairs     Wheelchair Mobility    Modified Rankin (Stroke Patients Only)       Balance Overall balance assessment: Needs assistance Sitting-balance support: Bilateral upper extremity  supported;Feet supported Sitting balance-Leahy Scale: Fair     Standing balance support: Bilateral upper extremity supported;During functional activity Standing balance-Leahy Scale: Fair Standing balance  comment: fair with RW; heavy use of upper extremities       Pertinent Vitals/Pain Pain Assessment: No/denies pain    Home Living Family/patient expects to be discharged to:: Private residence Living Arrangements: Alone Available Help at Discharge: Friend(s);Available PRN/intermittently;Family Type of Home: Mobile home Home Access: Stairs to enter Entrance Stairs-Rails: Left Entrance Stairs-Number of Steps: 4 Home Layout: One level Home Equipment: Clinical cytogeneticist - 2 wheels;Grab bars - tub/shower;Grab bars - toilet Additional Comments: lift chair    Prior Function Level of Independence: Needs assistance   Gait / Transfers Assistance Needed: ambulation with RW; recently limited to household distances  ADL's / Homemaking Assistance Needed: independent with BADLs; assistance for IADLs        Hand Dominance        Extremity/Trunk Assessment   Upper Extremity Assessment Upper Extremity Assessment: Generalized weakness    Lower Extremity Assessment Lower Extremity Assessment: Generalized weakness    Cervical / Trunk Assessment Cervical / Trunk Assessment: Kyphotic (head side bent to right; rotated left)  Communication   Communication: No difficulties  Cognition Arousal/Alertness: Awake/alert Behavior During Therapy: WFL for tasks assessed/performed Overall Cognitive Status: Within Functional Limits for tasks assessed      General Comments      Exercises     Assessment/Plan    PT Assessment Patient needs continued PT services  PT Problem List Decreased strength;Decreased knowledge of use of DME;Decreased activity tolerance;Decreased balance;Decreased mobility       PT Treatment Interventions DME instruction;Balance training;Gait training;Patient/family education;Functional mobility training;Therapeutic activities;Therapeutic exercise    PT Goals (Current goals can be found in the Care Plan section)  Acute Rehab PT Goals Patient Stated Goal: Go get  stronger before going home. PT Goal Formulation: With patient Time For Goal Achievement: 04/22/20 Potential to Achieve Goals: Fair    Frequency Min 3X/week   Barriers to discharge           AM-PAC PT "6 Clicks" Mobility  Outcome Measure Help needed turning from your back to your side while in a flat bed without using bedrails?: A Lot Help needed moving from lying on your back to sitting on the side of a flat bed without using bedrails?: A Lot Help needed moving to and from a bed to a chair (including a wheelchair)?: A Little Help needed standing up from a chair using your arms (e.g., wheelchair or bedside chair)?: A Lot Help needed to walk in hospital room?: A Little Help needed climbing 3-5 steps with a railing? : Total 6 Click Score: 13    End of Session Equipment Utilized During Treatment: Oxygen Activity Tolerance: Patient limited by fatigue Patient left: in chair;with call bell/phone within reach Nurse Communication: Mobility status PT Visit Diagnosis: Unsteadiness on feet (R26.81);Other abnormalities of gait and mobility (R26.89);Muscle weakness (generalized) (M62.81);Difficulty in walking, not elsewhere classified (R26.2)    Time: 1749-4496 PT Time Calculation (min) (ACUTE ONLY): 25 min   Charges:   PT Evaluation $PT Eval Low Complexity: 1 Low PT Treatments $Therapeutic Activity: 8-22 mins        Floria Raveling. Hartnett-Rands, MS, PT Per [place] [phone] 04/08/2020, 2:10 PM

## 2020-04-08 NOTE — Progress Notes (Signed)
Assisted back to bed after being up for several hours this afternoon.

## 2020-04-09 DIAGNOSIS — J440 Chronic obstructive pulmonary disease with acute lower respiratory infection: Secondary | ICD-10-CM | POA: Diagnosis not present

## 2020-04-09 DIAGNOSIS — J209 Acute bronchitis, unspecified: Secondary | ICD-10-CM | POA: Diagnosis not present

## 2020-04-09 LAB — BASIC METABOLIC PANEL
Anion gap: 10 (ref 5–15)
BUN: 50 mg/dL — ABNORMAL HIGH (ref 8–23)
CO2: 27 mmol/L (ref 22–32)
Calcium: 9.1 mg/dL (ref 8.9–10.3)
Chloride: 102 mmol/L (ref 98–111)
Creatinine, Ser: 1.8 mg/dL — ABNORMAL HIGH (ref 0.61–1.24)
GFR, Estimated: 37 mL/min — ABNORMAL LOW (ref >60)
Glucose, Bld: 105 mg/dL — ABNORMAL HIGH (ref 70–99)
Potassium: 3.7 mmol/L (ref 3.5–5.1)
Sodium: 139 mmol/L (ref 135–145)

## 2020-04-09 LAB — CBC
HCT: 38.3 % — ABNORMAL LOW (ref 39.0–52.0)
Hemoglobin: 11.6 g/dL — ABNORMAL LOW (ref 13.0–17.0)
MCH: 26.3 pg (ref 26.0–34.0)
MCHC: 30.3 g/dL (ref 30.0–36.0)
MCV: 86.8 fL (ref 80.0–100.0)
Platelets: 264 10*3/uL (ref 150–400)
RBC: 4.41 MIL/uL (ref 4.22–5.81)
RDW: 18.6 % — ABNORMAL HIGH (ref 11.5–15.5)
WBC: 11.5 10*3/uL — ABNORMAL HIGH (ref 4.0–10.5)
nRBC: 0 % (ref 0.0–0.2)

## 2020-04-09 LAB — MAGNESIUM: Magnesium: 2.6 mg/dL — ABNORMAL HIGH (ref 1.7–2.4)

## 2020-04-09 MED ORDER — FUROSEMIDE 10 MG/ML IJ SOLN
80.0000 mg | Freq: Two times a day (BID) | INTRAMUSCULAR | Status: DC
  Administered 2020-04-09 – 2020-04-10 (×3): 80 mg via INTRAVENOUS
  Filled 2020-04-09 (×3): qty 8

## 2020-04-09 MED ORDER — PREDNISONE 20 MG PO TABS
40.0000 mg | ORAL_TABLET | Freq: Every day | ORAL | Status: DC
  Administered 2020-04-10: 40 mg via ORAL
  Filled 2020-04-09 (×2): qty 2

## 2020-04-09 MED ORDER — OXYCODONE HCL 5 MG PO TABS
5.0000 mg | ORAL_TABLET | Freq: Once | ORAL | Status: AC
  Administered 2020-04-09: 5 mg via ORAL
  Filled 2020-04-09: qty 1

## 2020-04-09 NOTE — Progress Notes (Signed)
Spoke with Dr. Manuella Ghazi this am, requested guidance regarding dressings on bilateral lower legs. Pt states that dressings had been placed by home health nurse approximately one month ago. Dressings removed and legs/skin evaluated by both nurse and MD. Skin scaley, bronzed in color. 3 small bleeding areas to left medial shin area and 3areas noted on right medial shin. Legs cleaned with soap and water, dried. Dry, non-stick guaze pad applied over bleeding areas, secured with kerlix and ace wrap per MD Manuella Ghazi. Pt tolerated well.

## 2020-04-09 NOTE — Progress Notes (Signed)
MD notified of pt reported right leg pain 6/10. I offered PRN tylenol, pt refused and stated he takes hydrocodone at home.

## 2020-04-09 NOTE — Progress Notes (Signed)
Physical Therapy Treatment Patient Details Name: [name] MRN: [id number] DOB: 08/24/1940 Today's Date: 04/09/2020    History of Present Illness [name] is a 80 y.o. male with medical history significant forparoxysmal atrial fibrillation on apixaban, history of CAD, COPD, CVA x2, dysphagia, anxiety disorder, carotid artery disease, morbid obesity, GERD, hypertension and morbid obesity department who presents to the emergency department due to shortness of breath and increased work of breathing.  Patient was only able to provide limited history, respiratory history was obtained from ED physician and ED medical record.  Patient states that he has had slowly and progressive shortness of breath within last 3 months and that the shortness of breath worsened within last several days.  He states that he ambulates with a [place] and easily gets short of breath after a few steps within last few days.  Apparently, EMS was activated and on arrival of EMS, patient was started on a nonrebreather due to hypoxia .  Steroids and albuterol breathing treatment was provided in route.  He denies chest pain, fever, chills, nausea or vomiting.    PT Comments    Demonstrates improved activity tolerance today albeit requiring frequent therapeutic rest periods between mobility and repositioning due to exertion/fatigue and notable wheezing with exertion.  Cues in pursed-lip breathing and for body mechanics to improve flexion over BOS to decrease assistance with sit to stand with improved performed during course of treatment requiring mod A for sit to stand from elevated EOB and elevated recliner chair height.  Continues to demo reduced functional activity tolerance and generalized weakness requiring physical assistance for mobility. Vitals monitored throughout and maintains sats 93-97% with supplemental O2 at 3 L    Follow Up Recommendations  SNF;Supervision/Assistance - 24 hour     Equipment  Recommendations  None recommended by PT    Recommendations for Other Services       Precautions / Restrictions      Mobility  Bed Mobility Overal bed mobility: Needs Assistance Bed Mobility: Supine to Sit;Sit to Supine     Supine to sit: Mod assist;HOB elevated Sit to supine: Min assist   General bed mobility comments: mod assist for trunk and scooting to edge of bed. Assist for upper body positioning and scooting to [facility]    Transfers Overall transfer level: Needs assistance Equipment used: Rolling Hobbins (2 wheeled) Transfers: Sit to/from [employer] Sit to Stand: From elevated surface;Mod assist Stand pivot transfers: Mod assist       General transfer comment: max assist from regular height hospital bed; cues for sequencing of steps, placement of hands, and more narrow base of support  Ambulation/Gait Ambulation/Gait assistance: Min guard Gait Distance (Feet): 10 Feet (2 bouts) Assistive device: Rolling Molesky (2 wheeled) Gait Pattern/deviations: Step-through pattern;Decreased step length - right;Decreased step length - left;Decreased stride length;Trunk flexed;Wide base of support Gait velocity: decreased   General Gait Details: slow, labored ambulation with RW and 2 LPM O2, complaints of shortness of breathe; dyspnea on exertion noted; limited by fatigue; cues to walk within base of support for more erect posturing and safety   Stairs             Wheelchair Mobility    Modified Rankin (Stroke Patients Only)       Balance Overall balance assessment: Needs assistance Sitting-balance support: Bilateral upper extremity supported;Feet supported Sitting balance-Leahy Scale: Fair     Standing balance support: Bilateral upper extremity supported;During functional activity Standing balance-Leahy Scale: Fair Standing balance comment:  fair with RW; heavy use of upper extremities                            Cognition  Arousal/Alertness: Awake/alert Behavior During Therapy: WFL for tasks assessed/performed Overall Cognitive Status: Within Functional Limits for tasks assessed                                        Exercises General Exercises - Lower Extremity Ankle Circles/Pumps: AROM;Both;20 reps Quad Sets: Strengthening;Both;20 reps Long Arc Quad: Strengthening;Both;20 reps Heel Slides: AROM;Both;20 reps Hip ABduction/ADduction: Strengthening;Both;20 reps    General Comments        Pertinent Vitals/Pain Pain Assessment: No/denies pain    Home Living Family/patient expects to be discharged to:: Private residence Living Arrangements: Alone Available Help at Discharge: Friend(s);Available PRN/intermittently;Family Type of Home: Mobile home Home Access: Stairs to enter Entrance Stairs-Rails: Left Home Layout: One level Home Equipment: Emergency planning/management officer - 2 wheels;Grab bars - tub/shower;Grab bars - toilet Additional Comments: lift chair    Prior Function Level of Independence: Needs assistance  Gait / Transfers Assistance Needed: ambulation with RW; recently limited to household distances ADL's / Homemaking Assistance Needed: independent with BADLs; assistance for IADLs     PT Goals (current goals can now be found in the care plan section) Acute Rehab PT Goals Patient Stated Goal: Go get stronger before going home. PT Goal Formulation: With patient Time For Goal Achievement: 04/22/20 Potential to Achieve Goals: Fair    Frequency    Min 3X/week      PT Plan      Co-evaluation              AM-PAC PT "6 Clicks" Mobility   Outcome Measure  Help needed turning from your back to your side while in a flat bed without using bedrails?: A Lot Help needed moving from lying on your back to sitting on the side of a flat bed without using bedrails?: A Lot Help needed moving to and from a bed to a chair (including a wheelchair)?: A Little Help needed standing up from  a chair using your arms (e.g., wheelchair or bedside chair)?: A Lot Help needed to walk in hospital room?: A Little Help needed climbing 3-5 steps with a railing? : Total 6 Click Score: 13    End of Session Equipment Utilized During Treatment: Oxygen Activity Tolerance: Patient limited by fatigue Patient left: with call bell/phone within reach;in bed;with bed alarm set Nurse Communication: Mobility status PT Visit Diagnosis: Unsteadiness on feet (R26.81);Other abnormalities of gait and mobility (R26.89);Muscle weakness (generalized) (M62.81);Difficulty in walking, not elsewhere classified (R26.2)     Time: 1510-1540 PT Time Calculation (min) (ACUTE ONLY): 30 min  Charges:  $Therapeutic Exercise: 8-22 mins $Therapeutic Activity: 8-22 mins                     3:43 PM, 04/09/20 M. Shary Decamp, PT, DPT Physical Therapist- [place] Office Number: [phone]

## 2020-04-09 NOTE — TOC Progression Note (Signed)
Transition of Care [facility]) - Progression Note    Patient Details  Name: HIMMAT ENBERG MRN: [id number] Date of Birth: 03-22-1940  Transition of Care [facility]) CM/SW Contact  Salome Arnt, [place] Phone Number: 04/09/2020, 10:58 AM  Clinical Narrative:  LCSW followed up with pt's daughter. She states she called DSS yesterday and left voicemail, but has not heard back. LCSW encouraged her to call again today. Per Vaughan Basta with [place], pt is active with PT and RN. Discussed with daughter adding SW to assist with Medicaid/possible long term placement. Daughter agreeable. MD and Vaughan Basta updated. TOC will continue to follow.      Expected Discharge Plan: [place] Barriers to Discharge: Continued Medical Work up  Expected Discharge Plan and Services Expected Discharge Plan: [place] In-house Referral: Clinical Social Work Discharge Planning Services: NA Post Acute Care Choice: [place] arrangements for the past 2 months: Mobile Home                 DME Arranged: N/A DME Agency: NA       HH Arranged: NA HH Agency: NA         Social Determinants of Health (SDOH) Interventions    Readmission Risk Interventions No flowsheet data found.

## 2020-04-09 NOTE — Progress Notes (Addendum)
PROGRESS NOTE    [name]  HYQ:[id number] DOB: 1940/10/15 DOA: 04/06/2020 PCP: Martha Clan, MD   Brief Narrative:  Per HPI: [name] a 80 y.o.malewith medical history significant forparoxysmal atrial fibrillation on apixaban, history of CAD, COPD, CVA x2, dysphagia, anxiety disorder, carotid artery disease, morbid obesity, GERD, hypertensionandmorbid obesity department who presents to the emergency department due to shortness of breath and increased work of breathing. Patient was only able to provide limited history, respiratory history was obtained from ED physician and ED medical record. Patient states that he has had slowly and progressive shortness of breath within last 3 months and that the shortness of breath worsened within last several days. He states that he ambulates with a Dafoe and easily gets short of breath after a few steps within last few days. Apparently, EMS was activated and on arrival of EMS, patient was started on a nonrebreather due to hypoxia. Steroids andalbuterol breathing treatment was provided in route. He denies chest pain, fever, chills, nausea or vomiting.  -Patient was admitted with acute hypoxemic respiratory failure secondary to COPD, but is also noted to have some signs of ongoing volume overload in the setting of CHF for which IV Lasix has been started today. Will require some ongoing diuresis and weaning off of oxygen (on room air at home) prior to discharge to home with home health.  Assessment & Plan:   Principal Problem:   COPD with acute bronchitis (HCC) Active Problems:   HLD (hyperlipidemia)   Essential hypertension   GASTROESOPHAGEAL REFLUX DISEASE   Coronary artery disease involving native coronary artery of native heart without angina pectoris   Chronic atrial fibrillation (HCC)   Acute respiratory failure with hypoxia (HCC)   Candidal intertrigo   History of stroke   Chronic systolic CHF (congestive heart  failure) (HCC)   Prolonged QT interval   Hypothyroidism   Acute respiratory failure with hypoxia-multifactorial With COPD and CHF; currently on 4L [place] and is on RA at home (may need O2 on dc however) Continuealbuterol, Mucinex Change Solumedrol to prednisone Continue Protonix to prevent steroid-induced ulcer Continue incentive spirometry and flutter valve Continue supplemental oxygen to maintain O2 sat > 92% with plan to wean patient off oxygen as tolerated  Acute on chronic HFpEF Patient with pitting edema to back and flanks No edema to lower extremities due to [place] wrap which will be redressed with ACE bandages today Continue Lasix per home regimen (monitor for worsening creatinine) Continue metoprolol per home regimen Cardiogram done on 06/26/2019 showed LVEF of 45 to 50% with LV demonstrating regional wall motion abnormalities, severe hypokinesis of the basal to mid inferior wall and the basal inferoseptal wall  Acute kidney injury-fluctuating BUN/creatinine 50/1.8(baseline creatinine at 1.5-1.6),creatinine was 2.12 about 4 months ago Renally adjust medications, avoid nephrotoxic agents/dehydration/hypotension Monitor carefully with ongoing diuresis  Candidal intertrigo Patient presents with intertrigo in the abdominal and groin area Continue nystatin powder  Borderline prolonged QTc(481 ms) Avoid QT prolonging drugs Magnesium levelok Check am labs  Hypothyroidism Continue Synthroid  Chronic atrial fibrillation/history of stroke Continue metoprolol and apixaban  CAD Continue metoprolol  Essential hypertension-controlled Continue metoprolol and now on IV Lasix  Hyperlipidemia Continue Crestor  Morbid obesity BMI 43.62 kg/m2 Lifestyle changes in outpatient setting   DVT prophylaxis:Eliquis Code Status:DNR Family Communication:Discussed with daughter on 2/15 Disposition Plan: Status is: Inpatient  Remains inpatient appropriate  because:Ongoing diagnostic testing needed not appropriate for outpatient work up, IV treatments appropriate due to intensity of illness or  inability to take PO and Inpatient level of care appropriate due to severity of illness   Dispo: The patient is from:Home Anticipated d/c is IH:KVQQ with home health Anticipated d/c date is: 1-3 days Patient currently is medically stable to d/c. Ongoing diuresis required. Difficult to place patient No   Consultants:  None  Procedures:  See below  Antimicrobials:   None  Subjective: Patient seen and evaluated today with no new acute complaints or concerns. No acute concerns or events noted overnight.  Objective: Vitals:   04/08/20 2123 04/09/20 0522 04/09/20 0738 04/09/20 1028  BP: (!) 109/50 (!) 135/54  (!) 127/54  Pulse: 82 65  64  Resp: 18 19    Temp: 97.6 F (36.4 C) 97.8 F (36.6 C)    TempSrc: Oral Oral    SpO2: 93% 95% 97%   Weight:      Height:        Intake/Output Summary (Last 24 hours) at 04/09/2020 1410 Last data filed at 04/09/2020 0500 Gross per 24 hour  Intake 480 ml  Output 2050 ml  Net -1570 ml   Filed Weights   04/06/20 1805  Weight: (!) 137.9 kg    Examination:  General exam: Appears calm and comfortable, obese Respiratory system: Clear to auscultation. Respiratory effort normal. On 3L [place] Cardiovascular system: S1 & S2 heard, RRR.  Gastrointestinal system: Abdomen is soft Central nervous system: Alert and awake Extremities: No edema due to wraps; pitting edema to flanks and back Skin: No significant lesions noted Psychiatry: Flat affect.    Data Reviewed: I have personally reviewed following labs and imaging studies  CBC: Recent Labs  Lab 04/06/20 1828 04/07/20 0940 04/08/20 0558 04/09/20 0500  WBC 7.8 11.7* 13.5* 11.5*  NEUTROABS 6.6  --   --   --   HGB 11.8* 11.9* 11.4* 11.6*  HCT 38.9* 39.0 37.9* 38.3*  MCV 86.1 84.8 85.9  86.8  PLT 251 250 264 264   Basic Metabolic Panel: Recent Labs  Lab 04/06/20 1828 04/07/20 0940 04/08/20 0558 04/09/20 0500  NA 138 139 139 139  K 3.9 4.2 4.5 3.7  CL 103 104 103 102  CO2 25 24 24 27   GLUCOSE 131* 156* 134* 105*  BUN 39* 36* 43* 50*  CREATININE 2.01* 1.82* 1.94* 1.80*  CALCIUM 9.0 9.6 9.4 9.1  MG  --  2.5* 2.6* 2.6*  PHOS  --  2.6  --   --    GFR: Estimated Creatinine Clearance: 45.1 mL/min (A) (by C-G formula based on SCr of 1.8 mg/dL (H)). Liver Function Tests: Recent Labs  Lab 04/06/20 1828 04/07/20 0940  AST 26 28  ALT 17 19  ALKPHOS 80 73  BILITOT 0.7 0.7  PROT 7.0 7.2  ALBUMIN 3.5 3.3*   No results for input(s): LIPASE, AMYLASE in the last 168 hours. No results for input(s): AMMONIA in the last 168 hours. Coagulation Profile: Recent Labs  Lab 04/07/20 0940  INR 1.2   Cardiac Enzymes: No results for input(s): CKTOTAL, CKMB, CKMBINDEX, TROPONINI in the last 168 hours. BNP (last 3 results) Recent Labs    12/04/19 1015  PROBNP 1,167*   HbA1C: No results for input(s): HGBA1C in the last 72 hours. CBG: No results for input(s): GLUCAP in the last 168 hours. Lipid Profile: No results for input(s): CHOL, HDL, LDLCALC, TRIG, CHOLHDL, LDLDIRECT in the last 72 hours. Thyroid Function Tests: No results for input(s): TSH, T4TOTAL, FREET4, T3FREE, THYROIDAB in the last 72 hours. Anemia Panel: No  results for input(s): VITAMINB12, FOLATE, FERRITIN, TIBC, IRON, RETICCTPCT in the last 72 hours. Sepsis Labs: Recent Labs  Lab 04/08/20 0558  PROCALCITON <0.10    Recent Results (from the past 240 hour(s))  Resp Panel by RT-PCR (Flu A&B, Covid) Nasopharyngeal Swab     Status: None   Collection Time: 04/06/20 10:12 PM   Specimen: Nasopharyngeal Swab; Nasopharyngeal(NP) swabs in vial transport medium  Result Value Ref Range Status   SARS Coronavirus 2 by RT PCR NEGATIVE NEGATIVE Final    Comment: (NOTE) SARS-CoV-2 target nucleic acids are NOT  DETECTED.  The SARS-CoV-2 RNA is generally detectable in upper respiratory specimens during the acute phase of infection. The lowest concentration of SARS-CoV-2 viral copies this assay can detect is 138 copies/mL. A negative result does not preclude SARS-Cov-2 infection and should not be used as the sole basis for treatment or other patient management decisions. A negative result may occur with  improper specimen collection/handling, submission of specimen other than nasopharyngeal swab, presence of viral mutation(s) within the areas targeted by this assay, and inadequate number of viral copies(<138 copies/mL). A negative result must be combined with clinical observations, patient history, and epidemiological information. The expected result is Negative.  Fact Sheet for Patients:  BloggerCourse.com  Fact Sheet for Healthcare Providers:  SeriousBroker.it  This test is no t yet approved or cleared by the [place] FDA and  has been authorized for detection and/or diagnosis of SARS-CoV-2 by FDA under an Emergency Use Authorization (EUA). This EUA will remain  in effect (meaning this test can be used) for the duration of the COVID-19 declaration under Section 564(b)(1) of the Act, 21 U.S.C.section 360bbb-3(b)(1), unless the authorization is terminated  or revoked sooner.       Influenza A by PCR NEGATIVE NEGATIVE Final   Influenza B by PCR NEGATIVE NEGATIVE Final    Comment: (NOTE) The Xpert Xpress SARS-CoV-2/FLU/RSV plus assay is intended as an aid in the diagnosis of influenza from Nasopharyngeal swab specimens and should not be used as a sole basis for treatment. Nasal washings and aspirates are unacceptable for Xpert Xpress SARS-CoV-2/FLU/RSV testing.  Fact Sheet for Patients: BloggerCourse.com  Fact Sheet for Healthcare Providers: SeriousBroker.it  This test is not yet  approved or cleared by the [place] FDA and has been authorized for detection and/or diagnosis of SARS-CoV-2 by FDA under an Emergency Use Authorization (EUA). This EUA will remain in effect (meaning this test can be used) for the duration of the COVID-19 declaration under Section 564(b)(1) of the Act, 21 U.S.C. section 360bbb-3(b)(1), unless the authorization is terminated or revoked.  Performed at [facility], [address] [place], [place] [address]          Radiology Studies: ECHOCARDIOGRAM COMPLETE  Result Date: 04/08/2020    ECHOCARDIOGRAM REPORT   Patient Name:   [name] Date of Exam: 04/08/2020 Medical Rec #:  [id number]        Height:       70.0 in Accession #:    [id number]       Weight:       304.0 lb Date of Birth:  [birth year]        BSA:          2.494 m Patient Age:    79 years         BP:           110/59 mmHg Patient Gender: M  HR:           75 bpm. [facility]am [facility] Procedure: 2D Echo Indications:    Dyspnea R06.00  History:        Patient has prior history of Echocardiogram examinations, most                 recent 06/26/2019. CHF, COPD, Arrythmias:Atrial Fibrillation,                 Signs/Symptoms:Chest Pain; Risk Factors:Hypertension,                 Dyslipidemia and Former Smoker. GERD.  Sonographer:    Jeryl Columbia RDCS (AE) Referring Phys: [phone] Lamont Dowdy [facility] IMPRESSIONS  1. Mild diffuse hypokinesis in afib slighlty worse in septum . Left ventricular ejection fraction, by estimation, is 45 to 50%. The left ventricle has mildly decreased function. The left ventricle has no regional wall motion abnormalities. There is mild  left ventricular hypertrophy. Left ventricular diastolic parameters are indeterminate.  2. Right ventricular systolic function is normal. The right ventricular size is normal.  3. The mitral valve is normal in structure. Trivial mitral valve regurgitation. No evidence of mitral stenosis.  4. The aortic valve is  tricuspid. Aortic valve regurgitation is trivial. Mild to moderate aortic valve sclerosis/calcification is present, without any evidence of aortic stenosis.  5. The inferior vena cava is normal in size with greater than 50% respiratory variability, suggesting right atrial pressure of 3 mmHg. FINDINGS  Left Ventricle: Mild diffuse hypokinesis in afib slighlty worse in septum. Left ventricular ejection fraction, by estimation, is 45 to 50%. The left ventricle has mildly decreased function. The left ventricle has no regional wall motion abnormalities. The left ventricular internal cavity size was normal in size. There is mild left ventricular hypertrophy. Left ventricular diastolic parameters are indeterminate. Right Ventricle: The right ventricular size is normal. No increase in right ventricular wall thickness. Right ventricular systolic function is normal. Left Atrium: Left atrial size was normal in size. Right Atrium: Right atrial size was normal in size. Pericardium: There is no evidence of pericardial effusion. Mitral Valve: The mitral valve is normal in structure. There is mild thickening of the mitral valve leaflet(s). There is mild calcification of the mitral valve leaflet(s). Mild mitral annular calcification. Trivial mitral valve regurgitation. No evidence  of mitral valve stenosis. Tricuspid Valve: The tricuspid valve is normal in structure. Tricuspid valve regurgitation is not demonstrated. No evidence of tricuspid stenosis. Aortic Valve: The aortic valve is tricuspid. Aortic valve regurgitation is trivial. Aortic regurgitation PHT measures 556 msec. Mild to moderate aortic valve sclerosis/calcification is present, without any evidence of aortic stenosis. Pulmonic Valve: The pulmonic valve was normal in structure. Pulmonic valve regurgitation is not visualized. No evidence of pulmonic stenosis. Aorta: The aortic root is normal in size and structure. Venous: The inferior vena cava is normal in size with  greater than 50% respiratory variability, suggesting right atrial pressure of 3 mmHg. IAS/Shunts: No atrial level shunt detected by color flow Doppler.  LEFT VENTRICLE PLAX 2D LVIDd:         3.70 cm  Diastology LVIDs:         3.25 cm  LV e' medial:    7.72 cm/s LV PW:         1.29 cm  LV E/e' medial:  16.2 LV IVS:        1.39 cm  LV e' lateral:   9.46 cm/s LVOT diam:  1.90 cm  LV E/e' lateral: 13.2 LVOT Area:     2.84 cm  RIGHT VENTRICLE RV S prime:     10.60 cm/s TAPSE (M-mode): 1.8 cm LEFT ATRIUM           Index       RIGHT ATRIUM           Index LA diam:      2.90 cm 1.16 cm/m  RA Area:     14.70 cm LA Vol (A2C): 45.2 ml 18.12 ml/m RA Volume:   39.50 ml  15.84 ml/m LA Vol (A4C): 57.2 ml 22.94 ml/m  AORTIC VALVE AI PHT:      556 msec  AORTA Ao Root diam: 3.60 cm MITRAL VALVE                TRICUSPID VALVE MV Area (PHT): 3.63 cm     TR Peak grad:   28.1 mmHg MV Decel Time: 209 msec     TR Vmax:        265.00 cm/s MV E velocity: 125.00 cm/s MV A velocity: 38.60 cm/s   SHUNTS MV E/A ratio:  3.24         Systemic Diam: 1.90 cm Charlton Haws MD Electronically signed by Charlton Haws MD Signature Date/Time: 04/08/2020/11:39:28 AM    Final         Scheduled Meds: . albuterol  2.5 mg Nebulization BID  . apixaban  2.5 mg Oral BID  . dextromethorphan-guaiFENesin  1 tablet Oral BID  . furosemide  80 mg Intravenous Q12H  . levothyroxine  50 mcg Oral Daily  . metoprolol tartrate  25 mg Oral BID  . nystatin   Topical BID  . pantoprazole (PROTONIX) IV  40 mg Intravenous Q24H  . [START ON 04/10/2020] predniSONE  40 mg Oral Q breakfast  . rosuvastatin  20 mg Oral QHS    LOS: 2 days    Time spent: 35 minutes    Tecia Cinnamon Hoover Brunette, DO [facility]  If 7PM-7AM, please contact night-coverage [email or web address] 04/09/2020, 2:10 PM

## 2020-04-10 DIAGNOSIS — J440 Chronic obstructive pulmonary disease with acute lower respiratory infection: Secondary | ICD-10-CM | POA: Diagnosis not present

## 2020-04-10 DIAGNOSIS — I482 Chronic atrial fibrillation, unspecified: Secondary | ICD-10-CM | POA: Diagnosis not present

## 2020-04-10 DIAGNOSIS — B372 Candidiasis of skin and nail: Secondary | ICD-10-CM | POA: Diagnosis not present

## 2020-04-10 DIAGNOSIS — J9601 Acute respiratory failure with hypoxia: Secondary | ICD-10-CM | POA: Diagnosis not present

## 2020-04-10 LAB — CBC
HCT: 38.1 % — ABNORMAL LOW (ref 39.0–52.0)
Hemoglobin: 11.6 g/dL — ABNORMAL LOW (ref 13.0–17.0)
MCH: 26.1 pg (ref 26.0–34.0)
MCHC: 30.4 g/dL (ref 30.0–36.0)
MCV: 85.8 fL (ref 80.0–100.0)
Platelets: 252 10*3/uL (ref 150–400)
RBC: 4.44 MIL/uL (ref 4.22–5.81)
RDW: 18.1 % — ABNORMAL HIGH (ref 11.5–15.5)
WBC: 9 10*3/uL (ref 4.0–10.5)
nRBC: 0 % (ref 0.0–0.2)

## 2020-04-10 LAB — BASIC METABOLIC PANEL
Anion gap: 10 (ref 5–15)
BUN: 52 mg/dL — ABNORMAL HIGH (ref 8–23)
CO2: 28 mmol/L (ref 22–32)
Calcium: 8.8 mg/dL — ABNORMAL LOW (ref 8.9–10.3)
Chloride: 101 mmol/L (ref 98–111)
Creatinine, Ser: 2.3 mg/dL — ABNORMAL HIGH (ref 0.61–1.24)
GFR, Estimated: 28 mL/min — ABNORMAL LOW (ref >60)
Glucose, Bld: 94 mg/dL (ref 70–99)
Potassium: 3.3 mmol/L — ABNORMAL LOW (ref 3.5–5.1)
Sodium: 139 mmol/L (ref 135–145)

## 2020-04-10 LAB — MAGNESIUM: Magnesium: 2.3 mg/dL (ref 1.7–2.4)

## 2020-04-10 MED ORDER — NYSTATIN 100000 UNIT/GM EX POWD: Freq: Two times a day (BID) | CUTANEOUS | 0 refills | Status: AC

## 2020-04-10 MED ORDER — PREDNISONE 20 MG PO TABS: 40.0000 mg | ORAL_TABLET | Freq: Every day | ORAL | 0 refills | Status: AC

## 2020-04-10 MED ORDER — FAMOTIDINE 20 MG PO TABS
20.0000 mg | ORAL_TABLET | Freq: Once | ORAL | Status: AC
  Administered 2020-04-10: 20 mg via ORAL
  Filled 2020-04-10: qty 1

## 2020-04-10 MED ORDER — PROCHLORPERAZINE EDISYLATE 10 MG/2ML IJ SOLN
10.0000 mg | INTRAMUSCULAR | Status: DC | PRN
  Administered 2020-04-10: 10 mg via INTRAVENOUS
  Filled 2020-04-10: qty 2

## 2020-04-10 MED ORDER — ALUM & MAG HYDROXIDE-SIMETH 200-200-20 MG/5ML PO SUSP: 30.0000 mL | Freq: Once | ORAL | Status: DC

## 2020-04-10 MED ORDER — ALUM & MAG HYDROXIDE-SIMETH 200-200-20 MG/5ML PO SUSP
15.0000 mL | Freq: Once | ORAL | Status: AC
  Administered 2020-04-10: 15 mL via ORAL
  Filled 2020-04-10: qty 30

## 2020-04-10 MED ORDER — ISOSORBIDE MONONITRATE ER 30 MG PO TB24: 15.0000 mg | ORAL_TABLET | Freq: Every day | ORAL | 0 refills | Status: DC

## 2020-04-10 NOTE — Progress Notes (Signed)
Rodney from [place] delivered pt portable O2 concentrator and O2 tank and educated pt on use.

## 2020-04-10 NOTE — TOC Transition Note (Signed)
Transition of Care [facility]) - CM/SW Discharge Note  Patient Details  Name: SIM CHOQUETTE MRN: [id number] Date of Birth: Mar 06, 1940  Transition of Care [facility]) CM/SW Contact:  Sherie Don, LCSW Phone Number: 04/10/2020, 3:36 PM  Clinical Narrative: Patient is ready for discharge. HH orders are in; social work has been added to assist the family with applying for LTC Medicaid and navigating placement. Linda with [facility] updated. Oxygen saturation note is in; patient agreeable to DME referral to [employer]. Referral made to [facility] with [facility]. [employer] to verify payment with daughter.  Daughter continues to express concerns about patient returning home even though SNF has not been approved as he has failed rehab several times and the patient/family needs to pursue LTC Medicaid in the outpatient setting. CSW met with patient to discuss IM and [place]. Patient stated he does not want to file the appeal due to financial concerns about losing the appeal. Patient verbalized understanding that he cannot remain in the hospital awaiting Medicaid approval and LTC placement. RN updated CSW that daughter informed her the application for LTC Medicaid was submitted.  CSW spoke with patient and daughter about [facility] referral to also provide some OP assistance. [facility] order placed. Patient will need transportation home, but has a friend that can unlock his home for him after his O2 has been delivered to the hospital.  Final next level of care: [place] Barriers to Discharge: Barriers Resolved  Patient Goals and CMS Choice Patient states their goals for this hospitalization and ongoing recovery are:: Discharge home and continue to apply for LTC Medicaid CMS Medicare.gov Compare Post Acute Care list provided to:: Patient Choice offered to / list presented to : Patient  Discharge Plan and Services In-house Referral: Clinical Social Work Discharge Planning Services: NA Post Acute Care Choice: Home Health           DME Arranged: Oxygen DME Agency: [facility] Date DME Agency Contacted: 04/10/20 Time DME Agency Contacted: [phone] Representative spoke with at DME Agency: [place]: PT,RN,Social Work [place]: [place] ([place]) Date [place]: 04/10/20 Time [place]: [id number] Representative spoke with at [place]: Vaughan Basta  Readmission Risk Interventions No flowsheet data found.

## 2020-04-10 NOTE — Care Management Important Message (Signed)
Important Message  Patient Details  Name: [name] MRN: [id number] Date of Birth: Jun 22, 1940   Medicare Important Message Given:  Yes     Sherie Don, LCSW 04/10/2020, 2:34 PM

## 2020-04-10 NOTE — Discharge Instructions (Signed)
Chronic Obstructive Pulmonary Disease Exacerbation  Chronic obstructive pulmonary disease (COPD) is a long-term (chronic) condition that affects the lungs. COPD is a general term that can be used to describe many different lung problems that cause lung swelling (inflammation) and limit airflow, including chronic bronchitis and emphysema. COPD exacerbations are episodes when breathing symptoms become much worse and require extra treatment. COPD exacerbations are usually caused by infections. Without treatment, COPD exacerbations can be severe and even life threatening. Frequent COPD exacerbations can cause further damage to the lungs. What are the causes? This condition may be caused by:  Respiratory infections, including viral and bacterial infections.  Exposure to smoke.  Exposure to air pollution, chemical fumes, or dust.  Things that give you an allergic reaction (allergens).  Not taking your usual COPD medicines as directed.  Underlying medical problems, such as congestive heart failure or infections not involving the lungs. In many cases, the cause (trigger) of this condition is not known. What increases the risk? The following factors may make you more likely to develop this condition:  Smoking cigarettes.  Old age.  Frequent prior COPD exacerbations. What are the signs or symptoms? Symptoms of this condition include:  Increased coughing.  Increased production of mucus from your lungs (sputum).  Increased wheezing.  Increased shortness of breath.  Rapid or labored breathing.  Chest tightness.  Less energy than usual.  Sleep disruption from symptoms.  Confusion or increased sleepiness. Often these symptoms happen or get worse even with the use of medicines. How is this diagnosed? This condition is diagnosed based on:  Your medical history.  A physical exam. You may also have tests, including:  A chest X-ray.  Blood tests.  Lung (pulmonary) function  tests. How is this treated? Treatment for this condition depends on the severity and cause of the symptoms. You may need to be admitted to a hospital for treatment. Some of the treatments commonly used to treat COPD exacerbations are:  Antibiotic medicines. These may be used for severe exacerbations caused by a lung infection, such as pneumonia.  Bronchodilators. These are inhaled medicines that expand the air passages and allow increased airflow.  Steroid medicines. These act to reduce inflammation in the airways. They may be given with an inhaler, taken by mouth, or given through an IV tube inserted into one of your veins.  Supplemental oxygen therapy.  Airway clearing techniques, such as noninvasive ventilation (NIV) and positive expiratory pressure (PEP). These provide respiratory support through a mask or other noninvasive device. An example of this would be using a continuous positive airway pressure (CPAP) machine to improve delivery of oxygen into your lungs. Follow these instructions at home: Medicines  Take over-the-counter and prescription medicines only as told by your health care provider. It is important to use correct technique with inhaled medicines.  If you were prescribed an antibiotic medicine or oral steroid, take it as told by your health care provider. Do not stop taking the medicine even if you start to feel better. Lifestyle  Eat a healthy diet.  Exercise regularly.  Get plenty of sleep.  Avoid exposure to all substances that irritate the airway, especially to tobacco smoke.  Wash your hands often with soap and water to reduce the risk of infection. If soap and water are not available, use hand sanitizer.  During flu season, avoid enclosed spaces that are crowded with people. General instructions  Drink enough fluid to keep your urine clear or pale yellow (unless you have a medical   condition that requires fluid restriction).  Use a cool mist vaporizer. This  humidifies the air and makes it easier for you to clear your chest when you cough.  If you have a home nebulizer and oxygen, continue to use them as told by your health care provider.  Keep all follow-up visits as told by your health care provider. This is important. How is this prevented?  Stay up-to-date on pneumococcal and influenza (flu) vaccines. A flu shot is recommended every year to help prevent exacerbations.  Do not use any products that contain nicotine or tobacco, such as cigarettes and e-cigarettes. Quitting smoking is very important in preventing COPD from getting worse and in preventing exacerbations from happening as often. If you need help quitting, ask your health care provider.  Follow all instructions for pulmonary rehabilitation after a recent exacerbation. This can help prevent future exacerbations.  Work with your health care provider to develop and follow an action plan. This tells you what steps to take when you experience certain symptoms. Contact a health care provider if:  You have a worsening of your regular COPD symptoms. Get help right away if:  You have worsening shortness of breath, even when resting.  You have trouble talking.  You have severe chest pain.  You cough up blood.  You have a fever.  You have weakness, vomit repeatedly, or faint.  You feel confused.  You are not able to sleep because of your symptoms.  You have trouble doing daily activities. Summary  COPD exacerbations are episodes when breathing symptoms become much worse and require extra treatment above your normal treatment.  Exacerbations can be severe and even life threatening. Frequent COPD exacerbations can cause further damage to your lungs.  COPD exacerbations are usually triggered by infections such as the flu, colds, and even pneumonia.  Treatment for this condition depends on the severity and cause of the symptoms. You may need to be admitted to a hospital for  treatment.  Quitting smoking is very important to prevent COPD from getting worse and to prevent exacerbations from happening as often. This information is not intended to replace advice given to you by your health care provider. Make sure you discuss any questions you have with your health care provider. Document Revised: 01/22/2017 Document Reviewed: 03/16/2016 Elsevier Patient Education  2021 [place].   IMPORTANT INFORMATION: PAY CLOSE ATTENTION   PHYSICIAN DISCHARGE INSTRUCTIONS  Follow with Primary care provider  Marton Redwood, MD  and other consultants as instructed by your Hospitalist Physician  [place] IF SYMPTOMS COME BACK, WORSEN OR NEW PROBLEM DEVELOPS   Please note: You were cared for by a hospitalist during your hospital stay. Every effort will be made to forward records to your primary care provider.  You can request that your primary care provider send for your hospital records if they have not received them.  Once you are discharged, your primary care physician will handle any further medical issues. Please note that NO REFILLS for any discharge medications will be authorized once you are discharged, as it is imperative that you return to your primary care physician (or establish a relationship with a primary care physician if you do not have one) for your post hospital discharge needs so that they can reassess your need for medications and monitor your lab values.  Please get a complete blood count and chemistry panel checked by your Primary MD at your next visit, and again as instructed  by your Primary MD.  Get Medicines reviewed and adjusted: Please take all your medications with you for your next visit with your Primary MD  Laboratory/radiological data: Please request your Primary MD to go over all hospital tests and procedure/radiological results at the follow up, please ask your primary care provider to get all Hospital  records sent to his/her office.  In some cases, they will be blood work, cultures and biopsy results pending at the time of your discharge. Please request that your primary care provider follow up on these results.  If you are diabetic, please bring your blood sugar readings with you to your follow up appointment with primary care.    Please call and make your follow up appointments as soon as possible.    Also Note the following: If you experience worsening of your admission symptoms, develop shortness of breath, life threatening emergency, suicidal or homicidal thoughts you must seek medical attention immediately by calling 911 or calling your MD immediately  if symptoms less severe.  You must read complete instructions/literature along with all the possible adverse reactions/side effects for all the Medicines you take and that have been prescribed to you. Take any new Medicines after you have completely understood and accpet all the possible adverse reactions/side effects.   Do not drive when taking Pain medications or sleeping medications (Benzodiazepines)  Do not take more than prescribed Pain, Sleep and Anxiety Medications. It is not advisable to combine anxiety,sleep and pain medications without talking with your primary care practitioner  Special Instructions: If you have smoked or chewed Tobacco  in the last 2 yrs please stop smoking, stop any regular Alcohol  and or any Recreational drug use.  Wear Seat belts while driving.  Do not drive if taking any narcotic, mind altering or controlled substances or recreational drugs or alcohol.

## 2020-04-10 NOTE — Progress Notes (Addendum)
SATURATION QUALIFICATIONS: (This note is used to comply with regulatory documentation for home oxygen)  Patient Saturations on Room Air at Rest = 88%  Patient Saturations on Room Air while Ambulating = 87%  Patient Saturations on 2Liters of oxygen while Ambulating = 93%  Please briefly explain why patient needs home oxygen:Pt taken off O2 and destated to 88%. Pt stood to pivot to bed and O2 dropped to 87%. Pt placed back on oxygen 2L and pivoted back to chair O2 at 93%. Resting in chair the patient O2 was 94% on 2L.

## 2020-04-10 NOTE — Progress Notes (Signed)
Pt complained of feeling bloated and nausea. Provider notified, orders given and will re-assess

## 2020-04-10 NOTE — Progress Notes (Signed)
Physical Therapy Treatment Patient Details Name: [name] MRN: [id number] DOB: 1940-08-09 Today's Date: 04/10/2020    History of Present Illness [name] is a 80 y.o. male with medical history significant forparoxysmal atrial fibrillation on apixaban, history of CAD, COPD, CVA x2, dysphagia, anxiety disorder, carotid artery disease, morbid obesity, GERD, hypertension and morbid obesity department who presents to the emergency department due to shortness of breath and increased work of breathing.  Patient was only able to provide limited history, respiratory history was obtained from ED physician and ED medical record.  Patient states that he has had slowly and progressive shortness of breath within last 3 months and that the shortness of breath worsened within last several days.  He states that he ambulates with a [place] and easily gets short of breath after a few steps within last few days.  Apparently, EMS was activated and on arrival of EMS, patient was started on a nonrebreather due to hypoxia .  Steroids and albuterol breathing treatment was provided in route.  He denies chest pain, fever, chills, nausea or vomiting.    PT Comments    Patient completes bed mobility with slow, labored movements requiring assist for uprighting trunk, LE movement, and scooting to EOB. Patient with fair sitting balance requiring UE support and good sitting tolerance, Patient completes seated exercises and c/o fatigue during. Patient requires 2 attempts to transfer to standing with RW from elevated bed with mod assist secondary to LE weakness. Patient ambulates with slow, labored cadence to chair at bedside. Patient O2 sat monitored throughout session at 93-98 on 2L. Patient ends session with nursing present seated in chair. Patient will benefit from continued physical therapy in hospital and recommended venue below to increase strength, balance, endurance for safe ADLs and gait.   Follow Up  Recommendations  SNF;Supervision/Assistance - 24 hour     Equipment Recommendations  None recommended by PT    Recommendations for Other Services       Precautions / Restrictions Precautions Precautions: Fall Restrictions Weight Bearing Restrictions: No    Mobility  Bed Mobility Overal bed mobility: Needs Assistance Bed Mobility: Supine to Sit;Sit to Supine     Supine to sit: Mod assist;HOB elevated     General bed mobility comments: mod assist for trunk and scooting to edge of bed. assist to upright trunk and for LE movement    Transfers Overall transfer level: Needs assistance Equipment used: Rolling Seg[place] (2 wheeled) Transfers: Sit to/from [facility] Sit to Stand: From elevated surface;Mod assist Stand pivot transfers: Mod assist       General transfer comment: mod assist from elevated bed, takes 2 attempts and cueing for placement of hands and sequencing  Ambulation/Gait Ambulation/Gait assistance: Min guard;Min assist Gait Distance (Feet): 4 Feet Assistive device: Rolling [place] (2 wheeled) Gait Pattern/deviations: Step-through pattern;Decreased step length - right;Decreased step length - left;Decreased stride length;Trunk flexed;Wide base of support Gait velocity: decreased   General Gait Details: slow, labored ambulation with RW and 2 LPM O2, complaints of shortness of breathe; dyspnea on exertion noted; limited by fatigue; ambulates to chair at bedside   Stairs             Wheelchair Mobility    Modified Rankin (Stroke Patients Only)       Balance Overall balance assessment: Needs assistance Sitting-balance support: Bilateral upper extremity supported;Feet supported Sitting balance-Leahy Scale: Fair     Standing balance support: Bilateral upper extremity supported;During functional activity Standing balance-Leahy Scale: Fair  Standing balance comment: fair with RW; heavy use of upper extremities                             Cognition Arousal/Alertness: Awake/alert Behavior During Therapy: WFL for tasks assessed/performed Overall Cognitive Status: Within Functional Limits for tasks assessed                                        Exercises General Exercises - Lower Extremity Ankle Circles/Pumps: AROM;Both;20 reps Quad Sets: Strengthening;Both;20 reps Long Arc Quad: Strengthening;Both;20 reps Hip Flexion/Marching: AROM;Both;10 reps;Seated    General Comments        Pertinent Vitals/Pain Pain Assessment: No/denies pain    Home Living                      Prior Function            PT Goals (current goals can now be found in the care plan section) Acute Rehab PT Goals Patient Stated Goal: Go get stronger before going home. PT Goal Formulation: With patient Time For Goal Achievement: 04/22/20 Potential to Achieve Goals: Fair Progress towards PT goals: Progressing toward goals    Frequency    Min 3X/week      PT Plan      Co-evaluation              AM-PAC PT "6 Clicks" Mobility   Outcome Measure  Help needed turning from your back to your side while in a flat bed without using bedrails?: A Lot Help needed moving from lying on your back to sitting on the side of a flat bed without using bedrails?: A Lot Help needed moving to and from a bed to a chair (including a wheelchair)?: A Little Help needed standing up from a chair using your arms (e.g., wheelchair or bedside chair)?: A Lot Help needed to walk in hospital room?: A Little Help needed climbing 3-5 steps with a railing? : Total 6 Click Score: 13    End of Session Equipment Utilized During Treatment: Oxygen;Gait belt Activity Tolerance: Patient limited by fatigue Patient left: with call bell/phone within reach;in bed;with bed alarm set Nurse Communication: Mobility status PT Visit Diagnosis: Unsteadiness on feet (R26.81);Other abnormalities of gait and mobility (R26.89);Muscle  weakness (generalized) (M62.81);Difficulty in walking, not elsewhere classified (R26.2)     Time: 5638-9373 PT Time Calculation (min) (ACUTE ONLY): 21 min  Charges:  $Therapeutic Activity: 8-22 mins                     11:07 AM, 04/10/20 Mearl Latin PT, DPT Physical Therapist at [facility]

## 2020-04-10 NOTE — Progress Notes (Signed)
Pt transported in wheelchair to the transport that was arranged. Pt daughter was called and given the discharge instructions and to verify there will be someone at the house for the pt. Pt was transported with O2 attached to his O2 concentrator, O2 tank, dc paperwork and personal belongings.

## 2020-04-10 NOTE — Discharge Summary (Signed)
Physician Discharge Summary  [name] NOB:[id number] DOB: 06/03/1940 DOA: 04/06/2020  PCP: Marton Redwood, MD  Admit date: 04/06/2020 Discharge date: 04/10/2020  Admitted From:  Home  Disposition:  Home with Home Health  Recommendations for Outpatient Follow-up:  1. Follow up with PCP in 1 weeks 2. Follow up with cardiology in 2 weeks 3. Please obtain BMP in 1-2 weeks  Home Health:  PT, RN, SW  Discharge Condition: STABLE   CODE STATUS: DNR    Brief Hospitalization Summary: Please see all hospital notes, images, labs for full details of the hospitalization. ADMISSION HPI:  80 y.o. male with medical history significant forparoxysmal atrial fibrillation on apixaban, history of CAD, COPD, CVA x2, dysphagia, anxiety disorder, carotid artery disease, morbid obesity, GERD, hypertension and morbid obesity department who presents to the emergency department due to shortness of breath and increased work of breathing.  Patient was only able to provide limited history, respiratory history was obtained from ED physician and ED medical record.  Patient states that he has had slowly and progressive shortness of breath within last 3 months and that the shortness of breath worsened within last several days.  He states that he ambulates with a [place] and easily gets short of breath after a few steps within last few days.  Apparently, EMS was activated and on arrival of EMS, patient was started on a nonrebreather due to hypoxia .  Steroids and albuterol breathing treatment was provided in route.  He denies chest pain, fever, chills, nausea or vomiting.  ED Course:  In the emergency department, he was initially tachypneic and intermittently tachycardic.  O2 sat was 89% on room air, supplemental oxygen via [place] at 3 LPM was provided with improvement in O2 sat to 92 to 95%.  Work-up in the ED showed normocytic anemia, BUN/creatinine 39/2.0 (baseline creatinine at 1.5-1.6).  Troponin x2- 9>8. Chest x-ray  showed no acute abnormality Hospitalist was asked to admit patient for further evaluation and management.  Hospital Course  Acute respiratory failure with hypoxia-multifactorial - RESOLVED  NOW With COPD and CHF; currently on RA He was treated withalbuterol, Mucinex DC home on 5 more days of oral prednisone He was treated with Protonix to prevent steroid-induced ulcer Continue incentive spirometry and flutter valve  Acute on chronic HFpEF Patient presented with pitting edema to back and flanks No edema to lower extremities due to Coban wrap which will be redressed with ACE bandages today Continue Lasix per home regimen  Continue metoprolol per home regimen Echocardiogram done on 06/26/2019 showed LVEF of 45 to 50% with LV demonstrating regional wall motion abnormalities, severe hypokinesis of the basal to mid inferior wall and the basal inferoseptal wall  Acute kidney injury on CKD stage 3b BUN/creatinine 50/1.8(baseline creatinine at 1.5-1.6),creatinine was 2.12 about 4 months ago Renally adjust medications, avoid nephrotoxic agents/dehydration/hypotension Monitor carefully with ongoing diuresis  Candidal intertrigo Patient presented with intertrigo in the abdominal and groin area Continue nystatin powder  Borderline prolonged QTc(481 ms) Avoided QT prolonging drugs Magnesium levelok  Hypothyroidism Continue Synthroid  Chronic atrial fibrillation/history of stroke Continue metoprolol and apixaban  CAD Continue metoprolol  Essential hypertension-controlled Continue metoprolol and now on IV Lasix  Hyperlipidemia Continue Crestor  Morbid obesity BMI 43.62 kg/m2 Lifestyle changes in outpatient setting  DVT prophylaxis:Eliquis Code Status:DNR Disposition Plan:Home with HH PT, RN, SW  Discharge Diagnoses:  Principal Problem:   COPD with acute bronchitis ([place]) Active Problems:   HLD (hyperlipidemia)   Essential hypertension   GASTROESOPHAGEAL  REFLUX DISEASE   Coronary artery disease involving native coronary artery of native heart without angina pectoris   Chronic atrial fibrillation (HCC)   Acute respiratory failure with hypoxia (HCC)   Candidal intertrigo   History of stroke   Chronic systolic CHF (congestive heart failure) (HCC)   Prolonged QT interval   Hypothyroidism   Discharge Instructions:  Allergies as of 04/10/2020      Reactions   Hctz [hydrochlorothiazide] Anaphylaxis, Other (See Comments)   Throat Swelling   Amitriptyline Other (See Comments)   Made me go crazy   Gabapentin Other (See Comments)   Made me go crazy   Metoprolol Tartrate Other (See Comments)   fatigue   Paroxetine Other (See Comments)   Made me go crazy   Spiriva Handihaler [tiotropium Bromide Monohydrate] Other (See Comments)   Urinary retention      Medication List    TAKE these medications   albuterol 108 (90 Base) MCG/ACT inhaler Commonly known as: VENTOLIN HFA Inhale into the lungs.   albuterol (2.5 MG/3ML) 0.083% nebulizer solution Commonly known as: PROVENTIL Take 3 mLs (2.5 mg total) by nebulization every 6 (six) hours as needed for wheezing or shortness of breath.   Anoro Ellipta 62.5-25 MCG/INH Aepb Generic drug: umeclidinium-vilanterol Inhale 1 puff into the lungs daily.   apixaban 2.5 MG Tabs tablet Commonly known as: Eliquis Take 1 tablet (2.5 mg total) by mouth 2 (two) times daily.   diazepam 2 MG tablet Commonly known as: VALIUM Take 1 tablet (2 mg total) by mouth every 12 (twelve) hours as needed for anxiety.   finasteride 5 MG tablet Commonly known as: PROSCAR Take 5 mg by mouth 2 (two) times daily.   furosemide 40 MG tablet Commonly known as: LASIX Take 2 tablets (80 mg total) by mouth 2 (two) times daily.   HYDROcodone-acetaminophen 5-325 MG tablet Commonly known as: NORCO/VICODIN Take 1 tablet by mouth 2 (two) times daily as needed for moderate pain.   isosorbide mononitrate 30 MG 24 hr  tablet Commonly known as: IMDUR Take 0.5 tablets (15 mg total) by mouth daily. What changed:   medication strength  how much to take   Klor-Con M20 20 MEQ tablet Generic drug: potassium chloride SA Take 40 mEq by mouth daily.   levothyroxine 50 MCG tablet Commonly known as: SYNTHROID Take 50 mcg by mouth daily.   metoprolol tartrate 25 MG tablet Commonly known as: LOPRESSOR Take 25 mg by mouth 2 (two) times daily.   mirtazapine 15 MG tablet Commonly known as: REMERON Take 7.5 mg by mouth daily.   nitroGLYCERIN 0.4 MG SL tablet Commonly known as: NITROSTAT Place 1 tablet (0.4 mg total) under the tongue every 5 (five) minutes as needed for chest pain.   nystatin powder Commonly known as: MYCOSTATIN/NYSTOP Apply topically 2 (two) times daily. Apply to abdominal and groin areas   omeprazole 40 MG capsule Commonly known as: PRILOSEC Take 40 mg by mouth daily.   predniSONE 20 MG tablet Commonly known as: DELTASONE Take 2 tablets (40 mg total) by mouth daily with breakfast for 5 days. Start taking on: April 11, 2020   rosuvastatin 20 MG tablet Commonly known as: CRESTOR Take 20 mg by mouth at bedtime.   sertraline 100 MG tablet Commonly known as: ZOLOFT Take 100 mg by mouth 2 (two) times daily.   tamsulosin 0.4 MG Caps capsule Commonly known as: FLOMAX Take 0.4 mg by mouth daily.       Follow-up Information  Marton Redwood, MD. Schedule an appointment as soon as possible for a visit in 1 week(s).   Specialty: Internal Medicine Why: Hospital Follow Up  Contact information: [place] [address] [phone]        Sherren Mocha, MD. Schedule an appointment as soon as possible for a visit in 2 week(s).   Specialty: Cardiology Contact information: [address] [place] [address] [phone]              Allergies  Allergen Reactions  . Hctz [Hydrochlorothiazide] Anaphylaxis and Other (See Comments)     Throat Swelling  . Amitriptyline Other (See Comments)    Made me go crazy  . Gabapentin Other (See Comments)    Made me go crazy  . Metoprolol Tartrate Other (See Comments)    fatigue  . Paroxetine Other (See Comments)    Made me go crazy  . Spiriva Handihaler [Tiotropium Bromide Monohydrate] Other (See Comments)    Urinary retention   Allergies as of 04/10/2020      Reactions   Hctz [hydrochlorothiazide] Anaphylaxis, Other (See Comments)   Throat Swelling   Amitriptyline Other (See Comments)   Made me go crazy   Gabapentin Other (See Comments)   Made me go crazy   Metoprolol Tartrate Other (See Comments)   fatigue   Paroxetine Other (See Comments)   Made me go crazy   Spiriva Handihaler [tiotropium Bromide Monohydrate] Other (See Comments)   Urinary retention      Medication List    TAKE these medications   albuterol 108 (90 Base) MCG/ACT inhaler Commonly known as: VENTOLIN HFA Inhale into the lungs.   albuterol (2.5 MG/3ML) 0.083% nebulizer solution Commonly known as: PROVENTIL Take 3 mLs (2.5 mg total) by nebulization every 6 (six) hours as needed for wheezing or shortness of breath.   Anoro Ellipta 62.5-25 MCG/INH Aepb Generic drug: umeclidinium-vilanterol Inhale 1 puff into the lungs daily.   apixaban 2.5 MG Tabs tablet Commonly known as: Eliquis Take 1 tablet (2.5 mg total) by mouth 2 (two) times daily.   diazepam 2 MG tablet Commonly known as: VALIUM Take 1 tablet (2 mg total) by mouth every 12 (twelve) hours as needed for anxiety.   finasteride 5 MG tablet Commonly known as: PROSCAR Take 5 mg by mouth 2 (two) times daily.   furosemide 40 MG tablet Commonly known as: LASIX Take 2 tablets (80 mg total) by mouth 2 (two) times daily.   HYDROcodone-acetaminophen 5-325 MG tablet Commonly known as: NORCO/VICODIN Take 1 tablet by mouth 2 (two) times daily as needed for moderate pain.   isosorbide mononitrate 30 MG 24 hr tablet Commonly known as:  IMDUR Take 0.5 tablets (15 mg total) by mouth daily. What changed:   medication strength  how much to take   Klor-Con M20 20 MEQ tablet Generic drug: potassium chloride SA Take 40 mEq by mouth daily.   levothyroxine 50 MCG tablet Commonly known as: SYNTHROID Take 50 mcg by mouth daily.   metoprolol tartrate 25 MG tablet Commonly known as: LOPRESSOR Take 25 mg by mouth 2 (two) times daily.   mirtazapine 15 MG tablet Commonly known as: REMERON Take 7.5 mg by mouth daily.   nitroGLYCERIN 0.4 MG SL tablet Commonly known as: NITROSTAT Place 1 tablet (0.4 mg total) under the tongue every 5 (five) minutes as needed for chest pain.   nystatin powder Commonly known as: MYCOSTATIN/NYSTOP Apply topically 2 (two) times daily. Apply to abdominal and groin  areas   omeprazole 40 MG capsule Commonly known as: PRILOSEC Take 40 mg by mouth daily.   predniSONE 20 MG tablet Commonly known as: DELTASONE Take 2 tablets (40 mg total) by mouth daily with breakfast for 5 days. Start taking on: April 11, 2020   rosuvastatin 20 MG tablet Commonly known as: CRESTOR Take 20 mg by mouth at bedtime.   sertraline 100 MG tablet Commonly known as: ZOLOFT Take 100 mg by mouth 2 (two) times daily.   tamsulosin 0.4 MG Caps capsule Commonly known as: FLOMAX Take 0.4 mg by mouth daily.       Procedures/Studies: DG Chest Portable 1 View  Result Date: 04/06/2020 CLINICAL DATA:  Progressive shortness of breath for 3 months. EXAM: PORTABLE CHEST 1 VIEW COMPARISON:  12/29/2019 FINDINGS: Stable low lung volumes. Patient is rotated. Stable upper normal heart size. Aortic atherosclerosis and tortuosity. Chronic interstitial coarsening, unchanged. No acute airspace disease. No significant pleural effusion. No pneumothorax. Bones are diffusely under mineralized. IMPRESSION: Stable low lung volumes and chronic interstitial coarsening. No acute abnormality. Aortic Atherosclerosis (ICD10-I70.0).  Electronically Signed   By: Keith Rake M.D.   On: 04/06/2020 18:57   ECHOCARDIOGRAM COMPLETE  Result Date: 04/08/2020    ECHOCARDIOGRAM REPORT   Patient Name:   COREE BRAME Date of Exam: 04/08/2020 Medical Rec #:  [id number]        Height:       70.0 in Accession #:    [id number]       Weight:       304.0 lb Date of Birth:  [birth year]        BSA:          2.494 m Patient Age:    21 years         BP:           110/59 mmHg Patient Gender: M                HR:           75 bpm. Exam Location:  [facility] Procedure: 2D Echo Indications:    Dyspnea R06.00  History:        Patient has prior history of Echocardiogram examinations, most                 recent 06/26/2019. CHF, COPD, Arrythmias:Atrial Fibrillation,                 Signs/Symptoms:Chest Pain; Risk Factors:Hypertension,                 Dyslipidemia and Former Smoker. GERD.  Sonographer:    Leavy Cella RDCS (AE) Referring Phys: [phone] Royanne Foots [place]  1. Mild diffuse hypokinesis in afib slighlty worse in septum . Left ventricular ejection fraction, by estimation, is 45 to 50%. The left ventricle has mildly decreased function. The left ventricle has no regional wall motion abnormalities. There is mild  left ventricular hypertrophy. Left ventricular diastolic parameters are indeterminate.  2. Right ventricular systolic function is normal. The right ventricular size is normal.  3. The mitral valve is normal in structure. Trivial mitral valve regurgitation. No evidence of mitral stenosis.  4. The aortic valve is tricuspid. Aortic valve regurgitation is trivial. Mild to moderate aortic valve sclerosis/calcification is present, without any evidence of aortic stenosis.  5. The inferior vena cava is normal in size with greater than 50% respiratory variability, suggesting right atrial pressure of 3 mmHg. FINDINGS  Left Ventricle: Mild diffuse hypokinesis  in afib slighlty worse in septum. Left ventricular ejection fraction, by estimation, is 45 to  50%. The left ventricle has mildly decreased function. The left ventricle has no regional wall motion abnormalities. The left ventricular internal cavity size was normal in size. There is mild left ventricular hypertrophy. Left ventricular diastolic parameters are indeterminate. Right Ventricle: The right ventricular size is normal. No increase in right ventricular wall thickness. Right ventricular systolic function is normal. Left Atrium: Left atrial size was normal in size. Right Atrium: Right atrial size was normal in size. Pericardium: There is no evidence of pericardial effusion. Mitral Valve: The mitral valve is normal in structure. There is mild thickening of the mitral valve leaflet(s). There is mild calcification of the mitral valve leaflet(s). Mild mitral annular calcification. Trivial mitral valve regurgitation. No evidence  of mitral valve stenosis. Tricuspid Valve: The tricuspid valve is normal in structure. Tricuspid valve regurgitation is not demonstrated. No evidence of tricuspid stenosis. Aortic Valve: The aortic valve is tricuspid. Aortic valve regurgitation is trivial. Aortic regurgitation PHT measures 556 msec. Mild to moderate aortic valve sclerosis/calcification is present, without any evidence of aortic stenosis. Pulmonic Valve: The pulmonic valve was normal in structure. Pulmonic valve regurgitation is not visualized. No evidence of pulmonic stenosis. Aorta: The aortic root is normal in size and structure. Venous: The inferior vena cava is normal in size with greater than 50% respiratory variability, suggesting right atrial pressure of 3 mmHg. IAS/Shunts: No atrial level shunt detected by color flow Doppler.  LEFT VENTRICLE PLAX 2D LVIDd:         3.70 cm  Diastology LVIDs:         3.25 cm  LV e' medial:    7.72 cm/s LV PW:         1.29 cm  LV E/e' medial:  16.2 LV IVS:        1.39 cm  LV e' lateral:   9.46 cm/s LVOT diam:     1.90 cm  LV E/e' lateral: 13.2 LVOT Area:     2.84 cm  RIGHT  VENTRICLE RV S prime:     10.60 cm/s TAPSE (M-mode): 1.8 cm LEFT ATRIUM           Index       RIGHT ATRIUM           Index LA diam:      2.90 cm 1.16 cm/m  RA Area:     14.70 cm LA Vol (A2C): 45.2 ml 18.12 ml/m RA Volume:   39.50 ml  15.84 ml/m LA Vol (A4C): 57.2 ml 22.94 ml/m  AORTIC VALVE AI PHT:      556 msec  AORTA Ao Root diam: 3.60 cm MITRAL VALVE                TRICUSPID VALVE MV Area (PHT): 3.63 cm     TR Peak grad:   28.1 mmHg MV Decel Time: 209 msec     TR Vmax:        265.00 cm/s MV E velocity: 125.00 cm/s MV A velocity: 38.60 cm/s   SHUNTS MV E/A ratio:  3.24         Systemic Diam: 1.90 cm Jenkins Rouge MD Electronically signed by Jenkins Rouge MD Signature Date/Time: 04/08/2020/11:39:28 AM    Final       Subjective:  Pt reports that he is feeling well.  He has gotten up with PT several times.  He is off all supplemental oxygen at this  time.   He is diuresing well.   Discharge Exam: Vitals:   04/10/20 0731 04/10/20 1005  BP:  137/61  Pulse:  74  Resp:  16  Temp:  98.4 F (36.9 C)  SpO2: 90% 95%   Vitals:   04/09/20 2031 04/10/20 0537 04/10/20 0731 04/10/20 1005  BP: (!) 112/54 (!) 122/58  137/61  Pulse: 69 67  74  Resp: (!) 22 (!) 22  16  Temp: (!) 97.4 F (36.3 C) (!) 97.5 F (36.4 C)  98.4 F (36.9 C)  TempSrc: Oral Oral  Axillary  SpO2: 96% 92% 90% 95%  Weight:      Height:       General: Pt is alert, awake, not in acute distress Cardiovascular: normal S1/S2 +, no rubs, no gallops Respiratory: CTA bilaterally, no wheezing, no rhonchi Abdominal: Soft, NT, ND, bowel sounds + Extremities: trace edema, no cyanosis Neurological: nonfocal exam.    The results of significant diagnostics from this hospitalization (including imaging, microbiology, ancillary and laboratory) are listed below for reference.     Microbiology: Recent Results (from the past 240 hour(s))  Resp Panel by RT-PCR (Flu A&B, Covid) Nasopharyngeal Swab     Status: None   Collection Time:  04/06/20 10:12 PM   Specimen: Nasopharyngeal Swab; Nasopharyngeal(NP) swabs in vial transport medium  Result Value Ref Range Status   SARS Coronavirus 2 by RT PCR NEGATIVE NEGATIVE Final    Comment: (NOTE) SARS-CoV-2 target nucleic acids are NOT DETECTED.  The SARS-CoV-2 RNA is generally detectable in upper respiratory specimens during the acute phase of infection. The lowest concentration of SARS-CoV-2 viral copies this assay can detect is 138 copies/mL. A negative result does not preclude SARS-Cov-2 infection and should not be used as the sole basis for treatment or other patient management decisions. A negative result may occur with  improper specimen collection/handling, submission of specimen other than nasopharyngeal swab, presence of viral mutation(s) within the areas targeted by this assay, and inadequate number of viral copies(<138 copies/mL). A negative result must be combined with clinical observations, patient history, and epidemiological information. The expected result is Negative.  Fact Sheet for Patients:  EntrepreneurPulse.com.au  Fact Sheet for Healthcare Providers:  IncredibleEmployment.be  This test is no t yet approved or cleared by the [place] FDA and  has been authorized for detection and/or diagnosis of SARS-CoV-2 by FDA under an Emergency Use Authorization (EUA). This EUA will remain  in effect (meaning this test can be used) for the duration of the COVID-19 declaration under Section 564(b)(1) of the Act, 21 U.S.C.section 360bbb-3(b)(1), unless the authorization is terminated  or revoked sooner.       Influenza A by PCR NEGATIVE NEGATIVE Final   Influenza B by PCR NEGATIVE NEGATIVE Final    Comment: (NOTE) The Xpert Xpress SARS-CoV-2/FLU/RSV plus assay is intended as an aid in the diagnosis of influenza from Nasopharyngeal swab specimens and should not be used as a sole basis for treatment. Nasal washings  and aspirates are unacceptable for Xpert Xpress SARS-CoV-2/FLU/RSV testing.  Fact Sheet for Patients: EntrepreneurPulse.com.au  Fact Sheet for Healthcare Providers: IncredibleEmployment.be  This test is not yet approved or cleared by the [place] FDA and has been authorized for detection and/or diagnosis of SARS-CoV-2 by FDA under an Emergency Use Authorization (EUA). This EUA will remain in effect (meaning this test can be used) for the duration of the COVID-19 declaration under Section 564(b)(1) of the Act, 21 U.S.C. section 360bbb-3(b)(1), unless the authorization is  terminated or revoked.  Performed at [facility], [address], [place], [place] [address]      Labs: BNP (last 3 results) Recent Labs    04/06/20 1829  BNP 35.3   Basic Metabolic Panel: Recent Labs  Lab 04/06/20 1828 04/07/20 0940 04/08/20 0558 04/09/20 0500 04/10/20 0425  NA 138 139 139 139 139  K 3.9 4.2 4.5 3.7 3.3*  CL 103 104 103 102 101  CO2 25 24 24 27 28   GLUCOSE 131* 156* 134* 105* 94  BUN 39* 36* 43* 50* 52*  CREATININE 2.01* 1.82* 1.94* 1.80* 2.30*  CALCIUM 9.0 9.6 9.4 9.1 8.8*  MG  --  2.5* 2.6* 2.6* 2.3  PHOS  --  2.6  --   --   --    Liver Function Tests: Recent Labs  Lab 04/06/20 1828 04/07/20 0940  AST 26 28  ALT 17 19  ALKPHOS 80 73  BILITOT 0.7 0.7  PROT 7.0 7.2  ALBUMIN 3.5 3.3*   No results for input(s): LIPASE, AMYLASE in the last 168 hours. No results for input(s): AMMONIA in the last 168 hours. CBC: Recent Labs  Lab 04/06/20 1828 04/07/20 0940 04/08/20 0558 04/09/20 0500 04/10/20 0425  WBC 7.8 11.7* 13.5* 11.5* 9.0  NEUTROABS 6.6  --   --   --   --   HGB 11.8* 11.9* 11.4* 11.6* 11.6*  HCT 38.9* 39.0 37.9* 38.3* 38.1*  MCV 86.1 84.8 85.9 86.8 85.8  PLT 251 250 264 264 252   Cardiac Enzymes: No results for input(s): CKTOTAL, CKMB, CKMBINDEX, TROPONINI in the last 168 hours. BNP: Invalid input(s):  POCBNP CBG: No results for input(s): GLUCAP in the last 168 hours. D-Dimer No results for input(s): DDIMER in the last 72 hours. Hgb A1c No results for input(s): HGBA1C in the last 72 hours. Lipid Profile No results for input(s): CHOL, HDL, LDLCALC, TRIG, CHOLHDL, LDLDIRECT in the last 72 hours. Thyroid function studies No results for input(s): TSH, T4TOTAL, T3FREE, THYROIDAB in the last 72 hours.  Invalid input(s): FREET3 Anemia work up No results for input(s): VITAMINB12, FOLATE, FERRITIN, TIBC, IRON, RETICCTPCT in the last 72 hours. Urinalysis    Component Value Date/Time   COLORURINE AMBER (A) 01/18/2018 0002   APPEARANCEUR TURBID (A) 01/18/2018 0002   LABSPEC 1.028 01/18/2018 0002   PHURINE 5.0 01/18/2018 0002   GLUCOSEU NEGATIVE 01/18/2018 0002   HGBUR MODERATE (A) 01/18/2018 0002   BILIRUBINUR NEGATIVE 01/18/2018 0002   KETONESUR NEGATIVE 01/18/2018 0002   PROTEINUR 100 (A) 01/18/2018 0002   NITRITE NEGATIVE 01/18/2018 0002   LEUKOCYTESUR LARGE (A) 01/18/2018 0002   Sepsis Labs Invalid input(s): PROCALCITONIN,  WBC,  LACTICIDVEN Microbiology Recent Results (from the past 240 hour(s))  Resp Panel by RT-PCR (Flu A&B, Covid) Nasopharyngeal Swab     Status: None   Collection Time: 04/06/20 10:12 PM   Specimen: Nasopharyngeal Swab; Nasopharyngeal(NP) swabs in vial transport medium  Result Value Ref Range Status   SARS Coronavirus 2 by RT PCR NEGATIVE NEGATIVE Final    Comment: (NOTE) SARS-CoV-2 target nucleic acids are NOT DETECTED.  The SARS-CoV-2 RNA is generally detectable in upper respiratory specimens during the acute phase of infection. The lowest concentration of SARS-CoV-2 viral copies this assay can detect is 138 copies/mL. A negative result does not preclude SARS-Cov-2 infection and should not be used as the sole basis for treatment or other patient management decisions. A negative result may occur with  improper specimen collection/handling, submission of  specimen other  than nasopharyngeal swab, presence of viral mutation(s) within the areas targeted by this assay, and inadequate number of viral copies(<138 copies/mL). A negative result must be combined with clinical observations, patient history, and epidemiological information. The expected result is Negative.  Fact Sheet for Patients:  EntrepreneurPulse.com.au  Fact Sheet for Healthcare Providers:  IncredibleEmployment.be  This test is no t yet approved or cleared by the [place] FDA and  has been authorized for detection and/or diagnosis of SARS-CoV-2 by FDA under an Emergency Use Authorization (EUA). This EUA will remain  in effect (meaning this test can be used) for the duration of the COVID-19 declaration under Section 564(b)(1) of the Act, 21 U.S.C.section 360bbb-3(b)(1), unless the authorization is terminated  or revoked sooner.       Influenza A by PCR NEGATIVE NEGATIVE Final   Influenza B by PCR NEGATIVE NEGATIVE Final    Comment: (NOTE) The Xpert Xpress SARS-CoV-2/FLU/RSV plus assay is intended as an aid in the diagnosis of influenza from Nasopharyngeal swab specimens and should not be used as a sole basis for treatment. Nasal washings and aspirates are unacceptable for Xpert Xpress SARS-CoV-2/FLU/RSV testing.  Fact Sheet for Patients: [employer]  Fact Sheet for Healthcare Providers: IncredibleEmployment.be  This test is not yet approved or cleared by the [place] FDA and has been authorized for detection and/or diagnosis of SARS-CoV-2 by FDA under an Emergency Use Authorization (EUA). This EUA will remain in effect (meaning this test can be used) for the duration of the COVID-19 declaration under Section 564(b)(1) of the Act, 21 U.S.C. section 360bbb-3(b)(1), unless the authorization is terminated or revoked.  Performed at [facility], [address]  [place], [place] [address]    Time coordinating discharge: 36 minutes   SIGNED:  Irwin Brakeman, MD  [facility] 04/10/2020, 10:44 AM How to contact the [facility] Attending or Consulting provider [place] or covering provider during after hours [place], for this patient?  1. Check the care team in [facility] and look for a) attending/consulting [facility] provider listed and b) the [facility] team listed 2. Log into [email or web address] and use [place] universal password to access. If you do not have the password, please contact the hospital operator. 3. Locate the [facility] provider you are looking for under [facility] and page to a number that you can be directly reached. 4. If you still have difficulty reaching the provider, please page the [facility] (Director on Call) for the Hospitalists listed on amion for assistance.

## 2020-04-10 NOTE — Progress Notes (Signed)
Pt assessed, No abdominal distention vital signs stable 134/75 p81 O2 97% on 2L and temp 97.7. Pt was able to relieve gas on his stomach sitting on the bed pain and stated he was ready to get ready for transpot.

## 2020-04-12 ENCOUNTER — Other Ambulatory Visit: Payer: Self-pay | Admitting: *Deleted

## 2020-04-12 DIAGNOSIS — J440 Chronic obstructive pulmonary disease with acute lower respiratory infection: Secondary | ICD-10-CM | POA: Diagnosis not present

## 2020-04-12 DIAGNOSIS — I87331 Chronic venous hypertension (idiopathic) with ulcer and inflammation of right lower extremity: Secondary | ICD-10-CM | POA: Diagnosis not present

## 2020-04-12 DIAGNOSIS — I87332 Chronic venous hypertension (idiopathic) with ulcer and inflammation of left lower extremity: Secondary | ICD-10-CM | POA: Diagnosis not present

## 2020-04-12 DIAGNOSIS — L97811 Non-pressure chronic ulcer of other part of right lower leg limited to breakdown of skin: Secondary | ICD-10-CM | POA: Diagnosis not present

## 2020-04-12 DIAGNOSIS — J209 Acute bronchitis, unspecified: Secondary | ICD-10-CM | POA: Diagnosis not present

## 2020-04-12 DIAGNOSIS — L97821 Non-pressure chronic ulcer of other part of left lower leg limited to breakdown of skin: Secondary | ICD-10-CM | POA: Diagnosis not present

## 2020-04-12 NOTE — Patient Outreach (Signed)
[facility]) Care Management  04/12/2020  DEMETRIUS MAHLER [birth year] [id number]   Initial telephone outreach for care management enrollment, Unsuccessful, left message and requested a return call.  Eulah Pont. Myrtie Neither, MSN, [facility] Gerontological Nurse Practitioner [facility] Care Management [phone]

## 2020-04-15 ENCOUNTER — Other Ambulatory Visit: Payer: Self-pay | Admitting: *Deleted

## 2020-04-15 DIAGNOSIS — L97811 Non-pressure chronic ulcer of other part of right lower leg limited to breakdown of skin: Secondary | ICD-10-CM | POA: Diagnosis not present

## 2020-04-15 DIAGNOSIS — I87332 Chronic venous hypertension (idiopathic) with ulcer and inflammation of left lower extremity: Secondary | ICD-10-CM | POA: Diagnosis not present

## 2020-04-15 DIAGNOSIS — L97821 Non-pressure chronic ulcer of other part of left lower leg limited to breakdown of skin: Secondary | ICD-10-CM | POA: Diagnosis not present

## 2020-04-15 DIAGNOSIS — I87331 Chronic venous hypertension (idiopathic) with ulcer and inflammation of right lower extremity: Secondary | ICD-10-CM | POA: Diagnosis not present

## 2020-04-15 DIAGNOSIS — J209 Acute bronchitis, unspecified: Secondary | ICD-10-CM | POA: Diagnosis not present

## 2020-04-15 DIAGNOSIS — J440 Chronic obstructive pulmonary disease with acute lower respiratory infection: Secondary | ICD-10-CM | POA: Diagnosis not present

## 2020-04-15 NOTE — Patient Outreach (Signed)
[facility]) Care Management  04/15/2020  [name] 06-Feb-1941 [id number]  Initial telephone outreach for care management services and possible assistance with placement.  Spoke with [name] today who is pt's daughter and care coordinator of the family. Her father was recently hospitalized for a COPD exacerbation. [name] has given permission for me to access her father's EMR.  She verifies that it is her desire that her father be placed as his condition has declined in the last year considerably . He does not have a caregiver at home. His personal hygiene is lacking. He diet is terrible. He has gained 100# in the last year due to poor diet and fluid retention. He has a couple of friends that come and bring him inappropriate snacks and foods that he requests.   He is getting home health nursing and PT/OT at present.   Ms. [name] reports she was shocked that he was not sent to rehab after this hospitalizaton. She says she was told that he was not eligible for rehab and that she needs to apply for Medicaid for LTC for her father. She has not done this to date. We discussed this at length and covered the following points:      1)Medicaid eligibility. Financial requirements.      2)[name]'s desire for placement. He is of sound mind and therefore has final say. No need to go                further if he will not go. Have discussion with him. Advise family is concerned for his safety, medical            oversight and improvement in his current quality of life. If he agrees to placement, advise it will be for          long term care and he will not return home. If he is in agreement proceed with Medicaid application.      3)Alternatives: in home care, hired privately, cheaper if found via word of mouth or caring.com vs.          Agency. If use caring.com, they will provide names of caregivers in the area, however, you will need          to interview and make choice yourself.  [employer] is not liable for these caregivers.  Provided my name and number for questions, advice. Will not open a case for him at this time, unless, daughter decides it is warranted after home health closes his case.  Also counseled on low salt diet and will send [place] for pt and visitors to adhere to.  Eulah Pont. Myrtie Neither, MSN, [facility] Gerontological Nurse Practitioner [facility] Care Management [phone]

## 2020-04-16 DIAGNOSIS — L97821 Non-pressure chronic ulcer of other part of left lower leg limited to breakdown of skin: Secondary | ICD-10-CM | POA: Diagnosis not present

## 2020-04-16 DIAGNOSIS — N184 Chronic kidney disease, stage 4 (severe): Secondary | ICD-10-CM | POA: Diagnosis not present

## 2020-04-16 DIAGNOSIS — L97811 Non-pressure chronic ulcer of other part of right lower leg limited to breakdown of skin: Secondary | ICD-10-CM | POA: Diagnosis not present

## 2020-04-16 DIAGNOSIS — I5022 Chronic systolic (congestive) heart failure: Secondary | ICD-10-CM | POA: Diagnosis not present

## 2020-04-16 DIAGNOSIS — I87332 Chronic venous hypertension (idiopathic) with ulcer and inflammation of left lower extremity: Secondary | ICD-10-CM | POA: Diagnosis not present

## 2020-04-16 DIAGNOSIS — J209 Acute bronchitis, unspecified: Secondary | ICD-10-CM | POA: Diagnosis not present

## 2020-04-16 DIAGNOSIS — K219 Gastro-esophageal reflux disease without esophagitis: Secondary | ICD-10-CM | POA: Diagnosis not present

## 2020-04-16 DIAGNOSIS — E039 Hypothyroidism, unspecified: Secondary | ICD-10-CM | POA: Diagnosis not present

## 2020-04-16 DIAGNOSIS — J440 Chronic obstructive pulmonary disease with acute lower respiratory infection: Secondary | ICD-10-CM | POA: Diagnosis not present

## 2020-04-16 DIAGNOSIS — J441 Chronic obstructive pulmonary disease with (acute) exacerbation: Secondary | ICD-10-CM | POA: Diagnosis not present

## 2020-04-16 DIAGNOSIS — I130 Hypertensive heart and chronic kidney disease with heart failure and stage 1 through stage 4 chronic kidney disease, or unspecified chronic kidney disease: Secondary | ICD-10-CM | POA: Diagnosis not present

## 2020-04-16 DIAGNOSIS — I2510 Atherosclerotic heart disease of native coronary artery without angina pectoris: Secondary | ICD-10-CM | POA: Diagnosis not present

## 2020-04-16 DIAGNOSIS — I87331 Chronic venous hypertension (idiopathic) with ulcer and inflammation of right lower extremity: Secondary | ICD-10-CM | POA: Diagnosis not present

## 2020-04-16 DIAGNOSIS — I480 Paroxysmal atrial fibrillation: Secondary | ICD-10-CM | POA: Diagnosis not present

## 2020-04-18 DIAGNOSIS — J209 Acute bronchitis, unspecified: Secondary | ICD-10-CM | POA: Diagnosis not present

## 2020-04-18 DIAGNOSIS — L97811 Non-pressure chronic ulcer of other part of right lower leg limited to breakdown of skin: Secondary | ICD-10-CM | POA: Diagnosis not present

## 2020-04-18 DIAGNOSIS — I87332 Chronic venous hypertension (idiopathic) with ulcer and inflammation of left lower extremity: Secondary | ICD-10-CM | POA: Diagnosis not present

## 2020-04-18 DIAGNOSIS — J440 Chronic obstructive pulmonary disease with acute lower respiratory infection: Secondary | ICD-10-CM | POA: Diagnosis not present

## 2020-04-18 DIAGNOSIS — L97821 Non-pressure chronic ulcer of other part of left lower leg limited to breakdown of skin: Secondary | ICD-10-CM | POA: Diagnosis not present

## 2020-04-18 DIAGNOSIS — I87331 Chronic venous hypertension (idiopathic) with ulcer and inflammation of right lower extremity: Secondary | ICD-10-CM | POA: Diagnosis not present

## 2020-04-19 DIAGNOSIS — I87332 Chronic venous hypertension (idiopathic) with ulcer and inflammation of left lower extremity: Secondary | ICD-10-CM | POA: Diagnosis not present

## 2020-04-19 DIAGNOSIS — J209 Acute bronchitis, unspecified: Secondary | ICD-10-CM | POA: Diagnosis not present

## 2020-04-19 DIAGNOSIS — L97811 Non-pressure chronic ulcer of other part of right lower leg limited to breakdown of skin: Secondary | ICD-10-CM | POA: Diagnosis not present

## 2020-04-19 DIAGNOSIS — L97821 Non-pressure chronic ulcer of other part of left lower leg limited to breakdown of skin: Secondary | ICD-10-CM | POA: Diagnosis not present

## 2020-04-19 DIAGNOSIS — J440 Chronic obstructive pulmonary disease with acute lower respiratory infection: Secondary | ICD-10-CM | POA: Diagnosis not present

## 2020-04-19 DIAGNOSIS — I87331 Chronic venous hypertension (idiopathic) with ulcer and inflammation of right lower extremity: Secondary | ICD-10-CM | POA: Diagnosis not present

## 2020-04-22 DIAGNOSIS — J209 Acute bronchitis, unspecified: Secondary | ICD-10-CM | POA: Diagnosis not present

## 2020-04-22 DIAGNOSIS — I87331 Chronic venous hypertension (idiopathic) with ulcer and inflammation of right lower extremity: Secondary | ICD-10-CM | POA: Diagnosis not present

## 2020-04-22 DIAGNOSIS — I480 Paroxysmal atrial fibrillation: Secondary | ICD-10-CM | POA: Diagnosis not present

## 2020-04-22 DIAGNOSIS — I5022 Chronic systolic (congestive) heart failure: Secondary | ICD-10-CM | POA: Diagnosis not present

## 2020-04-22 DIAGNOSIS — L97811 Non-pressure chronic ulcer of other part of right lower leg limited to breakdown of skin: Secondary | ICD-10-CM | POA: Diagnosis not present

## 2020-04-22 DIAGNOSIS — R609 Edema, unspecified: Secondary | ICD-10-CM | POA: Diagnosis not present

## 2020-04-22 DIAGNOSIS — J449 Chronic obstructive pulmonary disease, unspecified: Secondary | ICD-10-CM | POA: Diagnosis not present

## 2020-04-22 DIAGNOSIS — E039 Hypothyroidism, unspecified: Secondary | ICD-10-CM | POA: Diagnosis not present

## 2020-04-22 DIAGNOSIS — N184 Chronic kidney disease, stage 4 (severe): Secondary | ICD-10-CM | POA: Diagnosis not present

## 2020-04-22 DIAGNOSIS — L97821 Non-pressure chronic ulcer of other part of left lower leg limited to breakdown of skin: Secondary | ICD-10-CM | POA: Diagnosis not present

## 2020-04-22 DIAGNOSIS — I87332 Chronic venous hypertension (idiopathic) with ulcer and inflammation of left lower extremity: Secondary | ICD-10-CM | POA: Diagnosis not present

## 2020-04-22 DIAGNOSIS — J440 Chronic obstructive pulmonary disease with acute lower respiratory infection: Secondary | ICD-10-CM | POA: Diagnosis not present

## 2020-04-24 DIAGNOSIS — J440 Chronic obstructive pulmonary disease with acute lower respiratory infection: Secondary | ICD-10-CM | POA: Diagnosis not present

## 2020-04-24 DIAGNOSIS — J209 Acute bronchitis, unspecified: Secondary | ICD-10-CM | POA: Diagnosis not present

## 2020-04-24 DIAGNOSIS — I87331 Chronic venous hypertension (idiopathic) with ulcer and inflammation of right lower extremity: Secondary | ICD-10-CM | POA: Diagnosis not present

## 2020-04-24 DIAGNOSIS — L97821 Non-pressure chronic ulcer of other part of left lower leg limited to breakdown of skin: Secondary | ICD-10-CM | POA: Diagnosis not present

## 2020-04-24 DIAGNOSIS — I87332 Chronic venous hypertension (idiopathic) with ulcer and inflammation of left lower extremity: Secondary | ICD-10-CM | POA: Diagnosis not present

## 2020-04-24 DIAGNOSIS — L97811 Non-pressure chronic ulcer of other part of right lower leg limited to breakdown of skin: Secondary | ICD-10-CM | POA: Diagnosis not present

## 2020-04-25 DIAGNOSIS — L97821 Non-pressure chronic ulcer of other part of left lower leg limited to breakdown of skin: Secondary | ICD-10-CM | POA: Diagnosis not present

## 2020-04-25 DIAGNOSIS — I87331 Chronic venous hypertension (idiopathic) with ulcer and inflammation of right lower extremity: Secondary | ICD-10-CM | POA: Diagnosis not present

## 2020-04-25 DIAGNOSIS — J440 Chronic obstructive pulmonary disease with acute lower respiratory infection: Secondary | ICD-10-CM | POA: Diagnosis not present

## 2020-04-25 DIAGNOSIS — L97811 Non-pressure chronic ulcer of other part of right lower leg limited to breakdown of skin: Secondary | ICD-10-CM | POA: Diagnosis not present

## 2020-04-25 DIAGNOSIS — J209 Acute bronchitis, unspecified: Secondary | ICD-10-CM | POA: Diagnosis not present

## 2020-04-25 DIAGNOSIS — I87332 Chronic venous hypertension (idiopathic) with ulcer and inflammation of left lower extremity: Secondary | ICD-10-CM | POA: Diagnosis not present

## 2020-04-26 DIAGNOSIS — L97811 Non-pressure chronic ulcer of other part of right lower leg limited to breakdown of skin: Secondary | ICD-10-CM | POA: Diagnosis not present

## 2020-04-26 DIAGNOSIS — J440 Chronic obstructive pulmonary disease with acute lower respiratory infection: Secondary | ICD-10-CM | POA: Diagnosis not present

## 2020-04-26 DIAGNOSIS — J209 Acute bronchitis, unspecified: Secondary | ICD-10-CM | POA: Diagnosis not present

## 2020-04-26 DIAGNOSIS — I87331 Chronic venous hypertension (idiopathic) with ulcer and inflammation of right lower extremity: Secondary | ICD-10-CM | POA: Diagnosis not present

## 2020-04-26 DIAGNOSIS — L97821 Non-pressure chronic ulcer of other part of left lower leg limited to breakdown of skin: Secondary | ICD-10-CM | POA: Diagnosis not present

## 2020-04-26 DIAGNOSIS — I87332 Chronic venous hypertension (idiopathic) with ulcer and inflammation of left lower extremity: Secondary | ICD-10-CM | POA: Diagnosis not present

## 2020-04-29 DIAGNOSIS — J209 Acute bronchitis, unspecified: Secondary | ICD-10-CM | POA: Diagnosis not present

## 2020-04-29 DIAGNOSIS — L97821 Non-pressure chronic ulcer of other part of left lower leg limited to breakdown of skin: Secondary | ICD-10-CM | POA: Diagnosis not present

## 2020-04-29 DIAGNOSIS — J440 Chronic obstructive pulmonary disease with acute lower respiratory infection: Secondary | ICD-10-CM | POA: Diagnosis not present

## 2020-04-29 DIAGNOSIS — I87331 Chronic venous hypertension (idiopathic) with ulcer and inflammation of right lower extremity: Secondary | ICD-10-CM | POA: Diagnosis not present

## 2020-04-29 DIAGNOSIS — I87332 Chronic venous hypertension (idiopathic) with ulcer and inflammation of left lower extremity: Secondary | ICD-10-CM | POA: Diagnosis not present

## 2020-04-29 DIAGNOSIS — L97811 Non-pressure chronic ulcer of other part of right lower leg limited to breakdown of skin: Secondary | ICD-10-CM | POA: Diagnosis not present

## 2020-04-30 DIAGNOSIS — I87331 Chronic venous hypertension (idiopathic) with ulcer and inflammation of right lower extremity: Secondary | ICD-10-CM | POA: Diagnosis not present

## 2020-04-30 DIAGNOSIS — L97811 Non-pressure chronic ulcer of other part of right lower leg limited to breakdown of skin: Secondary | ICD-10-CM | POA: Diagnosis not present

## 2020-04-30 DIAGNOSIS — J440 Chronic obstructive pulmonary disease with acute lower respiratory infection: Secondary | ICD-10-CM | POA: Diagnosis not present

## 2020-04-30 DIAGNOSIS — L97821 Non-pressure chronic ulcer of other part of left lower leg limited to breakdown of skin: Secondary | ICD-10-CM | POA: Diagnosis not present

## 2020-04-30 DIAGNOSIS — I87332 Chronic venous hypertension (idiopathic) with ulcer and inflammation of left lower extremity: Secondary | ICD-10-CM | POA: Diagnosis not present

## 2020-04-30 DIAGNOSIS — J209 Acute bronchitis, unspecified: Secondary | ICD-10-CM | POA: Diagnosis not present

## 2020-05-01 DIAGNOSIS — I87332 Chronic venous hypertension (idiopathic) with ulcer and inflammation of left lower extremity: Secondary | ICD-10-CM | POA: Diagnosis not present

## 2020-05-01 DIAGNOSIS — J209 Acute bronchitis, unspecified: Secondary | ICD-10-CM | POA: Diagnosis not present

## 2020-05-01 DIAGNOSIS — L97811 Non-pressure chronic ulcer of other part of right lower leg limited to breakdown of skin: Secondary | ICD-10-CM | POA: Diagnosis not present

## 2020-05-01 DIAGNOSIS — I87331 Chronic venous hypertension (idiopathic) with ulcer and inflammation of right lower extremity: Secondary | ICD-10-CM | POA: Diagnosis not present

## 2020-05-01 DIAGNOSIS — J440 Chronic obstructive pulmonary disease with acute lower respiratory infection: Secondary | ICD-10-CM | POA: Diagnosis not present

## 2020-05-01 DIAGNOSIS — L97821 Non-pressure chronic ulcer of other part of left lower leg limited to breakdown of skin: Secondary | ICD-10-CM | POA: Diagnosis not present

## 2020-05-02 DIAGNOSIS — I87332 Chronic venous hypertension (idiopathic) with ulcer and inflammation of left lower extremity: Secondary | ICD-10-CM | POA: Diagnosis not present

## 2020-05-02 DIAGNOSIS — L97821 Non-pressure chronic ulcer of other part of left lower leg limited to breakdown of skin: Secondary | ICD-10-CM | POA: Diagnosis not present

## 2020-05-02 DIAGNOSIS — R609 Edema, unspecified: Secondary | ICD-10-CM | POA: Diagnosis not present

## 2020-05-02 DIAGNOSIS — J209 Acute bronchitis, unspecified: Secondary | ICD-10-CM | POA: Diagnosis not present

## 2020-05-02 DIAGNOSIS — J440 Chronic obstructive pulmonary disease with acute lower respiratory infection: Secondary | ICD-10-CM | POA: Diagnosis not present

## 2020-05-02 DIAGNOSIS — I87331 Chronic venous hypertension (idiopathic) with ulcer and inflammation of right lower extremity: Secondary | ICD-10-CM | POA: Diagnosis not present

## 2020-05-02 DIAGNOSIS — L97811 Non-pressure chronic ulcer of other part of right lower leg limited to breakdown of skin: Secondary | ICD-10-CM | POA: Diagnosis not present

## 2020-05-03 DIAGNOSIS — L97821 Non-pressure chronic ulcer of other part of left lower leg limited to breakdown of skin: Secondary | ICD-10-CM | POA: Diagnosis not present

## 2020-05-03 DIAGNOSIS — I87332 Chronic venous hypertension (idiopathic) with ulcer and inflammation of left lower extremity: Secondary | ICD-10-CM | POA: Diagnosis not present

## 2020-05-03 DIAGNOSIS — I87331 Chronic venous hypertension (idiopathic) with ulcer and inflammation of right lower extremity: Secondary | ICD-10-CM | POA: Diagnosis not present

## 2020-05-03 DIAGNOSIS — J440 Chronic obstructive pulmonary disease with acute lower respiratory infection: Secondary | ICD-10-CM | POA: Diagnosis not present

## 2020-05-03 DIAGNOSIS — J209 Acute bronchitis, unspecified: Secondary | ICD-10-CM | POA: Diagnosis not present

## 2020-05-03 DIAGNOSIS — L97811 Non-pressure chronic ulcer of other part of right lower leg limited to breakdown of skin: Secondary | ICD-10-CM | POA: Diagnosis not present

## 2020-05-05 DIAGNOSIS — R109 Unspecified abdominal pain: Secondary | ICD-10-CM | POA: Diagnosis not present

## 2020-05-06 DIAGNOSIS — E039 Hypothyroidism, unspecified: Secondary | ICD-10-CM | POA: Diagnosis not present

## 2020-05-06 DIAGNOSIS — Z9981 Dependence on supplemental oxygen: Secondary | ICD-10-CM | POA: Diagnosis not present

## 2020-05-06 DIAGNOSIS — J449 Chronic obstructive pulmonary disease, unspecified: Secondary | ICD-10-CM | POA: Diagnosis not present

## 2020-05-06 DIAGNOSIS — I5033 Acute on chronic diastolic (congestive) heart failure: Secondary | ICD-10-CM | POA: Diagnosis not present

## 2020-05-06 DIAGNOSIS — I130 Hypertensive heart and chronic kidney disease with heart failure and stage 1 through stage 4 chronic kidney disease, or unspecified chronic kidney disease: Secondary | ICD-10-CM | POA: Diagnosis not present

## 2020-05-06 DIAGNOSIS — E785 Hyperlipidemia, unspecified: Secondary | ICD-10-CM | POA: Diagnosis not present

## 2020-05-06 DIAGNOSIS — G629 Polyneuropathy, unspecified: Secondary | ICD-10-CM | POA: Diagnosis not present

## 2020-05-06 DIAGNOSIS — Z9181 History of falling: Secondary | ICD-10-CM | POA: Diagnosis not present

## 2020-05-06 DIAGNOSIS — N1832 Chronic kidney disease, stage 3b: Secondary | ICD-10-CM | POA: Diagnosis not present

## 2020-05-06 DIAGNOSIS — I69398 Other sequelae of cerebral infarction: Secondary | ICD-10-CM | POA: Diagnosis not present

## 2020-05-06 DIAGNOSIS — R2681 Unsteadiness on feet: Secondary | ICD-10-CM | POA: Diagnosis not present

## 2020-05-06 DIAGNOSIS — F419 Anxiety disorder, unspecified: Secondary | ICD-10-CM | POA: Diagnosis not present

## 2020-05-06 DIAGNOSIS — R7301 Impaired fasting glucose: Secondary | ICD-10-CM | POA: Diagnosis not present

## 2020-05-06 DIAGNOSIS — Z7982 Long term (current) use of aspirin: Secondary | ICD-10-CM | POA: Diagnosis not present

## 2020-05-06 DIAGNOSIS — Z7901 Long term (current) use of anticoagulants: Secondary | ICD-10-CM | POA: Diagnosis not present

## 2020-05-06 DIAGNOSIS — I87323 Chronic venous hypertension (idiopathic) with inflammation of bilateral lower extremity: Secondary | ICD-10-CM | POA: Diagnosis not present

## 2020-05-06 DIAGNOSIS — Z79891 Long term (current) use of opiate analgesic: Secondary | ICD-10-CM | POA: Diagnosis not present

## 2020-05-06 DIAGNOSIS — I252 Old myocardial infarction: Secondary | ICD-10-CM | POA: Diagnosis not present

## 2020-05-06 DIAGNOSIS — I2510 Atherosclerotic heart disease of native coronary artery without angina pectoris: Secondary | ICD-10-CM | POA: Diagnosis not present

## 2020-05-06 DIAGNOSIS — Z87891 Personal history of nicotine dependence: Secondary | ICD-10-CM | POA: Diagnosis not present

## 2020-05-06 DIAGNOSIS — I482 Chronic atrial fibrillation, unspecified: Secondary | ICD-10-CM | POA: Diagnosis not present

## 2020-05-06 DIAGNOSIS — I69322 Dysarthria following cerebral infarction: Secondary | ICD-10-CM | POA: Diagnosis not present

## 2020-05-06 DIAGNOSIS — Z95820 Peripheral vascular angioplasty status with implants and grafts: Secondary | ICD-10-CM | POA: Diagnosis not present

## 2020-05-07 DIAGNOSIS — I87323 Chronic venous hypertension (idiopathic) with inflammation of bilateral lower extremity: Secondary | ICD-10-CM | POA: Diagnosis not present

## 2020-05-07 DIAGNOSIS — I482 Chronic atrial fibrillation, unspecified: Secondary | ICD-10-CM | POA: Diagnosis not present

## 2020-05-07 DIAGNOSIS — J449 Chronic obstructive pulmonary disease, unspecified: Secondary | ICD-10-CM | POA: Diagnosis not present

## 2020-05-07 DIAGNOSIS — N1832 Chronic kidney disease, stage 3b: Secondary | ICD-10-CM | POA: Diagnosis not present

## 2020-05-07 DIAGNOSIS — I5033 Acute on chronic diastolic (congestive) heart failure: Secondary | ICD-10-CM | POA: Diagnosis not present

## 2020-05-07 DIAGNOSIS — I130 Hypertensive heart and chronic kidney disease with heart failure and stage 1 through stage 4 chronic kidney disease, or unspecified chronic kidney disease: Secondary | ICD-10-CM | POA: Diagnosis not present

## 2020-05-08 DIAGNOSIS — J449 Chronic obstructive pulmonary disease, unspecified: Secondary | ICD-10-CM | POA: Diagnosis not present

## 2020-05-08 DIAGNOSIS — I482 Chronic atrial fibrillation, unspecified: Secondary | ICD-10-CM | POA: Diagnosis not present

## 2020-05-08 DIAGNOSIS — I87323 Chronic venous hypertension (idiopathic) with inflammation of bilateral lower extremity: Secondary | ICD-10-CM | POA: Diagnosis not present

## 2020-05-08 DIAGNOSIS — I130 Hypertensive heart and chronic kidney disease with heart failure and stage 1 through stage 4 chronic kidney disease, or unspecified chronic kidney disease: Secondary | ICD-10-CM | POA: Diagnosis not present

## 2020-05-08 DIAGNOSIS — I5033 Acute on chronic diastolic (congestive) heart failure: Secondary | ICD-10-CM | POA: Diagnosis not present

## 2020-05-08 DIAGNOSIS — N1832 Chronic kidney disease, stage 3b: Secondary | ICD-10-CM | POA: Diagnosis not present

## 2020-05-09 ENCOUNTER — Telehealth: Payer: Self-pay | Admitting: Cardiovascular Disease

## 2020-05-09 DIAGNOSIS — J449 Chronic obstructive pulmonary disease, unspecified: Secondary | ICD-10-CM | POA: Diagnosis not present

## 2020-05-09 DIAGNOSIS — I87323 Chronic venous hypertension (idiopathic) with inflammation of bilateral lower extremity: Secondary | ICD-10-CM | POA: Diagnosis not present

## 2020-05-09 DIAGNOSIS — N1832 Chronic kidney disease, stage 3b: Secondary | ICD-10-CM | POA: Diagnosis not present

## 2020-05-09 DIAGNOSIS — I130 Hypertensive heart and chronic kidney disease with heart failure and stage 1 through stage 4 chronic kidney disease, or unspecified chronic kidney disease: Secondary | ICD-10-CM | POA: Diagnosis not present

## 2020-05-09 DIAGNOSIS — I5033 Acute on chronic diastolic (congestive) heart failure: Secondary | ICD-10-CM | POA: Diagnosis not present

## 2020-05-09 DIAGNOSIS — I482 Chronic atrial fibrillation, unspecified: Secondary | ICD-10-CM | POA: Diagnosis not present

## 2020-05-09 NOTE — Telephone Encounter (Signed)
   Pt c/o medication issue:  1. Name of Medication: isosorbide mononitrate (IMDUR) 30 MG 24 hr tablet  2. How are you currently taking this medication (dosage and times per day)?Take 0.5 tablets (15 mg total) by mouth daily.  3. Are you having a reaction (difficulty breathing--STAT)?   4. What is your medication issue? [place] Pharmacist at [facility] [employer], she would like to verify dosage of pt's imdur. According to pt's daughter she's been giving pt imdur 60 mg half a tablet a day. She wanted to check in with Dr. Burt Knack if that's the right dosage. If so [facility]  She also helping [facility] to [facility]. Please send all heart meds prescribed [facility] Dr [facility]

## 2020-05-13 DIAGNOSIS — I482 Chronic atrial fibrillation, unspecified: Secondary | ICD-10-CM | POA: Diagnosis not present

## 2020-05-13 DIAGNOSIS — I5033 Acute on chronic diastolic (congestive) heart failure: Secondary | ICD-10-CM | POA: Diagnosis not present

## 2020-05-13 DIAGNOSIS — J449 Chronic obstructive pulmonary disease, unspecified: Secondary | ICD-10-CM | POA: Diagnosis not present

## 2020-05-13 DIAGNOSIS — I130 Hypertensive heart and chronic kidney disease with heart failure and stage 1 through stage 4 chronic kidney disease, or unspecified chronic kidney disease: Secondary | ICD-10-CM | POA: Diagnosis not present

## 2020-05-13 DIAGNOSIS — I87323 Chronic venous hypertension (idiopathic) with inflammation of bilateral lower extremity: Secondary | ICD-10-CM | POA: Diagnosis not present

## 2020-05-13 DIAGNOSIS — N1832 Chronic kidney disease, stage 3b: Secondary | ICD-10-CM | POA: Diagnosis not present

## 2020-05-14 MED ORDER — ISOSORBIDE MONONITRATE ER 30 MG PO TB24
15.0000 mg | ORAL_TABLET | Freq: Every day | ORAL | 3 refills | Status: AC
Start: 2020-05-14 — End: 2021-05-09

## 2020-05-14 NOTE — Telephone Encounter (Signed)
Confirmed with the patient's daughter he is taking isosorbide 15 mg daily as instructed at the hospital.  Updates prescription set to [facility].

## 2020-05-15 DIAGNOSIS — I130 Hypertensive heart and chronic kidney disease with heart failure and stage 1 through stage 4 chronic kidney disease, or unspecified chronic kidney disease: Secondary | ICD-10-CM | POA: Diagnosis not present

## 2020-05-15 DIAGNOSIS — N1832 Chronic kidney disease, stage 3b: Secondary | ICD-10-CM | POA: Diagnosis not present

## 2020-05-15 DIAGNOSIS — I482 Chronic atrial fibrillation, unspecified: Secondary | ICD-10-CM | POA: Diagnosis not present

## 2020-05-15 DIAGNOSIS — I87323 Chronic venous hypertension (idiopathic) with inflammation of bilateral lower extremity: Secondary | ICD-10-CM | POA: Diagnosis not present

## 2020-05-15 DIAGNOSIS — I5033 Acute on chronic diastolic (congestive) heart failure: Secondary | ICD-10-CM | POA: Diagnosis not present

## 2020-05-15 DIAGNOSIS — J449 Chronic obstructive pulmonary disease, unspecified: Secondary | ICD-10-CM | POA: Diagnosis not present

## 2020-05-16 DIAGNOSIS — I482 Chronic atrial fibrillation, unspecified: Secondary | ICD-10-CM | POA: Diagnosis not present

## 2020-05-16 DIAGNOSIS — J449 Chronic obstructive pulmonary disease, unspecified: Secondary | ICD-10-CM | POA: Diagnosis not present

## 2020-05-16 DIAGNOSIS — N1832 Chronic kidney disease, stage 3b: Secondary | ICD-10-CM | POA: Diagnosis not present

## 2020-05-16 DIAGNOSIS — I87323 Chronic venous hypertension (idiopathic) with inflammation of bilateral lower extremity: Secondary | ICD-10-CM | POA: Diagnosis not present

## 2020-05-16 DIAGNOSIS — I130 Hypertensive heart and chronic kidney disease with heart failure and stage 1 through stage 4 chronic kidney disease, or unspecified chronic kidney disease: Secondary | ICD-10-CM | POA: Diagnosis not present

## 2020-05-16 DIAGNOSIS — I5033 Acute on chronic diastolic (congestive) heart failure: Secondary | ICD-10-CM | POA: Diagnosis not present

## 2020-05-20 DIAGNOSIS — I87323 Chronic venous hypertension (idiopathic) with inflammation of bilateral lower extremity: Secondary | ICD-10-CM | POA: Diagnosis not present

## 2020-05-20 DIAGNOSIS — I482 Chronic atrial fibrillation, unspecified: Secondary | ICD-10-CM | POA: Diagnosis not present

## 2020-05-20 DIAGNOSIS — N1832 Chronic kidney disease, stage 3b: Secondary | ICD-10-CM | POA: Diagnosis not present

## 2020-05-20 DIAGNOSIS — I5033 Acute on chronic diastolic (congestive) heart failure: Secondary | ICD-10-CM | POA: Diagnosis not present

## 2020-05-20 DIAGNOSIS — I130 Hypertensive heart and chronic kidney disease with heart failure and stage 1 through stage 4 chronic kidney disease, or unspecified chronic kidney disease: Secondary | ICD-10-CM | POA: Diagnosis not present

## 2020-05-20 DIAGNOSIS — J449 Chronic obstructive pulmonary disease, unspecified: Secondary | ICD-10-CM | POA: Diagnosis not present

## 2020-05-23 DIAGNOSIS — I5022 Chronic systolic (congestive) heart failure: Secondary | ICD-10-CM | POA: Diagnosis not present

## 2020-05-23 DIAGNOSIS — N1832 Chronic kidney disease, stage 3b: Secondary | ICD-10-CM | POA: Diagnosis not present

## 2020-05-23 DIAGNOSIS — E039 Hypothyroidism, unspecified: Secondary | ICD-10-CM | POA: Diagnosis not present

## 2020-05-23 DIAGNOSIS — I5033 Acute on chronic diastolic (congestive) heart failure: Secondary | ICD-10-CM | POA: Diagnosis not present

## 2020-05-23 DIAGNOSIS — I482 Chronic atrial fibrillation, unspecified: Secondary | ICD-10-CM | POA: Diagnosis not present

## 2020-05-23 DIAGNOSIS — N184 Chronic kidney disease, stage 4 (severe): Secondary | ICD-10-CM | POA: Diagnosis not present

## 2020-05-23 DIAGNOSIS — I130 Hypertensive heart and chronic kidney disease with heart failure and stage 1 through stage 4 chronic kidney disease, or unspecified chronic kidney disease: Secondary | ICD-10-CM | POA: Diagnosis not present

## 2020-05-23 DIAGNOSIS — I87323 Chronic venous hypertension (idiopathic) with inflammation of bilateral lower extremity: Secondary | ICD-10-CM | POA: Diagnosis not present

## 2020-05-23 DIAGNOSIS — J449 Chronic obstructive pulmonary disease, unspecified: Secondary | ICD-10-CM | POA: Diagnosis not present

## 2020-05-27 DIAGNOSIS — I87323 Chronic venous hypertension (idiopathic) with inflammation of bilateral lower extremity: Secondary | ICD-10-CM | POA: Diagnosis not present

## 2020-05-27 DIAGNOSIS — J449 Chronic obstructive pulmonary disease, unspecified: Secondary | ICD-10-CM | POA: Diagnosis not present

## 2020-05-27 DIAGNOSIS — I482 Chronic atrial fibrillation, unspecified: Secondary | ICD-10-CM | POA: Diagnosis not present

## 2020-05-27 DIAGNOSIS — N1832 Chronic kidney disease, stage 3b: Secondary | ICD-10-CM | POA: Diagnosis not present

## 2020-05-27 DIAGNOSIS — I5033 Acute on chronic diastolic (congestive) heart failure: Secondary | ICD-10-CM | POA: Diagnosis not present

## 2020-05-27 DIAGNOSIS — I130 Hypertensive heart and chronic kidney disease with heart failure and stage 1 through stage 4 chronic kidney disease, or unspecified chronic kidney disease: Secondary | ICD-10-CM | POA: Diagnosis not present

## 2020-05-30 DIAGNOSIS — J449 Chronic obstructive pulmonary disease, unspecified: Secondary | ICD-10-CM | POA: Diagnosis not present

## 2020-05-30 DIAGNOSIS — I87323 Chronic venous hypertension (idiopathic) with inflammation of bilateral lower extremity: Secondary | ICD-10-CM | POA: Diagnosis not present

## 2020-05-30 DIAGNOSIS — I130 Hypertensive heart and chronic kidney disease with heart failure and stage 1 through stage 4 chronic kidney disease, or unspecified chronic kidney disease: Secondary | ICD-10-CM | POA: Diagnosis not present

## 2020-05-30 DIAGNOSIS — I482 Chronic atrial fibrillation, unspecified: Secondary | ICD-10-CM | POA: Diagnosis not present

## 2020-05-30 DIAGNOSIS — I5033 Acute on chronic diastolic (congestive) heart failure: Secondary | ICD-10-CM | POA: Diagnosis not present

## 2020-05-30 DIAGNOSIS — N1832 Chronic kidney disease, stage 3b: Secondary | ICD-10-CM | POA: Diagnosis not present

## 2020-06-03 DIAGNOSIS — J449 Chronic obstructive pulmonary disease, unspecified: Secondary | ICD-10-CM | POA: Diagnosis not present

## 2020-06-03 DIAGNOSIS — I5033 Acute on chronic diastolic (congestive) heart failure: Secondary | ICD-10-CM | POA: Diagnosis not present

## 2020-06-03 DIAGNOSIS — N1832 Chronic kidney disease, stage 3b: Secondary | ICD-10-CM | POA: Diagnosis not present

## 2020-06-03 DIAGNOSIS — I87323 Chronic venous hypertension (idiopathic) with inflammation of bilateral lower extremity: Secondary | ICD-10-CM | POA: Diagnosis not present

## 2020-06-03 DIAGNOSIS — I130 Hypertensive heart and chronic kidney disease with heart failure and stage 1 through stage 4 chronic kidney disease, or unspecified chronic kidney disease: Secondary | ICD-10-CM | POA: Diagnosis not present

## 2020-06-03 DIAGNOSIS — I482 Chronic atrial fibrillation, unspecified: Secondary | ICD-10-CM | POA: Diagnosis not present

## 2020-06-05 DIAGNOSIS — N1832 Chronic kidney disease, stage 3b: Secondary | ICD-10-CM | POA: Diagnosis not present

## 2020-06-05 DIAGNOSIS — Z7982 Long term (current) use of aspirin: Secondary | ICD-10-CM | POA: Diagnosis not present

## 2020-06-05 DIAGNOSIS — E039 Hypothyroidism, unspecified: Secondary | ICD-10-CM | POA: Diagnosis not present

## 2020-06-05 DIAGNOSIS — I5033 Acute on chronic diastolic (congestive) heart failure: Secondary | ICD-10-CM | POA: Diagnosis not present

## 2020-06-05 DIAGNOSIS — I69398 Other sequelae of cerebral infarction: Secondary | ICD-10-CM | POA: Diagnosis not present

## 2020-06-05 DIAGNOSIS — I2510 Atherosclerotic heart disease of native coronary artery without angina pectoris: Secondary | ICD-10-CM | POA: Diagnosis not present

## 2020-06-05 DIAGNOSIS — E785 Hyperlipidemia, unspecified: Secondary | ICD-10-CM | POA: Diagnosis not present

## 2020-06-05 DIAGNOSIS — I69322 Dysarthria following cerebral infarction: Secondary | ICD-10-CM | POA: Diagnosis not present

## 2020-06-05 DIAGNOSIS — R7301 Impaired fasting glucose: Secondary | ICD-10-CM | POA: Diagnosis not present

## 2020-06-05 DIAGNOSIS — Z9981 Dependence on supplemental oxygen: Secondary | ICD-10-CM | POA: Diagnosis not present

## 2020-06-05 DIAGNOSIS — J449 Chronic obstructive pulmonary disease, unspecified: Secondary | ICD-10-CM | POA: Diagnosis not present

## 2020-06-05 DIAGNOSIS — Z95820 Peripheral vascular angioplasty status with implants and grafts: Secondary | ICD-10-CM | POA: Diagnosis not present

## 2020-06-05 DIAGNOSIS — Z7901 Long term (current) use of anticoagulants: Secondary | ICD-10-CM | POA: Diagnosis not present

## 2020-06-05 DIAGNOSIS — G629 Polyneuropathy, unspecified: Secondary | ICD-10-CM | POA: Diagnosis not present

## 2020-06-05 DIAGNOSIS — I87323 Chronic venous hypertension (idiopathic) with inflammation of bilateral lower extremity: Secondary | ICD-10-CM | POA: Diagnosis not present

## 2020-06-05 DIAGNOSIS — R2681 Unsteadiness on feet: Secondary | ICD-10-CM | POA: Diagnosis not present

## 2020-06-05 DIAGNOSIS — Z87891 Personal history of nicotine dependence: Secondary | ICD-10-CM | POA: Diagnosis not present

## 2020-06-05 DIAGNOSIS — I130 Hypertensive heart and chronic kidney disease with heart failure and stage 1 through stage 4 chronic kidney disease, or unspecified chronic kidney disease: Secondary | ICD-10-CM | POA: Diagnosis not present

## 2020-06-05 DIAGNOSIS — I482 Chronic atrial fibrillation, unspecified: Secondary | ICD-10-CM | POA: Diagnosis not present

## 2020-06-05 DIAGNOSIS — F419 Anxiety disorder, unspecified: Secondary | ICD-10-CM | POA: Diagnosis not present

## 2020-06-05 DIAGNOSIS — I252 Old myocardial infarction: Secondary | ICD-10-CM | POA: Diagnosis not present

## 2020-06-05 DIAGNOSIS — Z79891 Long term (current) use of opiate analgesic: Secondary | ICD-10-CM | POA: Diagnosis not present

## 2020-06-05 DIAGNOSIS — Z9181 History of falling: Secondary | ICD-10-CM | POA: Diagnosis not present

## 2020-06-06 DIAGNOSIS — I130 Hypertensive heart and chronic kidney disease with heart failure and stage 1 through stage 4 chronic kidney disease, or unspecified chronic kidney disease: Secondary | ICD-10-CM | POA: Diagnosis not present

## 2020-06-06 DIAGNOSIS — I482 Chronic atrial fibrillation, unspecified: Secondary | ICD-10-CM | POA: Diagnosis not present

## 2020-06-06 DIAGNOSIS — J449 Chronic obstructive pulmonary disease, unspecified: Secondary | ICD-10-CM | POA: Diagnosis not present

## 2020-06-06 DIAGNOSIS — I5033 Acute on chronic diastolic (congestive) heart failure: Secondary | ICD-10-CM | POA: Diagnosis not present

## 2020-06-06 DIAGNOSIS — N1832 Chronic kidney disease, stage 3b: Secondary | ICD-10-CM | POA: Diagnosis not present

## 2020-06-06 DIAGNOSIS — I87323 Chronic venous hypertension (idiopathic) with inflammation of bilateral lower extremity: Secondary | ICD-10-CM | POA: Diagnosis not present

## 2020-06-10 DIAGNOSIS — I5033 Acute on chronic diastolic (congestive) heart failure: Secondary | ICD-10-CM | POA: Diagnosis not present

## 2020-06-10 DIAGNOSIS — T887XXA Unspecified adverse effect of drug or medicament, initial encounter: Secondary | ICD-10-CM | POA: Diagnosis not present

## 2020-06-10 DIAGNOSIS — I87323 Chronic venous hypertension (idiopathic) with inflammation of bilateral lower extremity: Secondary | ICD-10-CM | POA: Diagnosis not present

## 2020-06-10 DIAGNOSIS — N1832 Chronic kidney disease, stage 3b: Secondary | ICD-10-CM | POA: Diagnosis not present

## 2020-06-10 DIAGNOSIS — I482 Chronic atrial fibrillation, unspecified: Secondary | ICD-10-CM | POA: Diagnosis not present

## 2020-06-10 DIAGNOSIS — W19XXXA Unspecified fall, initial encounter: Secondary | ICD-10-CM | POA: Diagnosis not present

## 2020-06-10 DIAGNOSIS — J449 Chronic obstructive pulmonary disease, unspecified: Secondary | ICD-10-CM | POA: Diagnosis not present

## 2020-06-10 DIAGNOSIS — I130 Hypertensive heart and chronic kidney disease with heart failure and stage 1 through stage 4 chronic kidney disease, or unspecified chronic kidney disease: Secondary | ICD-10-CM | POA: Diagnosis not present

## 2020-06-13 DIAGNOSIS — I87323 Chronic venous hypertension (idiopathic) with inflammation of bilateral lower extremity: Secondary | ICD-10-CM | POA: Diagnosis not present

## 2020-06-13 DIAGNOSIS — I130 Hypertensive heart and chronic kidney disease with heart failure and stage 1 through stage 4 chronic kidney disease, or unspecified chronic kidney disease: Secondary | ICD-10-CM | POA: Diagnosis not present

## 2020-06-13 DIAGNOSIS — N1832 Chronic kidney disease, stage 3b: Secondary | ICD-10-CM | POA: Diagnosis not present

## 2020-06-13 DIAGNOSIS — I482 Chronic atrial fibrillation, unspecified: Secondary | ICD-10-CM | POA: Diagnosis not present

## 2020-06-13 DIAGNOSIS — J449 Chronic obstructive pulmonary disease, unspecified: Secondary | ICD-10-CM | POA: Diagnosis not present

## 2020-06-13 DIAGNOSIS — I5033 Acute on chronic diastolic (congestive) heart failure: Secondary | ICD-10-CM | POA: Diagnosis not present

## 2020-06-17 DIAGNOSIS — I482 Chronic atrial fibrillation, unspecified: Secondary | ICD-10-CM | POA: Diagnosis not present

## 2020-06-17 DIAGNOSIS — J449 Chronic obstructive pulmonary disease, unspecified: Secondary | ICD-10-CM | POA: Diagnosis not present

## 2020-06-17 DIAGNOSIS — I87323 Chronic venous hypertension (idiopathic) with inflammation of bilateral lower extremity: Secondary | ICD-10-CM | POA: Diagnosis not present

## 2020-06-17 DIAGNOSIS — I5033 Acute on chronic diastolic (congestive) heart failure: Secondary | ICD-10-CM | POA: Diagnosis not present

## 2020-06-17 DIAGNOSIS — I130 Hypertensive heart and chronic kidney disease with heart failure and stage 1 through stage 4 chronic kidney disease, or unspecified chronic kidney disease: Secondary | ICD-10-CM | POA: Diagnosis not present

## 2020-06-17 DIAGNOSIS — N1832 Chronic kidney disease, stage 3b: Secondary | ICD-10-CM | POA: Diagnosis not present

## 2020-06-20 DIAGNOSIS — I87323 Chronic venous hypertension (idiopathic) with inflammation of bilateral lower extremity: Secondary | ICD-10-CM | POA: Diagnosis not present

## 2020-06-20 DIAGNOSIS — I482 Chronic atrial fibrillation, unspecified: Secondary | ICD-10-CM | POA: Diagnosis not present

## 2020-06-20 DIAGNOSIS — N1832 Chronic kidney disease, stage 3b: Secondary | ICD-10-CM | POA: Diagnosis not present

## 2020-06-20 DIAGNOSIS — J449 Chronic obstructive pulmonary disease, unspecified: Secondary | ICD-10-CM | POA: Diagnosis not present

## 2020-06-20 DIAGNOSIS — I5033 Acute on chronic diastolic (congestive) heart failure: Secondary | ICD-10-CM | POA: Diagnosis not present

## 2020-06-20 DIAGNOSIS — I130 Hypertensive heart and chronic kidney disease with heart failure and stage 1 through stage 4 chronic kidney disease, or unspecified chronic kidney disease: Secondary | ICD-10-CM | POA: Diagnosis not present

## 2020-06-22 DIAGNOSIS — E039 Hypothyroidism, unspecified: Secondary | ICD-10-CM | POA: Diagnosis not present

## 2020-06-22 DIAGNOSIS — J449 Chronic obstructive pulmonary disease, unspecified: Secondary | ICD-10-CM | POA: Diagnosis not present

## 2020-06-22 DIAGNOSIS — I5022 Chronic systolic (congestive) heart failure: Secondary | ICD-10-CM | POA: Diagnosis not present

## 2020-06-22 DIAGNOSIS — N184 Chronic kidney disease, stage 4 (severe): Secondary | ICD-10-CM | POA: Diagnosis not present

## 2020-06-24 DIAGNOSIS — I482 Chronic atrial fibrillation, unspecified: Secondary | ICD-10-CM | POA: Diagnosis not present

## 2020-06-24 DIAGNOSIS — I130 Hypertensive heart and chronic kidney disease with heart failure and stage 1 through stage 4 chronic kidney disease, or unspecified chronic kidney disease: Secondary | ICD-10-CM | POA: Diagnosis not present

## 2020-06-24 DIAGNOSIS — I87323 Chronic venous hypertension (idiopathic) with inflammation of bilateral lower extremity: Secondary | ICD-10-CM | POA: Diagnosis not present

## 2020-06-24 DIAGNOSIS — J449 Chronic obstructive pulmonary disease, unspecified: Secondary | ICD-10-CM | POA: Diagnosis not present

## 2020-06-24 DIAGNOSIS — N1832 Chronic kidney disease, stage 3b: Secondary | ICD-10-CM | POA: Diagnosis not present

## 2020-06-24 DIAGNOSIS — I5033 Acute on chronic diastolic (congestive) heart failure: Secondary | ICD-10-CM | POA: Diagnosis not present

## 2020-06-26 DIAGNOSIS — I87323 Chronic venous hypertension (idiopathic) with inflammation of bilateral lower extremity: Secondary | ICD-10-CM | POA: Diagnosis not present

## 2020-06-26 DIAGNOSIS — I482 Chronic atrial fibrillation, unspecified: Secondary | ICD-10-CM | POA: Diagnosis not present

## 2020-06-26 DIAGNOSIS — N1832 Chronic kidney disease, stage 3b: Secondary | ICD-10-CM | POA: Diagnosis not present

## 2020-06-26 DIAGNOSIS — J449 Chronic obstructive pulmonary disease, unspecified: Secondary | ICD-10-CM | POA: Diagnosis not present

## 2020-06-26 DIAGNOSIS — I5033 Acute on chronic diastolic (congestive) heart failure: Secondary | ICD-10-CM | POA: Diagnosis not present

## 2020-06-26 DIAGNOSIS — I130 Hypertensive heart and chronic kidney disease with heart failure and stage 1 through stage 4 chronic kidney disease, or unspecified chronic kidney disease: Secondary | ICD-10-CM | POA: Diagnosis not present

## 2020-07-01 DIAGNOSIS — I130 Hypertensive heart and chronic kidney disease with heart failure and stage 1 through stage 4 chronic kidney disease, or unspecified chronic kidney disease: Secondary | ICD-10-CM | POA: Diagnosis not present

## 2020-07-01 DIAGNOSIS — I482 Chronic atrial fibrillation, unspecified: Secondary | ICD-10-CM | POA: Diagnosis not present

## 2020-07-01 DIAGNOSIS — N1832 Chronic kidney disease, stage 3b: Secondary | ICD-10-CM | POA: Diagnosis not present

## 2020-07-01 DIAGNOSIS — J449 Chronic obstructive pulmonary disease, unspecified: Secondary | ICD-10-CM | POA: Diagnosis not present

## 2020-07-01 DIAGNOSIS — I5033 Acute on chronic diastolic (congestive) heart failure: Secondary | ICD-10-CM | POA: Diagnosis not present

## 2020-07-01 DIAGNOSIS — I87323 Chronic venous hypertension (idiopathic) with inflammation of bilateral lower extremity: Secondary | ICD-10-CM | POA: Diagnosis not present

## 2020-07-04 DIAGNOSIS — I87323 Chronic venous hypertension (idiopathic) with inflammation of bilateral lower extremity: Secondary | ICD-10-CM | POA: Diagnosis not present

## 2020-07-04 DIAGNOSIS — N1832 Chronic kidney disease, stage 3b: Secondary | ICD-10-CM | POA: Diagnosis not present

## 2020-07-04 DIAGNOSIS — I130 Hypertensive heart and chronic kidney disease with heart failure and stage 1 through stage 4 chronic kidney disease, or unspecified chronic kidney disease: Secondary | ICD-10-CM | POA: Diagnosis not present

## 2020-07-04 DIAGNOSIS — I482 Chronic atrial fibrillation, unspecified: Secondary | ICD-10-CM | POA: Diagnosis not present

## 2020-07-04 DIAGNOSIS — J449 Chronic obstructive pulmonary disease, unspecified: Secondary | ICD-10-CM | POA: Diagnosis not present

## 2020-07-04 DIAGNOSIS — I5033 Acute on chronic diastolic (congestive) heart failure: Secondary | ICD-10-CM | POA: Diagnosis not present

## 2020-07-05 DIAGNOSIS — I69322 Dysarthria following cerebral infarction: Secondary | ICD-10-CM | POA: Diagnosis not present

## 2020-07-05 DIAGNOSIS — F419 Anxiety disorder, unspecified: Secondary | ICD-10-CM | POA: Diagnosis not present

## 2020-07-05 DIAGNOSIS — I69398 Other sequelae of cerebral infarction: Secondary | ICD-10-CM | POA: Diagnosis not present

## 2020-07-05 DIAGNOSIS — R2681 Unsteadiness on feet: Secondary | ICD-10-CM | POA: Diagnosis not present

## 2020-07-05 DIAGNOSIS — Z79891 Long term (current) use of opiate analgesic: Secondary | ICD-10-CM | POA: Diagnosis not present

## 2020-07-05 DIAGNOSIS — J449 Chronic obstructive pulmonary disease, unspecified: Secondary | ICD-10-CM | POA: Diagnosis not present

## 2020-07-05 DIAGNOSIS — N1832 Chronic kidney disease, stage 3b: Secondary | ICD-10-CM | POA: Diagnosis not present

## 2020-07-05 DIAGNOSIS — E785 Hyperlipidemia, unspecified: Secondary | ICD-10-CM | POA: Diagnosis not present

## 2020-07-05 DIAGNOSIS — I482 Chronic atrial fibrillation, unspecified: Secondary | ICD-10-CM | POA: Diagnosis not present

## 2020-07-05 DIAGNOSIS — I130 Hypertensive heart and chronic kidney disease with heart failure and stage 1 through stage 4 chronic kidney disease, or unspecified chronic kidney disease: Secondary | ICD-10-CM | POA: Diagnosis not present

## 2020-07-05 DIAGNOSIS — Z87891 Personal history of nicotine dependence: Secondary | ICD-10-CM | POA: Diagnosis not present

## 2020-07-05 DIAGNOSIS — I2510 Atherosclerotic heart disease of native coronary artery without angina pectoris: Secondary | ICD-10-CM | POA: Diagnosis not present

## 2020-07-05 DIAGNOSIS — R7301 Impaired fasting glucose: Secondary | ICD-10-CM | POA: Diagnosis not present

## 2020-07-05 DIAGNOSIS — I5033 Acute on chronic diastolic (congestive) heart failure: Secondary | ICD-10-CM | POA: Diagnosis not present

## 2020-07-05 DIAGNOSIS — E039 Hypothyroidism, unspecified: Secondary | ICD-10-CM | POA: Diagnosis not present

## 2020-07-05 DIAGNOSIS — I252 Old myocardial infarction: Secondary | ICD-10-CM | POA: Diagnosis not present

## 2020-07-05 DIAGNOSIS — Z7901 Long term (current) use of anticoagulants: Secondary | ICD-10-CM | POA: Diagnosis not present

## 2020-07-05 DIAGNOSIS — Z9181 History of falling: Secondary | ICD-10-CM | POA: Diagnosis not present

## 2020-07-05 DIAGNOSIS — Z95820 Peripheral vascular angioplasty status with implants and grafts: Secondary | ICD-10-CM | POA: Diagnosis not present

## 2020-07-05 DIAGNOSIS — G629 Polyneuropathy, unspecified: Secondary | ICD-10-CM | POA: Diagnosis not present

## 2020-07-05 DIAGNOSIS — I87323 Chronic venous hypertension (idiopathic) with inflammation of bilateral lower extremity: Secondary | ICD-10-CM | POA: Diagnosis not present

## 2020-07-05 DIAGNOSIS — Z9981 Dependence on supplemental oxygen: Secondary | ICD-10-CM | POA: Diagnosis not present

## 2020-07-08 DIAGNOSIS — I130 Hypertensive heart and chronic kidney disease with heart failure and stage 1 through stage 4 chronic kidney disease, or unspecified chronic kidney disease: Secondary | ICD-10-CM | POA: Diagnosis not present

## 2020-07-08 DIAGNOSIS — N1832 Chronic kidney disease, stage 3b: Secondary | ICD-10-CM | POA: Diagnosis not present

## 2020-07-08 DIAGNOSIS — J449 Chronic obstructive pulmonary disease, unspecified: Secondary | ICD-10-CM | POA: Diagnosis not present

## 2020-07-08 DIAGNOSIS — I87323 Chronic venous hypertension (idiopathic) with inflammation of bilateral lower extremity: Secondary | ICD-10-CM | POA: Diagnosis not present

## 2020-07-08 DIAGNOSIS — I482 Chronic atrial fibrillation, unspecified: Secondary | ICD-10-CM | POA: Diagnosis not present

## 2020-07-08 DIAGNOSIS — I5033 Acute on chronic diastolic (congestive) heart failure: Secondary | ICD-10-CM | POA: Diagnosis not present

## 2020-07-11 DIAGNOSIS — I5033 Acute on chronic diastolic (congestive) heart failure: Secondary | ICD-10-CM | POA: Diagnosis not present

## 2020-07-11 DIAGNOSIS — I130 Hypertensive heart and chronic kidney disease with heart failure and stage 1 through stage 4 chronic kidney disease, or unspecified chronic kidney disease: Secondary | ICD-10-CM | POA: Diagnosis not present

## 2020-07-11 DIAGNOSIS — J449 Chronic obstructive pulmonary disease, unspecified: Secondary | ICD-10-CM | POA: Diagnosis not present

## 2020-07-11 DIAGNOSIS — I482 Chronic atrial fibrillation, unspecified: Secondary | ICD-10-CM | POA: Diagnosis not present

## 2020-07-11 DIAGNOSIS — N1832 Chronic kidney disease, stage 3b: Secondary | ICD-10-CM | POA: Diagnosis not present

## 2020-07-11 DIAGNOSIS — I87323 Chronic venous hypertension (idiopathic) with inflammation of bilateral lower extremity: Secondary | ICD-10-CM | POA: Diagnosis not present

## 2020-07-15 DIAGNOSIS — J449 Chronic obstructive pulmonary disease, unspecified: Secondary | ICD-10-CM | POA: Diagnosis not present

## 2020-07-15 DIAGNOSIS — N1832 Chronic kidney disease, stage 3b: Secondary | ICD-10-CM | POA: Diagnosis not present

## 2020-07-15 DIAGNOSIS — I5033 Acute on chronic diastolic (congestive) heart failure: Secondary | ICD-10-CM | POA: Diagnosis not present

## 2020-07-15 DIAGNOSIS — I482 Chronic atrial fibrillation, unspecified: Secondary | ICD-10-CM | POA: Diagnosis not present

## 2020-07-15 DIAGNOSIS — I130 Hypertensive heart and chronic kidney disease with heart failure and stage 1 through stage 4 chronic kidney disease, or unspecified chronic kidney disease: Secondary | ICD-10-CM | POA: Diagnosis not present

## 2020-07-15 DIAGNOSIS — I87323 Chronic venous hypertension (idiopathic) with inflammation of bilateral lower extremity: Secondary | ICD-10-CM | POA: Diagnosis not present

## 2020-07-18 DIAGNOSIS — I87323 Chronic venous hypertension (idiopathic) with inflammation of bilateral lower extremity: Secondary | ICD-10-CM | POA: Diagnosis not present

## 2020-07-18 DIAGNOSIS — I130 Hypertensive heart and chronic kidney disease with heart failure and stage 1 through stage 4 chronic kidney disease, or unspecified chronic kidney disease: Secondary | ICD-10-CM | POA: Diagnosis not present

## 2020-07-18 DIAGNOSIS — J449 Chronic obstructive pulmonary disease, unspecified: Secondary | ICD-10-CM | POA: Diagnosis not present

## 2020-07-18 DIAGNOSIS — I482 Chronic atrial fibrillation, unspecified: Secondary | ICD-10-CM | POA: Diagnosis not present

## 2020-07-18 DIAGNOSIS — N1832 Chronic kidney disease, stage 3b: Secondary | ICD-10-CM | POA: Diagnosis not present

## 2020-07-18 DIAGNOSIS — I5033 Acute on chronic diastolic (congestive) heart failure: Secondary | ICD-10-CM | POA: Diagnosis not present

## 2020-07-23 DIAGNOSIS — I5033 Acute on chronic diastolic (congestive) heart failure: Secondary | ICD-10-CM | POA: Diagnosis not present

## 2020-07-23 DIAGNOSIS — N1832 Chronic kidney disease, stage 3b: Secondary | ICD-10-CM | POA: Diagnosis not present

## 2020-07-23 DIAGNOSIS — E039 Hypothyroidism, unspecified: Secondary | ICD-10-CM | POA: Diagnosis not present

## 2020-07-23 DIAGNOSIS — I87323 Chronic venous hypertension (idiopathic) with inflammation of bilateral lower extremity: Secondary | ICD-10-CM | POA: Diagnosis not present

## 2020-07-23 DIAGNOSIS — I5022 Chronic systolic (congestive) heart failure: Secondary | ICD-10-CM | POA: Diagnosis not present

## 2020-07-23 DIAGNOSIS — I130 Hypertensive heart and chronic kidney disease with heart failure and stage 1 through stage 4 chronic kidney disease, or unspecified chronic kidney disease: Secondary | ICD-10-CM | POA: Diagnosis not present

## 2020-07-23 DIAGNOSIS — N184 Chronic kidney disease, stage 4 (severe): Secondary | ICD-10-CM | POA: Diagnosis not present

## 2020-07-23 DIAGNOSIS — J449 Chronic obstructive pulmonary disease, unspecified: Secondary | ICD-10-CM | POA: Diagnosis not present

## 2020-07-23 DIAGNOSIS — I482 Chronic atrial fibrillation, unspecified: Secondary | ICD-10-CM | POA: Diagnosis not present

## 2020-07-25 DIAGNOSIS — I5033 Acute on chronic diastolic (congestive) heart failure: Secondary | ICD-10-CM | POA: Diagnosis not present

## 2020-07-25 DIAGNOSIS — N1832 Chronic kidney disease, stage 3b: Secondary | ICD-10-CM | POA: Diagnosis not present

## 2020-07-25 DIAGNOSIS — I482 Chronic atrial fibrillation, unspecified: Secondary | ICD-10-CM | POA: Diagnosis not present

## 2020-07-25 DIAGNOSIS — I87323 Chronic venous hypertension (idiopathic) with inflammation of bilateral lower extremity: Secondary | ICD-10-CM | POA: Diagnosis not present

## 2020-07-25 DIAGNOSIS — J449 Chronic obstructive pulmonary disease, unspecified: Secondary | ICD-10-CM | POA: Diagnosis not present

## 2020-07-25 DIAGNOSIS — I130 Hypertensive heart and chronic kidney disease with heart failure and stage 1 through stage 4 chronic kidney disease, or unspecified chronic kidney disease: Secondary | ICD-10-CM | POA: Diagnosis not present

## 2020-07-30 DIAGNOSIS — N1832 Chronic kidney disease, stage 3b: Secondary | ICD-10-CM | POA: Diagnosis not present

## 2020-07-30 DIAGNOSIS — I87323 Chronic venous hypertension (idiopathic) with inflammation of bilateral lower extremity: Secondary | ICD-10-CM | POA: Diagnosis not present

## 2020-07-30 DIAGNOSIS — I5033 Acute on chronic diastolic (congestive) heart failure: Secondary | ICD-10-CM | POA: Diagnosis not present

## 2020-07-30 DIAGNOSIS — I482 Chronic atrial fibrillation, unspecified: Secondary | ICD-10-CM | POA: Diagnosis not present

## 2020-07-30 DIAGNOSIS — J449 Chronic obstructive pulmonary disease, unspecified: Secondary | ICD-10-CM | POA: Diagnosis not present

## 2020-07-30 DIAGNOSIS — I130 Hypertensive heart and chronic kidney disease with heart failure and stage 1 through stage 4 chronic kidney disease, or unspecified chronic kidney disease: Secondary | ICD-10-CM | POA: Diagnosis not present

## 2020-08-01 DIAGNOSIS — I87323 Chronic venous hypertension (idiopathic) with inflammation of bilateral lower extremity: Secondary | ICD-10-CM | POA: Diagnosis not present

## 2020-08-01 DIAGNOSIS — I482 Chronic atrial fibrillation, unspecified: Secondary | ICD-10-CM | POA: Diagnosis not present

## 2020-08-01 DIAGNOSIS — I130 Hypertensive heart and chronic kidney disease with heart failure and stage 1 through stage 4 chronic kidney disease, or unspecified chronic kidney disease: Secondary | ICD-10-CM | POA: Diagnosis not present

## 2020-08-01 DIAGNOSIS — J449 Chronic obstructive pulmonary disease, unspecified: Secondary | ICD-10-CM | POA: Diagnosis not present

## 2020-08-01 DIAGNOSIS — I5033 Acute on chronic diastolic (congestive) heart failure: Secondary | ICD-10-CM | POA: Diagnosis not present

## 2020-08-01 DIAGNOSIS — N1832 Chronic kidney disease, stage 3b: Secondary | ICD-10-CM | POA: Diagnosis not present

## 2020-08-04 DIAGNOSIS — I87323 Chronic venous hypertension (idiopathic) with inflammation of bilateral lower extremity: Secondary | ICD-10-CM | POA: Diagnosis not present

## 2020-08-04 DIAGNOSIS — F419 Anxiety disorder, unspecified: Secondary | ICD-10-CM | POA: Diagnosis not present

## 2020-08-04 DIAGNOSIS — Z95820 Peripheral vascular angioplasty status with implants and grafts: Secondary | ICD-10-CM | POA: Diagnosis not present

## 2020-08-04 DIAGNOSIS — N1832 Chronic kidney disease, stage 3b: Secondary | ICD-10-CM | POA: Diagnosis not present

## 2020-08-04 DIAGNOSIS — J449 Chronic obstructive pulmonary disease, unspecified: Secondary | ICD-10-CM | POA: Diagnosis not present

## 2020-08-04 DIAGNOSIS — Z79891 Long term (current) use of opiate analgesic: Secondary | ICD-10-CM | POA: Diagnosis not present

## 2020-08-04 DIAGNOSIS — I5033 Acute on chronic diastolic (congestive) heart failure: Secondary | ICD-10-CM | POA: Diagnosis not present

## 2020-08-04 DIAGNOSIS — I130 Hypertensive heart and chronic kidney disease with heart failure and stage 1 through stage 4 chronic kidney disease, or unspecified chronic kidney disease: Secondary | ICD-10-CM | POA: Diagnosis not present

## 2020-08-04 DIAGNOSIS — I252 Old myocardial infarction: Secondary | ICD-10-CM | POA: Diagnosis not present

## 2020-08-04 DIAGNOSIS — I482 Chronic atrial fibrillation, unspecified: Secondary | ICD-10-CM | POA: Diagnosis not present

## 2020-08-04 DIAGNOSIS — I69398 Other sequelae of cerebral infarction: Secondary | ICD-10-CM | POA: Diagnosis not present

## 2020-08-04 DIAGNOSIS — E785 Hyperlipidemia, unspecified: Secondary | ICD-10-CM | POA: Diagnosis not present

## 2020-08-04 DIAGNOSIS — R7301 Impaired fasting glucose: Secondary | ICD-10-CM | POA: Diagnosis not present

## 2020-08-04 DIAGNOSIS — Z87891 Personal history of nicotine dependence: Secondary | ICD-10-CM | POA: Diagnosis not present

## 2020-08-04 DIAGNOSIS — I2510 Atherosclerotic heart disease of native coronary artery without angina pectoris: Secondary | ICD-10-CM | POA: Diagnosis not present

## 2020-08-04 DIAGNOSIS — Z9181 History of falling: Secondary | ICD-10-CM | POA: Diagnosis not present

## 2020-08-04 DIAGNOSIS — E039 Hypothyroidism, unspecified: Secondary | ICD-10-CM | POA: Diagnosis not present

## 2020-08-04 DIAGNOSIS — G629 Polyneuropathy, unspecified: Secondary | ICD-10-CM | POA: Diagnosis not present

## 2020-08-04 DIAGNOSIS — R2681 Unsteadiness on feet: Secondary | ICD-10-CM | POA: Diagnosis not present

## 2020-08-04 DIAGNOSIS — Z9981 Dependence on supplemental oxygen: Secondary | ICD-10-CM | POA: Diagnosis not present

## 2020-08-04 DIAGNOSIS — Z7901 Long term (current) use of anticoagulants: Secondary | ICD-10-CM | POA: Diagnosis not present

## 2020-08-04 DIAGNOSIS — I69322 Dysarthria following cerebral infarction: Secondary | ICD-10-CM | POA: Diagnosis not present

## 2020-08-05 DIAGNOSIS — J449 Chronic obstructive pulmonary disease, unspecified: Secondary | ICD-10-CM | POA: Diagnosis not present

## 2020-08-05 DIAGNOSIS — I5033 Acute on chronic diastolic (congestive) heart failure: Secondary | ICD-10-CM | POA: Diagnosis not present

## 2020-08-05 DIAGNOSIS — I87323 Chronic venous hypertension (idiopathic) with inflammation of bilateral lower extremity: Secondary | ICD-10-CM | POA: Diagnosis not present

## 2020-08-05 DIAGNOSIS — N1832 Chronic kidney disease, stage 3b: Secondary | ICD-10-CM | POA: Diagnosis not present

## 2020-08-05 DIAGNOSIS — I130 Hypertensive heart and chronic kidney disease with heart failure and stage 1 through stage 4 chronic kidney disease, or unspecified chronic kidney disease: Secondary | ICD-10-CM | POA: Diagnosis not present

## 2020-08-05 DIAGNOSIS — I482 Chronic atrial fibrillation, unspecified: Secondary | ICD-10-CM | POA: Diagnosis not present

## 2020-08-08 DIAGNOSIS — J449 Chronic obstructive pulmonary disease, unspecified: Secondary | ICD-10-CM | POA: Diagnosis not present

## 2020-08-08 DIAGNOSIS — I5033 Acute on chronic diastolic (congestive) heart failure: Secondary | ICD-10-CM | POA: Diagnosis not present

## 2020-08-08 DIAGNOSIS — I130 Hypertensive heart and chronic kidney disease with heart failure and stage 1 through stage 4 chronic kidney disease, or unspecified chronic kidney disease: Secondary | ICD-10-CM | POA: Diagnosis not present

## 2020-08-08 DIAGNOSIS — I87323 Chronic venous hypertension (idiopathic) with inflammation of bilateral lower extremity: Secondary | ICD-10-CM | POA: Diagnosis not present

## 2020-08-08 DIAGNOSIS — I482 Chronic atrial fibrillation, unspecified: Secondary | ICD-10-CM | POA: Diagnosis not present

## 2020-08-08 DIAGNOSIS — N1832 Chronic kidney disease, stage 3b: Secondary | ICD-10-CM | POA: Diagnosis not present

## 2020-08-12 DIAGNOSIS — I482 Chronic atrial fibrillation, unspecified: Secondary | ICD-10-CM | POA: Diagnosis not present

## 2020-08-12 DIAGNOSIS — N1832 Chronic kidney disease, stage 3b: Secondary | ICD-10-CM | POA: Diagnosis not present

## 2020-08-12 DIAGNOSIS — J449 Chronic obstructive pulmonary disease, unspecified: Secondary | ICD-10-CM | POA: Diagnosis not present

## 2020-08-12 DIAGNOSIS — I130 Hypertensive heart and chronic kidney disease with heart failure and stage 1 through stage 4 chronic kidney disease, or unspecified chronic kidney disease: Secondary | ICD-10-CM | POA: Diagnosis not present

## 2020-08-12 DIAGNOSIS — I87323 Chronic venous hypertension (idiopathic) with inflammation of bilateral lower extremity: Secondary | ICD-10-CM | POA: Diagnosis not present

## 2020-08-12 DIAGNOSIS — I5033 Acute on chronic diastolic (congestive) heart failure: Secondary | ICD-10-CM | POA: Diagnosis not present

## 2020-08-15 DIAGNOSIS — I130 Hypertensive heart and chronic kidney disease with heart failure and stage 1 through stage 4 chronic kidney disease, or unspecified chronic kidney disease: Secondary | ICD-10-CM | POA: Diagnosis not present

## 2020-08-15 DIAGNOSIS — I482 Chronic atrial fibrillation, unspecified: Secondary | ICD-10-CM | POA: Diagnosis not present

## 2020-08-15 DIAGNOSIS — N1832 Chronic kidney disease, stage 3b: Secondary | ICD-10-CM | POA: Diagnosis not present

## 2020-08-15 DIAGNOSIS — I5033 Acute on chronic diastolic (congestive) heart failure: Secondary | ICD-10-CM | POA: Diagnosis not present

## 2020-08-15 DIAGNOSIS — J449 Chronic obstructive pulmonary disease, unspecified: Secondary | ICD-10-CM | POA: Diagnosis not present

## 2020-08-15 DIAGNOSIS — I87323 Chronic venous hypertension (idiopathic) with inflammation of bilateral lower extremity: Secondary | ICD-10-CM | POA: Diagnosis not present

## 2020-08-19 DIAGNOSIS — N1832 Chronic kidney disease, stage 3b: Secondary | ICD-10-CM | POA: Diagnosis not present

## 2020-08-19 DIAGNOSIS — J449 Chronic obstructive pulmonary disease, unspecified: Secondary | ICD-10-CM | POA: Diagnosis not present

## 2020-08-19 DIAGNOSIS — I5033 Acute on chronic diastolic (congestive) heart failure: Secondary | ICD-10-CM | POA: Diagnosis not present

## 2020-08-19 DIAGNOSIS — I482 Chronic atrial fibrillation, unspecified: Secondary | ICD-10-CM | POA: Diagnosis not present

## 2020-08-19 DIAGNOSIS — I130 Hypertensive heart and chronic kidney disease with heart failure and stage 1 through stage 4 chronic kidney disease, or unspecified chronic kidney disease: Secondary | ICD-10-CM | POA: Diagnosis not present

## 2020-08-19 DIAGNOSIS — I87323 Chronic venous hypertension (idiopathic) with inflammation of bilateral lower extremity: Secondary | ICD-10-CM | POA: Diagnosis not present

## 2020-08-22 DIAGNOSIS — N1832 Chronic kidney disease, stage 3b: Secondary | ICD-10-CM | POA: Diagnosis not present

## 2020-08-22 DIAGNOSIS — E039 Hypothyroidism, unspecified: Secondary | ICD-10-CM | POA: Diagnosis not present

## 2020-08-22 DIAGNOSIS — I5033 Acute on chronic diastolic (congestive) heart failure: Secondary | ICD-10-CM | POA: Diagnosis not present

## 2020-08-22 DIAGNOSIS — I5022 Chronic systolic (congestive) heart failure: Secondary | ICD-10-CM | POA: Diagnosis not present

## 2020-08-22 DIAGNOSIS — J449 Chronic obstructive pulmonary disease, unspecified: Secondary | ICD-10-CM | POA: Diagnosis not present

## 2020-08-22 DIAGNOSIS — I482 Chronic atrial fibrillation, unspecified: Secondary | ICD-10-CM | POA: Diagnosis not present

## 2020-08-22 DIAGNOSIS — I87323 Chronic venous hypertension (idiopathic) with inflammation of bilateral lower extremity: Secondary | ICD-10-CM | POA: Diagnosis not present

## 2020-08-22 DIAGNOSIS — I130 Hypertensive heart and chronic kidney disease with heart failure and stage 1 through stage 4 chronic kidney disease, or unspecified chronic kidney disease: Secondary | ICD-10-CM | POA: Diagnosis not present

## 2020-08-22 DIAGNOSIS — N184 Chronic kidney disease, stage 4 (severe): Secondary | ICD-10-CM | POA: Diagnosis not present

## 2020-08-29 DIAGNOSIS — I482 Chronic atrial fibrillation, unspecified: Secondary | ICD-10-CM | POA: Diagnosis not present

## 2020-08-29 DIAGNOSIS — J449 Chronic obstructive pulmonary disease, unspecified: Secondary | ICD-10-CM | POA: Diagnosis not present

## 2020-08-29 DIAGNOSIS — I5033 Acute on chronic diastolic (congestive) heart failure: Secondary | ICD-10-CM | POA: Diagnosis not present

## 2020-08-29 DIAGNOSIS — I87323 Chronic venous hypertension (idiopathic) with inflammation of bilateral lower extremity: Secondary | ICD-10-CM | POA: Diagnosis not present

## 2020-08-29 DIAGNOSIS — I130 Hypertensive heart and chronic kidney disease with heart failure and stage 1 through stage 4 chronic kidney disease, or unspecified chronic kidney disease: Secondary | ICD-10-CM | POA: Diagnosis not present

## 2020-08-29 DIAGNOSIS — N1832 Chronic kidney disease, stage 3b: Secondary | ICD-10-CM | POA: Diagnosis not present

## 2020-08-30 DIAGNOSIS — J449 Chronic obstructive pulmonary disease, unspecified: Secondary | ICD-10-CM | POA: Diagnosis not present

## 2020-08-30 DIAGNOSIS — I130 Hypertensive heart and chronic kidney disease with heart failure and stage 1 through stage 4 chronic kidney disease, or unspecified chronic kidney disease: Secondary | ICD-10-CM | POA: Diagnosis not present

## 2020-08-30 DIAGNOSIS — I87323 Chronic venous hypertension (idiopathic) with inflammation of bilateral lower extremity: Secondary | ICD-10-CM | POA: Diagnosis not present

## 2020-08-30 DIAGNOSIS — I482 Chronic atrial fibrillation, unspecified: Secondary | ICD-10-CM | POA: Diagnosis not present

## 2020-08-30 DIAGNOSIS — N1832 Chronic kidney disease, stage 3b: Secondary | ICD-10-CM | POA: Diagnosis not present

## 2020-08-30 DIAGNOSIS — I5033 Acute on chronic diastolic (congestive) heart failure: Secondary | ICD-10-CM | POA: Diagnosis not present

## 2020-09-22 DIAGNOSIS — E039 Hypothyroidism, unspecified: Secondary | ICD-10-CM | POA: Diagnosis not present

## 2020-09-22 DIAGNOSIS — I5022 Chronic systolic (congestive) heart failure: Secondary | ICD-10-CM | POA: Diagnosis not present

## 2020-09-22 DIAGNOSIS — N184 Chronic kidney disease, stage 4 (severe): Secondary | ICD-10-CM | POA: Diagnosis not present

## 2020-09-22 DIAGNOSIS — J449 Chronic obstructive pulmonary disease, unspecified: Secondary | ICD-10-CM | POA: Diagnosis not present

## 2020-10-23 DIAGNOSIS — J449 Chronic obstructive pulmonary disease, unspecified: Secondary | ICD-10-CM | POA: Diagnosis not present

## 2020-10-23 DIAGNOSIS — N184 Chronic kidney disease, stage 4 (severe): Secondary | ICD-10-CM | POA: Diagnosis not present

## 2020-10-23 DIAGNOSIS — E039 Hypothyroidism, unspecified: Secondary | ICD-10-CM | POA: Diagnosis not present

## 2020-10-23 DIAGNOSIS — I5022 Chronic systolic (congestive) heart failure: Secondary | ICD-10-CM | POA: Diagnosis not present

## 2020-11-06 DIAGNOSIS — W19XXXA Unspecified fall, initial encounter: Secondary | ICD-10-CM | POA: Diagnosis not present

## 2020-11-06 DIAGNOSIS — R062 Wheezing: Secondary | ICD-10-CM | POA: Diagnosis not present

## 2020-11-10 DIAGNOSIS — R279 Unspecified lack of coordination: Secondary | ICD-10-CM | POA: Diagnosis not present

## 2020-11-10 DIAGNOSIS — R5381 Other malaise: Secondary | ICD-10-CM | POA: Diagnosis not present

## 2020-11-10 DIAGNOSIS — Z7401 Bed confinement status: Secondary | ICD-10-CM | POA: Diagnosis not present

## 2020-11-23 DEATH — deceased

## 2021-05-18 IMAGING — DX DG HIP (WITH OR WITHOUT PELVIS) 2-3V*R*
3 series · 3 of 3 positions shown · non-contrast
Comparison: 10/07/2006

CLINICAL DATA: Recent fall, found down

EXAM:
DG HIP (WITH OR WITHOUT PELVIS) 2-3V RIGHT

[pelvis ap]
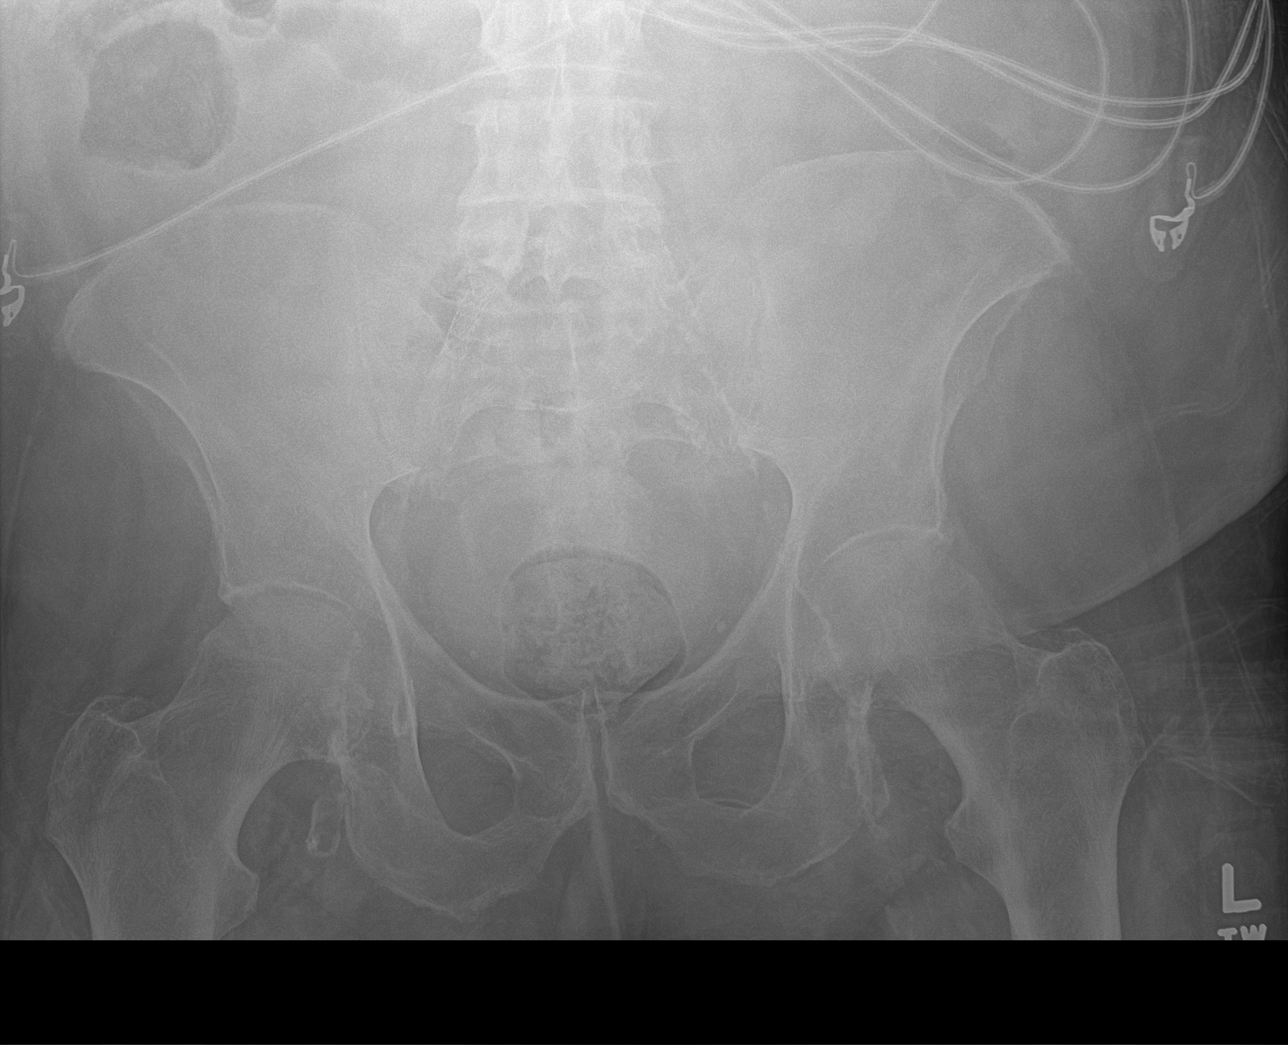

[hip ap]
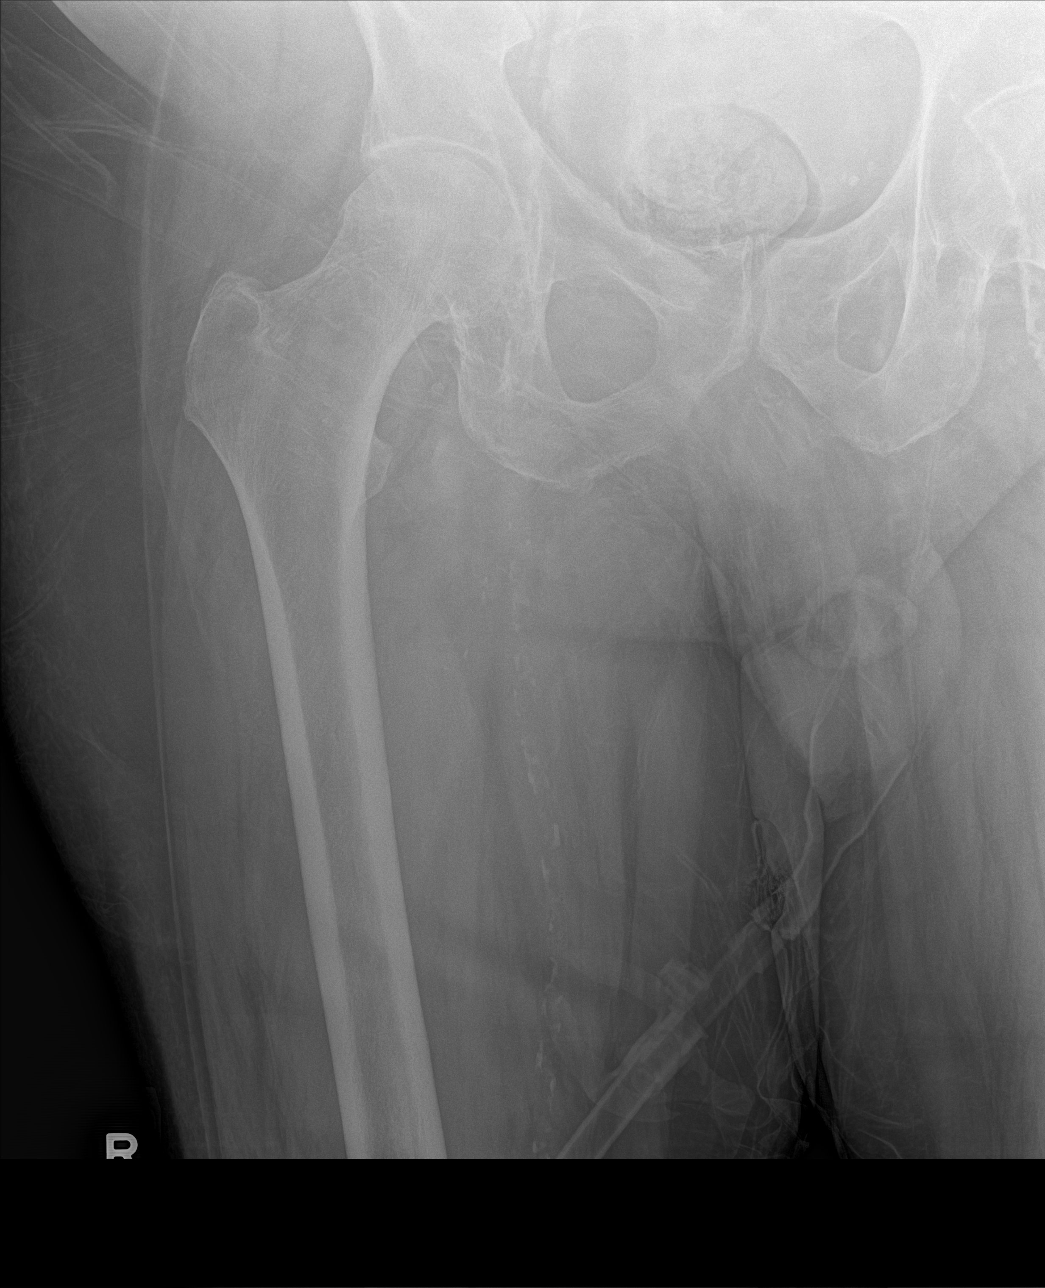

[hip lat]
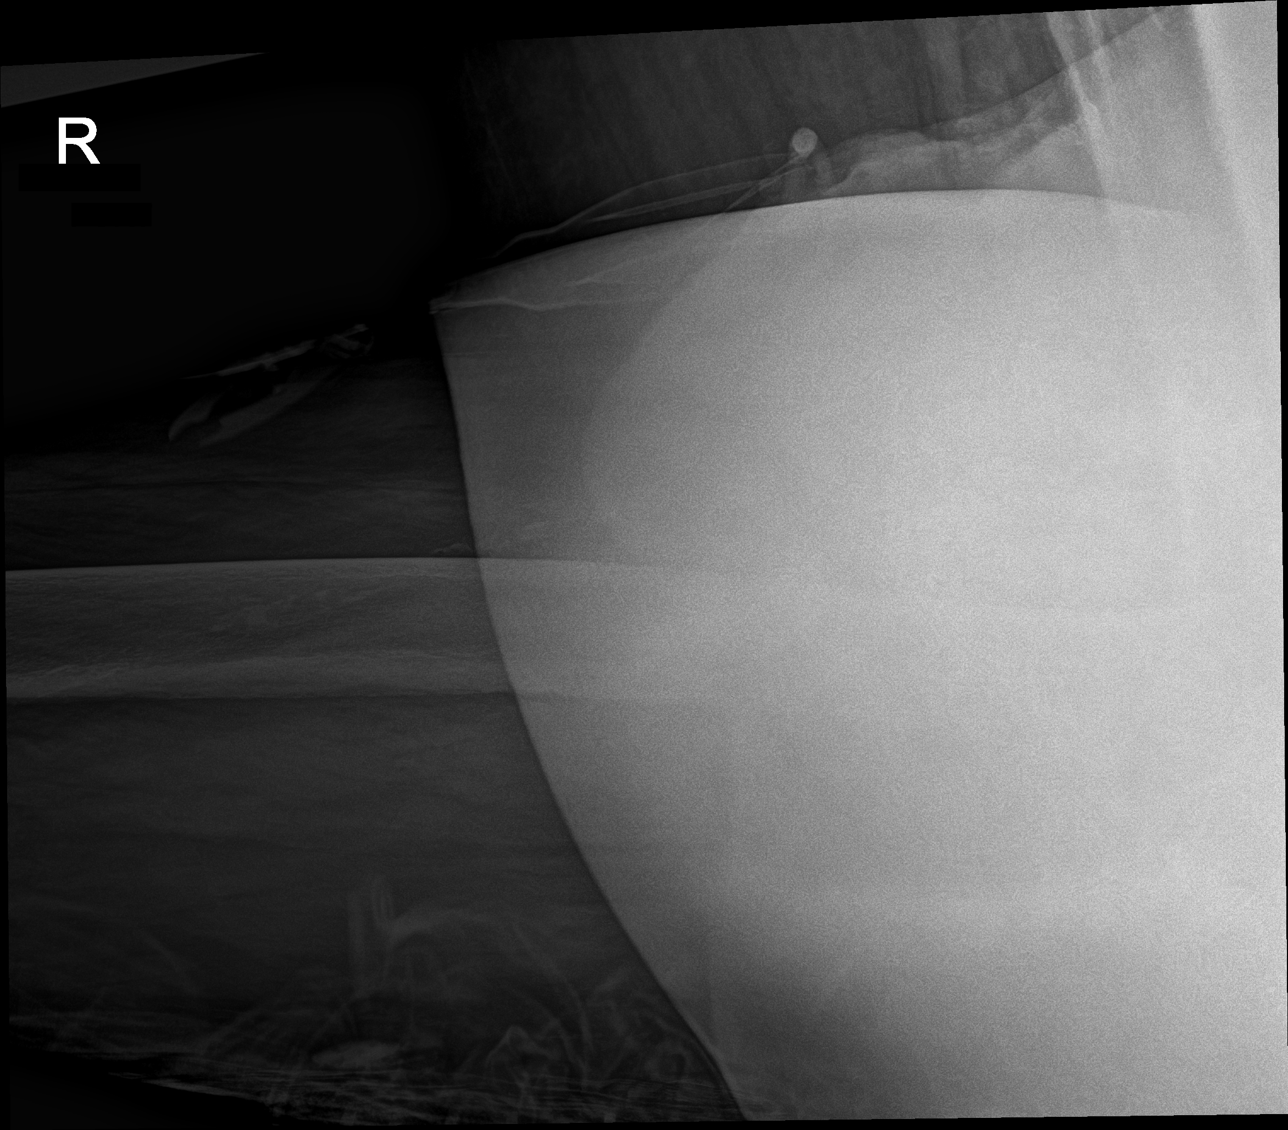

[3 of 3 positions shown; findings below may reference images not displayed]

FINDINGS: Bones are osteopenic and degenerative changes noted of the lower
lumbar spine, SI joints and both hips. Hips appear symmetric and
intact. No displaced fracture or malalignment. Bony pelvis intact.
Cross-table lateral view is limited because of overlying soft
tissues. Aortoiliac and femoral atherosclerosis noted.
IMPRESSION: Degenerative changes and osteopenia. No definite acute osseous
finding by plain radiography

Atherosclerosis

## 2022-06-28 IMAGING — DX DG CHEST 1V PORT
1 series · 1 of 1 positions shown · non-contrast
Comparison: 12/29/2019

CLINICAL DATA: Progressive shortness of breath for 3 months.

EXAM:
PORTABLE CHEST 1 VIEW

[chest ap grid]
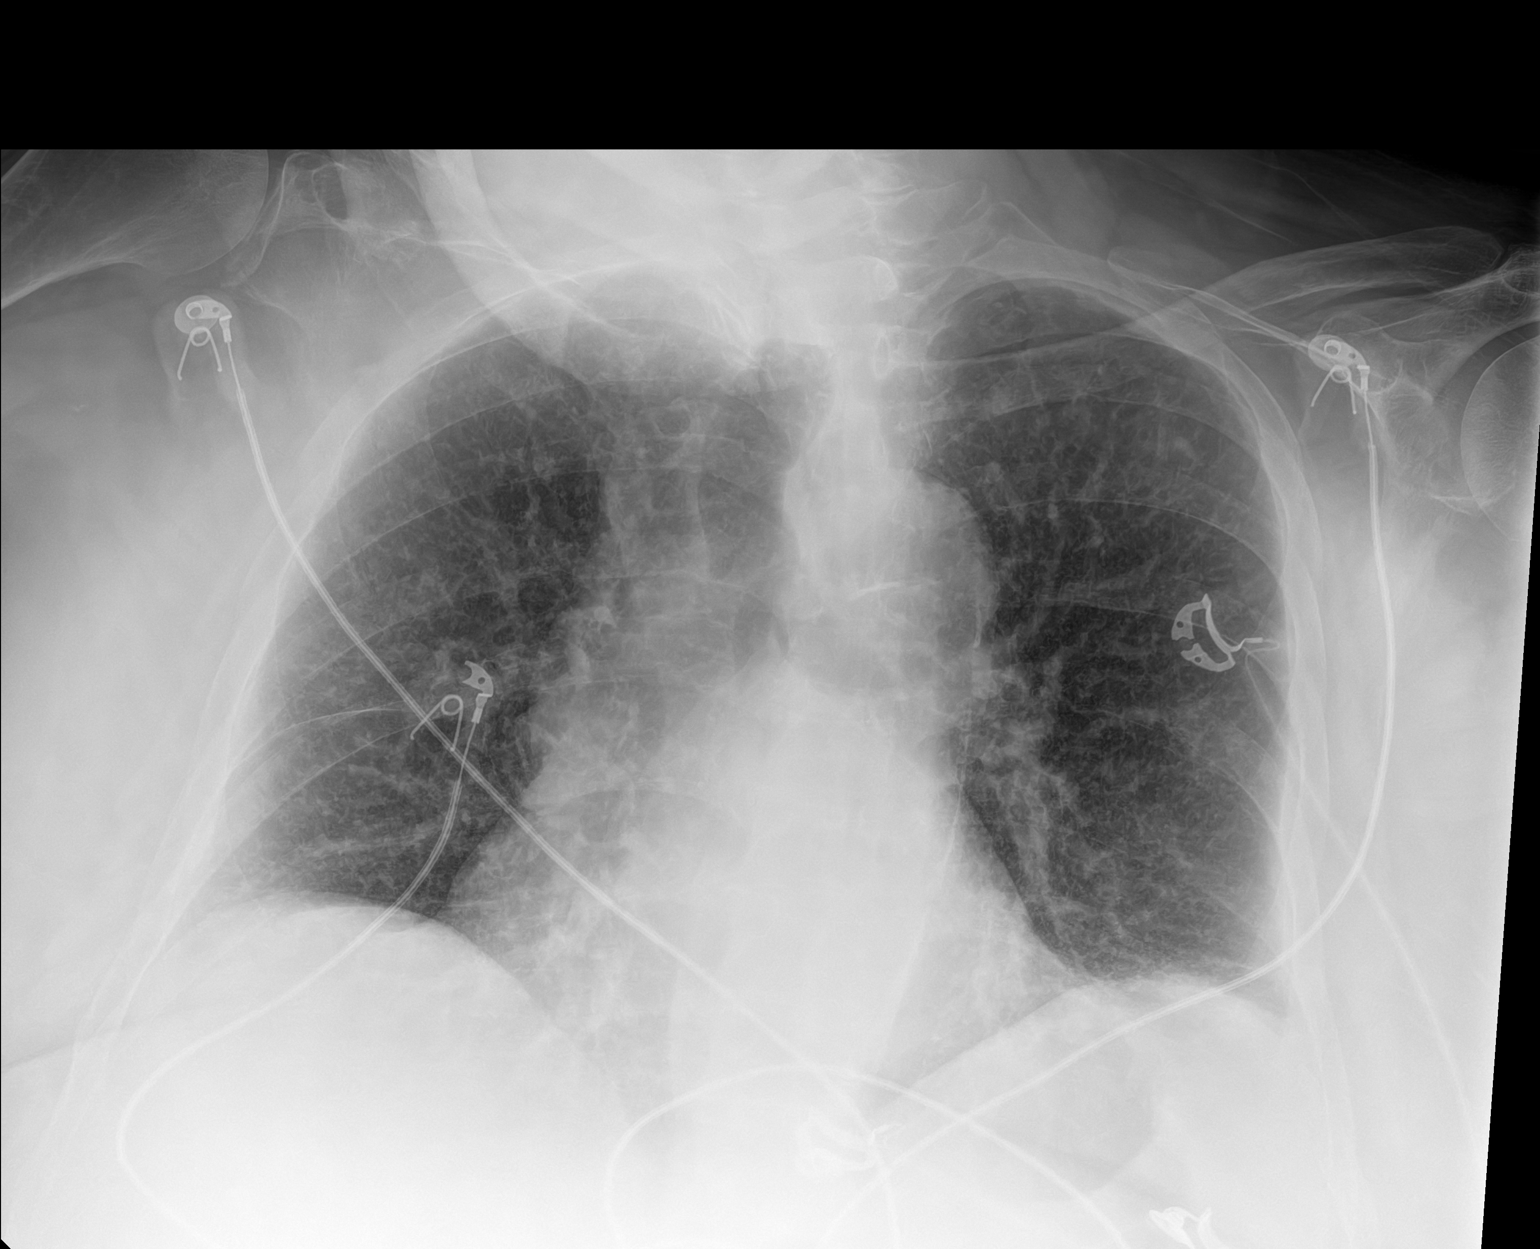

[1 of 1 positions shown; findings below may reference images not displayed]

FINDINGS: Stable low lung volumes. Patient is rotated. Stable upper normal
heart size. Aortic atherosclerosis and tortuosity. Chronic
interstitial coarsening, unchanged. No acute airspace disease. No
significant pleural effusion. No pneumothorax. Bones are diffusely
under mineralized.
IMPRESSION: Stable low lung volumes and chronic interstitial coarsening. No
acute abnormality.

Aortic Atherosclerosis ([id number].V).
